# Patient Record
Sex: Male | Born: 1950 | Hispanic: Yes | Marital: Married | Smoking: Former smoker
Health system: Southern US, Community
[De-identification: ages and names within clinical notes are randomized; demographics above are authoritative.]

## PROBLEM LIST (undated history)

## (undated) DIAGNOSIS — F1011 Alcohol abuse, in remission: Secondary | ICD-10-CM

## (undated) DIAGNOSIS — M1990 Unspecified osteoarthritis, unspecified site: Secondary | ICD-10-CM

## (undated) DIAGNOSIS — I10 Essential (primary) hypertension: Secondary | ICD-10-CM

## (undated) DIAGNOSIS — E119 Type 2 diabetes mellitus without complications: Secondary | ICD-10-CM

## (undated) DIAGNOSIS — Z5189 Encounter for other specified aftercare: Secondary | ICD-10-CM

## (undated) HISTORY — DX: Essential (primary) hypertension: I10

## (undated) HISTORY — DX: Encounter for other specified aftercare: Z51.89

## (undated) HISTORY — DX: Alcohol abuse, in remission: F10.11

## (undated) HISTORY — DX: Type 2 diabetes mellitus without complications: E11.9

---

## 2008-11-08 HISTORY — PX: BACK SURGERY: SHX140

## 2014-06-17 ENCOUNTER — Encounter: Payer: Self-pay | Admitting: Medical

## 2014-06-17 ENCOUNTER — Ambulatory Visit (INDEPENDENT_AMBULATORY_CARE_PROVIDER_SITE_OTHER): Payer: Medicare Other | Admitting: Medical

## 2014-06-17 VITALS — BP 140/78 | HR 60 | Temp 98.0°F | Ht 65.0 in | Wt 215.8 lb

## 2014-06-17 DIAGNOSIS — M545 Low back pain, unspecified: Secondary | ICD-10-CM

## 2014-06-17 DIAGNOSIS — M549 Dorsalgia, unspecified: Secondary | ICD-10-CM | POA: Insufficient documentation

## 2014-06-17 DIAGNOSIS — E785 Hyperlipidemia, unspecified: Secondary | ICD-10-CM

## 2014-06-17 DIAGNOSIS — M25552 Pain in left hip: Secondary | ICD-10-CM

## 2014-06-17 DIAGNOSIS — I10 Essential (primary) hypertension: Secondary | ICD-10-CM | POA: Insufficient documentation

## 2014-06-17 DIAGNOSIS — J45909 Unspecified asthma, uncomplicated: Secondary | ICD-10-CM | POA: Insufficient documentation

## 2014-06-17 DIAGNOSIS — M25559 Pain in unspecified hip: Secondary | ICD-10-CM | POA: Insufficient documentation

## 2014-06-17 DIAGNOSIS — E119 Type 2 diabetes mellitus without complications: Secondary | ICD-10-CM

## 2014-06-17 DIAGNOSIS — R350 Frequency of micturition: Secondary | ICD-10-CM

## 2014-06-17 MED ORDER — MELOXICAM 7.5 MG PO TABS
7.5000 mg | ORAL_TABLET | Freq: Every day | ORAL | Status: DC
Start: 2014-06-17 — End: 2014-07-02

## 2014-06-17 NOTE — Assessment & Plan Note (Signed)
Hx of recently but mild and during the day. No infection symptoms obvious. Did get poct ua and psa today. Will follow those results may give antibiotic and may rx med such as flomax.

## 2014-06-17 NOTE — Assessment & Plan Note (Signed)
Continue lipitor and on follow up exam for hip advised to come in fasting and can get lipid panel.

## 2014-06-17 NOTE — Assessment & Plan Note (Signed)
Hx of back pain with hx of surgery. Sometimes back pain associated with lt hip pain so did order lumbar spine xray.

## 2014-06-17 NOTE — Assessment & Plan Note (Signed)
Pt bp is controlled when I rechecked it was 130/70. Continue ace inhibitor.

## 2014-06-17 NOTE — Assessment & Plan Note (Signed)
Pt is on metformin and NPH insulin. Will get cmp and a1-c. Follow closely. And make recommendation when labs back.

## 2014-06-17 NOTE — Progress Notes (Signed)
Subjective:    Patient ID: Samuel [name], male    DOB: 1951-03-30, 63 y.o.   MRN: [id number]  HPI New pt with acute concern. Bilateral hip pain. Effects his balance. He tripped 2 times and fell 3 months ago. He states hurts a lot on left side. Pt states in  got therapy in past with prior MD. Pt states one year ago xray. Pt states lumbar xray. 5 yrs ago. Back surgery and historyof back pain. 3 years pain in hips  Have hurt overall but recently  worse. Pt has used naprosyn in the past. Did not help much and his prior pcp discouraged [place] of nsaids although he never had history of ulcers or gastritis.  Pt has diabetes. Pt states 15 yrs diagnosis. Pt on metfromin. This am 102 bs. Pt states 221. Pt states he can't remember last a1-c. He does not no what that is. Pt states uses NPH 80 units am. 70 units in pm.  Bp 130/78. No Ha, no chest pain and not gross motor/sensory function deficitis.  Pt has hyperlipidemia. He is not fasting today. No side effects reported from lipid med.  He reports history of asthma but no reported wheezing  Pt does not remember any psa. Difficulty urinating occasinally during day but none at night. No fever, no chills, no back or perineum pain.   5 yrs ago had colosocpy. Negative/normal. Last pneumovaccine-?    Review of Systems  Constitutional: Negative for fever, chills and fatigue.  HENT: Negative.   Respiratory: Negative for cough, choking and wheezing.        Hs of asthma but no recent wheezing.  Endocrine: Negative for polydipsia, polyphagia and polyuria.  Genitourinary: Positive for urgency and frequency. Negative for hematuria, decreased urine volume, penile swelling, scrotal swelling, enuresis, difficulty urinating and testicular pain.       Mild sometimes during the day. But not at night.  Musculoskeletal:       Lt hip pain moderate-severe. Rt hip pain mild-moderate. Some chronic low back pain that he experiences sometimes with lt hip pain.  Neurological:  Negative for dizziness, tremors, seizures, syncope, speech difficulty, weakness, light-headedness, numbness and headaches.       Feels off balance acutely. He will have pain and then describes this throws him off balance.  Hematological: Negative for adenopathy. Does not bruise/bleed easily.  Psychiatric/Behavioral: Negative.        Objective:   Physical Exam  Constitutional: He is oriented to person, place, and time. He appears well-developed and well-nourished. No distress.  Pleasant friendly pt. Obese.  HENT:  Head: Normocephalic and atraumatic.  Eyes: Conjunctivae are normal. Pupils are equal, round, and reactive to light.  Neck: Normal range of motion. Neck supple. No JVD present. No tracheal deviation present. No thyromegaly present.  Cardiovascular: Normal rate, regular rhythm and normal heart sounds.  Exam reveals no gallop and no friction rub.   No murmur heard. Pulmonary/Chest: Effort normal and breath sounds normal. No stridor. No respiratory distress. He has no wheezes. He has no rales. He exhibits no tenderness.  Abdominal: Soft. Bowel sounds are normal. He exhibits no distension and no mass. There is no tenderness. There is no rebound and no guarding.  He does have protuberant abdomen.  Genitourinary: Rectum normal, prostate normal and penis normal. No penile tenderness.  Musculoskeletal: Normal range of motion. He exhibits tenderness. He exhibits no edema.  Mild mid lspine pain on palpation. Lt hip- pain on rom. No crepitus. Rt hip- full rom.  No obvious pain.  Lymphadenopathy:    He has no cervical adenopathy.  Neurological: He is alert and oriented to person, place, and time. He has normal reflexes. No cranial nerve deficit. Coordination normal.  Skin: He is not diaphoretic.            Assessment & Plan:

## 2014-06-17 NOTE — Patient Instructions (Addendum)
For your history of back pain and hip pain will get lumbar spine xray and xray of lt hip. Will give prescription of diclofenac for your hip pain.(Stop otc nsaids.) For your frequent urination I want to get a psa. And get urine dip. When labs are back may prescribe flomax. For your diabetes we will check labs today. Please sign release of information form today so we can get old records. Follow up in 2 wks or as needed.

## 2014-06-17 NOTE — Assessment & Plan Note (Signed)
Has pain on both sides but lt is more prominent than right side. So did get xray of that side today.

## 2014-06-17 NOTE — Assessment & Plan Note (Addendum)
Hx of asthma. Recently stable. New pt. Will follow and see if needs inhalers.

## 2014-06-18 LAB — CBC WITH DIFFERENTIAL/PLATELET
BASOS ABS: 0 10*3/uL (ref 0.0–0.1)
BASOS PCT: 0.4 % (ref 0.0–3.0)
EOS ABS: 0.1 10*3/uL (ref 0.0–0.7)
Eosinophils Relative: 1.8 % (ref 0.0–5.0)
HCT: 41.6 % (ref 39.0–52.0)
Hemoglobin: 14.1 g/dL (ref 13.0–17.0)
Lymphocytes Relative: 31.7 % (ref 12.0–46.0)
Lymphs Abs: 2.3 10*3/uL (ref 0.7–4.0)
MCHC: 34 g/dL (ref 30.0–36.0)
MCV: 87.4 fl (ref 78.0–100.0)
MONO ABS: 0.5 10*3/uL (ref 0.1–1.0)
Monocytes Relative: 6.6 % (ref 3.0–12.0)
NEUTROS ABS: 4.3 10*3/uL (ref 1.4–7.7)
NEUTROS PCT: 59.5 % (ref 43.0–77.0)
Platelets: 211 10*3/uL (ref 150.0–400.0)
RBC: 4.76 Mil/uL (ref 4.22–5.81)
RDW: 13.7 % (ref 11.5–15.5)
WBC: 7.2 10*3/uL (ref 4.0–10.5)

## 2014-06-18 LAB — COMPREHENSIVE METABOLIC PANEL
ALK PHOS: 56 U/L (ref 39–117)
ALT: 39 U/L (ref 0–53)
AST: 32 U/L (ref 0–37)
Albumin: 3.9 g/dL (ref 3.5–5.2)
BUN: 22 mg/dL (ref 6–23)
CO2: 24 mEq/L (ref 19–32)
CREATININE: 0.7 mg/dL (ref 0.4–1.5)
Calcium: 9.2 mg/dL (ref 8.4–10.5)
Chloride: 101 mEq/L (ref 96–112)
GFR: 113.55 mL/min (ref >60.00)
Glucose, Bld: 181 mg/dL — ABNORMAL HIGH (ref 70–99)
Potassium: 4.2 mEq/L (ref 3.5–5.1)
Sodium: 138 mEq/L (ref 135–145)
Total Bilirubin: 0.5 mg/dL (ref 0.2–1.2)
Total Protein: 6.7 g/dL (ref 6.0–8.3)

## 2014-06-18 LAB — POCT URINALYSIS DIPSTICK
BILIRUBIN UA: NEGATIVE
Blood, UA: NEGATIVE
Ketones, UA: NEGATIVE
Leukocytes, UA: NEGATIVE
NITRITE UA: NEGATIVE
Protein, UA: NEGATIVE
Spec Grav, UA: 1.02
Urobilinogen, UA: 0.2
pH, UA: 6.5

## 2014-06-18 LAB — HEMOGLOBIN A1C: Hgb A1c MFr Bld: 9.7 % — ABNORMAL HIGH (ref 4.6–6.5)

## 2014-06-18 LAB — PSA: PSA: 0.32 ng/mL (ref 0.10–4.00)

## 2014-06-24 ENCOUNTER — Telehealth: Payer: Self-pay | Admitting: Medical

## 2014-06-24 NOTE — Telephone Encounter (Signed)
I did talk with patient and his daughter. When talked with patient had bad reception. So when daughter called back I explained labs. Particularly expalined a1-c high. I advised to have better diet(Low blood sugar) and exercise. He is on nph 80 units insulin q am and 70 units q pm. Advised can increase insulin  slowly by one unit in am and one pm each day. But continue to check  bs to see if coming down. I advised that if on records his a1-c has always been this high in past consistently then would refer him to endocrinology. Pt will come in about 7-10 days regarding his prior complaint.

## 2014-07-02 ENCOUNTER — Ambulatory Visit (INDEPENDENT_AMBULATORY_CARE_PROVIDER_SITE_OTHER): Payer: Medicare Other | Admitting: Medical

## 2014-07-02 ENCOUNTER — Encounter: Payer: Self-pay | Admitting: Medical

## 2014-07-02 VITALS — BP 130/70 | HR 62 | Temp 97.9°F | Ht 64.6 in | Wt 214.0 lb

## 2014-07-02 DIAGNOSIS — E119 Type 2 diabetes mellitus without complications: Secondary | ICD-10-CM

## 2014-07-02 DIAGNOSIS — M25559 Pain in unspecified hip: Secondary | ICD-10-CM

## 2014-07-02 DIAGNOSIS — R350 Frequency of micturition: Secondary | ICD-10-CM

## 2014-07-02 DIAGNOSIS — M549 Dorsalgia, unspecified: Secondary | ICD-10-CM

## 2014-07-02 DIAGNOSIS — R29898 Other symptoms and signs involving the musculoskeletal system: Secondary | ICD-10-CM

## 2014-07-02 DIAGNOSIS — M25552 Pain in left hip: Secondary | ICD-10-CM

## 2014-07-02 DIAGNOSIS — M5489 Other dorsalgia: Secondary | ICD-10-CM

## 2014-07-02 MED ORDER — MELOXICAM 7.5 MG PO TABS: 7.5000 mg | ORAL_TABLET | Freq: Every day | ORAL | Status: DC

## 2014-07-02 NOTE — Progress Notes (Signed)
Pre visit review using our clinic review tool, if applicable. No additional management support is needed unless otherwise documented below in the visit note. 

## 2014-07-02 NOTE — Assessment & Plan Note (Addendum)
Pt never got lumbar xray. I saw his prior MD notes. Received some old records. November 2014 MRI ordered. But I did not find report. With recent events of daily back pain with losing complete strength  In legs and collapsing will go ahead and order MRI. Continue meloxicam.

## 2014-07-02 NOTE — Assessment & Plan Note (Signed)
Recently improved. PSA was normal.

## 2014-07-02 NOTE — Patient Instructions (Signed)
For you left hip pain I want you to get xray of left hip. Continue on meloxicam. For your back I will try to get old lumbar mri. If I am unable to get then will go ahead and repeat. Continue insulin and diabetic diet. If on repeat a1-c in November your a1-c is above 8 will refer to endocrinologist. Follow up November 10th as needed.

## 2014-07-02 NOTE — Assessment & Plan Note (Signed)
Will get xray of lt hip. Rx of meloxicam.

## 2014-07-02 NOTE — Progress Notes (Signed)
   Subjective:    Patient ID: Samuel [name], male    DOB: [birth year], 63 y.o.   MRN: [id number]  HPI   Pt states last month had 2 events where his rt leg felt very weak. No numbness to legs. No back pain when this happens. One time in [place]  12 years ago randomly fell down. No history of incontinence. No report of saddle anesthesia.   Lt hip pain some crepitus sound per pt. Pain is minimal but often present.   He has lower back lumbar pain with pain day and night. Pain radiating to lt leg. Hard to sleep due to pain. Pt states mri one yr or more ago and told good mri.   Pt states using insulin 80 units am.70 unitis pm.He admits he not eating real healthy. Pt is concerned about low bs based on one event low bs. I asked him by phone  to incrementally increase insulin by one unit each day for both morning and night doses. He did not do this in fact sometimes he states he does not take his insulin at night.His a1-c was 9.7.   Review of Systems  Constitutional: Negative for fever, chills and fatigue.  HENT: Negative.   Respiratory: Negative for choking, chest tightness and wheezing.   Cardiovascular: Negative for chest pain and palpitations.  Genitourinary: Negative for urgency, frequency, flank pain, discharge, difficulty urinating, penile pain and testicular pain.  Musculoskeletal: Positive for back pain.       Lt hip pain  Skin: Negative.   Neurological: Positive for weakness.       Random leg weakness.  Hematological: Negative for adenopathy. Does not bruise/bleed easily.           Objective:   Physical Exam  Constitutional: He is oriented to person, place, and time. He appears well-developed and well-nourished. No distress.  Pleasant friendly pt. Obese.  HENT:  Head: Normocephalic and atraumatic.  Eyes: Conjunctivae are normal. Pupils are equal, round, and reactive to light.  Neck: Normal range of motion. Neck supple. No JVD present. No tracheal deviation present. No  thyromegaly present.  Cardiovascular: Normal rate, regular rhythm and normal heart sounds.  Exam reveals no gallop and no friction rub.   No murmur heard. Pulmonary/Chest: Effort normal and breath sounds normal. No stridor. No respiratory distress. He has no wheezes. He has no rales. He exhibits no tenderness.  Abdominal: Soft. Bowel sounds are normal. He exhibits no distension and no mass. There is no tenderness. There is no rebound and no guarding.  He does have protuberant abdomen.  Musculoskeletal: Normal range of motion. He exhibits tenderness. He exhibits no edema.  Mild mid lspine pain on palpation. Lt hip- pain on rom. No crepitus. Rt hip- full rom. No obvious pain.  Lymphadenopathy:    He has no cervical adenopathy.  Neurological: He is alert and oriented to person, place, and time. He has normal reflexes. No cranial nerve deficit. Coordination normal.  L5-s1 sensation intact. Normal patellar reflexes. Equal 5/5 lower extremity strength. No foot drop.  Skin: He is not diaphoretic.         Assessment & Plan:

## 2014-07-02 NOTE — Assessment & Plan Note (Signed)
Pt admits not compliant with diet although in past he went to see 2 nutritionist so he states he knows how to eat just does not eat well. He also admits not taking insulin as prior pcp advised. I explained to him that if a1-c not controlled in early November that I would refer him to endocrinologist. I discussed idea of using basal insulin at night and meal time sliding and he expressed dislike with that plan.

## 2014-07-09 ENCOUNTER — Other Ambulatory Visit: Payer: Self-pay

## 2014-07-10 ENCOUNTER — Telehealth: Payer: Self-pay | Admitting: Medical

## 2014-07-10 DIAGNOSIS — E119 Type 2 diabetes mellitus without complications: Secondary | ICD-10-CM

## 2014-07-10 NOTE — Telephone Encounter (Signed)
I have reviewed pt records in the past. His a1-c appears to have not been below 8 since 2012. Sometimes a1-c above 10 and usually in high 8's close to 9. Also some history of sporadic hypoglycemic events. Recent a1-c with [place] was 9.7. He is on high units of insulin as well. I discussed this with him today and explained I think referral to endocrinologist is best. I was agreeable to this. So I will go ahead and make that referral.

## 2014-07-22 ENCOUNTER — Ambulatory Visit (HOSPITAL_BASED_OUTPATIENT_CLINIC_OR_DEPARTMENT_OTHER)
Admission: RE | Admit: 2014-07-22 | Discharge: 2014-07-22 | Disposition: A | Discharge status: Home / Self Care | Payer: Medicare Other | Source: Ambulatory Visit | Attending: Medical | Admitting: Medical

## 2014-07-22 ENCOUNTER — Telehealth: Payer: Self-pay | Admitting: Medical

## 2014-07-22 DIAGNOSIS — M25559 Pain in unspecified hip: Secondary | ICD-10-CM | POA: Diagnosis not present

## 2014-07-22 DIAGNOSIS — M545 Low back pain, unspecified: Secondary | ICD-10-CM | POA: Insufficient documentation

## 2014-07-22 DIAGNOSIS — M25552 Pain in left hip: Secondary | ICD-10-CM

## 2014-07-22 NOTE — Telephone Encounter (Signed)
Will notify pt of lumbar xray results and hip xray when  Notify him on mri appointment.

## 2014-07-24 ENCOUNTER — Ambulatory Visit (INDEPENDENT_AMBULATORY_CARE_PROVIDER_SITE_OTHER): Payer: Medicare Other | Admitting: Endocrinology

## 2014-07-24 ENCOUNTER — Other Ambulatory Visit: Payer: Self-pay | Admitting: *Deleted

## 2014-07-24 ENCOUNTER — Encounter: Payer: Self-pay | Admitting: Endocrinology

## 2014-07-24 ENCOUNTER — Encounter: Payer: Medicare Other | Attending: Endocrinology | Admitting: Nutrition

## 2014-07-24 VITALS — BP 131/74 | HR 75 | Temp 98.1°F | Resp 16 | Ht 64.5 in | Wt 215.0 lb

## 2014-07-24 DIAGNOSIS — E785 Hyperlipidemia, unspecified: Secondary | ICD-10-CM

## 2014-07-24 DIAGNOSIS — Z794 Long term (current) use of insulin: Secondary | ICD-10-CM | POA: Diagnosis not present

## 2014-07-24 DIAGNOSIS — E119 Type 2 diabetes mellitus without complications: Secondary | ICD-10-CM | POA: Diagnosis not present

## 2014-07-24 DIAGNOSIS — E1165 Type 2 diabetes mellitus with hyperglycemia: Principal | ICD-10-CM

## 2014-07-24 DIAGNOSIS — Z713 Dietary counseling and surveillance: Secondary | ICD-10-CM | POA: Diagnosis not present

## 2014-07-24 DIAGNOSIS — I10 Essential (primary) hypertension: Secondary | ICD-10-CM

## 2014-07-24 DIAGNOSIS — E669 Obesity, unspecified: Secondary | ICD-10-CM

## 2014-07-24 DIAGNOSIS — IMO0001 Reserved for inherently not codable concepts without codable children: Secondary | ICD-10-CM

## 2014-07-24 MED ORDER — LIRAGLUTIDE 18 MG/3ML ~~LOC~~ SOPN: PEN_INJECTOR | SUBCUTANEOUS | Status: DC

## 2014-07-24 MED ORDER — INSULIN PEN NEEDLE 31G X 5 MM MISC: Status: DC

## 2014-07-24 NOTE — Patient Instructions (Addendum)
Please check blood sugars at least half the time about 2 hours after any meal including breakfast and lunch and 3-4 times a week as directed on waking up. Please bring blood sugar monitor to each visit  EVENING insulin to be taken 30 minutes before eating preferably. Start taking 30 units tonight and take the same dose regardless of the blood sugar Continue 80 units of insulin before breakfast. If the blood sugar is getting low during the day may reduce the dose to 70 units  Start VICTOZA injection with the sample pen once daily at the same time of the day preferably at bedtime.  Dial the dose to 0.6 mg for the first week.   You may  experience nausea in the first few days which usually gets better the After 1 week increase the dose to 1.2mg  daily if no nausea.    You will feel fullness of the stomach with starting the medication and should try to keep portions of food small.    Walk daily Reduce portions of carbohydrates and high fat foods

## 2014-07-24 NOTE — Patient Instructions (Signed)
Take  Victoza once a day at bedtime Start with 0.6 for 7 days, and if no nausea, increase the dose to 1.2. Read over starter kit on the medications and call if questions. Take evening dose of insulin before the supper meal.

## 2014-07-24 NOTE — Progress Notes (Signed)
Patient ID: Samuel [name], male   DOB: 16-Mar-1951, 63 y.o.   MRN: [id number]            Reason for Appointment: Consultation for Type 2 Diabetes  Referring physician: [place]  History of Present Illness:          Diagnosis: Type 2 diabetes mellitus, date of diagnosis:  1990      Past history: He thinks he has been on insulin for the last 10-12 years. Previously had been on metformin which has been continued. He was probably tried on different insulin regimens initially but has been taking 70/30 mostly because of cost Previous records are not available and appears that his sugars have been poorly controlled for several years  Recent history:  He has been referred here because of an A1c in 8/15 of 9.7%. He does not know what his previous levels have been He is checking his blood sugars and keeping a diary. However checking the blood sugar only when he is taking his insulin twice a day Currently he is taking his insulin just before breakfast in the morning and about an hour after his evening meal He would adjust his evening insulin based on the postprandial blood sugar and will take usually 30-60 units and occasionally will skip the insulin His blood sugars in the mornings are relatively good but usually are high at night and had some fluctuation He has a tendency to low sugars during the night periodically when he would get very shaky and will drink juice       Oral hypoglycemic drugs the patient is taking are:   metformin     Side effects from medications have been:none INSULIN regimen is described as:  Novolin 70/30: 80 acb; 30-60 pcs  Compliance with the medical regimen: Fair Hypoglycemia:  4 am   Glucose monitoring:  done twice a day         Glucometer: One Touch.      Blood Glucose readings by time of day and averages from    PREMEAL Breakfast Lunch pcs Bedtime  Overall   Glucose range: 66-137  174-321    Median:        Self-care: The diet that the patient has been following  is: None, not controlling portions and appears to have relatively high carbohydrate meals especially breakfast    Meals: 3 meals per day. Breakfast is various kinds of bread, milk and eggs         Exercise: Walking about 1 mile, 3 days a week in the evenings           Dietician visit, most recent: 5 years ago.               Weight history: 200-220  Wt Readings from Last 3 Encounters:  07/24/14 215 lb (97.523 kg)  07/02/14 214 lb (97.07 kg)  06/17/14 215 lb 12.8 oz (97.886 kg)    Glycemic control:   Lab Results  Component Value Date   HGBA1C 9.7* 06/17/2014   Lab Results  Component Value Date   CREATININE 0.7 06/17/2014         Medication List       This list is accurate as of: 07/24/14  2:32 PM.  Always use your most recent med list.               atorvastatin 40 MG tablet  Commonly known as:  LIPITOR  Take 40 mg by mouth daily. Take 1/2 tablet at bedtime  cholecalciferol 1000 UNITS tablet  Commonly known as:  VITAMIN D  Take 1,000 Units by mouth daily.     insulin NPH-regular Human (70-30) 100 UNIT/ML injection  Commonly known as:  NOVOLIN 70/30  Inject into the skin 2 (two) times daily with a meal. 80u in morning 70u in the evening     lisinopril 20 MG tablet  Commonly known as:  PRINIVIL,ZESTRIL  Take 20 mg by mouth daily.     meloxicam 7.5 MG tablet  Commonly known as:  MOBIC  Take 1 tablet (7.5 mg total) by mouth daily.     metFORMIN 1000 MG tablet  Commonly known as:  GLUCOPHAGE  Take 1,000 mg by mouth 2 (two) times daily with a meal.        Allergies: No Known Allergies  Past Medical History  Diagnosis Date  . Hypertension   . Diabetes mellitus without complication   . Blood transfusion without reported diagnosis   . History of ETOH abuse     Past Surgical History  Procedure Laterality Date  . Back surgery N/A 2010    Family History  Problem Relation Age of Onset  . Diabetes    . Hypertension    . Arthritis Father   . Diabetes  Father     Social History:  reports that he has quit smoking. He quit smokeless tobacco use about 20 years ago. He reports that he does not drink alcohol or use illicit drugs.    Review of Systems       Vision is normal. Most recent eye exam was in 3/15, reportedly normal       Lipids:  He has been on Lipitor for 3-4 years, no recent labs available       No results found for this basename: CHOL, HDL, LDLCALC, LDLDIRECT, TRIG, CHOLHDL                  Skin: No rash or infections     Thyroid:  No  unusual fatigue.     The blood pressure has been high for 2 years, treated with lisinopril     No swelling of feet.     No shortness of breath or chest tightness  on exertion.     Bowel habits: Normal.      Has had some joint  pains.          No history of Numbness, tingling or burning in feet, occasionally has sensitivity of the top of his feet       Physical Examination:  BP 131/74  Pulse 75  Temp(Src) 98.1 F (36.7 C)  Resp 16  Ht 5' 4.5" (1.638 m)  Wt 215 lb (97.523 kg)  BMI 36.35 kg/m2  SpO2 97%  GENERAL:         Patient has generalized obesity.   HEENT:         Eye exam shows normal external appearance. Fundus exam shows no retinopathy. Oral exam shows normal mucosa .  NECK:         General:  Neck exam shows no lymphadenopathy. Carotids are normal to palpation and no bruit heard.  Thyroid is not enlarged and no nodules felt.   LUNGS:         Chest is symmetrical. Lungs are clear to auscultation.[name]   HEART:         Heart sounds:  S1 and S2 are normal. No murmurs or clicks heard., no S3 or S4.   ABDOMEN:  There is no distention present. Abdomen is obese. Liver and spleen are not palpable. No other mass or tenderness present.  EXTREMITIES:     There is no edema. No skin lesions present.[name]  NEUROLOGICAL:   Vibration sense is moderately reduced in toes. Ankle jerks are absent bilaterally.           Diabetic foot exam shows normal monofilament sensation in the toes and  plantar surfaces, no skin lesions or ulcers on the feet and normal pedal pulses MUSCULOSKELETAL:       There is no enlargement or deformity of the joints. Spine is normal to inspection.[name]   SKIN:       No rash or lesions of concern.        ASSESSMENT:  Diabetes type 2, uncontrolled with obesity and BMI of 36 He is also significantly insulin resistant as he is taking about 150 units of insulin a day with poor control despite taking metformin Discussed that it is important for him to lose weight for multiple benefits especially reduced insulin resistance    Discussed with the patient the nature of GLP-1 drugs, the action on various organ systems, how they benefit blood glucose control, as well as the benefit of weight loss and  increase satiety . Explained possible side effects especially nausea and vomiting; discussed safety information in package insert.  INSULIN doses: Currently he is taking his evening dose inappropriately after eating and adjusting it based on postprandial reading and discussed the actions of premixed insulin and timing of this insulin Also not clear if he may need to split his morning dose to smaller dose at breakfast and lunch Currently he is not monitoring his blood sugars after breakfast and lunch and not clear what his blood sugar patterns are He may also be able to control glucose better by using a basal bolus regimen of Lantus and NovoLog but cost may be a factor  Complications: None evident, needs to have microalbumin checked  History of hyperlipidemia: Needs to be evaluated with lipid levels  HYPERTENSION: Appears well controlled  PLAN:  The nurse educator instructed him on injection technique and dosage titration of Victoza  starting with 0.6 mg once a day at the same time for the first week and then increasing to 1.2 mg if no symptoms of nausea. Patient brochure on Victoza and co-pay card given Increase frequency of walking Start checking blood sugars more  consistently midmorning and midafternoon also periodically Change suppertime dose to 30 minutes before eating and start with 30 units He will bring his monitor for download and review of blood sugar patterns in about 3 weeks He was given written instructions including translation in Spanish Consultation with dietitian  Counseling time over 50% of today's 25 minute visit  Jalan Bodi 07/24/2014, 2:32 PM   Note: This office note was prepared with Dragon voice recognition system technology. Any transcriptional errors that result from this process are unintentional.

## 2014-07-24 NOTE — Progress Notes (Signed)
We discussed how the [place] works and he was shown how to inject this medication.  He has never used a pen, and he redemonstrated how to attach the needle and draw up the dose.  He is injection his insulin into his upper thighs and abdomen area.  We review the different sites he can use to inject this Victoza and the need to rotate those sites daily.  He reported good understanding of this.   We also discussed how to dose this medication.  He was given a Victoza starter kit, with directions on how to increase the dose after 7 day, as well as how to use this pen.   He reported good understanding of this and had no final questions.  He was told to have his daughter give me a call if he, or she has questions about this medication.    We also reviewed the need to take his evening dose of insulin before his supper meal.  He reported good understanding of this.

## 2014-07-27 ENCOUNTER — Ambulatory Visit (HOSPITAL_BASED_OUTPATIENT_CLINIC_OR_DEPARTMENT_OTHER)
Admission: RE | Admit: 2014-07-27 | Discharge: 2014-07-27 | Disposition: A | Discharge status: Home / Self Care | Payer: Medicare Other | Source: Ambulatory Visit | Attending: Medical | Admitting: Medical

## 2014-07-27 DIAGNOSIS — M79609 Pain in unspecified limb: Secondary | ICD-10-CM | POA: Diagnosis not present

## 2014-07-27 DIAGNOSIS — M51379 Other intervertebral disc degeneration, lumbosacral region without mention of lumbar back pain or lower extremity pain: Secondary | ICD-10-CM | POA: Insufficient documentation

## 2014-07-27 DIAGNOSIS — IMO0002 Reserved for concepts with insufficient information to code with codable children: Secondary | ICD-10-CM | POA: Diagnosis not present

## 2014-07-27 DIAGNOSIS — M545 Low back pain, unspecified: Secondary | ICD-10-CM | POA: Diagnosis present

## 2014-07-27 DIAGNOSIS — R29898 Other symptoms and signs involving the musculoskeletal system: Secondary | ICD-10-CM

## 2014-07-27 DIAGNOSIS — M5137 Other intervertebral disc degeneration, lumbosacral region: Secondary | ICD-10-CM | POA: Insufficient documentation

## 2014-07-27 DIAGNOSIS — M25559 Pain in unspecified hip: Secondary | ICD-10-CM | POA: Insufficient documentation

## 2014-07-27 DIAGNOSIS — M5489 Other dorsalgia: Secondary | ICD-10-CM

## 2014-07-27 DIAGNOSIS — M47817 Spondylosis without myelopathy or radiculopathy, lumbosacral region: Secondary | ICD-10-CM | POA: Diagnosis not present

## 2014-08-02 ENCOUNTER — Telehealth: Payer: Self-pay | Admitting: Medical

## 2014-08-02 NOTE — Telephone Encounter (Signed)
I discussed lumbar mri results with him. He is reporting knee pain recently and sometimes legs feel week. His back really is not bothering him recently. I offered appointment and he willing to come in. Will talk with receptionist and schedule him for next week. Will have spanish speaking staff call and notify him appointment date.

## 2014-08-03 ENCOUNTER — Ambulatory Visit (HOSPITAL_BASED_OUTPATIENT_CLINIC_OR_DEPARTMENT_OTHER): Payer: Medicare Other

## 2014-08-08 ENCOUNTER — Ambulatory Visit: Payer: Medicare Other | Admitting: Medical

## 2014-08-08 ENCOUNTER — Telehealth: Payer: Self-pay | Admitting: *Deleted

## 2014-08-08 NOTE — Telephone Encounter (Signed)
Pt did not show for appointment 08/08/2014 at 9am for knee pain

## 2014-08-14 ENCOUNTER — Other Ambulatory Visit: Payer: Medicare Other

## 2014-08-19 ENCOUNTER — Encounter: Payer: Self-pay | Admitting: Endocrinology

## 2014-08-19 ENCOUNTER — Ambulatory Visit (INDEPENDENT_AMBULATORY_CARE_PROVIDER_SITE_OTHER): Payer: Medicare Other | Admitting: Endocrinology

## 2014-08-19 VITALS — BP 140/74 | HR 78 | Temp 98.0°F | Resp 16 | Ht 64.5 in | Wt 211.6 lb

## 2014-08-19 DIAGNOSIS — IMO0002 Reserved for concepts with insufficient information to code with codable children: Secondary | ICD-10-CM

## 2014-08-19 DIAGNOSIS — Z23 Encounter for immunization: Secondary | ICD-10-CM

## 2014-08-19 DIAGNOSIS — E1165 Type 2 diabetes mellitus with hyperglycemia: Secondary | ICD-10-CM

## 2014-08-19 DIAGNOSIS — E785 Hyperlipidemia, unspecified: Secondary | ICD-10-CM

## 2014-08-19 DIAGNOSIS — I10 Essential (primary) hypertension: Secondary | ICD-10-CM

## 2014-08-19 NOTE — Patient Instructions (Addendum)
Victoza 1.2mg  daily  INSULIN 85 UNITS AND ONLY REDUCE to 75 units if am sugar < 90  Take 55 units before dinner daily  Please check blood sugars at least half the time about 2 hours after any meal and times per week on waking up. Please bring blood sugar monitor to each visit  Call if sugar getting low

## 2014-08-19 NOTE — Progress Notes (Signed)
Patient ID: Samuel [name], male   DOB: 01/06/1951, 63 y.o.   MRN: [id number]            Reason for Appointment:  Followup for Type 2 Diabetes  Referring physician: [place]  History of Present Illness:          Diagnosis: Type 2 diabetes mellitus, date of diagnosis:  1990      Past history: He thinks he has been on insulin for the last 10-12 years. Previously had been on metformin which has been continued. He was probably tried on different insulin regimens initially but has been taking 70/30 mostly because of cost Previous records are not available and appears that his sugars have been poorly controlled for several years  Recent history:  He has been referred here because of an A1c in 8/15 of 9.7%. He had been on a regimen of Novolin 70/30 twice a day but was taking his evening dose based on the postprandial blood sugar He would also not take his insulin if his blood sugars would be normal or relatively low He would tend to have fluctuating blood sugars after dinner but occasional nocturnal hypoglycemia He is checking his blood sugars with a generic monitor and keeping a diary.   He was given instructions on how to take his insulin but he has not followed this. Still taking his evening insulin based on blood sugar before eating and also may sometimes give the morning dose He was started on Victoza because of his obesity and poor control but he is taking only 0.6 mg despite instructions in Spanish No recent labs are available       Oral hypoglycemic drugs the patient is taking are:   metformin     Side effects from medications have been:none INSULIN regimen is described as:  Novolin 70/30: 80 acb; 60-70 acs  Compliance with the medical regimen: Fair Hypoglycemia:   twice in the last month  Glucose monitoring:  done twice a day         Glucometer:  True Result     Blood Glucose readings by time of day by review of blood sugar diary  PREMEAL Breakfast Lunch pcs Bedtime  Overall     Glucose range: 89-193  94-214 163   Median:        Self-care: The diet that the patient has been following is: None, not controlling portions and appears to have relatively high carbohydrate meals especially breakfast    Meals: 3 meals per day. Breakfast is various kinds of bread, milk and eggs         Exercise: Walking about 1 mile, 3 days a week in the evenings           Dietician visit, most recent: 5 years ago.               Weight history: 200-220  Wt Readings from Last 3 Encounters:  08/19/14 211 lb 9.6 oz (95.981 kg)  07/24/14 215 lb (97.523 kg)  07/02/14 214 lb (97.07 kg)    Glycemic control:   Lab Results  Component Value Date   HGBA1C 9.7* 06/17/2014   Lab Results  Component Value Date   CREATININE 0.7 06/17/2014         Medication List       This list is accurate as of: 08/19/14  3:41 PM.  Always use your most recent med list.               atorvastatin 40 MG  tablet  Commonly known as:  LIPITOR  Take 40 mg by mouth daily. Take 1/2 tablet at bedtime     cholecalciferol 1000 UNITS tablet  Commonly known as:  VITAMIN D  Take 1,000 Units by mouth daily.     insulin NPH-regular Human (70-30) 100 UNIT/ML injection  Commonly known as:  NOVOLIN 70/30  Inject into the skin 2 (two) times daily with a meal. 80u in morning 70u in the evening     Insulin Pen Needle 31G X 5 MM Misc  Use once daily with victoza     Liraglutide 18 MG/3ML Sopn  Commonly known as:  VICTOZA  Inject 0.6 mg daily for the first week, then increase to 1.2 daily     lisinopril 20 MG tablet  Commonly known as:  PRINIVIL,ZESTRIL  Take 20 mg by mouth daily.     meloxicam 7.5 MG tablet  Commonly known as:  MOBIC  Take 1 tablet (7.5 mg total) by mouth daily.     metFORMIN 1000 MG tablet  Commonly known as:  GLUCOPHAGE  Take 1,000 mg by mouth 2 (two) times daily with a meal.        Allergies: No Known Allergies  Past Medical History  Diagnosis Date  . Hypertension   .  Diabetes mellitus without complication   . Blood transfusion without reported diagnosis   . History of ETOH abuse     Past Surgical History  Procedure Laterality Date  . Back surgery N/A 2010    Family History  Problem Relation Age of Onset  . Diabetes    . Hypertension    . Arthritis Father   . Diabetes Father     Social History:  reports that he has quit smoking. He quit smokeless tobacco use about 20 years ago. He reports that he does not drink alcohol or use illicit drugs.    Review of Systems       Vision is normal. Most recent eye exam was in 3/15, reportedly normal       Lipids:  He has been on Lipitor for 3-4 years, no recent labs available       No results found for this basename: CHOL,  HDL,  LDLCALC,  LDLDIRECT,  TRIG,  CHOLHDL       The blood pressure has been high for 2 years, treated with lisinopril          No history of Numbness, tingling or burning in feet, occasionally has sensitivity of the top of his feet      Physical Examination:  BP 140/74  Pulse 78  Temp(Src) 98 F (36.7 C)  Resp 16  Ht 5' 4.5" (1.638 m)  Wt 211 lb 9.6 oz (95.981 kg)  BMI 35.77 kg/m2  SpO2 97%        ASSESSMENT:  Diabetes type 2, uncontrolled with obesity and BMI of 36 Although his blood sugars appear to be relatively better he is checking them primarily before eating only He was given detailed instructions on glucose monitoring, timing of insulin and adjustment as well as titration of Victoza dose but he has not followed these instructions Still taking large doses of premixed insulin twice a day  HYPERTENSION: Well controlled  Hyperlipidemia: Needs followup lipids, this will be done on his followup visit   PLAN:  He was advised him to titration of Victoza and possible side effects Reduce his evening dose Advised him to keep his insulin doses consistent unless he is having hypoglycemia especially  early morning and review of blood sugars again on followup  visit He will not skip his insulin doses if blood sugars are normal More regular exercise He was advised to come back in one month but he cannot afford this and will come back in 2 months Check microalbumin on the next visit  He will start using the One Touch monitor and not in a fasting and postprandial readings He will bring his monitor for download and review of blood sugar patterns in about 3 weeks He was given written instructions including translation in Spanish Consultation with dietitian  needs to be done  Patient Instructions  Victoza 1.2mg  daily  INSULIN 85 UNITS AND ONLY REDUCE to 75 units if am sugar < 90  Take 55 units before dinner daily  Please check blood sugars at least half the time about 2 hours after any meal and times per week on waking up. Please bring blood sugar monitor to each visit  Call if sugar getting low    Counseling time over 50% of today's 25 minute visit  Meryem Haertel 08/19/2014, 3:41 PM   Note: This office note was prepared with Insurance underwriterDragon voice recognition system technology. Any transcriptional errors that result from this process are unintentional.

## 2014-09-03 ENCOUNTER — Telehealth: Payer: Self-pay | Admitting: Endocrinology

## 2014-09-03 ENCOUNTER — Other Ambulatory Visit: Payer: Self-pay | Admitting: *Deleted

## 2014-09-03 MED ORDER — "INSULIN SYRINGE-NEEDLE U-100 30G X 5/16"" 0.5 ML MISC": Status: DC

## 2014-09-03 NOTE — Telephone Encounter (Signed)
Patient need refill of insulin syringes, Small needles

## 2014-10-02 ENCOUNTER — Ambulatory Visit (INDEPENDENT_AMBULATORY_CARE_PROVIDER_SITE_OTHER): Payer: Medicare Other | Admitting: Medical

## 2014-10-02 ENCOUNTER — Encounter: Payer: Self-pay | Admitting: Medical

## 2014-10-02 VITALS — BP 144/79 | HR 66 | Temp 98.3°F | Ht 64.0 in | Wt 214.4 lb

## 2014-10-02 DIAGNOSIS — I10 Essential (primary) hypertension: Secondary | ICD-10-CM

## 2014-10-02 DIAGNOSIS — E785 Hyperlipidemia, unspecified: Secondary | ICD-10-CM

## 2014-10-02 DIAGNOSIS — M5410 Radiculopathy, site unspecified: Secondary | ICD-10-CM

## 2014-10-02 DIAGNOSIS — M5442 Lumbago with sciatica, left side: Secondary | ICD-10-CM

## 2014-10-02 DIAGNOSIS — M792 Neuralgia and neuritis, unspecified: Secondary | ICD-10-CM

## 2014-10-02 DIAGNOSIS — E119 Type 2 diabetes mellitus without complications: Secondary | ICD-10-CM

## 2014-10-02 MED ORDER — TRAMADOL HCL 50 MG PO TABS: 50.0000 mg | ORAL_TABLET | Freq: Three times a day (TID) | ORAL | Status: DC | PRN

## 2014-10-02 NOTE — Patient Instructions (Addendum)
For your diabetes. Continue to see the endocrinologist.  For your lipids, check cmp and lipid panel on Monday fasting. Will refill your lipid medication according to results.  For your back pain, I am prescribing tramadol. Also referring to neurosurgeon and pain management.  For your blood pressure check daily. If your average is above 140/90 then would need to adjust your bp medication dose  Follow up 3 months or as needed.

## 2014-10-02 NOTE — Assessment & Plan Note (Signed)
Patient is being seen by a endocrinologist. He is on metformin and insulin. I did do a diabetic foot exam today. And he will follow-up in the next couple weeks with the endocrinologist. No changes made today.

## 2014-10-02 NOTE — Progress Notes (Signed)
Pre visit review using our clinic review tool, if applicable. No additional management support is needed unless otherwise documented below in the visit note. 

## 2014-10-02 NOTE — Assessment & Plan Note (Signed)
Patient has a blood pressure cuff and asked him to check his blood pressure daily. If his blood pressure readings are 140/90 or higher then he will notify me and we will adjust his blood pressure medication. He'll continue his current regimen.

## 2014-10-02 NOTE — Assessment & Plan Note (Signed)
Some disc bulge in the lumbar spine region with some impingement. Since he does have daily pain that radiates to his left lower extremity I will go ahead and make referral to both pain management and neurosurgeon. Patient expresses a does not want surgery. But I do want to get a surgeon opinion since I believe he had prior surgery years ago.

## 2014-10-02 NOTE — Assessment & Plan Note (Signed)
I put an order for fasting lipid panel and CMP. He will get that done on Monday and I will review the labs and make some adjustment is on medications if needed.

## 2014-10-02 NOTE — Progress Notes (Signed)
   Subjective:    Patient ID: Samuel SalinesLuis Vanmeter, male    DOB: [birth year], 63 y.o.   MRN: [id number]  HPI  Pt in for follow up.  Pt is still going to endocrinologist. Also next month is go see the endocrinologist again. Recent bs values wheh he check his blood sugars are in the 100's.  Pt bp is slight high today. Pt has machine at home but never checks. Pt not sure who goal is. I advised him today I want his 130/80. He also admits to eating a lot of of salt and 4-5 cups coffee a day.  Pt still has pain in his left leg. Lower back/si area pain all the way to calf at times. Pt has some pain that is moderate uncomfortable daily bais. Prior surgery to his back. Pt does not want surgery again.  Patient has history of hyperlipidemia and he is not fasting today.    Review of Systems  Constitutional: Negative for fever, chills and fatigue.  HENT: Negative for congestion, ear discharge, ear pain, nosebleeds, postnasal drip, rhinorrhea, sinus pressure, sneezing, sore throat and trouble swallowing.   Respiratory: Negative for cough, chest tightness, shortness of breath and wheezing.   Cardiovascular: Negative for chest pain and palpitations.  Gastrointestinal: Negative for nausea, abdominal pain, diarrhea and rectal pain.  Endocrine: Negative for polydipsia, polyphagia and polyuria.  Musculoskeletal: Positive for back pain.  Neurological: Negative for dizziness, tremors, seizures, syncope, facial asymmetry, weakness, light-headedness, numbness and headaches.       Some radiating pain from his left SI region down to the lateral aspect of his left calf.  Hematological: Negative for adenopathy. Does not bruise/bleed easily.  Psychiatric/Behavioral: Negative for suicidal ideas, behavioral problems, self-injury and dysphoric mood. The patient is not nervous/anxious.        Objective:   Physical Exam   General Mental Status- Alert. General Appearance- Not in acute distress.   Skin General: Color-  Normal Color. Moisture- Normal Moisture.  Neck Carotid Arteries- Normal color. Moisture- Normal Moisture. No carotid bruits. No JVD.  Chest and Lung Exam Auscultation: Breath Sounds:-Normal.  Cardiovascular Auscultation:Rythm- Regular. Murmurs & Other Heart Sounds:Auscultation of the heart reveals- No Murmurs.  Abdomen Inspection:-Inspeection Normal. But has obese protuberant abdomen. Palpation/Percussion:Note:No mass. Palpation and Percussion of the abdomen reveal- Non Tender, Non Distended + BS, no rebound or guarding.    Neurologic Cranial Nerve exam:- CN III-XII intact(No nystagmus), symmetric smile. Drift Test:- No drift. Romberg Exam:- Negative.  Heal to Toe Gait exam:-Normal. Finger to Nose:- Normal/Intact Strength:- 5/5 equal and symmetric strength both upper and lower extremities. Feet normal. Lt si tender.  Lower extremity neuro-L5-S1 sensation intact bilaterally. Normal patellar reflexes. No foot drop.  Back-mild mid lumbar sacral tenderness to palpation. Left SI area is tender to palpation as well.          Assessment & Plan:

## 2014-10-09 ENCOUNTER — Other Ambulatory Visit: Payer: Medicare Other

## 2014-10-11 ENCOUNTER — Other Ambulatory Visit (INDEPENDENT_AMBULATORY_CARE_PROVIDER_SITE_OTHER): Payer: Medicare Other

## 2014-10-11 DIAGNOSIS — E785 Hyperlipidemia, unspecified: Secondary | ICD-10-CM

## 2014-10-13 LAB — COMPREHENSIVE METABOLIC PANEL
ALT: 32 U/L (ref 0–53)
AST: 33 U/L (ref 0–37)
Albumin: 4 g/dL (ref 3.5–5.2)
Alkaline Phosphatase: 40 U/L (ref 39–117)
BUN: 15 mg/dL (ref 6–23)
CO2: 26 mEq/L (ref 19–32)
Calcium: 9.3 mg/dL (ref 8.4–10.5)
Chloride: 101 mEq/L (ref 96–112)
Creatinine, Ser: 0.9 mg/dL (ref 0.4–1.5)
GFR: 94.11 mL/min (ref >60.00)
Glucose, Bld: 166 mg/dL — ABNORMAL HIGH (ref 70–99)
Potassium: 4.4 mEq/L (ref 3.5–5.1)
SODIUM: 138 meq/L (ref 135–145)
TOTAL PROTEIN: 6.8 g/dL (ref 6.0–8.3)
Total Bilirubin: 0.8 mg/dL (ref 0.2–1.2)

## 2014-10-13 LAB — LIPID PANEL
CHOL/HDL RATIO: 2
Cholesterol: 99 mg/dL (ref 0–200)
HDL: 43.9 mg/dL (ref >39.00)
LDL Cholesterol: 32 mg/dL (ref 0–99)
NONHDL: 55.1
Triglycerides: 115 mg/dL (ref 0.0–149.0)
VLDL: 23 mg/dL (ref 0.0–40.0)

## 2014-10-14 ENCOUNTER — Telehealth: Payer: Self-pay | Admitting: Medical

## 2014-10-14 MED ORDER — ATORVASTATIN CALCIUM 40 MG PO TABS: ORAL_TABLET | ORAL | Status: DC

## 2014-10-14 NOTE — Telephone Encounter (Signed)
I called pt with lab results cholesterol. No answer. So left message stating his cholesterol levels are very good. I will send rx to his pharmacy/refill lipitor same dose.

## 2014-10-15 ENCOUNTER — Encounter: Payer: Medicare Other | Attending: Endocrinology | Admitting: Nutrition

## 2014-10-15 ENCOUNTER — Other Ambulatory Visit: Payer: Self-pay | Admitting: *Deleted

## 2014-10-15 ENCOUNTER — Encounter: Payer: Self-pay | Admitting: Endocrinology

## 2014-10-15 ENCOUNTER — Ambulatory Visit (INDEPENDENT_AMBULATORY_CARE_PROVIDER_SITE_OTHER): Payer: Medicare Other | Admitting: Endocrinology

## 2014-10-15 VITALS — BP 148/82 | HR 60 | Temp 98.5°F | Resp 14 | Ht 64.0 in | Wt 212.8 lb

## 2014-10-15 DIAGNOSIS — E119 Type 2 diabetes mellitus without complications: Secondary | ICD-10-CM | POA: Insufficient documentation

## 2014-10-15 DIAGNOSIS — E785 Hyperlipidemia, unspecified: Secondary | ICD-10-CM

## 2014-10-15 DIAGNOSIS — E669 Obesity, unspecified: Secondary | ICD-10-CM | POA: Insufficient documentation

## 2014-10-15 DIAGNOSIS — E1165 Type 2 diabetes mellitus with hyperglycemia: Secondary | ICD-10-CM | POA: Insufficient documentation

## 2014-10-15 DIAGNOSIS — I10 Essential (primary) hypertension: Secondary | ICD-10-CM | POA: Insufficient documentation

## 2014-10-15 DIAGNOSIS — IMO0002 Reserved for concepts with insufficient information to code with codable children: Secondary | ICD-10-CM

## 2014-10-15 DIAGNOSIS — Z713 Dietary counseling and surveillance: Secondary | ICD-10-CM | POA: Insufficient documentation

## 2014-10-15 DIAGNOSIS — Z794 Long term (current) use of insulin: Secondary | ICD-10-CM | POA: Insufficient documentation

## 2014-10-15 DIAGNOSIS — Z79899 Other long term (current) drug therapy: Secondary | ICD-10-CM | POA: Insufficient documentation

## 2014-10-15 DIAGNOSIS — Z7982 Long term (current) use of aspirin: Secondary | ICD-10-CM | POA: Insufficient documentation

## 2014-10-15 DIAGNOSIS — Z6836 Body mass index (BMI) 36.0-36.9, adult: Secondary | ICD-10-CM | POA: Insufficient documentation

## 2014-10-15 MED ORDER — INSULIN GLARGINE 300 UNIT/ML ~~LOC~~ SOPN: 80.0000 [IU] | PEN_INJECTOR | Freq: Every day | SUBCUTANEOUS | Status: DC

## 2014-10-15 MED ORDER — INSULIN NPH (HUMAN) (ISOPHANE) 100 UNIT/ML ~~LOC~~ SUSP: SUBCUTANEOUS | Status: DC

## 2014-10-15 MED ORDER — EXENATIDE ER 2 MG ~~LOC~~ SUSR: 2.0000 mg | SUBCUTANEOUS | Status: DC

## 2014-10-15 NOTE — Progress Notes (Signed)
Patient ID: Samuel [name], male   DOB: Jun 10, 1951, 63 y.o.   MRN: [id number]            Reason for Appointment:  Followup for Type 2 Diabetes  Referring physician: [place]  History of Present Illness:          Diagnosis: Type 2 diabetes mellitus, date of diagnosis:  1990      Past history: He thinks he has been on insulin for the last 10-12 years. Previously had been on metformin which has been continued. He was probably tried on different insulin regimens initially but has been taking 70/30 mostly because of cost Previous records are not available and appears that his sugars have been poorly controlled for several years He has been referred here because of an A1c in 8/15 of 9.7%.  Recent history:  He  Has been on a regimen of Novolin 70/30 twice a day  For simplicity and lower-cost  However even with increasing his  Doses and making sure he takes the insulin about 30 minutes before eating his blood sugars are still not controlled  He has been checking blood sugars mostly in the mornings.    Although he has now started using the One Touch monitor he has done readings after lunch and supper more sporadically.  He is only entering the fasting blood sugars and his diarrhea recently   Fasting blood sugars are usually excellent but he has readings almost always over 200 after meals and blood sugars appear to be progressively high in the afternoon and evening  He was started on Victoza because of his obesity and poor control but he did not increase the dose beyond 0.6 as instructed on the last visit. He thinks that he has dizziness with this medication although not everyday and some nausea.  Is not taking this now   He has difficulty losing weight despite doing some walking       Oral hypoglycemic drugs the patient is taking are:   metformin 1 g twice a day     Side effects from medications have been: ?  Nausea/dizziness on Victoza INSULIN regimen is described as:  Novolin 70/30: 80 acb; 60-70  acs  Compliance with the medical regimen: Fair Hypoglycemia:  twice in the last month  Glucose monitoring:  done twice a day         Glucometer:  True Result     Blood Glucose readings by time of day by review of blood sugar diary  PRE-MEAL Breakfast  9-10 AM  Dinner Bedtime Overall  Glucose range:  62-166   164-259   164-293   225-373    Mean/median:  118     270   164    Self-care: The diet that the patient has been following is: None, not controlling portions and may have carbohydrate meals especially breakfast    Meals: 3 meals per day. Breakfast is various kinds of bread, milk and eggs         Exercise: Walking about 1 mile, 3 days a week in the evenings           Dietician visit, most recent: 5 years ago.               Weight history: 200-220  Wt Readings from Last 3 Encounters:  10/15/14 212 lb 12.8 oz (96.525 kg)  10/02/14 214 lb 6.4 oz (97.251 kg)  08/19/14 211 lb 9.6 oz (95.981 kg)    Glycemic control:   Lab Results  Component Value Date   HGBA1C 9.7* 06/17/2014   Lab Results  Component Value Date   LDLCALC 32 10/11/2014   CREATININE 0.9 10/11/2014         Medication List       This list is accurate as of: 10/15/14 11:59 PM.  Always use your most recent med list.               aspirin 325 MG tablet  Take 325 mg by mouth daily.     atorvastatin 40 MG tablet  Commonly known as:  LIPITOR  Take 40 mg by mouth daily. Take 1/2 tablet at bedtime     cholecalciferol 1000 UNITS tablet  Commonly known as:  VITAMIN D  Take 1,000 Units by mouth daily.     Vitamin D3 1000 UNITS Caps     Exenatide ER 2 MG Susr  Commonly known as:  BYDUREON  Inject 2 mg into the skin once a week.     Insulin Glargine 300 UNIT/ML Sopn  Commonly known as:  TOUJEO SOLOSTAR  Inject 80 Units into the skin daily. 80 units daily, every 5 days go up 5 units if blood sugar over 130     insulin NPH Human 100 UNIT/ML injection  Commonly known as:  HUMULIN N  Inject 25 units at  breakfast, 35 units at lunch and 30 units at dinner     Insulin Pen Needle 31G X 5 MM Misc  Use once daily with victoza     Insulin Syringe-Needle U-100 30G X 5/16" 0.5 ML Misc  Use to inject insulin     INSULIN SYRINGE 1CC/30GX5/16" 30G X 5/16" 1 ML Misc     Liraglutide 18 MG/3ML Sopn  Commonly known as:  VICTOZA  Inject 0.6 mg daily for the first week, then increase to 1.2 daily     lisinopril 20 MG tablet  Commonly known as:  PRINIVIL,ZESTRIL  Take 20 mg by mouth daily.     meloxicam 7.5 MG tablet  Commonly known as:  MOBIC     metFORMIN 1000 MG tablet  Commonly known as:  GLUCOPHAGE  Take 1,000 mg by mouth 2 (two) times daily with a meal.     ONE TOUCH ULTRA TEST test strip  Generic drug:  glucose blood     traMADol 50 MG tablet  Commonly known as:  ULTRAM  Take 1 tablet (50 mg total) by mouth every 8 (eight) hours as needed.        Allergies: No Known Allergies  Past Medical History  Diagnosis Date  . Hypertension   . Diabetes mellitus without complication   . Blood transfusion without reported diagnosis   . History of ETOH abuse     Past Surgical History  Procedure Laterality Date  . Back surgery N/A 2010    Family History  Problem Relation Age of Onset  . Diabetes    . Hypertension    . Arthritis Father   . Diabetes Father     Social History:  reports that he has quit smoking. He quit smokeless tobacco use about 20 years ago. He reports that he does not drink alcohol or use illicit drugs.    Review of Systems       Vision is normal. Most recent eye exam was in 3/15, reportedly normal       Lipids:  He has been on Lipitor for 3-4 years with good control       Lab Results  Component Value  Date   CHOL 99 10/11/2014   HDL 43.90 10/11/2014   LDLCALC 32 10/11/2014   TRIG 115.0 10/11/2014   CHOLHDL 2 10/11/2014       The blood pressure has been high for 2 years, treated with lisinopril with fair control         Physical  Examination:  BP 148/82 mmHg  Pulse 60  Temp(Src) 98.5 F (36.9 C)  Resp 14  Ht 5\' 4"  (1.626 m)  Wt 212 lb 12.8 oz (96.525 kg)  BMI 36.51 kg/m2  SpO2 97%     no ankle edema present    ASSESSMENT:  Diabetes type 2, uncontrolled with obesity and BMI of 36 Although his blood sugars are well controlled in the morning they are progressively higher after meals during the day Most of his postprandial readings are over 200 Discussed with the patient that his regimen of premixed insulin twice a day will not keep his blood sugars controlled and he needs rapid acting mealtime insulin with every meal Also he has not been able to take Victoza possibly because of side effects and again has not taken this as directed He has difficulty losing weight and will benefit from a GLP-1 drug He is checking blood sugars more often in the morning and less after meals and also not keeping a record of these  HYPERTENSION: Blood pressure is high normal today  Hyperlipidemia: Excellent control   PLAN:  He was instructed on changing his insulin to basal bolus using Toujeo and Regular Insulin Discussed actions of basal insulin and also mealtime insulin.  Discussed timing and adjustment of both the doses He was also referred to the nurse educator for more detailed education He was instructed on how to use a Bydureon pen which is covered by his insurance and this should cause less nausea. Encouraged him to watch his carbohydrate intake better and increase exercise as tolerated More blood sugars after meals He will bring his monitor for download and review of blood sugar patterns in one month He was given written instructions including translation in Spanish Consultation with dietitian   Patient Instructions  Toujeo insulin 80 units in am daily and after every 5 days may go up by 5 units if the morning sugars are still over 130  REGULAR INSULIN: This is fast acting insulin to cover you meals.  Prefer to take  this about 30 minutes before eating Take 25 units before breakfast, 35 units before lunch and 30 units before dinner  Check blood sugar consistently at various times including 2 hours after meals, on average twice a day  Start Bydureon once a week and take it the same day of the week.  May cause a little nausea in the first 2-3 days If it will help you cut back on portions of meals  Avoid large portions of starchy foods    Counseling time over 50% of today's 25 minute visit  Edmonia Gonser 10/16/2014, 12:35 PM   Note: This office note was prepared with Insurance underwriterDragon voice recognition system technology. Any transcriptional errors that result from this process are unintentional.

## 2014-10-15 NOTE — Patient Instructions (Addendum)
Toujeo insulin 80 units in am daily and after every 5 days may go up by 5 units if the morning sugars are still over 130  REGULAR INSULIN: This is fast acting insulin to cover you meals.  Prefer to take this about 30 minutes before eating Take 25 units before breakfast, 35 units before lunch and 30 units before dinner  Check blood sugar consistently at various times including 2 hours after meals, on average twice a day  Start Bydureon once a week and take it the same day of the week.  May cause a little nausea in the first 2-3 days If it will help you cut back on portions of meals  Avoid large portions of starchy foods

## 2014-10-16 ENCOUNTER — Encounter: Payer: Medicare Other | Admitting: Nutrition

## 2014-10-16 DIAGNOSIS — Z7982 Long term (current) use of aspirin: Secondary | ICD-10-CM | POA: Diagnosis not present

## 2014-10-16 DIAGNOSIS — E669 Obesity, unspecified: Secondary | ICD-10-CM | POA: Diagnosis not present

## 2014-10-16 DIAGNOSIS — E785 Hyperlipidemia, unspecified: Secondary | ICD-10-CM | POA: Diagnosis not present

## 2014-10-16 DIAGNOSIS — E119 Type 2 diabetes mellitus without complications: Secondary | ICD-10-CM | POA: Diagnosis present

## 2014-10-16 DIAGNOSIS — Z79899 Other long term (current) drug therapy: Secondary | ICD-10-CM | POA: Diagnosis not present

## 2014-10-16 DIAGNOSIS — Z6836 Body mass index (BMI) 36.0-36.9, adult: Secondary | ICD-10-CM | POA: Diagnosis not present

## 2014-10-16 DIAGNOSIS — E1165 Type 2 diabetes mellitus with hyperglycemia: Secondary | ICD-10-CM | POA: Diagnosis not present

## 2014-10-16 DIAGNOSIS — Z794 Long term (current) use of insulin: Secondary | ICD-10-CM | POA: Diagnosis not present

## 2014-10-16 DIAGNOSIS — Z713 Dietary counseling and surveillance: Secondary | ICD-10-CM | POA: Diagnosis not present

## 2014-10-16 DIAGNOSIS — I10 Essential (primary) hypertension: Secondary | ICD-10-CM | POA: Diagnosis not present

## 2014-10-16 NOTE — Patient Instructions (Addendum)
Take Bydureon injection once a week. Take Toujeo once a day. Take Regular insulin 30 min. Before all meals

## 2014-10-16 NOTE — Progress Notes (Signed)
Samuel [name] was instructed on the use of the Bydureon pen.  He was shown the steps to mixing the pen and how to attach the needle and the need to inject this medication once a week.  He reported good understanding of this and had no final questions.   We also reviewed how to use the Toujeo pen and the need to take this insulin once a day-at the same time each day.  We also reviewed the need for the Regular insulin, and the need to take this 30 min.   before each meal.  We reviewed all of these doses and he was given a written record of this and highled the doses and times to take each of these.  He reported good understanding of this and had no final questions.

## 2014-10-17 ENCOUNTER — Ambulatory Visit: Payer: Medicare Other | Admitting: Endocrinology

## 2014-11-13 ENCOUNTER — Ambulatory Visit (INDEPENDENT_AMBULATORY_CARE_PROVIDER_SITE_OTHER): Payer: Medicare Other | Admitting: Endocrinology

## 2014-11-13 ENCOUNTER — Encounter: Payer: Self-pay | Admitting: Endocrinology

## 2014-11-13 VITALS — BP 122/57 | HR 58 | Temp 98.3°F | Resp 14 | Ht 64.0 in | Wt 214.0 lb

## 2014-11-13 DIAGNOSIS — IMO0002 Reserved for concepts with insufficient information to code with codable children: Secondary | ICD-10-CM

## 2014-11-13 DIAGNOSIS — E1165 Type 2 diabetes mellitus with hyperglycemia: Secondary | ICD-10-CM | POA: Diagnosis not present

## 2014-11-13 DIAGNOSIS — I10 Essential (primary) hypertension: Secondary | ICD-10-CM

## 2014-11-13 NOTE — Patient Instructions (Addendum)
Toujeo 70 units in am daily and if am sugar stays over 140 go up to 75  REGULAR INSULIN  30 units before breakfast, 45 units before lunch and ALSO before dinner  Call sugar readings in 1 week  Toujeo 70 unidades en la maana diaria y si Warden/rangermaana el azcar se mantiene ms de 140 ir hasta 75  INSULINA REGULAR 30 unidades antes del desayuno, 45 unidades antes del almuerzo y antes de la cena TAMBIN  Llame a las lecturas de International aid/development workerazcar en 1 semana

## 2014-11-13 NOTE — Progress Notes (Signed)
Patient ID: Samuel [name], male   DOB: 07-25-1951, 64 y.o.   MRN: [id number]            Reason for Appointment:  Followup for Type 2 Diabetes  Referring physician: [place]  History of Present Illness:          Diagnosis: Type 2 diabetes mellitus, date of diagnosis:  1990      Past history: He thinks he has been on insulin for the last 10-12 years. Previously had been on metformin which has been continued. He was probably tried on different insulin regimens initially but has been taking 70/30 mostly because of cost Previous records are not available and appears that his sugars have been poorly controlled for several years He has been referred here because of an A1c in 8/15 of 9.7%.  Recent history:  A previously was on a regimen of Novolin 70/30 twice a day but because of inadequate control and higher readings later in the day he was switched to basal bolus insulin regimen in 12/15. He has been able to get his Toujeo insulin as directed Also taking Bydureon without any side effects, was switched from Victoza. Current blood sugar patterns and problems:  Probably having a relatively high carbohydrate diet with rice and tortillas at most of his meals  Blood sugars are excellent in the morning without hypoglycemia except once when his glucose was 66.  He is taking his Toujeo at variable times of the day  Blood sugars are progressively higher later in the day and mostly over 200  He is mostly not taking his breakfast coverage as he is afraid to take it with normal fasting glucose  He will forget to take his insulin at mealtimes when he is eating out.  He did not understand that he can take the insulin out of the refrigerator for this.  He has no difficulty doing the Bydureon and has been taking this weekly; he thinks he is cutting back on portions  Despite his trying to do a little walking and taking Bydureon he has gained a little weight.       Oral hypoglycemic drugs the patient is  taking are:   metformin 1 g twice a day     Side effects from medications have been: ?  Nausea/dizziness on Victoza INSULIN regimen is described as:  Toujeo 80 acb;   25 units before breakfast, 35 units before lunch and 30 units before dinner  Compliance with the medical regimen: Fair  Hypoglycemia:  twice in the last month  Glucose monitoring:  done twice a day         Glucometer:  One Touch Verio     Blood Glucose readings by time of day by review of meter download  PRE-MEAL Breakfast Lunch Dinner Bedtime Overall  Glucose range:  66-162   219-281   262, 269   236-337    Mean/median:      222    POST-MEAL PC Breakfast PC Lunch PC Dinner  Glucose range:   215-332    Mean/median:      Self-care: The diet that the patient has been following is: None, not controlling portions and may have high carbohydrate meals especially breakfast    Meals: 3 meals per day. Breakfast is various kinds of bread, milk and eggs         Exercise: Walking about 1 mile, 3 days a week in the evenings           Dietician visit, most  recent: 5 years ago.               Weight history: 200-220  Wt Readings from Last 3 Encounters:  11/13/14 214 lb (97.07 kg)  10/15/14 212 lb 12.8 oz (96.525 kg)  10/02/14 214 lb 6.4 oz (97.251 kg)    Glycemic control:   Lab Results  Component Value Date   HGBA1C 9.7* 06/17/2014   Lab Results  Component Value Date   LDLCALC 32 10/11/2014   CREATININE 0.9 10/11/2014         Medication List       This list is accurate as of: 11/13/14 10:05 AM.  Always use your most recent med list.               aspirin 325 MG tablet  Take 325 mg by mouth daily.     atorvastatin 40 MG tablet  Commonly known as:  LIPITOR  Take 40 mg by mouth daily. Take 1/2 tablet at bedtime     cholecalciferol 1000 UNITS tablet  Commonly known as:  VITAMIN D  Take 1,000 Units by mouth daily.     Vitamin D3 1000 UNITS Caps     Exenatide ER 2 MG Susr  Commonly known as:  BYDUREON    Inject 2 mg into the skin once a week.     Insulin Glargine 300 UNIT/ML Sopn  Commonly known as:  TOUJEO SOLOSTAR  Inject 80 Units into the skin daily. 80 units daily, every 5 days go up 5 units if blood sugar over 130     insulin NPH Human 100 UNIT/ML injection  Commonly known as:  HUMULIN N  Inject 25 units at breakfast, 35 units at lunch and 30 units at dinner     Insulin Pen Needle 31G X 5 MM Misc  Use once daily with victoza     Insulin Syringe-Needle U-100 30G X 5/16" 0.5 ML Misc  Use to inject insulin     INSULIN SYRINGE 1CC/30GX5/16" 30G X 5/16" 1 ML Misc     Liraglutide 18 MG/3ML Sopn  Commonly known as:  VICTOZA  Inject 0.6 mg daily for the first week, then increase to 1.2 daily     lisinopril 20 MG tablet  Commonly known as:  PRINIVIL,ZESTRIL  Take 20 mg by mouth daily.     meloxicam 7.5 MG tablet  Commonly known as:  MOBIC     metFORMIN 1000 MG tablet  Commonly known as:  GLUCOPHAGE  Take 1,000 mg by mouth 2 (two) times daily with a meal.     ONE TOUCH ULTRA TEST test strip  Generic drug:  glucose blood     traMADol 50 MG tablet  Commonly known as:  ULTRAM  Take 1 tablet (50 mg total) by mouth every 8 (eight) hours as needed.        Allergies: No Known Allergies  Past Medical History  Diagnosis Date  . Hypertension   . Diabetes mellitus without complication   . Blood transfusion without reported diagnosis   . History of ETOH abuse     Past Surgical History  Procedure Laterality Date  . Back surgery N/A 2010    Family History  Problem Relation Age of Onset  . Diabetes    . Hypertension    . Arthritis Father   . Diabetes Father     Social History:  reports that he has quit smoking. He quit smokeless tobacco use about 21 years ago. He reports that he does  not drink alcohol or use illicit drugs.    Review of Systems       Vision is normal. Most recent eye exam was in 3/15, reportedly normal       Lipids:  He has been on Lipitor for  3-4 years with good control       Lab Results  Component Value Date   CHOL 99 10/11/2014   HDL 43.90 10/11/2014   LDLCALC 32 10/11/2014   TRIG 115.0 10/11/2014   CHOLHDL 2 10/11/2014       The blood pressure has been high for 2 years, treated with lisinopril with fair control         Physical Examination:  BP 122/57 mmHg  Pulse 58  Temp(Src) 98.3 F (36.8 C)  Resp 14  Ht 5\' 4"  (1.626 m)  Wt 214 lb (97.07 kg)  BMI 36.72 kg/m2  SpO2 98%     no ankle edema present    ASSESSMENT:  Diabetes type 2, uncontrolled with obesity and BMI of 36 Although his blood sugars are well controlled in the morning with Toujeo they are progressively higher after meals during the day Most of his postprandial readings are over 200 Again discussed that he needs to modify his diet with reduced carbohydrate intake He has difficulty understanding the timing of both the insulin doses Also not taking mealtime coverage at breakfast usually since he felt his blood sugar was normal before eating He can take his insulin with him when he is eating out which he is not doing currently.  HYPERTENSION: Blood pressure is good    PLAN:   He was instructed on timing of both the insulin doses  Discussed modification of diet  Discussed need for taking insulin proactively before eating to keep it from going up  Increase doses of regular insulin at lunch and dinner  Reduce Toujeo since he has occasional low normal sugars in the mornings and his suppertime dose will be increased.  Discussed how to titrate this  Patient Instructions  Toujeo 70 units in am daily and if am sugar stays over 140 go up to 75  REGULAR INSULIN  30 units before breakfast, 45 units before lunch and ALSO before dinner  Call sugar readings in 1 week  Toujeo 70 unidades en la maana diaria y si Warden/rangermaana el azcar se mantiene ms de 140 ir hasta 75  INSULINA REGULAR 30 unidades antes del desayuno, 45 unidades antes del almuerzo y  antes de la cena TAMBIN  Llame a las lecturas de azcar en 1 semana     Counseling time over 50% of today's 25 minute visit  Samuel [name] 11/13/2014, 10:05 AM   Note: This office note was prepared with Insurance underwriterDragon voice recognition system technology. Any transcriptional errors that result from this process are unintentional.

## 2014-12-10 ENCOUNTER — Other Ambulatory Visit: Payer: Self-pay | Admitting: *Deleted

## 2014-12-10 ENCOUNTER — Telehealth: Payer: Self-pay | Admitting: Endocrinology

## 2014-12-10 MED ORDER — METFORMIN HCL 1000 MG PO TABS: 1000.0000 mg | ORAL_TABLET | Freq: Two times a day (BID) | ORAL | Status: DC

## 2014-12-10 NOTE — Telephone Encounter (Signed)
rx sent

## 2014-12-10 NOTE — Telephone Encounter (Signed)
Patient need a refill of his Metformin 1000 mg

## 2014-12-12 ENCOUNTER — Ambulatory Visit: Payer: Medicaid Other | Admitting: Endocrinology

## 2015-01-02 ENCOUNTER — Encounter: Payer: Self-pay | Admitting: Medical

## 2015-01-02 ENCOUNTER — Ambulatory Visit (INDEPENDENT_AMBULATORY_CARE_PROVIDER_SITE_OTHER): Payer: Medicare Other | Admitting: Medical

## 2015-01-02 VITALS — BP 120/80 | HR 80 | Temp 98.4°F | Ht 64.0 in | Wt 211.2 lb

## 2015-01-02 DIAGNOSIS — I10 Essential (primary) hypertension: Secondary | ICD-10-CM

## 2015-01-02 DIAGNOSIS — R059 Cough, unspecified: Secondary | ICD-10-CM | POA: Insufficient documentation

## 2015-01-02 DIAGNOSIS — R05 Cough: Secondary | ICD-10-CM

## 2015-01-02 MED ORDER — LOSARTAN POTASSIUM 50 MG PO TABS: 50.0000 mg | ORAL_TABLET | Freq: Every day | ORAL | Status: DC

## 2015-01-02 MED ORDER — ATORVASTATIN CALCIUM 40 MG PO TABS: 40.0000 mg | ORAL_TABLET | Freq: Every day | ORAL | Status: DC

## 2015-01-02 NOTE — Patient Instructions (Signed)
Cough One month ago sounded like bronchitis and asthma. Now dry cough random and is possible side effect lisinopril. Will rx losartan in place of. Follow up in one month for bp check.   Essential hypertension, benign Controlled today. Unfortunately may have side effect of dry cough from lisniropril. Will change to losartan. Recheck bp one month.     Continue same meds for diabetes and lipids.  Follow on month or as needed.

## 2015-01-02 NOTE — Assessment & Plan Note (Signed)
One month ago sounded like bronchitis and asthma. Now dry cough random and is possible side effect lisinopril. Will rx losartan in place of. Follow up in one month for bp check.

## 2015-01-02 NOTE — Assessment & Plan Note (Signed)
Controlled today. Unfortunately may have side effect of dry cough from lisniropril. Will change to losartan. Recheck bp one month.

## 2015-01-02 NOTE — Progress Notes (Signed)
   Subjective:    Patient ID: Samuel [name] Doescher, male    DOB: Apr 03, 1951, 64 y.o.   MRN: [id number]  HPI    Pt see endocrinologist for his diabetes. Pt is on toujeo . And Regular insulin.(breakfast lunch and dinner)  I reviewed endnocrine note on 11-13-2014. note but don't see last a1-c. Pt has follow up on 12-12-2014 with eondocrine again.   Pt last lipid panel looked good. ldl was 32. This was 2 month ago. Pt complaint on atorvastatin and no side effects.  Pt bp is very good today. He has dx of htn. Was on lisinopril in the past.  Pt went to Hospital. Cough was severe. Almost made him choke. Pt told him probable bronchitis. They also treated with neb machine. Discharged with antibiotic and also one inhaler. Sounds like albuterol.  Pt is still taking lisinopril.   Still gets occasional dry cough. Went to ED about one month.     Review of Systems  Constitutional: Negative for fever, chills, diaphoresis, activity change and fatigue.  Respiratory: Negative for cough, chest tightness and shortness of breath.   Cardiovascular: Negative for chest pain, palpitations and leg swelling.  Gastrointestinal: Negative for nausea, vomiting and abdominal pain.  Musculoskeletal: Negative for neck pain and neck stiffness.  Neurological: Negative for dizziness, tremors, seizures, syncope, facial asymmetry, speech difficulty, weakness, light-headedness, numbness and headaches.  Psychiatric/Behavioral: Negative for behavioral problems, confusion and agitation. The patient is not nervous/anxious.        Objective:   Physical Exam  General Mental Status- Alert. General Appearance- Not in acute distress.   Skin General: Color- Normal Color. Moisture- Normal Moisture.  Neck Carotid Arteries- Normal color. Moisture- Normal Moisture. No carotid bruits. No JVD.  Chest and Lung Exam Auscultation: Breath Sounds:-Normal.  Cardiovascular Auscultation:Rythm- Regular. Murmurs & Other Heart  Sounds:Auscultation of the heart reveals- No Murmurs.  Abdomen Inspection:-Inspeection Normal. Palpation/Percussion:Note:No mass. Palpation and Percussion of the abdomen reveal- Non Tender, Non Distended + BS, no rebound or guarding.    Neurologic Cranial Nerve exam:- CN III-XII intact(No nystagmus), symmetric smile. Drift Test:- No drift. Romberg Exam:- Negative.  Heal to Toe Gait exam:-Normal. Finger to Nose:- Normal/Intact Strength:- 5/5 equal and symmetric strength both upper and lower extremities.      Assessment & Plan:

## 2015-01-02 NOTE — Progress Notes (Signed)
Pre visit review using our clinic review tool, if applicable. No additional management support is needed unless otherwise documented below in the visit note. 

## 2015-01-13 ENCOUNTER — Other Ambulatory Visit: Payer: Self-pay | Admitting: *Deleted

## 2015-01-13 ENCOUNTER — Encounter: Payer: Self-pay | Admitting: Endocrinology

## 2015-01-13 ENCOUNTER — Ambulatory Visit (INDEPENDENT_AMBULATORY_CARE_PROVIDER_SITE_OTHER): Payer: Medicare Other | Admitting: Endocrinology

## 2015-01-13 VITALS — BP 134/66 | HR 72 | Temp 98.2°F | Resp 14 | Ht 64.0 in | Wt 208.6 lb

## 2015-01-13 DIAGNOSIS — E1165 Type 2 diabetes mellitus with hyperglycemia: Secondary | ICD-10-CM | POA: Diagnosis not present

## 2015-01-13 DIAGNOSIS — I10 Essential (primary) hypertension: Secondary | ICD-10-CM

## 2015-01-13 DIAGNOSIS — IMO0002 Reserved for concepts with insufficient information to code with codable children: Secondary | ICD-10-CM

## 2015-01-13 MED ORDER — INSULIN REGULAR HUMAN 100 UNIT/ML IJ SOLN: INTRAMUSCULAR | Status: DC

## 2015-01-13 NOTE — Patient Instructions (Signed)
Toujeo 75 units in am daily and if am sugar stays over 140 go up to80  REGULAR INSULIN  25 units before breakfast, 35 units before lunch and ALSO before dinner    Toujeo 75 unidades en la maana diaria y si Warden/rangermaana el azcar se mantiene ms de 140 ir hasta 80  INSULINA REGULAR 25 unidades antes del desayuno, 35 unidades antes del almuerzo y antes de la cena TAMBIN  Llame a las lecturas de International aid/development workerazcar en 1 semana

## 2015-01-13 NOTE — Progress Notes (Signed)
Patient ID: Samuel [name], male   DOB: [birth year], 64 y.o.   MRN: [id number]            Reason for Appointment:  Followup for Type 2 Diabetes  Referring physician: [place]  History of Present Illness:          Diagnosis: Type 2 diabetes mellitus, date of diagnosis:  1990      Past history: He thinks he has been on insulin for the last 10-12 years. Previously had been on metformin which has been continued. He was probably tried on different insulin regimens initially but has been taking 70/30 mostly because of cost Previous records are not available and appears that his sugars have been poorly controlled for several years He was  referred here because of an A1c in 8/15 of 9.7%. He was on a regimen of Novolin 70/30 twice a day but because of inadequate control and higher readings later in the day he was switched to basal bolus insulin regimen in 12/15.  Recent history:   He has not followed any instructions that were given on his previous visit and has not taken any mealtime insulin when he ran out This is despite giving him clear-cut instructions both English and Spanish on his insulin doses Also he was told to continue Bydureon which was helping him cut back on portions but does not think he is taking this. Difficult to communicate with him despite interpreter Currently taking only Toujeo once a day in the morning. His weight is slightly better possibly from higher blood sugars but also he has been trying to walk more  Current blood sugar patterns and problems:  He is checking his blood sugars mostly before breakfast and supper and occasionally after evening meal only  Despite having glucose readings as high as 413 in the evening he does not understand the need for taking mealtime insulin  FASTING blood sugars are somewhat variable and occasionally over 200  No recent A1c available to judge his overall control  He has lost a little weight recently possibly from increasing  hyperglycemia  Still taking metformin.       Oral hypoglycemic drugs the patient is taking are:   metformin 1 g twice a day     Side effects from medications have been: ?  Nausea/dizziness on Victoza INSULIN regimen is described as:  Toujeo 80 acb; not taking Regular Insulin, previously on  25 units before breakfast, 35 units before lunch and 30 units before dinner  Compliance with the medical regimen: Poor  Hypoglycemia:   none except once at 6:30 AM with glucose 59  Glucose monitoring:  done twice a day         Glucometer:  One Touch ultra 2     Blood Glucose readings by time of day by review of meter download  PRE-MEAL Breakfast Lunch  3-6 PM   8-11 PM  Overall  Glucose range:  59-226   203   145-291   122-413    median:  144     210   191    Self-care: The diet that the patient has been following is: None, not controlling portions and may have high carbohydrate meals especially breakfast    Meals: 3 meals per day. Breakfast is various kinds of bread, milk and eggs. Diner at 6 pm         Exercise: Walking about 1 mile, 5-7 days a week in the evenings  Dietician visit, most recent: 5 years ago.               Weight history: 200-220  Wt Readings from Last 3 Encounters:  01/13/15 208 lb 9.6 oz (94.62 kg)  01/02/15 211 lb 3.2 oz (95.8 kg)  11/13/14 214 lb (97.07 kg)    Glycemic control:   Lab Results  Component Value Date   HGBA1C 9.2* 01/13/2015   HGBA1C 9.7* 06/17/2014   Lab Results  Component Value Date   LDLCALC 32 10/11/2014   CREATININE 0.89 01/13/2015         Medication List       This list is accurate as of: 01/13/15 11:59 PM.  Always use your most recent med list.               aspirin 325 MG tablet  Take 325 mg by mouth daily.     atorvastatin 40 MG tablet  Commonly known as:  LIPITOR  Take 1 tablet (40 mg total) by mouth daily. Take 1/2 tablet at bedtime     cholecalciferol 1000 UNITS tablet  Commonly known as:  VITAMIN D  Take 1,000  Units by mouth daily.     Vitamin D3 1000 UNITS Caps     Insulin Glargine 300 UNIT/ML Sopn  Commonly known as:  TOUJEO SOLOSTAR  Inject 80 Units into the skin daily. 80 units daily, every 5 days go up 5 units if blood sugar over 130     Insulin Pen Needle 31G X 5 MM Misc  Use once daily with victoza     insulin regular 100 units/mL injection  Commonly known as:  HUMULIN R  Inject 25 units at breakfast and 35 units at lunch and dinner     Insulin Syringe-Needle U-100 30G X 5/16" 0.5 ML Misc  Use to inject insulin     INSULIN SYRINGE 1CC/30GX5/16" 30G X 5/16" 1 ML Misc     lisinopril 20 MG tablet  Commonly known as:  PRINIVIL,ZESTRIL     losartan 50 MG tablet  Commonly known as:  COZAAR  Take 1 tablet (50 mg total) by mouth daily.     meloxicam 7.5 MG tablet  Commonly known as:  MOBIC     metFORMIN 1000 MG tablet  Commonly known as:  GLUCOPHAGE  Take 1 tablet (1,000 mg total) by mouth 2 (two) times daily with a meal.     ONE TOUCH ULTRA TEST test strip  Generic drug:  glucose blood     traMADol 50 MG tablet  Commonly known as:  ULTRAM  Take 1 tablet (50 mg total) by mouth every 8 (eight) hours as needed.        Allergies: No Known Allergies  Past Medical History  Diagnosis Date  . Hypertension   . Diabetes mellitus without complication   . Blood transfusion without reported diagnosis   . History of ETOH abuse     Past Surgical History  Procedure Laterality Date  . Back surgery N/A 2010    Family History  Problem Relation Age of Onset  . Diabetes    . Hypertension    . Arthritis Father   . Diabetes Father     Social History:  reports that he has quit smoking. He quit smokeless tobacco use about 21 years ago. He reports that he does not drink alcohol or use illicit drugs.    Review of Systems        Most recent eye exam was in  3/15, reportedly normal       Lipids:  He has been on Lipitor forsince about 2011 good control       Lab Results    Component Value Date   CHOL 99 10/11/2014   HDL 43.90 10/11/2014   LDLCALC 32 10/11/2014   TRIG 115.0 10/11/2014   CHOLHDL 2 10/11/2014       The blood pressure has been high for 2 years, treated with lisinopril   Last foot exam was in 07/2014        Physical Examination:  BP 134/66 mmHg  Pulse 72  Temp(Src) 98.2 F (36.8 C)  Resp 14  Ht 5\' 4"  (1.626 m)  Wt 208 lb 9.6 oz (94.62 kg)  BMI 35.79 kg/m2  SpO2 96%     ASSESSMENT:  Diabetes type 2, uncontrolled with obesity and BMI of 36 Although his blood sugars are on an average not as high as on his last visit he is only taking Toujeo He has difficulty understanding the need for controlling postprandial readings with regular insulin He has no explanation about why he stopped taking Regular Insulin on his own Discussed in detail the actions of regular insulin and need to cover postprandial hyperglycemia Also he may not consistent with diet as he has occasional readings over 200 overnight but also rare blood sugars as low as 50 9 in the morning Even with the interpreted is difficult to understand his difficulties and day-to-day management and he is probably not taking Bydureon although he gives contacting answers. Discussed need for consistent control because of potential for complications  HYPERTENSION: Blood pressure is normal    PLAN:   He was given printed instructions and reviewed these in detail including translation in Spanish  Have  instructed on timing of  Regular Insulin  Needs to restart Bydureon if he is not taking this  Check A1c today  May need to reduce his Toujeo and discussed how to adjust this based on fasting readings every 3-4 days   Schedule follow-up with nurse educator for detailed review of day-to-day management and help with understanding insulin  Patient Instructions  Toujeo 75 units in am daily and if am sugar stays over 140 go up to80  REGULAR INSULIN  25 units before breakfast, 35 units  before lunch and ALSO before dinner    Toujeo 75 unidades en la maana diaria y si Warden/rangermaana el azcar se mantiene ms de 140 ir hasta 80  INSULINA REGULAR 25 unidades antes del desayuno, 35 unidades antes del almuerzo y antes de la cena TAMBIN  Llame a las lecturas de International aid/development workerazcar en 1 semana    Counseling time over 50% of today's 25 minute visit  Zyonna Vardaman 01/14/2015, 4:07 PM   Note: This office note was prepared with Insurance underwriterDragon voice recognition system technology. Any transcriptional errors that result from this process are unintentional.

## 2015-01-14 LAB — BASIC METABOLIC PANEL
BUN: 21 mg/dL (ref 6–23)
CALCIUM: 9.2 mg/dL (ref 8.4–10.5)
CHLORIDE: 103 meq/L (ref 96–112)
CO2: 27 meq/L (ref 19–32)
Creatinine, Ser: 0.89 mg/dL (ref 0.40–1.50)
GFR: 91.6 mL/min (ref >60.00)
Glucose, Bld: 217 mg/dL — ABNORMAL HIGH (ref 70–99)
Potassium: 4.1 mEq/L (ref 3.5–5.1)
Sodium: 138 mEq/L (ref 135–145)

## 2015-01-14 LAB — HEMOGLOBIN A1C: Hgb A1c MFr Bld: 9.2 % — ABNORMAL HIGH (ref 4.6–6.5)

## 2015-01-14 NOTE — Progress Notes (Signed)
Quick Note:  Please let patient know that the A1c still quite high at 9.2 giving average blood sugar about 230. Needs to restart Bydureon weekly  ______

## 2015-01-28 ENCOUNTER — Encounter: Payer: Medicare Other | Admitting: Nutrition

## 2015-01-28 ENCOUNTER — Encounter: Payer: Self-pay | Admitting: Medical

## 2015-01-28 ENCOUNTER — Ambulatory Visit (INDEPENDENT_AMBULATORY_CARE_PROVIDER_SITE_OTHER): Payer: Medicare Other | Admitting: Medical

## 2015-01-28 VITALS — BP 128/74 | HR 62 | Temp 98.2°F | Ht 64.0 in | Wt 206.4 lb

## 2015-01-28 DIAGNOSIS — E785 Hyperlipidemia, unspecified: Secondary | ICD-10-CM

## 2015-01-28 DIAGNOSIS — I10 Essential (primary) hypertension: Secondary | ICD-10-CM

## 2015-01-28 DIAGNOSIS — R059 Cough, unspecified: Secondary | ICD-10-CM

## 2015-01-28 DIAGNOSIS — R05 Cough: Secondary | ICD-10-CM

## 2015-01-28 MED ORDER — LOSARTAN POTASSIUM 50 MG PO TABS: 50.0000 mg | ORAL_TABLET | Freq: Every day | ORAL | Status: DC

## 2015-01-28 NOTE — Assessment & Plan Note (Signed)
Resolved now after stopping ace inhibitor.

## 2015-01-28 NOTE — Assessment & Plan Note (Signed)
I will put future order lipid panel in today with cmp. Please schedule that to be done fasting in next week.

## 2015-01-28 NOTE — Patient Instructions (Addendum)
Essential hypertension, benign Bp is well controlled. Continue losartan.   Hyperlipidemia I will put future order lipid panel in today with cmp. Please schedule that to be done fasting in next week.   Type II or unspecified type diabetes mellitus without mention of complication, not stated as uncontrolled I will ask spanish speaking staff to call endocrinologist office and explain why he missed nuturitionist appointment and reschedule. Continue with meds recommended by endocrinologist.   Cough Resolved now after stopping ace inhibitor.    Follow up in 3 months or as needed.

## 2015-01-28 NOTE — Progress Notes (Signed)
Pre visit review using our clinic review tool, if applicable. No additional management support is needed unless otherwise documented below in the visit note. 

## 2015-01-28 NOTE — Assessment & Plan Note (Signed)
Bp is well controlled. Continue losartan.

## 2015-01-28 NOTE — Assessment & Plan Note (Signed)
I will ask spanish speaking staff to call endocrinologist office and explain why he missed nuturitionist appointment and reschedule. Continue with meds recommended by endocrinologist.

## 2015-01-28 NOTE — Progress Notes (Signed)
Subjective:    Patient ID: Samuel [name], male    DOB: April 25, 1951, 64 y.o.   MRN: [id number]  HPI   Pt in for bp follow up. Pt does not understand bp range. He does have a machine. I explained goal of less than 140/90. For diabetics may be better 130/80. No cardiac or neurologic signs or symptoms.  Note on change of lisinopril to losartan his cough went away.  Pt not fasting. He has history of hyperlipidemia. No side effects of medicine.  Pt has diabetes. Seen in January. Today they accidentally  scheduled appointment at the same time with nutritionist.      Review of Systems  Constitutional: Negative for fever, chills, diaphoresis, activity change and fatigue.  Respiratory: Negative for cough, chest tightness and shortness of breath.   Cardiovascular: Negative for chest pain, palpitations and leg swelling.  Gastrointestinal: Negative for nausea, vomiting and abdominal pain.  Musculoskeletal: Negative for myalgias, neck pain and neck stiffness.  Neurological: Negative for dizziness, tremors, seizures, syncope, facial asymmetry, speech difficulty, weakness, light-headedness, numbness and headaches.  Psychiatric/Behavioral: Negative for behavioral problems, confusion and agitation. The patient is not nervous/anxious.    Past Medical History  Diagnosis Date  . Hypertension   . Diabetes mellitus without complication   . Blood transfusion without reported diagnosis   . History of ETOH abuse     History   Social History  . Marital Status: Married    Spouse Name: N/A  . Number of Children: N/A  . Years of Education: N/A   Occupational History  . Not on file.   Social History Main Topics  . Smoking status: Former Games developer  . Smokeless tobacco: Former Neurosurgeon    Quit date: 11/08/1993  . Alcohol Use: No  . Drug Use: No  . Sexual Activity: Not on file   Other Topics Concern  . Not on file   Social History Narrative    Past Surgical History  Procedure Laterality Date  .  Back surgery N/A 2010    Family History  Problem Relation Age of Onset  . Diabetes    . Hypertension    . Arthritis Father   . Diabetes Father     No Known Allergies  Current Outpatient Prescriptions on File Prior to Visit  Medication Sig Dispense Refill  . aspirin 325 MG tablet Take 325 mg by mouth daily.    [name] atorvastatin (LIPITOR) 40 MG tablet Take 1 tablet (40 mg total) by mouth daily. Take 1/2 tablet at bedtime 45 tablet 1  . cholecalciferol (VITAMIN D) 1000 UNITS tablet Take 1,000 Units by mouth daily.    . Cholecalciferol (VITAMIN D3) 1000 UNITS CAPS   6  . Insulin Glargine (TOUJEO SOLOSTAR) 300 UNIT/ML SOPN Inject 80 Units into the skin daily. 80 units daily, every 5 days go up 5 units if blood sugar over 130 9 pen 2  . Insulin Pen Needle 31G X 5 MM MISC Use once daily with victoza 30 each 3  . insulin regular (HUMULIN R) 100 units/mL injection Inject 25 units at breakfast and 35 units at lunch and dinner 20 mL 3  . Insulin Syringe-Needle U-100 (INSULIN SYRINGE 1CC/30GX5/16") 30G X 5/16" 1 ML MISC   5  . Insulin Syringe-Needle U-100 30G X 5/16" 0.5 ML MISC Use to inject insulin 100 each 2  . losartan (COZAAR) 50 MG tablet Take 1 tablet (50 mg total) by mouth daily. 30 tablet 0  . metFORMIN (GLUCOPHAGE) 1000 MG tablet Take  1 tablet (1,000 mg total) by mouth 2 (two) times daily with a meal. 60 tablet 3  . ONE TOUCH ULTRA TEST test strip   5   No current facility-administered medications on file prior to visit.    BP 128/74 mmHg  Pulse 62  Temp(Src) 98.2 F (36.8 C) (Oral)  Ht 5\' 4"  (1.626 m)  Wt 206 lb 6.4 oz (93.622 kg)  BMI 35.41 kg/m2  SpO2 100%      Objective:   Physical Exam  General Mental Status- Alert. General Appearance- Not in acute distress.   Skin General: Color- Normal Color. Moisture- Normal Moisture.  Neck Carotid Arteries- Normal color. Moisture- Normal Moisture. No carotid bruits. No JVD.  Chest and Lung Exam Auscultation: Breath  Sounds:-Normal.  Cardiovascular Auscultation:Rythm- Regular. Murmurs & Other Heart Sounds:Auscultation of the heart reveals- No Murmurs.  Abdomen Inspection:-Inspeection Normal. Palpation/Percussion:Note:No mass. Palpation and Percussion of the abdomen reveal- Non Tender, Non Distended + BS, no rebound or guarding.    Neurologic Cranial Nerve exam:- CN III-XII intact(No nystagmus), symmetric smile. Drift Test:- No drift. Romberg Exam:- Negative.  Heal to Toe Gait exam:-Normal. Finger to Nose:- Normal/Intact Strength:- 5/5 equal and symmetric strength both upper and lower extremities.      Assessment & Plan:

## 2015-02-04 ENCOUNTER — Other Ambulatory Visit: Payer: Self-pay | Admitting: *Deleted

## 2015-02-04 ENCOUNTER — Encounter: Payer: Medicare Other | Attending: Endocrinology | Admitting: Nutrition

## 2015-02-04 DIAGNOSIS — E785 Hyperlipidemia, unspecified: Secondary | ICD-10-CM | POA: Insufficient documentation

## 2015-02-04 DIAGNOSIS — Z713 Dietary counseling and surveillance: Secondary | ICD-10-CM | POA: Insufficient documentation

## 2015-02-04 DIAGNOSIS — E118 Type 2 diabetes mellitus with unspecified complications: Secondary | ICD-10-CM

## 2015-02-04 DIAGNOSIS — I10 Essential (primary) hypertension: Secondary | ICD-10-CM | POA: Diagnosis not present

## 2015-02-04 DIAGNOSIS — Z7982 Long term (current) use of aspirin: Secondary | ICD-10-CM | POA: Diagnosis not present

## 2015-02-04 DIAGNOSIS — Z6836 Body mass index (BMI) 36.0-36.9, adult: Secondary | ICD-10-CM | POA: Insufficient documentation

## 2015-02-04 DIAGNOSIS — E119 Type 2 diabetes mellitus without complications: Secondary | ICD-10-CM | POA: Insufficient documentation

## 2015-02-04 DIAGNOSIS — E1165 Type 2 diabetes mellitus with hyperglycemia: Secondary | ICD-10-CM | POA: Diagnosis not present

## 2015-02-04 DIAGNOSIS — E669 Obesity, unspecified: Secondary | ICD-10-CM | POA: Diagnosis not present

## 2015-02-04 DIAGNOSIS — Z794 Long term (current) use of insulin: Secondary | ICD-10-CM | POA: Insufficient documentation

## 2015-02-04 DIAGNOSIS — Z79899 Other long term (current) drug therapy: Secondary | ICD-10-CM | POA: Diagnosis not present

## 2015-02-04 MED ORDER — INSULIN PEN NEEDLE 31G X 5 MM MISC: Status: DC

## 2015-02-04 MED ORDER — INSULIN LISPRO 100 UNIT/ML (KWIKPEN): PEN_INJECTOR | SUBCUTANEOUS | Status: DC

## 2015-02-04 MED ORDER — INSULIN GLARGINE 300 UNIT/ML ~~LOC~~ SOPN: 25.0000 [IU] | PEN_INJECTOR | Freq: Every day | SUBCUTANEOUS | Status: DC

## 2015-02-04 MED ORDER — EXENATIDE ER 2 MG ~~LOC~~ PEN: PEN_INJECTOR | SUBCUTANEOUS | Status: DC

## 2015-02-04 NOTE — Patient Instructions (Signed)
1.  Take 25u of Toujeo once every morning. 2.  Take 35u of Humalog 5-10 min. Before each meal. 3.  Take Byrdureon once a week.  Have daughter help with mixing the medication before taking it.  4.  Test blood sugars before all meals and at bedtime.

## 2015-02-04 NOTE — Progress Notes (Signed)
The patient is here with his interpreter Truddie HiddenGracieta Namilira, to revew insulin doses and blood sugar readings.  He did not bring his meter, but says that his FBSs are between 85-195, with most around 150-195, acL: 95-160, acS: 130-200, with most readings around 200.   Insulin dose:   1. Toujeo-none.  He has not used any for over 2 weeks.   2. Humulin R via a syringe:  35u ac all meals.  He dose not like the syringe, and would prefer a pen.  I explained that we would need to switch to Humalog, and that it would cost much more than the R insulin.  He assured me that this would be ok.  I also told him that he can take this right before eating his meals, instead of waiting 15 min.  He reported good understanding of this. 3. Bydureon:  Not taking this.  Has never taken this.  When I showed him the pen, and explained that it would help him to loose weight, by eating less, he was very excited to try this.  We reviewed again how to mix and take this, and written instructions were given for once a week.  He reported that his daughter is a Teacher, early years/prepharmacist and will help him with this. I suggested he pick this up at the pharmacy and come by tomorrow any time, to take it here, but he refused.    Low blood sugars:  He denies any of this, and we reviewed the symptoms and treatment, and was told to drink 1/2 cup of juice, wait 15 min. And retest his blood sugar.  He was told to call the office if this happens and he agreed to do this. Per Dr. Remus BlakeKumar's order, which was reverbalized, he will take only 25u of the Toujeo, once a day, and the bydureon, once a week.  Bjorn LoserRhonda was notified and the Toujeo, Humalog and Byrdureon were again called in with the order for the 25u of Toujeo q AM.    I will call him in one week, to see how he is doing.

## 2015-02-05 ENCOUNTER — Other Ambulatory Visit (INDEPENDENT_AMBULATORY_CARE_PROVIDER_SITE_OTHER): Payer: Medicare Other

## 2015-02-05 DIAGNOSIS — E785 Hyperlipidemia, unspecified: Secondary | ICD-10-CM | POA: Diagnosis not present

## 2015-02-05 DIAGNOSIS — I10 Essential (primary) hypertension: Secondary | ICD-10-CM

## 2015-02-05 LAB — COMPREHENSIVE METABOLIC PANEL
ALBUMIN: 4.1 g/dL (ref 3.5–5.2)
ALT: 25 U/L (ref 0–53)
AST: 23 U/L (ref 0–37)
Alkaline Phosphatase: 47 U/L (ref 39–117)
BUN: 21 mg/dL (ref 6–23)
CALCIUM: 9.4 mg/dL (ref 8.4–10.5)
CO2: 29 meq/L (ref 19–32)
Chloride: 100 mEq/L (ref 96–112)
Creatinine, Ser: 0.79 mg/dL (ref 0.40–1.50)
GFR: 105.09 mL/min (ref >60.00)
GLUCOSE: 198 mg/dL — AB (ref 70–99)
POTASSIUM: 4 meq/L (ref 3.5–5.1)
Sodium: 135 mEq/L (ref 135–145)
TOTAL PROTEIN: 6.8 g/dL (ref 6.0–8.3)
Total Bilirubin: 0.7 mg/dL (ref 0.2–1.2)

## 2015-02-05 LAB — LIPID PANEL
CHOLESTEROL: 80 mg/dL (ref 0–200)
HDL: 39.6 mg/dL (ref >39.00)
LDL Cholesterol: 18 mg/dL (ref 0–99)
NonHDL: 40.4
TRIGLYCERIDES: 111 mg/dL (ref 0.0–149.0)
Total CHOL/HDL Ratio: 2
VLDL: 22.2 mg/dL (ref 0.0–40.0)

## 2015-02-06 ENCOUNTER — Telehealth: Payer: Self-pay | Admitting: Medical

## 2015-02-06 NOTE — Telephone Encounter (Signed)
Pt advised on lipid panel results. Very good results.Continue with same dose of medication.

## 2015-02-25 ENCOUNTER — Ambulatory Visit: Payer: Medicare Other | Admitting: Endocrinology

## 2015-03-12 ENCOUNTER — Encounter: Payer: Self-pay | Admitting: Endocrinology

## 2015-03-12 ENCOUNTER — Ambulatory Visit (INDEPENDENT_AMBULATORY_CARE_PROVIDER_SITE_OTHER): Payer: Medicare Other | Admitting: Endocrinology

## 2015-03-12 ENCOUNTER — Other Ambulatory Visit: Payer: Self-pay | Admitting: *Deleted

## 2015-03-12 VITALS — BP 126/61 | HR 60 | Temp 98.3°F | Resp 16 | Ht 64.0 in | Wt 211.2 lb

## 2015-03-12 DIAGNOSIS — I10 Essential (primary) hypertension: Secondary | ICD-10-CM

## 2015-03-12 DIAGNOSIS — E1165 Type 2 diabetes mellitus with hyperglycemia: Secondary | ICD-10-CM | POA: Diagnosis not present

## 2015-03-12 DIAGNOSIS — IMO0002 Reserved for concepts with insufficient information to code with codable children: Secondary | ICD-10-CM

## 2015-03-12 MED ORDER — INSULIN GLARGINE 300 UNIT/ML ~~LOC~~ SOPN: 35.0000 [IU] | PEN_INJECTOR | Freq: Every morning | SUBCUTANEOUS | Status: DC

## 2015-03-12 NOTE — Progress Notes (Signed)
Patient ID: Mamadou Breon, male   DOB: [birth year], 64 y.o.   MRN: [id number]            Reason for Appointment:  Followup for Type 2 Diabetes  Referring physician: [place]  History of Present Illness:          Diagnosis: Type 2 diabetes mellitus, date of diagnosis:  1990      Past history: He thinks he has been on insulin for the last 10-12 years. Previously had been on metformin which has been continued. He was probably tried on different insulin regimens initially but has been taking 70/30 mostly because of cost Previous records are not available and appears that his sugars have been poorly controlled for several years He was  referred here because of an A1c in 8/15 of 9.7%. He was on a regimen of Novolin 70/30 twice a day but because of inadequate control and higher readings later in the day he was switched to basal bolus insulin regimen in 12/15.  Recent history:  History obtained through interpreter  INSULIN regimen is described as:  Toujeo 35 acb; taking Regular Insulin,  35 units before supper mostly  He has still not followed insulin instructions that were given on his previous visit and also by the nurse educator Also he was told to start Bydureon but did not do so until he talked to the nurse educator who reminded him about this. Previously was taking 80 units of Toujeo and now he thinks he is taking only 85 No recent A1c available but it has been generally over 9% His weight is slightly higher again This is despite taking Bydureon  Current blood sugar patterns and problems identified:  He is checking his blood sugars mostly before breakfast and supper and not clear what his postprandial readings are  He does not understand that he can take his mealtime insulin with him when he is eating out and is usually not taking it at lunchtime  He does not understand that he can take regular insulin along with Toujeo to cover his breakfast and is taking only basal insulin in the  morning  Not clear what his blood sugars are after supper,  He does not adjust his insulin based on how much he is eating or increase the dose when the blood sugar is high in the evening  Blood sugars are fairly consistently high in the evening before supper  he is generally taking Regular Insulin only before supper and not at other meals  FASTING blood sugars are somewhat variable but on an average mildly increased  Still taking metformin.       Oral hypoglycemic drugs the patient is taking are:   metformin 1 g twice a day     Side effects from medications have been: ?  Nausea/dizziness on Victoza  Compliance with the medical regimen: Poor  Hypoglycemia:   none   Glucose monitoring:  done twice a day         Glucometer:  One Touch ultra 2     Blood Glucose readings by time of day by review of meter download  PRE-MEAL  a.m.  Lunch Dinner Bedtime Overall  Glucose range: 70-288    164-388     Median:  145    300    185     Self-care: The diet that the patient has been following is: None, not controlling portions and may have high carbohydrate meals especially breakfast    Meals: 3 meals per day.  Breakfast is various kinds of bread, milk and eggs.  Dinner at 6 pm         Exercise: Walking about 1 mile, 5-7 days a week in the evenings           Dietician visit, most recent: 5 years ago.               Weight history: 200-220  Wt Readings from Last 3 Encounters:  03/12/15 211 lb 3.2 oz (95.8 kg)  01/28/15 206 lb 6.4 oz (93.622 kg)  01/13/15 208 lb 9.6 oz (94.62 kg)    Glycemic control:   Lab Results  Component Value Date   HGBA1C 9.2* 01/13/2015   HGBA1C 9.7* 06/17/2014   Lab Results  Component Value Date   LDLCALC 18 02/05/2015   CREATININE 0.79 02/05/2015         Medication List       This list is accurate as of: 03/12/15  3:08 PM.  Always use your most recent med list.               aspirin 325 MG tablet  Take 325 mg by mouth daily.     atorvastatin 40  MG tablet  Commonly known as:  LIPITOR  Take 1 tablet (40 mg total) by mouth daily. Take 1/2 tablet at bedtime     cholecalciferol 1000 UNITS tablet  Commonly known as:  VITAMIN D  Take 1,000 Units by mouth daily.     Vitamin D3 1000 UNITS Caps     Exenatide ER 2 MG Pen  Commonly known as:  BYDUREON  Inject contents of one pen per week     Insulin Glargine 300 UNIT/ML Sopn  Commonly known as:  TOUJEO SOLOSTAR  Inject 35 Units into the skin every morning.     Insulin Pen Needle 31G X 5 MM Misc  Use 5 per day     insulin regular 100 units/mL injection  Commonly known as:  HUMULIN R  Inject 25 units at breakfast and 35 units at lunch and dinner     Insulin Syringe-Needle U-100 30G X 5/16" 0.5 ML Misc  Use to inject insulin     INSULIN SYRINGE 1CC/30GX5/16" 30G X 5/16" 1 ML Misc     losartan 50 MG tablet  Commonly known as:  COZAAR  Take 1 tablet (50 mg total) by mouth daily.     metFORMIN 1000 MG tablet  Commonly known as:  GLUCOPHAGE  Take 1 tablet (1,000 mg total) by mouth 2 (two) times daily with a meal.     ONE TOUCH ULTRA TEST test strip  Generic drug:  glucose blood        Allergies: No Known Allergies  Past Medical History  Diagnosis Date  . Hypertension   . Diabetes mellitus without complication   . Blood transfusion without reported diagnosis   . History of ETOH abuse     Past Surgical History  Procedure Laterality Date  . Back surgery N/A 2010    Family History  Problem Relation Age of Onset  . Diabetes    . Hypertension    . Arthritis Father   . Diabetes Father     Social History:  reports that he has quit smoking. He quit smokeless tobacco use about 21 years ago. He reports that he does not drink alcohol or use illicit drugs.    Review of Systems        Most recent eye exam was in 3/15, reportedly  normal       Lipids:  He has been on Lipitor forsince about 2011 good control       Lab Results  Component Value Date   CHOL 80  02/05/2015   HDL 39.60 02/05/2015   LDLCALC 18 02/05/2015   TRIG 111.0 02/05/2015   CHOLHDL 2 02/05/2015       The blood pressure has been high for 2 years, treated with lisinopril   Last foot exam was in 07/2014        Physical Examination:  BP 126/61 mmHg  Pulse 60  Temp(Src) 98.3 F (36.8 C)  Resp 16  Ht 5\' 4"  (1.626 m)  Wt 211 lb 3.2 oz (95.8 kg)  BMI 36.23 kg/m2  SpO2 96%     ASSESSMENT:  Diabetes type 2, uncontrolled with obesity and BMI of 36 Although he has been given Lyrica and instructions on how to take his insulin and also has had detailed education done by nurse educator he still does not follow instructions for either the timing or doses of his insulin  As discussed in detail above he is taking regular insulin only to cover his evening meal and his blood sugars are markedly increased before supper because of not covering his breakfast and lunch He does not understand that he can take his insulin with him when he is going out to eat Also despite taking only a small amount of basal insulin now he usually has fairly good readings in the mornings Not clear if he is benefiting from Bydureon as he has not lost weight and he does not think it improved satiety much    PLAN:   He was given printed instructions and reviewed these in detail including translation in Spanish  Have  instructed on timing of  Regular Insulin and discussed that it needs to be taken with every meal regardless of what else he is taking Probably needs to adjust his Regular Insulin based on how much he is eating He can take Humalog which he has at home also when he is going out to eat Discussed timing of Humalog which is a fast acting insulin  Needs to keep the dose of Toujeo unchanged for now unless fasting blood sugars start going higher or lower  Check A1c on the next visit More regular walking for exercise    Patient Instructions  MUST TAKE REGULAR INSULIN OR HUMALOG BEFORE EVERY MEAL  including breakfast: 35 UNITS, CAN take 5 units more or less if eating more or less  TOUJEO 35 IN AM DAILY; only adjust dose if am sugar stays high or low  Bydureon once weekly  Please check blood sugars at least half the time about 2 hours after any meal and 3 times per week on waking up.  Please bring blood sugar monitor to each visit. Recommended blood sugar levels about 2 hours after meal is 140-180 and on waking up 90-130  Tienen que tomar insulina regular o Humalog antes de cada comida, incluyendo el desayuno: 35 unidades, puede tomar 5 unidades ms o menos si comer ms o menos  TOUJEO 35 EN am todos los [address]  [id number]; Slo ajustar la dosis si el azcar de la maana se mantiene alta o baja  Bydureon una vez por semana  Por favor, compruebe azcar en la sangre al menos la mitad del tiempo cerca de 2 horas despus de cualquier comida y 3 veces por Building surveyorsemana en el despertar. Favor de traer monitor de [employer] en la sangre a  cada visita. Recomendado niveles de azcar en la sangre aproximadamente 2 horas despus de la comida es de 140-180 y al despertar 90-130    Counseling time on subjects discussed above is over 50% of today's 25 minute visit   Kaleiyah Polsky 03/12/2015, 3:08 PM   Note: This office note was prepared with Insurance underwriter. Any transcriptional errors that result from this process are unintentional.

## 2015-03-12 NOTE — Patient Instructions (Addendum)
MUST TAKE REGULAR INSULIN OR HUMALOG BEFORE EVERY MEAL including breakfast: 35 UNITS, CAN take 5 units more or less if eating more or less  TOUJEO 35 IN AM DAILY; only adjust dose if am sugar stays high or low  Bydureon once weekly  Please check blood sugars at least half the time about 2 hours after any meal and 3 times per week on waking up.  Please bring blood sugar monitor to each visit. Recommended blood sugar levels about 2 hours after meal is 140-180 and on waking up 90-130  Tienen que tomar insulina regular o Humalog antes de cada comida, incluyendo el desayuno: 35 unidades, puede tomar 5 unidades ms o menos si comer ms o menos  TOUJEO 35 EN am todos los [address]  [id number]; Slo ajustar la dosis si el azcar de la maana se mantiene alta o baja  Bydureon una vez por semana  Por favor, compruebe azcar en la sangre al menos la mitad del tiempo cerca de 2 horas despus de cualquier comida y 3 veces por Building surveyorsemana en el despertar. Favor de traer monitor de azcar en la sangre a cada visita. Recomendado niveles de azcar en la sangre aproximadamente 2 horas despus de la comida es de 140-180 y al despertar 90-130

## 2015-03-20 ENCOUNTER — Encounter: Payer: Medicare Other | Attending: Endocrinology | Admitting: Dietician

## 2015-03-20 ENCOUNTER — Encounter: Payer: Self-pay | Admitting: Dietician

## 2015-03-20 VITALS — Ht 62.0 in | Wt 214.0 lb

## 2015-03-20 DIAGNOSIS — Z713 Dietary counseling and surveillance: Secondary | ICD-10-CM | POA: Insufficient documentation

## 2015-03-20 DIAGNOSIS — Z6836 Body mass index (BMI) 36.0-36.9, adult: Secondary | ICD-10-CM | POA: Diagnosis not present

## 2015-03-20 DIAGNOSIS — E119 Type 2 diabetes mellitus without complications: Secondary | ICD-10-CM | POA: Insufficient documentation

## 2015-03-20 DIAGNOSIS — E785 Hyperlipidemia, unspecified: Secondary | ICD-10-CM | POA: Insufficient documentation

## 2015-03-20 DIAGNOSIS — E1165 Type 2 diabetes mellitus with hyperglycemia: Secondary | ICD-10-CM | POA: Insufficient documentation

## 2015-03-20 DIAGNOSIS — I10 Essential (primary) hypertension: Secondary | ICD-10-CM | POA: Insufficient documentation

## 2015-03-20 DIAGNOSIS — Z7982 Long term (current) use of aspirin: Secondary | ICD-10-CM | POA: Insufficient documentation

## 2015-03-20 DIAGNOSIS — Z794 Long term (current) use of insulin: Secondary | ICD-10-CM | POA: Insufficient documentation

## 2015-03-20 DIAGNOSIS — E669 Obesity, unspecified: Secondary | ICD-10-CM | POA: Insufficient documentation

## 2015-03-20 DIAGNOSIS — E118 Type 2 diabetes mellitus with unspecified complications: Secondary | ICD-10-CM

## 2015-03-20 DIAGNOSIS — Z79899 Other long term (current) drug therapy: Secondary | ICD-10-CM | POA: Diagnosis not present

## 2015-03-20 NOTE — Progress Notes (Signed)
  Medical Nutrition Therapy:  Appt start time: 1445 end time:  1600.   Assessment:  Primary concerns today: Patient is here today with his interpretor Pieter PartridgeGraciela Namilira.  He would like to learn more about nutrition to help control his diabetes.  Patient reports hx of DM for >20 years.  Additional hx of HTN.  He checks his blood sugar twice a day. Morning CBG 107 and 2 hours after dinner- 130.  HgbA1C 9.2% (01/23/15) decreased from 9.7% (06/17/14).  It appears that he is now taking his medication as prescribed.    Patient lives with wife and daughter who is a Engineer, petroleumpharmacy student.  He does the shopping and cooking.  Preferred Learning Style:   No preference indicated   Learning Readiness:   Ready  Change in progress   MEDICATIONS: see list which includes Bydureon, toujeo, Humulin R 100 units at breakfast and 35 units at lunch an dinner, and Glucophage 1000 mg bid.   DIETARY INTAKE: 24-hr recall:  B ( AM): coffee with cream and spenda and bread or sandwich with egg, bacon,  spinach and tomato on [employer] bread or tortillas with cheese Snk ( AM): nuts or sunflower seeds  L ( PM): vegetable soup vegetables, rice, leftovers, smoothi (watermelon, 2-3 strawberries, 2% milk) Snk ( PM): fruit D ( PM): fish or meat with salad, vegetables, tortillas, dried beans, rice(small amounts-dislikes) Snk ( PM): Beverages:  water, smoothie, coffee with cream and splenda  Usual physical activity: walks 1 mile daily  Estimated energy needs: 2000 calories 225 g carbohydrates 125 g protein 67 g fat  Progress Towards Goal(s):  In progress.   Nutritional Diagnosis:  NB-1.1 Food and nutrition-related knowledge deficit As related to balance of carbohydrates, protein, and fat.  As evidenced by patient report.    Intervention:  Nutrition counseling and diabetes education initiated. Discussed Carb Counting by food group as method of portion control, reading food labels, and benefits of increased activity. Also  discussed target BG ranges pre and post meals.  Also reviewed the Rule of 15 for treatment of hypoglycemia.  Patient tended to overtreat with too much carbohydrates.  Continue to walk 1 mile or more per day. Watch portion sizes and amounts of carbohydrates. Aim for 4 carbohydrate choices (60 grams) per meal Aim for 0-2 carbohydrate choices per snack if hungry. Bake, broil or grill and use little added fat. Half of the plate should be non-starchy vegetables.  Teaching Method Utilized:  Visual Auditory Hands on  Handouts given during visit include:  Spanish my plate  Spanish meal plan card  Spanish Living well with Diabetes  Label reading  Barriers to learning/adherence to lifestyle change: none  Demonstrated degree of understanding via:  Teach Back   Monitoring/Evaluation:  Dietary intake, exercise, label reading, and body weight prn.

## 2015-03-20 NOTE — Patient Instructions (Signed)
Continue to walk 1 mile or more per day. Watch portion sizes and amounts of carbohydrates. Aim for 4 carbohydrate choices (60 grams) per meal Aim for 0-2 carbohydrate choices per snack if hungry. Bake, broil or grill and use little added fat. Half of the plate should be non-starchy vegetables.

## 2015-04-22 ENCOUNTER — Encounter: Payer: Self-pay | Admitting: Endocrinology

## 2015-04-22 ENCOUNTER — Other Ambulatory Visit: Payer: Self-pay | Admitting: Endocrinology

## 2015-04-22 ENCOUNTER — Ambulatory Visit (INDEPENDENT_AMBULATORY_CARE_PROVIDER_SITE_OTHER): Payer: Medicare Other | Admitting: Endocrinology

## 2015-04-22 DIAGNOSIS — IMO0002 Reserved for concepts with insufficient information to code with codable children: Secondary | ICD-10-CM

## 2015-04-22 DIAGNOSIS — E1165 Type 2 diabetes mellitus with hyperglycemia: Secondary | ICD-10-CM

## 2015-04-22 LAB — POCT URINALYSIS DIPSTICK
Bilirubin, UA: NEGATIVE
Glucose, UA: NEGATIVE
Ketones, UA: NEGATIVE
Leukocytes, UA: NEGATIVE
Nitrite, UA: NEGATIVE
PH UA: 5
PROTEIN UA: NEGATIVE
RBC UA: NEGATIVE
SPEC GRAV UA: 1.015
UROBILINOGEN UA: 0.2

## 2015-04-22 LAB — COMPREHENSIVE METABOLIC PANEL
ALBUMIN: 4.4 g/dL (ref 3.5–5.2)
ALT: 26 U/L (ref 0–53)
AST: 24 U/L (ref 0–37)
Alkaline Phosphatase: 53 U/L (ref 39–117)
BILIRUBIN TOTAL: 0.5 mg/dL (ref 0.2–1.2)
BUN: 16 mg/dL (ref 6–23)
CO2: 29 mEq/L (ref 19–32)
Calcium: 9.4 mg/dL (ref 8.4–10.5)
Chloride: 104 mEq/L (ref 96–112)
Creatinine, Ser: 0.75 mg/dL (ref 0.40–1.50)
GFR: 111.51 mL/min (ref >60.00)
GLUCOSE: 121 mg/dL — AB (ref 70–99)
POTASSIUM: 4 meq/L (ref 3.5–5.1)
Sodium: 137 mEq/L (ref 135–145)
Total Protein: 6.9 g/dL (ref 6.0–8.3)

## 2015-04-22 LAB — MICROALBUMIN / CREATININE URINE RATIO
Creatinine,U: 88 mg/dL
MICROALB/CREAT RATIO: 0.8 mg/g (ref 0.0–30.0)
Microalb, Ur: <0.7 mg/dL (ref 0.0–1.9)

## 2015-04-22 LAB — HEMOGLOBIN A1C: Hgb A1c MFr Bld: 8.1 % — ABNORMAL HIGH (ref 4.6–6.5)

## 2015-04-22 MED ORDER — EXENATIDE ER 2 MG ~~LOC~~ PEN: PEN_INJECTOR | SUBCUTANEOUS | Status: DC

## 2015-04-22 NOTE — Progress Notes (Signed)
Patient ID: Samuel [name], male   DOB: 1951-07-06, 64 y.o.   MRN: [id number]            Reason for Appointment:  Followup for Type 2 Diabetes  Referring physician: [place]  History of Present Illness:          Diagnosis: Type 2 diabetes mellitus, date of diagnosis:  1990      Past history: He thinks he has been on insulin for the last 10-12 years. Previously had been on metformin which has been continued. He was probably tried on different insulin regimens initially but has been taking 70/30 mostly because of cost Previous records are not available and appears that his sugars have been poorly controlled for several years He was  referred here because of an A1c in 8/15 of 9.7%. He was on a regimen of Novolin 70/30 twice a day but because of inadequate control and higher readings later in the day he was switched to basal bolus insulin regimen in 12/15.  Recent history:  History obtained mostly through interpreter  INSULIN regimen is described as:  Toujeo 35 acb; taking Regular Insulin,  35-80 units before lunch and supper   His control is still very difficult and overall blood sugars are not well-controlled as judged by his home readings On his last visit he was instructed in detail about taking regular insulin with every meal including breakfast but he still does not understand this He has also been instructed by the nurse educator in detail Also ran out of Bydureon over a month ago and did not ask for a refill He thinks he has better control of his appetite with taking Bydureon However has lost a little weight Previously was taking 80 units of Toujeo and now he is taking only 35 No recent A1c available but it has been generally over 9%  Current blood sugar patterns and problems identified:  He is checking his blood sugars mostly before breakfast although is trying to do some readings and lunch in the evening also  Although his blood sugars are overall high in the morning they  appear to be relatively better in the last week or so  He gives conflicting answers about how much insulin he is taking for his meals and appears to be taking very arbitrary doses; last evening when his blood sugar was 260 at suppertime he took 80 units of regular insulin although generally otherwise taking 35-40  He has had occasional hypoglycemia after about 5 PM possibly from taking too much regular insulin also  He still does not take regular insulin at breakfast as he does not understand that he can take it even though he is taking Toujeo at the same time  Still taking metformin.  Blood sugars are somewhat better at suppertime compared to the last visit with taking Regular Insulin on some days; however still does not understand that he can take his Humalog when he is eating out with his pen       Oral hypoglycemic drugs the patient is taking are:   metformin 1 g twice a day     Side effects from medications have been: ?  Nausea/dizziness on Victoza  Compliance with the medical regimen: Poor  Hypoglycemia:   none   Glucose monitoring:  done twice a day         Glucometer:  One Touch ultra 2     Blood Glucose readings by time of day by review of meter download  Mean values apply  above for all meters except median for One Touch  PRE-MEAL Fasting Lunch Dinner Bedtime Overall  Glucose range: 90-236 70-254 55-260    Mean/median: 167 114 178 188 167   Self-care: The diet that the patient has been following is: None, not controlling portions and may have high carbohydrate meals especially breakfast    Meals: 3 meals per day. Breakfast is various kinds of bread, milk and eggs.  Dinner at 6 pm         Exercise: Walking about 1 mile, 5-7 days a week in the evenings           Dietician visit, most recent: 5 years ago.               Weight history: 200-220  Wt Readings from Last 3 Encounters:  04/22/15 210 lb (95.255 kg)  03/20/15 214 lb (97.07 kg)  03/12/15 211 lb 3.2 oz (95.8 kg)     Glycemic control:   Lab Results  Component Value Date   HGBA1C 8.1* 04/22/2015   HGBA1C 9.2* 01/13/2015   HGBA1C 9.7* 06/17/2014   Lab Results  Component Value Date   MICROALBUR <0.7 04/22/2015   LDLCALC 18 02/05/2015   CREATININE 0.75 04/22/2015         Medication List       This list is accurate as of: 04/22/15  9:54 PM.  Always use your most recent med list.               aspirin 325 MG tablet  Take 81 mg by mouth daily.     atorvastatin 40 MG tablet  Commonly known as:  LIPITOR  Take 1 tablet (40 mg total) by mouth daily. Take 1/2 tablet at bedtime     cholecalciferol 1000 UNITS tablet  Commonly known as:  VITAMIN D  Take 1,000 Units by mouth daily.     Vitamin D3 1000 UNITS Caps     Exenatide ER 2 MG Pen  Commonly known as:  BYDUREON  Inject contents of one pen per week     Insulin Glargine 300 UNIT/ML Sopn  Commonly known as:  TOUJEO SOLOSTAR  Inject 35 Units into the skin every morning.     Insulin Pen Needle 31G X 5 MM Misc  Use 5 per day     insulin regular 100 units/mL injection  Commonly known as:  HUMULIN R  Inject 25 units at breakfast and 35 units at lunch and dinner     Insulin Syringe-Needle U-100 30G X 5/16" 0.5 ML Misc  Use to inject insulin     INSULIN SYRINGE 1CC/30GX5/16" 30G X 5/16" 1 ML Misc     losartan 50 MG tablet  Commonly known as:  COZAAR  Take 1 tablet (50 mg total) by mouth daily.     metFORMIN 1000 MG tablet  Commonly known as:  GLUCOPHAGE  Take 1 tablet (1,000 mg total) by mouth 2 (two) times daily with a meal.     ONE TOUCH ULTRA TEST test strip  Generic drug:  glucose blood        Allergies: No Known Allergies  Past Medical History  Diagnosis Date  . Hypertension   . Diabetes mellitus without complication   . Blood transfusion without reported diagnosis   . History of ETOH abuse     Past Surgical History  Procedure Laterality Date  . Back surgery N/A 2010    Family History  Problem  Relation Age of Onset  . Diabetes    .  Hypertension    . Arthritis Father   . Diabetes Father     Social History:  reports that he has quit smoking. He quit smokeless tobacco use about 21 years ago. He reports that he does not drink alcohol or use illicit drugs.    Review of Systems        Most recent eye exam was ?  2 years ago, reportedly normal.  He does not have current ophthalmologist       Lipids:  He has been on Lipitor forsince about 2011 good control       Lab Results  Component Value Date   CHOL 80 02/05/2015   HDL 39.60 02/05/2015   LDLCALC 18 02/05/2015   TRIG 111.0 02/05/2015   CHOLHDL 2 02/05/2015       The blood pressure has been high for 2 years, treated with lisinopril   Last foot exam was in 07/2014        Physical Examination:  BP 122/70 mmHg  Pulse 73  Temp(Src) 98.7 F (37.1 C) (Oral)  Resp 16  Ht 5' 4.5" (1.638 m)  Wt 210 lb (95.255 kg)  BMI 35.50 kg/m2  SpO2 96%     ASSESSMENT:  Diabetes type 2, uncontrolled with obesity and BMI of 36 See history of present illness for detailed discussion of his current management, blood sugar patterns and problems identified Despite repeated explanation of how to take his insulin and rationale for taking basal and bolus insulin doses he is still not following his instructions Although his insulin resistant and has taken as much as 80 units of regular insulin at a meal he is still taking only small amounts of basal insulin and recently fasting blood sugars are better He has significant variability in his postprandial readings especially around suppertime and bedtime based on his compliance Also may have done better with his diet when taking by during which he did not ask for a refill  He has been consistently given instructions in Spanish also but does not appear to understand them and has not also benefited from talking to the nurse educator    PLAN:   He was given printed instructions and reviewed  these in detail including translation in Spanish  Showed him action profile of basal and bolus insulin and he thinks he can understand the need for taking regular insulin with every meal including breakfast Currently not able to adjust the dose of his mealtime insulin as he is not taking this consistently and not clear what his patterns are when he takes consistent doses with every meal RESTART Bydureon  Needs to keep the dose of Toujeo unchanged for now unless fasting blood sugars start going higher or lower Check A1c today  Counseling time on subjects discussed above is over 50% of today's 25 minute visit   Patient Instructions  Must take Regular 15-30 min before every meal, try 30-40 units based on amount of Carbs and size of meal  Toujeo 35 daily, this keeps sugar down when not eating  Check blood sugars on waking up .4-5.  .. times a week Also check blood sugars about 2-3 hours after a meal and do this after different meals by rotation  Recommended blood sugar levels on waking up is 90-130 and about 2 hours after meal is 140-180 Please bring blood sugar monitor to each visit.  Schedule eye appt.  Debe tomar regular de 15-30 minutos antes de cada comida, trate de 30-40 unidades de base a la  cantidad de hidratos de carbono y las dimensiones de la harina  Toujeo 35 todos [address], lo que mantiene el azcar hacia abajo cuando no comer  Compruebe azcar en la sangre durante el despertar .4-5. .. veces a la semana Tambin puedes ver los niveles de azcar alrededor de 2-3 horas despus de Neomia Dear comida y hacer esto despus de diferentes comidas por la rotacin  Recomendado niveles de azcar en la sangre durante el despertar es 90-130 y aproximadamente 2 horas despus de la comida es de 140-180 Favor de traer monitor de Banker a cada visita.    Counseling time on subjects discussed above is over 50% of today's 25 minute visit   Samuel [name] 04/22/2015, 9:54 PM   Note:  This office note was prepared with Insurance underwriter. Any transcriptional errors that result from this process are unintentional.

## 2015-04-22 NOTE — Patient Instructions (Addendum)
Must take Regular 15-30 min before every meal, try 30-40 units based on amount of Carbs and size of meal  Toujeo 35 daily, this keeps sugar down when not eating  Check blood sugars on waking up .4-5.  .. times a week Also check blood sugars about 2-3 hours after a meal and do this after different meals by rotation  Recommended blood sugar levels on waking up is 90-130 and about 2 hours after meal is 140-180 Please bring blood sugar monitor to each visit.  Schedule eye appt.  Debe tomar regular de 15-30 minutos antes de cada comida, trate de 30-40 unidades de base a la cantidad de hidratos de carbono y las dimensiones de la harina  Toujeo 35 todos los [place], lo que mantiene el azcar hacia abajo cuando no comer  Compruebe azcar en la sangre durante el despertar .4-5. .. veces a la semana Tambin puedes ver los niveles de azcar alrededor de 2-3 horas despus de Neomia Dear comida y hacer esto despus de diferentes comidas por la rotacin  Recomendado niveles de azcar en la sangre durante el despertar es 90-130 y aproximadamente 2 horas despus de la comida es de 140-180 Favor de traer monitor de Banker a cada visita.

## 2015-04-22 NOTE — Progress Notes (Signed)
Quick Note:  Please let patient know that the A1c sl better but still shows average sugar 185, high  ______

## 2015-05-01 ENCOUNTER — Encounter: Payer: Self-pay | Admitting: Medical

## 2015-05-01 ENCOUNTER — Ambulatory Visit (HOSPITAL_BASED_OUTPATIENT_CLINIC_OR_DEPARTMENT_OTHER)
Admission: RE | Admit: 2015-05-01 | Discharge: 2015-05-01 | Disposition: A | Discharge status: Home / Self Care | Payer: Medicare Other | Source: Ambulatory Visit | Attending: Medical | Admitting: Medical

## 2015-05-01 ENCOUNTER — Ambulatory Visit (INDEPENDENT_AMBULATORY_CARE_PROVIDER_SITE_OTHER): Payer: Medicare Other | Admitting: Medical

## 2015-05-01 VITALS — BP 131/64 | HR 67 | Temp 98.0°F | Ht 64.5 in | Wt 211.8 lb

## 2015-05-01 DIAGNOSIS — M79671 Pain in right foot: Secondary | ICD-10-CM | POA: Diagnosis present

## 2015-05-01 DIAGNOSIS — E1340 Other specified diabetes mellitus with diabetic neuropathy, unspecified: Secondary | ICD-10-CM | POA: Insufficient documentation

## 2015-05-01 DIAGNOSIS — M25551 Pain in right hip: Secondary | ICD-10-CM

## 2015-05-01 DIAGNOSIS — I10 Essential (primary) hypertension: Secondary | ICD-10-CM | POA: Diagnosis not present

## 2015-05-01 DIAGNOSIS — M65871 Other synovitis and tenosynovitis, right ankle and foot: Secondary | ICD-10-CM | POA: Insufficient documentation

## 2015-05-01 DIAGNOSIS — M778 Other enthesopathies, not elsewhere classified: Secondary | ICD-10-CM | POA: Insufficient documentation

## 2015-05-01 DIAGNOSIS — E785 Hyperlipidemia, unspecified: Secondary | ICD-10-CM

## 2015-05-01 DIAGNOSIS — M25552 Pain in left hip: Secondary | ICD-10-CM | POA: Diagnosis not present

## 2015-05-01 MED ORDER — GABAPENTIN 100 MG PO CAPS: 100.0000 mg | ORAL_CAPSULE | Freq: Three times a day (TID) | ORAL | Status: DC

## 2015-05-01 MED ORDER — DICLOFENAC SODIUM 75 MG PO TBEC: 75.0000 mg | DELAYED_RELEASE_TABLET | Freq: Two times a day (BID) | ORAL | Status: DC

## 2015-05-01 NOTE — Assessment & Plan Note (Signed)
Xray of foot/heel. Dr.Scholl heal pads. Rx diclofenac.

## 2015-05-01 NOTE — Progress Notes (Signed)
Pre visit review using our clinic review tool, if applicable. No additional management support is needed unless otherwise documented below in the visit note. 

## 2015-05-01 NOTE — Assessment & Plan Note (Signed)
Stay on current bp meds. Bp controlled today.

## 2015-05-01 NOTE — Assessment & Plan Note (Signed)
Stay on tx per endocrinologist.

## 2015-05-01 NOTE — Assessment & Plan Note (Signed)
Pt had this pain in past. Xray was negative one year ago. So will go ahead and refer to orthopedist for possible trochanteric bursitis. Rx diclofenac.

## 2015-05-01 NOTE — Patient Instructions (Addendum)
Hip pain Pt had this pain in past. Xray was negative one year ago. So will go ahead and refer to orthopedist for possible trochanteric bursitis. Rx diclofenac.  Pain of right heel Xray of foot/heel. Dr.Scholl heal pads. Rx diclofenac.  Type II or unspecified type diabetes mellitus without mention of complication, not stated as uncontrolled Stay on tx per endocrinologist.  Essential hypertension, benign Stay on current bp meds. Bp controlled today.  Hyperlipidemia Well controlled last 2 lipid panel. Will recheck lipid panel in 3 months.  Neuropathy due to secondary diabetes mellitus Burning of his feet may represent diabetic neuropathy. Rx neurontin.   Follow up in 3 months or as needed

## 2015-05-01 NOTE — Assessment & Plan Note (Signed)
Well controlled last 2 lipid panel. Will recheck lipid panel in 3 months.

## 2015-05-01 NOTE — Progress Notes (Signed)
Subjective:    Patient ID: Samuel [name], male    DOB: 07-08-1951, 64 y.o.   MRN: [id number]  HPI   Pt states feel ok. Has hip pain at night. Hx of back pain in past.  I sent him to get MRI in the past. Some daily radiating pain to left leg but he states pain is from the hip area. Pt has had surgery low back with Dr. Everardo All in [employer]. Pt actually clarifies that pain is in both hips. His back is not hurting as before.   Pt states some burning sensation at time to both feet. Past months.   Also some heal pain rt side for 4 months. Worse in am. When he first gets out of bed.  Pt bp is controlled today. No neuro or cardiac signs or symptoms.  Pt had a1c was 8.1. Pt blood sugar average 185.   Pt lipid panel was contolled March, 2016.      Review of Systems  Constitutional: Negative for fever, chills, diaphoresis, activity change and fatigue.  Respiratory: Negative for cough, chest tightness and shortness of breath.   Cardiovascular: Negative for chest pain, palpitations and leg swelling.  Gastrointestinal: Negative for nausea, vomiting and abdominal pain.  Musculoskeletal: Negative for neck pain and neck stiffness.       Hip pain bilateral.   Rt heel pain.  Neurological: Negative for dizziness, tremors, seizures, syncope, facial asymmetry, speech difficulty, weakness, light-headedness, numbness and headaches.  Psychiatric/Behavioral: Negative for behavioral problems, confusion and agitation. The patient is not nervous/anxious.     Past Medical History  Diagnosis Date  . Hypertension   . Diabetes mellitus without complication   . Blood transfusion without reported diagnosis   . History of ETOH abuse     History   Social History  . Marital Status: Married    Spouse Name: N/A  . Number of Children: N/A  . Years of Education: N/A   Occupational History  . Not on file.   Social History Main Topics  . Smoking status: Former Games developer  . Smokeless tobacco: Former Neurosurgeon    Quit date: 11/08/1993  . Alcohol Use: No  . Drug Use: No  . Sexual Activity: Not on file   Other Topics Concern  . Not on file   Social History Narrative    Past Surgical History  Procedure Laterality Date  . Back surgery N/A 2010    Family History  Problem Relation Age of Onset  . Diabetes    . Hypertension    . Arthritis Father   . Diabetes Father     No Known Allergies  Current Outpatient Prescriptions on File Prior to Visit  Medication Sig Dispense Refill  . aspirin 325 MG tablet Take 81 mg by mouth daily.     [name] atorvastatin (LIPITOR) 40 MG tablet Take 1 tablet (40 mg total) by mouth daily. Take 1/2 tablet at bedtime 45 tablet 1  . cholecalciferol (VITAMIN D) 1000 UNITS tablet Take 1,000 Units by mouth daily.    . Cholecalciferol (VITAMIN D3) 1000 UNITS CAPS   6  . Exenatide ER (BYDUREON) 2 MG PEN Inject contents of one pen per week 4 each 3  . Insulin Glargine (TOUJEO SOLOSTAR) 300 UNIT/ML SOPN Inject 35 Units into the skin every morning. 3 pen 3  . Insulin Pen Needle 31G X 5 MM MISC Use 5 per day (Patient not taking: Reported on 04/22/2015) 150 each 3  . insulin regular (HUMULIN R) 100 units/mL injection  Inject 25 units at breakfast and 35 units at lunch and dinner 20 mL 3  . Insulin Syringe-Needle U-100 (INSULIN SYRINGE 1CC/30GX5/16") 30G X 5/16" 1 ML MISC   5  . Insulin Syringe-Needle U-100 30G X 5/16" 0.5 ML MISC Use to inject insulin 100 each 2  . losartan (COZAAR) 50 MG tablet Take 1 tablet (50 mg total) by mouth daily. 90 tablet 3  . metFORMIN (GLUCOPHAGE) 1000 MG tablet Take 1 tablet (1,000 mg total) by mouth 2 (two) times daily with a meal. 60 tablet 3  . ONE TOUCH ULTRA TEST test strip   5   No current facility-administered medications on file prior to visit.    BP 131/64 mmHg  Pulse 67  Temp(Src) 98 F (36.7 C) (Oral)  Ht 5' 4.5" (1.638 m)  Wt 211 lb 12.8 oz (96.072 kg)  BMI 35.81 kg/m2  SpO2 98%       Objective:   Physical  Exam  General Mental Status- Alert. General Appearance- Not in acute distress.   Skin General: Color- Normal Color. Moisture- Normal Moisture.  Neck Carotid Arteries- Normal color. Moisture- Normal Moisture. No carotid bruits. No JVD.  Chest and Lung Exam Auscultation: Breath Sounds:-Normal.  Cardiovascular Auscultation:Rythm- Regular. Murmurs & Other Heart Sounds:Auscultation of the heart reveals- No Murmurs.  Abdomen Inspection:-Inspeection Normal. Palpation/Percussion:Note:No mass. Palpation and Percussion of the abdomen reveal- Non Tender, Non Distended + BS, no rebound or guarding.    Neurologic Cranial Nerve exam:- CN III-XII intact(No nystagmus), symmetric smile. Strength:- 5/5 equal and symmetric strength both upper and lower extremities.  Rt heel- mild pain on palpation. Direct over calcaneus.   Back Mid no  lumbar spine tenderness to palpation. Pain on straight leg lift.(none) Pain on lateral movements and flexion/extension of the spine.(none) Lower ext neurologic  L5-S1 sensation intact bilaterally. Normal patellar reflexes bilaterally. No foot drop bilaterally.  Hips- Rt side mild pain on rom. Lt side- mild- moderate. Pain on palpation.     Assessment & Plan:

## 2015-05-01 NOTE — Assessment & Plan Note (Signed)
Burning of his feet may represent diabetic neuropathy. Rx neurontin.

## 2015-05-05 ENCOUNTER — Other Ambulatory Visit: Payer: Self-pay | Admitting: Medical

## 2015-05-09 ENCOUNTER — Other Ambulatory Visit: Payer: Self-pay | Admitting: Endocrinology

## 2015-05-16 ENCOUNTER — Encounter: Payer: Self-pay | Admitting: Medical

## 2015-05-20 ENCOUNTER — Other Ambulatory Visit: Payer: Self-pay | Admitting: Endocrinology

## 2015-06-23 ENCOUNTER — Encounter: Payer: Self-pay | Admitting: Endocrinology

## 2015-06-23 ENCOUNTER — Ambulatory Visit (INDEPENDENT_AMBULATORY_CARE_PROVIDER_SITE_OTHER): Payer: Medicare Other | Admitting: Endocrinology

## 2015-06-23 ENCOUNTER — Ambulatory Visit: Payer: Medicare Other | Admitting: Endocrinology

## 2015-06-23 VITALS — BP 124/60 | HR 69 | Temp 98.0°F | Resp 16 | Ht 64.5 in | Wt 210.2 lb

## 2015-06-23 DIAGNOSIS — IMO0002 Reserved for concepts with insufficient information to code with codable children: Secondary | ICD-10-CM

## 2015-06-23 DIAGNOSIS — E1165 Type 2 diabetes mellitus with hyperglycemia: Secondary | ICD-10-CM | POA: Diagnosis not present

## 2015-06-23 NOTE — Patient Instructions (Signed)
Must take Humalog  15-30 min before every meal, try 40-50 units based on amount of Carbs and size of meal  Toujeo 30 daily, this keeps sugar down when not eating  Check blood sugars on waking up .4-5. .. times a week  Also check blood sugars about 2-3 hours after a meal and do this after different meals by rotation  Recommended blood sugar levels on waking up is 90-130 and about 2 hours after meal is 140-180  Please bring blood sugar monitor to each visit.    Debe tomar Humalog de 15-30 minutos antes de cada comida, trate de 40-50 unidades de base a la cantidad de hidratos de carbono y las dimensiones de la harina  Toujeo 30 todos los [place], lo que mantiene el azcar hacia abajo cuando no comer  Compruebe azcar en la sangre durante el despertar .4-5. .. veces a la semana Tambin puedes ver los niveles de azcar alrededor de 2-3 horas despus de Neomia Dear comida y hacer esto despus de diferentes comidas por la rotacin  Recomendado niveles de azcar en la sangre durante el despertar es 90-130 y aproximadamente 2 horas despus de la comida es de 140-180 Favor de traer monitor de Banker a cada visita.

## 2015-06-23 NOTE — Progress Notes (Signed)
Patient ID: Samuel [name], male   DOB: [birth year], 64 y.o.   MRN: [id number]            Reason for Appointment:  Followup for Type 2 Diabetes  Referring physician: [place]  History of Present Illness:          Diagnosis: Type 2 diabetes mellitus, date of diagnosis:  1990      Past history: He thinks he has been on insulin for the last 10-12 years. Previously had been on metformin which has been continued. He was probably tried on different insulin regimens initially but has been taking 70/30 mostly because of cost Previous records are not available and appears that his sugars have been poorly controlled for several years He was  referred here because of an A1c in 8/15 of 9.7%. He was on a regimen of Novolin 70/30 twice a day but because of inadequate control and higher readings later in the day he was switched to basal bolus insulin regimen in 12/15.  Recent history:  History obtained mostly through interpreter  INSULIN regimen is described as:  Humalog 60 units before breakfast and supper recently, not taking Toujeo   His control is still very difficult and overall blood sugars are not well-controlled as judged by his home readings No recent A1c available but it has been generally over 8-9%  On each visit he has been instructed in detail about how he should be taking his insulin regimen for both the basal and bolus insulin doses However he again is following his own doses and regimen and it is different every time He says that the insurance company doctor told him not to take Toujeo because it would cause low sugars Previously taking 80 units of Toujeo and more recently has not taken any for at least a week He has also been instructed by the nurse educator in detail  He previously said that has better control of his appetite with taking Bydureon but now he says that this does not make a difference and does not want to take it Also does not want to take multiple injections  Current  blood sugar patterns and problems identified:  He is checking his blood sugars mostly before breakfast and these are quite variable although relatively better until about 2 weeks ago  His blood sugars are progressively increasing in the afternoons and evenings and usually markedly increased after supper at night  This is despite his recently taking as much of 60 units of Humalog before meals; however has done only one reading after evening meal in the last week  He still does not take any insulin for his lunch despite instructions  His overall blood sugars are higher compared to his last visit       Oral hypoglycemic drugs the patient is taking are:   metformin 1 g twice a day     Side effects from medications have been: ?  Nausea/dizziness on Victoza  Compliance with the medical regimen: Poor  Hypoglycemia:   none   Glucose monitoring:  done once or twice a day         Glucometer:  One Touch ultra 2     Blood Glucose readings by time of day by review of meter download  Mean values apply above for all meters except median for One Touch  PRE-MEAL Fasting Lunch Dinner Bedtime Overall  Glucose range: 67-245  131-250   82-291   192-355    Mean/median:  135  192     Self-care: The diet that the patient has been following is: None, not controlling portions and may have high carbohydrate meals especially breakfast    Meals: 3 meals per day. Breakfast is various kinds of bread, milk and eggs.  Dinner at 6 pm         Exercise: Walking about 1 mile, 5-7 days a week in the evenings           Dietician visit, most recent: 5 years ago.               Weight history: 200-220  Wt Readings from Last 3 Encounters:  06/23/15 210 lb 3.2 oz (95.346 kg)  05/01/15 211 lb 12.8 oz (96.072 kg)  04/22/15 210 lb (95.255 kg)    Glycemic control:   Lab Results  Component Value Date   HGBA1C 8.1* 04/22/2015   HGBA1C 9.2* 01/13/2015   HGBA1C 9.7* 06/17/2014   Lab Results  Component Value Date     MICROALBUR <0.7 04/22/2015   LDLCALC 18 02/05/2015   CREATININE 0.75 04/22/2015         Medication List       This list is accurate as of: 06/23/15 11:59 PM.  Always use your most recent med list.               aspirin 325 MG tablet  Take 81 mg by mouth daily.     atorvastatin 40 MG tablet  Commonly known as:  LIPITOR  TAKE 1 TABLET BY MOUTH DAILY AND ONE-HALF TABLET AT BEDTIME     cholecalciferol 1000 UNITS tablet  Commonly known as:  VITAMIN D  Take 1,000 Units by mouth daily.     Vitamin D3 1000 UNITS Caps     Exenatide ER 2 MG Pen  Commonly known as:  BYDUREON  Inject contents of one pen per week     HUMALOG KWIKPEN 100 UNIT/ML KiwkPen  Generic drug:  insulin lispro  ADM 35 UNITS [place] B MEALS     Insulin Glargine 300 UNIT/ML Sopn  Commonly known as:  TOUJEO SOLOSTAR  Inject 35 Units into the skin every morning.     Insulin Pen Needle 31G X 5 MM Misc  Use 5 per day     Insulin Syringe-Needle U-100 30G X 5/16" 0.5 ML Misc  Use to inject insulin     INSULIN SYRINGE 1CC/30GX5/16" 30G X 5/16" 1 ML Misc     losartan 50 MG tablet  Commonly known as:  COZAAR  Take 1 tablet (50 mg total) by mouth daily.     metFORMIN 1000 MG tablet  Commonly known as:  GLUCOPHAGE  TAKE 1 TABLET BY MOUTH TWICE DAILY WITH A MEAL     ONE TOUCH ULTRA TEST test strip  Generic drug:  glucose blood  CHECK BLOOD SUGAR THREE TIMES DAILY        Allergies: No Known Allergies  Past Medical History  Diagnosis Date  . Hypertension   . Diabetes mellitus without complication   . Blood transfusion without reported diagnosis   . History of ETOH abuse     Past Surgical History  Procedure Laterality Date  . Back surgery N/A 2010    Family History  Problem Relation Age of Onset  . Diabetes    . Hypertension    . Arthritis Father   . Diabetes Father     Social History:  reports that he has quit smoking. He quit smokeless tobacco use about 21  years ago. He reports that he  does not drink alcohol or use illicit drugs.    Review of Systems        Most recent eye exam was ?  2 years ago, reportedly normal.  He still does not have an ophthalmologist       Lipids:  He has been on Lipitor for hypercholesterolemia since about 2011 good control       Lab Results  Component Value Date   CHOL 80 02/05/2015   HDL 39.60 02/05/2015   LDLCALC 18 02/05/2015   TRIG 111.0 02/05/2015   CHOLHDL 2 02/05/2015       The blood pressure has been high for 2 years, treated with lisinopril   Last foot exam was in 07/2014        Physical Examination:  BP 124/60 mmHg  Pulse 69  Temp(Src) 98 F (36.7 C)  Resp 16  Ht 5' 4.5" (1.638 m)  Wt 210 lb 3.2 oz (95.346 kg)  BMI 35.54 kg/m2  SpO2 95%     ASSESSMENT:  Diabetes type 2, uncontrolled with obesity and BMI of 36 See history of present illness for detailed discussion of his current management, blood sugar patterns and problems identified Despite repeated explanation of how to take his insulin and rationale for taking basal and bolus insulin doses on each visit he is still not following his instructions He is relatively insulin resistant and has taken as much as 60 units of Humalog at mealtimes without adequate control However also he has not taken any insulin to cover his lunch meal Currently not taking any basal insulin as he misunderstood and also does not like to take more than 2 injections a day He is refusing to take Bydureon stating that now he does not think it causes more satiety and does not like multiple injection He has been consistently given instructions in Spanish also but does not appear to understand them and has not also benefited from talking to the nurse educator    PLAN:   He was given printed instructions and reviewed these in detail including translation in Spanish  Again discussed the need for having basal and bolus insulin regimen with covering each meal   he refuses to consider the V-go  pump  He wants to stop Bydureon  Discussed starting small doses of Toujeo for basal insulin.  May consider taking this in the morning only.  He probably will not understand how to adjust the dose based on fasting blood sugar patterns  For now he will take 40-50 units of Humalog based on his meal size and carbohydrate  Recommended follow-up with nurse educator  Check A1c on the next visit  Counseling time on subjects discussed above is over 50% of today's 25 minute visit   Patient Instructions  Must take Humalog  15-30 min before every meal, try 40-50 units based on amount of Carbs and size of meal  Toujeo 30 daily, this keeps sugar down when not eating  Check blood sugars on waking up .4-5. .. times a week  Also check blood sugars about 2-3 hours after a meal and do this after different meals by rotation  Recommended blood sugar levels on waking up is 90-130 and about 2 hours after meal is 140-180  Please bring blood sugar monitor to each visit.    Debe tomar Humalog de 15-30 minutos antes de cada comida, trate de 40-50 unidades de base a la cantidad de hidratos de carbono y las  dimensiones de la harina  Toujeo 30 [employer], lo que mantiene el azcar hacia abajo cuando no comer  Compruebe azcar en la sangre durante el despertar .4-5. .. veces a la semana Tambin puedes ver los niveles de azcar alrededor de 2-3 horas despus de [place] Dear comida y hacer esto despus de diferentes comidas por la rotacin  Recomendado niveles de azcar en la sangre durante el despertar es 90-130 y aproximadamente 2 horas despus de la comida es de 140-180 Favor de traer monitor de Banker a cada visita.    Counseling time on subjects discussed above is over 50% of today's 25 minute visit   Samuel [name] 06/24/2015, 11:59 AM   Note: This office note was prepared with Insurance underwriter. Any transcriptional errors that result from this process are unintentional.

## 2015-06-24 ENCOUNTER — Telehealth: Payer: Self-pay | Admitting: *Deleted

## 2015-06-24 NOTE — Telephone Encounter (Signed)
Prior auth for atorvastatin 40 mg quantity limit exception of 1.5 tabs per day initiated. Awaiting determination. JG/CMA

## 2015-07-02 NOTE — Telephone Encounter (Signed)
Approved through 11/08/2015. Approval letter sent for scanning. JG//CMA  

## 2015-07-14 ENCOUNTER — Other Ambulatory Visit: Payer: Self-pay | Admitting: Endocrinology

## 2015-08-07 ENCOUNTER — Ambulatory Visit: Payer: Medicare Other | Admitting: Medical

## 2015-08-11 ENCOUNTER — Ambulatory Visit (INDEPENDENT_AMBULATORY_CARE_PROVIDER_SITE_OTHER): Payer: Medicare Other | Admitting: Medical

## 2015-08-11 ENCOUNTER — Encounter: Payer: Self-pay | Admitting: Medical

## 2015-08-11 VITALS — BP 128/74 | HR 68 | Temp 98.3°F | Ht 64.5 in | Wt 219.0 lb

## 2015-08-11 DIAGNOSIS — I10 Essential (primary) hypertension: Secondary | ICD-10-CM | POA: Diagnosis not present

## 2015-08-11 DIAGNOSIS — E119 Type 2 diabetes mellitus without complications: Secondary | ICD-10-CM

## 2015-08-11 DIAGNOSIS — M5489 Other dorsalgia: Secondary | ICD-10-CM

## 2015-08-11 DIAGNOSIS — E785 Hyperlipidemia, unspecified: Secondary | ICD-10-CM

## 2015-08-11 DIAGNOSIS — Z794 Long term (current) use of insulin: Secondary | ICD-10-CM

## 2015-08-11 NOTE — Progress Notes (Signed)
Subjective:    Patient ID: Samuel [name], male    DOB: 12-26-1950, 64 y.o.   MRN: [id number]  HPI   Pt in for follow up.  His bp is controlled today and has been controlled recently on last visit. No cardiac or neurologic signs or symptoms. At endocrinoloigst good bp level as well.  Pt is diabetic and has been seeing endocrinologist.  Pt last a1-c was 9.7 on 06/22/2014. Pt is on basal insulin toujeo. Also on humalog. And metformin. Pt had microalubin/creatine ratio done in 04-2015. Pt states since last visit he has been more compliant. Pt states recently 120-130 recently.  Pt last lipid checked in march showed good levels. Pt is not fasting today.  Pt had some low back pain. Pt went to specialist and they referred him to PT. Pt states therapy was too expensive since cost him $35. So pt states he may just do exercises at home.     Review of Systems  Constitutional: Negative for fever, chills, diaphoresis, activity change and fatigue.  Respiratory: Negative for cough, chest tightness and shortness of breath.   Cardiovascular: Negative for chest pain, palpitations and leg swelling.  Gastrointestinal: Negative for nausea, vomiting and abdominal pain.  Musculoskeletal: Negative for neck pain and neck stiffness.  Neurological: Negative for dizziness, tremors, seizures, syncope, facial asymmetry, speech difficulty, weakness, light-headedness, numbness and headaches.  Psychiatric/Behavioral: Negative for behavioral problems, confusion and agitation. The patient is not nervous/anxious.       Past Medical History  Diagnosis Date  . Hypertension   . Diabetes mellitus without complication (HCC)   . Blood transfusion without reported diagnosis   . History of ETOH abuse     Social History   Social History  . Marital Status: Married    Spouse Name: N/A  . Number of Children: N/A  . Years of Education: N/A   Occupational History  . Not on file.   Social History Main Topics  .  Smoking status: Former Games developer  . Smokeless tobacco: Former Neurosurgeon    Quit date: 11/08/1993  . Alcohol Use: No  . Drug Use: No  . Sexual Activity: Not on file   Other Topics Concern  . Not on file   Social History Narrative    Past Surgical History  Procedure Laterality Date  . Back surgery N/A 2010    Family History  Problem Relation Age of Onset  . Diabetes    . Hypertension    . Arthritis Father   . Diabetes Father     No Known Allergies  Current Outpatient Prescriptions on File Prior to Visit  Medication Sig Dispense Refill  . aspirin 325 MG tablet Take 81 mg by mouth daily.     [name] atorvastatin (LIPITOR) 40 MG tablet TAKE 1 TABLET BY MOUTH DAILY AND ONE-HALF TABLET AT BEDTIME 45 tablet 3  . cholecalciferol (VITAMIN D) 1000 UNITS tablet Take 1,000 Units by mouth daily.    . Cholecalciferol (VITAMIN D3) 1000 UNITS CAPS   6  . HUMALOG KWIKPEN 100 UNIT/ML KiwkPen ADM 35 UNITS [place] B MEALS  3  . HUMALOG KWIKPEN 100 UNIT/ML KiwkPen ADMINISTER 35 UNITS UNDER THE SKIN BEFORE MEALS 90 mL 1  . Insulin Glargine (TOUJEO SOLOSTAR) 300 UNIT/ML SOPN Inject 35 Units into the skin every morning. 3 pen 3  . Insulin Pen Needle 31G X 5 MM MISC Use 5 per day 150 each 3  . losartan (COZAAR) 50 MG tablet Take 1 tablet (50 mg total) by  mouth daily. 90 tablet 3  . metFORMIN (GLUCOPHAGE) 1000 MG tablet TAKE 1 TABLET BY MOUTH TWICE DAILY WITH A MEAL 180 tablet 2  . ONE TOUCH ULTRA TEST test strip CHECK BLOOD SUGAR THREE TIMES DAILY 300 each 1   No current facility-administered medications on file prior to visit.    BP 128/74 mmHg  Pulse 68  Temp(Src) 98.3 F (36.8 C) (Oral)  Ht 5' 4.5" (1.638 m)  Wt 219 lb (99.338 kg)  BMI 37.02 kg/m2  SpO2 98%       Objective:   Physical Exam  General Mental Status- Alert. General Appearance- Not in acute distress.   Skin General: Color- Normal Color. Moisture- Normal Moisture.  Neck Carotid Arteries- Normal color. Moisture- Normal Moisture. No  carotid bruits. No JVD.  Chest and Lung Exam Auscultation: Breath Sounds:-Normal.  Cardiovascular Auscultation:Rythm- Regular. Murmurs & Other Heart Sounds:Auscultation of the heart reveals- No Murmurs.  Abdomen Inspection:-Inspeection Normal. Palpation/Percussion:Note:No mass. Palpation and Percussion of the abdomen reveal- Non Tender, Non Distended + BS, no rebound or guarding.   Neurologic Cranial Nerve exam:- CN III-XII intact(No nystagmus), symmetric smile. Strength:- 5/5 equal and symmetric strength both upper and lower extremities.  Lower ext- no breakdown or ulceration. Sensation intact. Pulses intact. See quality metric section.      Assessment & Plan:  Your blood pressure is well controlled today. Continue coozar.  I do encourage better compliance with endocrinologist plan based on last a1-c.   For your hx of hyperlipidemia in the past please get fasting lipid panel within next month. You could schedule appointment with lab as you leave.  With your history of back pain and leg symptoms in the past I would continue exercises that  therapy PT recommended. If this symptoms persist could again refer you to specialist.  Follow up in 3 month or as needed

## 2015-08-11 NOTE — Progress Notes (Signed)
Pre visit review using our clinic review tool, if applicable. No additional management support is needed unless otherwise documented below in the visit note. 

## 2015-08-11 NOTE — Patient Instructions (Addendum)
Your blood pressure is well controlled today. Continue coozar.  I do encourage better compliance with endocrinologist plan based on last a1-c.   For your hx of hyperlipidemia in the past please get fasting lipid panel within next month. You could schedule appointment with lab as you leave.  With your history of back pain and leg symptoms in the past I would continue exercises that  therapy PT recommended. If this symptoms persist could again refer you to specialist.  Follow up in 3 month or as needed

## 2015-08-12 ENCOUNTER — Other Ambulatory Visit (INDEPENDENT_AMBULATORY_CARE_PROVIDER_SITE_OTHER): Payer: Medicare Other

## 2015-08-12 DIAGNOSIS — E785 Hyperlipidemia, unspecified: Secondary | ICD-10-CM | POA: Diagnosis not present

## 2015-08-12 LAB — COMPREHENSIVE METABOLIC PANEL
ALK PHOS: 41 U/L (ref 39–117)
ALT: 25 U/L (ref 0–53)
AST: 23 U/L (ref 0–37)
Albumin: 4.1 g/dL (ref 3.5–5.2)
BILIRUBIN TOTAL: 0.5 mg/dL (ref 0.2–1.2)
BUN: 15 mg/dL (ref 6–23)
CO2: 30 meq/L (ref 19–32)
Calcium: 9.1 mg/dL (ref 8.4–10.5)
Chloride: 106 mEq/L (ref 96–112)
Creatinine, Ser: 0.73 mg/dL (ref 0.40–1.50)
GFR: 114.93 mL/min (ref >60.00)
Glucose, Bld: 150 mg/dL — ABNORMAL HIGH (ref 70–99)
Potassium: 4 mEq/L (ref 3.5–5.1)
SODIUM: 143 meq/L (ref 135–145)
TOTAL PROTEIN: 6.6 g/dL (ref 6.0–8.3)

## 2015-08-12 LAB — LIPID PANEL
CHOL/HDL RATIO: 2
Cholesterol: 84 mg/dL (ref 0–200)
HDL: 40.7 mg/dL (ref >39.00)
LDL Cholesterol: 28 mg/dL (ref 0–99)
NonHDL: 43.41
Triglycerides: 76 mg/dL (ref 0.0–149.0)
VLDL: 15.2 mg/dL (ref 0.0–40.0)

## 2015-08-25 ENCOUNTER — Encounter: Payer: Self-pay | Admitting: Endocrinology

## 2015-08-25 ENCOUNTER — Ambulatory Visit (INDEPENDENT_AMBULATORY_CARE_PROVIDER_SITE_OTHER): Payer: Medicare Other | Admitting: Endocrinology

## 2015-08-25 VITALS — BP 140/62 | HR 64 | Temp 98.1°F | Resp 14 | Ht 64.5 in | Wt 216.4 lb

## 2015-08-25 DIAGNOSIS — Z794 Long term (current) use of insulin: Secondary | ICD-10-CM

## 2015-08-25 DIAGNOSIS — Z23 Encounter for immunization: Secondary | ICD-10-CM

## 2015-08-25 DIAGNOSIS — E1165 Type 2 diabetes mellitus with hyperglycemia: Secondary | ICD-10-CM

## 2015-08-25 DIAGNOSIS — I10 Essential (primary) hypertension: Secondary | ICD-10-CM

## 2015-08-25 LAB — POCT GLYCOSYLATED HEMOGLOBIN (HGB A1C): Hemoglobin A1C: 7.7

## 2015-08-25 MED ORDER — DULAGLUTIDE 0.75 MG/0.5ML ~~LOC~~ SOAJ: SUBCUTANEOUS | Status: DC

## 2015-08-25 NOTE — Patient Instructions (Addendum)
Check blood sugars on waking up 3-4.. times a week  Also check blood sugars about 2-3 hours after a meal and do this after different meals by rotation  Recommended blood sugar levels on waking up is 90-130 and about 2 hours after meal is 140-180 Please bring blood sugar monitor to each visit.  Humalog 40-45 at meals based on amount of starch or size of meal May reduce dose if Trulicuity brings sugars <100  Stay on 35 Toujeo unless waking up with high or low sugars  Trulicity 1x per week

## 2015-08-25 NOTE — Progress Notes (Signed)
Patient ID: Samuel [name], male   DOB: 08/09/1951, 64 y.o.   MRN: [id number]            Reason for Appointment:  Followup for Type 2 Diabetes  Referring physician: Saguire  History of Present Illness:          Diagnosis: Type 2 diabetes mellitus, date of diagnosis:  1990      Past history: He thinks he has been on insulin for the last 10-12 years. Previously had been on metformin which has been continued. He was probably tried on different insulin regimens initially but has been taking 70/30 mostly because of cost Previous records are not available and appears that his sugars have been poorly controlled for several years He was  referred here because of an A1c in 8/15 of 9.7%. He was on a regimen of Novolin 70/30 twice a day but because of inadequate control and higher readings later in the day he was switched to basal bolus insulin regimen in 12/15.  Recent history:  History obtained mostly through interpreter  INSULIN regimen is described as:  Humalog 35 units before meal times and 35 units of Toujeo in a.m.  His compliance with instructions for day-to-day management of his diabetes is a little better but still suboptimal Previously A1c has been generally over 8-9% but now it is better at 7.7 He has been instructed by the nurse educator in detail  On his last visit he was wanting to stop his  Bydureon partly because of the large size of the needle and he has not taken it  Current blood sugar patterns and problems identified:  He is again checking his blood sugars mostly before breakfast despite instructions to check readings at various times  Blood sugars are mostly high in the mornings with variability which he thinks is from the type of food he is eating the night before; he does have a couple of readings below 120 but recently they are mostly around 150-190  He has only a couple of readings at suppertime which are about 190 and the only reading at lunchtime is 165  He thinks  he is compliant with taking his insulin with every meal now and has not had any hypoglycemia with this  His fasting blood sugars are higher compared to his last visit       Oral hypoglycemic drugs the patient is taking are:   metformin 1 g twice a day     Side effects from medications have been: ?  Nausea/dizziness on Victoza  Compliance with the medical regimen: Poor  Hypoglycemia:   none   Glucose monitoring:  done once a day         Glucometer:  One Touch ultra 2     Blood Glucose readings by time of day by review of meter download  Mean values apply above for all meters except median for One Touch  PRE-MEAL Fasting Lunch Dinner Bedtime Overall  Glucose range:  108-220    187, 199     Mean/median:  159      163     Self-care: The diet that the patient has been following is: None, not controlling portions and may have high carbohydrate meals especially breakfast    Meals: 3 meals per day. Breakfast is various kinds of bread, milk and eggs.  Dinner at 6 pm    Dietician visit, most recent: 5/16 Last CDE visit: 3/16  Exercise: Walking about 1 mile, 5-7 days a week in the evenings            Weight history: Previous range 200-220  Wt Readings from Last 3 Encounters:  08/25/15 216 lb 6.4 oz (98.158 kg)  08/11/15 219 lb (99.338 kg)  06/23/15 210 lb 3.2 oz (95.346 kg)    Glycemic control:   Lab Results  Component Value Date   HGBA1C 7.7 08/25/2015   HGBA1C 8.1* 04/22/2015   HGBA1C 9.2* 01/13/2015   Lab Results  Component Value Date   MICROALBUR <0.7 04/22/2015   LDLCALC 28 08/12/2015   CREATININE 0.73 08/12/2015         Medication List       This list is accurate as of: 08/25/15 12:58 PM.  Always use your most recent med list.               aspirin 325 MG tablet  Take 81 mg by mouth daily.     atorvastatin 40 MG tablet  Commonly known as:  LIPITOR  TAKE 1 TABLET BY MOUTH DAILY AND ONE-HALF TABLET AT BEDTIME     cholecalciferol 1000 UNITS  tablet  Commonly known as:  VITAMIN D  Take 1,000 Units by mouth daily.     Vitamin D3 1000 UNITS Caps     Dulaglutide 0.75 MG/0.5ML Sopn  Commonly known as:  TRULICITY  Inject weekly     HUMALOG KWIKPEN 100 UNIT/ML KiwkPen  Generic drug:  insulin lispro  ADM 35 UNITS [place] B MEALS     HUMALOG KWIKPEN 100 UNIT/ML KiwkPen  Generic drug:  insulin lispro  ADMINISTER 35 UNITS UNDER THE SKIN BEFORE MEALS     Insulin Glargine 300 UNIT/ML Sopn  Commonly known as:  TOUJEO SOLOSTAR  Inject 35 Units into the skin every morning.     Insulin Pen Needle 31G X 5 MM Misc  Use 5 per day     losartan 50 MG tablet  Commonly known as:  COZAAR  Take 1 tablet (50 mg total) by mouth daily.     metFORMIN 1000 MG tablet  Commonly known as:  GLUCOPHAGE  TAKE 1 TABLET BY MOUTH TWICE DAILY WITH A MEAL     ONE TOUCH ULTRA TEST test strip  Generic drug:  glucose blood  CHECK BLOOD SUGAR THREE TIMES DAILY        Allergies: No Known Allergies  Past Medical History  Diagnosis Date  . Hypertension   . Diabetes mellitus without complication (HCC)   . Blood transfusion without reported diagnosis   . History of ETOH abuse     Past Surgical History  Procedure Laterality Date  . Back surgery N/A 2010    Family History  Problem Relation Age of Onset  . Diabetes    . Hypertension    . Arthritis Father   . Diabetes Father     Social History:  reports that he has quit smoking. He quit smokeless tobacco use about 21 years ago. He reports that he does not drink alcohol or use illicit drugs.    Review of Systems         Lipids:  He has been on Lipitor for hypercholesterolemia since about 2011 good control       Lab Results  Component Value Date   CHOL 84 08/12/2015   HDL 40.70 08/12/2015   LDLCALC 28 08/12/2015   TRIG 76.0 08/12/2015   CHOLHDL 2 08/12/2015       The blood pressure  has been treated with losartan now, prescribed by PCP   Last foot exam was in 07/2014         Physical Examination:  BP 140/62 mmHg  Pulse 64  Temp(Src) 98.1 F (36.7 C)  Resp 14  Ht 5' 4.5" (1.638 m)  Wt 216 lb 6.4 oz (98.158 kg)  BMI 36.58 kg/m2  SpO2 96%     ASSESSMENT:  Diabetes type 2, uncontrolled with obesity and BMI of 36 See history of present illness for detailed discussion of his current management, blood sugar patterns and problems identified Although his A1c is still high at 7.7 this is better than usual for him  He probably has benefited from repeated instructions and explanations on the need to take both mealtime and basal insulin doses consistently.  Also has seen the dietitian earlier this year  Currently is having variable readings in the mornings and not clear why He does not check readings after meals and does not understand the need for postprandial monitoring Also he is starting to gain weight with increasing his insulin doses overall and stopping Bydureon which he did not want to take because of large needle size  He is agreeable to trying Trulicity instead of Bydureon if it is covered by insurance and will start him on 0.75 mg weekly Discussed how to use the pen, possible side effects, benefits and brochure given   PLAN:   He was given printed instructions and reviewed these in detail; given translation in Spanish  Again discussed the need for checking blood sugars consistently at various times per day by rotation insulin just in the morning  Discussed blood sugar targets  *Trulicity weekly with 0.75 and after 1 month go up to 1.5 if no nausea  Discussed need for adjusting his Humalog doses at lunch and supper as he is likely to be having higher readings after those meals  However with starting Trulicity he may be able to see better blood sugars and improve satiety and may need to cut back on Humalog again  No change in Toujeo as yet, discussed possibly adjusting the dose if fasting readings are consistently out of the range  described  Continue regular exercise and try to do daily  Follow-up in 2 months  Counseling time on subjects discussed above is over 50% of today's 25 minute visit   Patient Instructions  Check blood sugars on waking up 3-4.. times a week  Also check blood sugars about 2-3 hours after a meal and do this after different meals by rotation  Recommended blood sugar levels on waking up is 90-130 and about 2 hours after meal is 140-180 Please bring blood sugar monitor to each visit.  Humalog 40-45 at meals based on amount of starch or size of meal May reduce dose if Trulicuity brings sugars <100  Stay on 35 Toujeo unless waking up with high or low sugars  Trulicity 1x per week  Compruebe azcar en la [employer] .[name] 3-4 veces a la semana  Tambin puedes ver los niveles de azcar alrededor de 2-3 horas despus de Neomia Dear comida y hacer esto despus de diferentes comidas por la rotacin     Counseling time on subjects discussed above is over 50% of today's 25 minute visit   Samuel [name] 08/25/2015, 12:58 PM   Note: This office note was prepared with Insurance underwriter. Any transcriptional errors that result from this process are unintentional.

## 2015-09-22 ENCOUNTER — Other Ambulatory Visit: Payer: Self-pay | Admitting: *Deleted

## 2015-09-22 ENCOUNTER — Telehealth: Payer: Self-pay | Admitting: Endocrinology

## 2015-09-22 MED ORDER — INSULIN LISPRO 100 UNIT/ML (KWIKPEN): PEN_INJECTOR | SUBCUTANEOUS | Status: DC

## 2015-09-22 NOTE — Telephone Encounter (Signed)
Patient need a refill of Humalog  [id number] - [place],  - [address] [facility] [address] [phone] (Phone) [phone] (Fax)

## 2015-10-27 ENCOUNTER — Ambulatory Visit: Payer: Medicare Other | Admitting: Endocrinology

## 2015-10-27 ENCOUNTER — Telehealth: Payer: Self-pay | Admitting: Endocrinology

## 2015-10-27 NOTE — Telephone Encounter (Signed)
Patient no showed today's appt. Please advise on how to follow up. °A. No follow up necessary. °B. Follow up urgent. Contact patient immediately. °C. Follow up necessary. Contact patient and schedule visit in ___ days. °D. Follow up advised. Contact patient and schedule visit in ____weeks. ° °

## 2015-10-28 ENCOUNTER — Encounter: Payer: Self-pay | Admitting: *Deleted

## 2015-10-28 NOTE — Telephone Encounter (Signed)
Letter mailed

## 2015-11-05 ENCOUNTER — Other Ambulatory Visit: Payer: Self-pay | Admitting: *Deleted

## 2015-11-05 ENCOUNTER — Ambulatory Visit (INDEPENDENT_AMBULATORY_CARE_PROVIDER_SITE_OTHER): Payer: Medicare Other | Admitting: Endocrinology

## 2015-11-05 DIAGNOSIS — E1165 Type 2 diabetes mellitus with hyperglycemia: Secondary | ICD-10-CM | POA: Diagnosis not present

## 2015-11-05 DIAGNOSIS — Z794 Long term (current) use of insulin: Secondary | ICD-10-CM

## 2015-11-05 LAB — POCT GLYCOSYLATED HEMOGLOBIN (HGB A1C): HEMOGLOBIN A1C: 7.7

## 2015-11-05 MED ORDER — DULAGLUTIDE 0.75 MG/0.5ML ~~LOC~~ SOAJ: SUBCUTANEOUS | Status: DC

## 2015-11-05 NOTE — Progress Notes (Signed)
Patient ID: Samuel [name], male   DOB: 08/01/1951, 64 y.o.   MRN: [id number]            Reason for Appointment:  Followup for Type 2 Diabetes  Referring physician: Saguire  History of Present Illness:          Diagnosis: Type 2 diabetes mellitus, date of diagnosis:  1990      Past history: He thinks he has been on insulin for the last 10-12 years. Previously had been on metformin which has been continued. He was probably tried on different insulin regimens initially but has been taking 70/30 mostly because of cost Previous records are not available and appears that his sugars have been poorly controlled for several years He was  referred here because of an A1c in 8/15 of 9.7%. He was on a regimen of Novolin 70/30 twice a day but because of inadequate control and higher readings later in the day he was switched to basal bolus insulin regimen in 12/15.  Recent history:  History obtained mostly through interpreter  INSULIN regimen is described as:  Humalog 35 units before meal times and 35 units of Toujeo in a.m.  His compliance with instructions for day-to-day management of his diabetes is a little better but still suboptimal Previously A1c has been generally over 8-9% but now it is stable at 7.7 Because of overall difficulty controlling blood sugars and not losing weight he was told to start Trulicity on his last visit Initially was somewhat confused about this but later he admitted that he did take it for a month but did not keep his follow-up appointment to assess his progress   Current blood sugar patterns and problems identified:  He is again checking his blood sugars mostly before breakfast despite instructions to check readings at various times  Blood sugars are mostly high in the mornings with variability which he thinks is from the type of food he is eating the night before; he does have a couple of readings below 120 but recently they are mostly around 150-190  He has only a  couple of readings at suppertime which are about 190 and the only reading at lunchtime is 165  He thinks he is compliant with taking his insulin with every meal now and has not had any hypoglycemia with this  His fasting blood sugars are higher compared to his last visit  He will sometimes miss his insulin at mealtimes especially lunch when he is not home also asking about whether he can take insulin after eating if he forgets to take it before eating       Oral hypoglycemic drugs the patient is taking are:   metformin 1 g twice a day     Side effects from medications have been: ?  Nausea/dizziness on Victoza  Compliance with the medical regimen: Poor  Hypoglycemia:   none   Glucose monitoring:  done once a day         Glucometer:  One Touch ultra 2     Blood Glucose readings by time of day by review of meter download  Mean values apply above for all meters except median for One Touch  PRE-MEAL Fasting Lunch Dinner Bedtime Overall  Glucose range:  108-235   103-277   87   57-100    Mean/median: 183     175    POST-MEAL PC Breakfast PC Lunch PC Dinner  Glucose range:  111, 250   144, 200   110-258  Mean/median:       Self-care: The diet that the patient has been following is: None, not controlling portions and may have high carbohydrate meals especially breakfast    Meals: 3 meals per day. Breakfast is various kinds of bread, milk and eggs.  Dinner at 6 pm    Dietician visit, most recent: 5/16 Last CDE visit: 3/16                 Exercise: Walking about 1 mile, 5-7 days a week in the evenings            Weight history: Previous range 200-220  Wt Readings from Last 3 Encounters:  11/05/15 214 lb 3.2 oz (97.16 kg)  08/25/15 216 lb 6.4 oz (98.158 kg)  08/11/15 219 lb (99.338 kg)    Glycemic control:   Lab Results  Component Value Date   HGBA1C 7.7 11/05/2015   HGBA1C 7.7 08/25/2015   HGBA1C 8.1* 04/22/2015   Lab Results  Component Value Date   MICROALBUR <0.7  04/22/2015   LDLCALC 28 08/12/2015   CREATININE 0.73 08/12/2015         Medication List       This list is accurate as of: 11/05/15  1:38 PM.  Always use your most recent med list.               aspirin 325 MG tablet  Take 81 mg by mouth daily.     atorvastatin 40 MG tablet  Commonly known as:  LIPITOR  TAKE 1 TABLET BY MOUTH DAILY AND ONE-HALF TABLET AT BEDTIME     cholecalciferol 1000 units tablet  Commonly known as:  VITAMIN D  Take 1,000 Units by mouth daily. Reported on 11/05/2015     Vitamin D3 1000 units Caps  Reported on 11/05/2015     Dulaglutide 0.75 MG/0.5ML Sopn  Commonly known as:  TRULICITY  Inject weekly     Insulin Glargine 300 UNIT/ML Sopn  Commonly known as:  TOUJEO SOLOSTAR  Inject 35 Units into the skin every morning.     insulin lispro 100 UNIT/ML KiwkPen  Commonly known as:  HUMALOG KWIKPEN  ADMINISTER 35 UNITS UNDER THE SKIN BEFORE MEALS     Insulin Pen Needle 31G X 5 MM Misc  Use 5 per day     losartan 50 MG tablet  Commonly known as:  COZAAR  Take 1 tablet (50 mg total) by mouth daily.     metFORMIN 1000 MG tablet  Commonly known as:  GLUCOPHAGE  TAKE 1 TABLET BY MOUTH TWICE DAILY WITH A MEAL     ONE TOUCH ULTRA TEST test strip  Generic drug:  glucose blood  CHECK BLOOD SUGAR THREE TIMES DAILY        Allergies: No Known Allergies  Past Medical History  Diagnosis Date  . Hypertension   . Diabetes mellitus without complication (HCC)   . Blood transfusion without reported diagnosis   . History of ETOH abuse     Past Surgical History  Procedure Laterality Date  . Back surgery N/A 2010    Family History  Problem Relation Age of Onset  . Diabetes    . Hypertension    . Arthritis Father   . Diabetes Father     Social History:  reports that he has quit smoking. He quit smokeless tobacco use about 22 years ago. He reports that he does not drink alcohol or use illicit drugs.    Review of Systems  Lipids:   He has been on Lipitor for hypercholesterolemia since about 2011 good control       Lab Results  Component Value Date   CHOL 84 08/12/2015   HDL 40.70 08/12/2015   LDLCALC 28 08/12/2015   TRIG 76.0 08/12/2015   CHOLHDL 2 08/12/2015       The blood pressure has been treated with losartan, prescribed by PCP   Last foot exam was in 07/2014        Physical Examination:  BP 152/90 mmHg  Pulse 75  Temp(Src) 98.5 F (36.9 C) (Oral)  Resp 14  Ht 5' 4.5" (1.638 m)  Wt 214 lb 3.2 oz (97.16 kg)  BMI 36.21 kg/m2  SpO2 98%     ASSESSMENT:  Diabetes type 2, uncontrolled with obesity and BMI of 36 See history of present illness for detailed discussion of his current management, blood sugar patterns and problems identified Although his A1c is still high at 7.7 his highest blood sugars appear to be mostly fasting Blood sugars tend to be lower in the evenings and not clear if this is related to inadequate duration of action of Toujeo  He did appear to benefit from taking Trulicity by history but do not have his blood sugar readings from that month and he would like to go back on it. Difficult to assess his mealtime insulin need since he usually does not monitor readings after meals and does not mark his prandial blood sugars He is agreeable to trying Trulicity 1.5 mg weekly now   PLAN:   He was given printed instructions and reviewed these in detail with him and his daughter  Again discussed the need for checking blood sugars consistently after meals and marking them as such  Trial of Toujeo in the evenings instead of morning's  If blood sugars start coming down significantly after starting Trulicity will need to taper down his insulin  May probably need only 30 units at suppertime especially if he is reducing his portions at that time  May take insulin right after eating a forgetting to take before eating  Take insulin to the restaurant when eating out  Follow-up in 2  months  Counseling time on subjects discussed above is over 50% of today's 25 minute visit   Patient Instructions  Check blood sugars on waking up   times a week Also check blood sugars about 2 hours after a meal and do this after different meals by rotation  Recommended blood sugar levels on waking up is 90-130 and about 2 hours after meal is 130-160  Please bring your blood sugar monitor to each visit, thank you  Take Toujeo 35 units at dinner not in am, if am sugar stays<90 reduce dose to 30  If eating less starch or small meals reduce humalog to 30  Trulicity 1x per week,1.5 mg   Counseling time on subjects discussed above is over 50% of today's 25 minute visit   Samuel [name] 11/05/2015, 1:38 PM   Note: This office note was prepared with Dragon voice recognition system technology. Any transcriptional errors that result from this process are unintentional.

## 2015-11-05 NOTE — Patient Instructions (Addendum)
Check blood sugars on waking up   times a week Also check blood sugars about 2 hours after a meal and do this after different meals by rotation  Recommended blood sugar levels on waking up is 90-130 and about 2 hours after meal is 130-160  Please bring your blood sugar monitor to each visit, thank you  Take Toujeo 35 units at dinner not in am, if am sugar stays<90 reduce dose to 30  If eating less starch or small meals reduce humalog to 30  Trulicity 1x per week,1.5 mg

## 2015-11-11 ENCOUNTER — Ambulatory Visit: Payer: Medicare Other | Admitting: Medical

## 2015-11-14 ENCOUNTER — Encounter: Payer: Self-pay | Admitting: Medical

## 2015-11-14 ENCOUNTER — Ambulatory Visit (INDEPENDENT_AMBULATORY_CARE_PROVIDER_SITE_OTHER): Payer: Medicare Other | Admitting: Medical

## 2015-11-14 VITALS — BP 130/80 | HR 81 | Temp 98.1°F | Ht 65.0 in | Wt 215.6 lb

## 2015-11-14 DIAGNOSIS — E785 Hyperlipidemia, unspecified: Secondary | ICD-10-CM

## 2015-11-14 DIAGNOSIS — L989 Disorder of the skin and subcutaneous tissue, unspecified: Secondary | ICD-10-CM

## 2015-11-14 DIAGNOSIS — Z794 Long term (current) use of insulin: Secondary | ICD-10-CM

## 2015-11-14 DIAGNOSIS — I10 Essential (primary) hypertension: Secondary | ICD-10-CM

## 2015-11-14 DIAGNOSIS — E119 Type 2 diabetes mellitus without complications: Secondary | ICD-10-CM

## 2015-11-14 NOTE — Progress Notes (Signed)
Subjective:    Patient ID: Samuel [name], male    DOB: 04-09-1951, 65 y.o.   MRN: [id number]  HPI  Pt in with some recent cold past week but now better. Only faint residual  congestion. Rare cough now. Feels like over illness. No fevers or chills.  Pt has seen the endocrinologist. A1-c still little high. A1-c 7.7. Was higher in the past.  Pt lipids were ok in October. Not fasting today.  Pt bp is good today. No cardiac signs or symptoms. No neurologic signs or symptoms. Compliant on bp med. No side effects  Pt mentions at end of interview that he has small area of skin that bleeds from tip of the nose. Area will bleed intermittent. On and off occurrence for one year. Sometimes takes while to stop bleeding. He states not from his nares but from surface of skin on his nose.     Review of Systems  Constitutional: Negative for fever, chills and fatigue.  Respiratory: Negative for cough, chest tightness, shortness of breath and wheezing.   Cardiovascular: Negative for chest pain and palpitations.  Endocrine: Negative for polydipsia, polyphagia and polyuria.  Musculoskeletal: Negative for back pain.  Skin:       See hpi.  Neurological: Negative for dizziness, facial asymmetry, speech difficulty and numbness.  Hematological: Negative for adenopathy. Does not bruise/bleed easily.  Psychiatric/Behavioral: Negative for behavioral problems and confusion.   Past Medical History  Diagnosis Date  . Hypertension   . Diabetes mellitus without complication (HCC)   . Blood transfusion without reported diagnosis   . History of ETOH abuse     Social History   Social History  . Marital Status: Married    Spouse Name: N/A  . Number of Children: N/A  . Years of Education: N/A   Occupational History  . Not on file.   Social History Main Topics  . Smoking status: Former Games developer  . Smokeless tobacco: Former Neurosurgeon    Quit date: 11/08/1993  . Alcohol Use: No  . Drug Use: No  . Sexual  Activity: Not on file   Other Topics Concern  . Not on file   Social History Narrative    Past Surgical History  Procedure Laterality Date  . Back surgery N/A 2010    Family History  Problem Relation Age of Onset  . Diabetes    . Hypertension    . Arthritis Father   . Diabetes Father     No Known Allergies  Current Outpatient Prescriptions on File Prior to Visit  Medication Sig Dispense Refill  . aspirin 325 MG tablet Take 81 mg by mouth daily.     [name] atorvastatin (LIPITOR) 40 MG tablet TAKE 1 TABLET BY MOUTH DAILY AND ONE-HALF TABLET AT BEDTIME 45 tablet 3  . cholecalciferol (VITAMIN D) 1000 UNITS tablet Take 1,000 Units by mouth daily. Reported on 11/05/2015    . Cholecalciferol (VITAMIN D3) 1000 UNITS CAPS Reported on 11/05/2015  6  . Dulaglutide (TRULICITY) 0.75 MG/0.5ML SOPN Inject weekly 4 pen 3  . Insulin Glargine (TOUJEO SOLOSTAR) 300 UNIT/ML SOPN Inject 35 Units into the skin every morning. 3 pen 3  . insulin lispro (HUMALOG KWIKPEN) 100 UNIT/ML KiwkPen ADMINISTER 35 UNITS UNDER THE SKIN BEFORE MEALS 100 mL 1  . Insulin Pen Needle 31G X 5 MM MISC Use 5 per day 150 each 3  . losartan (COZAAR) 50 MG tablet Take 1 tablet (50 mg total) by mouth daily. 90 tablet 3  . metFORMIN (  GLUCOPHAGE) 1000 MG tablet TAKE 1 TABLET BY MOUTH TWICE DAILY WITH A MEAL 180 tablet 2  . ONE TOUCH ULTRA TEST test strip CHECK BLOOD SUGAR THREE TIMES DAILY 300 each 1   No current facility-administered medications on file prior to visit.    BP 130/80 mmHg  Pulse 81  Temp(Src) 98.1 F (36.7 C) (Oral)  Ht 5\' 5"  (1.651 m)  Wt 215 lb 9.6 oz (97.796 kg)  BMI 35.88 kg/m2  SpO2 97%       Objective:   Physical Exam  General Mental Status- Alert. General Appearance- Not in acute distress.   Skin General: Color- Normal Color. Moisture- Normal Moisture. Lt side of nose very faint discoloration to skin.(over area that he states bleeds)  Neck Carotid Arteries- Normal color. Moisture-  Normal Moisture No JVD.  Chest and Lung Exam Auscultation: Breath Sounds:-Normal.  Cardiovascular Auscultation:Rythm- Regular. Murmurs & Other Heart Sounds:Auscultation of the heart reveals- No Murmurs.  Abdomen Inspection:-Inspeection Normal. Palpation/Percussion:Note:No mass. Palpation and Percussion of the abdomen reveal- Non Tender, Non Distended + BS, no rebound or guarding.    Neurologic Cranial Nerve exam:- CN III-XII intact(No nystagmus), symmetric smile. Strength:- 5/5 equal and symmetric strength both upper and lower extremities.      Assessment & Plan:  BP is well controlled losartan.  Lipids well controlled in past. Recheck next week fasting.  Diabetes improving slowly. Continue with endocrinologist.  For skin lesion that is faint and occasionally bleeds will refer to dermatologist.  Follow up in 3 months or as needed

## 2015-11-14 NOTE — Progress Notes (Signed)
Pre visit review using our clinic review tool, if applicable. No additional management support is needed unless otherwise documented below in the visit note. 

## 2015-11-14 NOTE — Patient Instructions (Addendum)
BP is well controlled losartan.  Lipids well controlled in past. Recheck next week fasting.  Diabetes improving slowly. Continue with endocrinologist.  For skin lesion that is faint and occasionally bleeds will refer to dermatologist.  Follow up in 3 months or as needed

## 2015-11-17 ENCOUNTER — Other Ambulatory Visit: Payer: Medicare Other

## 2015-11-19 ENCOUNTER — Other Ambulatory Visit (INDEPENDENT_AMBULATORY_CARE_PROVIDER_SITE_OTHER): Payer: Medicare Other

## 2015-11-19 DIAGNOSIS — E785 Hyperlipidemia, unspecified: Secondary | ICD-10-CM

## 2015-11-19 LAB — LIPID PANEL
CHOLESTEROL: 83 mg/dL (ref 0–200)
HDL: 36.1 mg/dL — ABNORMAL LOW (ref >39.00)
LDL CALC: 29 mg/dL (ref 0–99)
NonHDL: 46.78
TRIGLYCERIDES: 88 mg/dL (ref 0.0–149.0)
Total CHOL/HDL Ratio: 2
VLDL: 17.6 mg/dL (ref 0.0–40.0)

## 2015-11-19 LAB — COMPREHENSIVE METABOLIC PANEL
ALBUMIN: 4.3 g/dL (ref 3.5–5.2)
ALK PHOS: 49 U/L (ref 39–117)
ALT: 21 U/L (ref 0–53)
AST: 19 U/L (ref 0–37)
BUN: 22 mg/dL (ref 6–23)
CALCIUM: 9.5 mg/dL (ref 8.4–10.5)
CHLORIDE: 104 meq/L (ref 96–112)
CO2: 30 mEq/L (ref 19–32)
CREATININE: 0.73 mg/dL (ref 0.40–1.50)
GFR: 114.83 mL/min (ref >60.00)
Glucose, Bld: 171 mg/dL — ABNORMAL HIGH (ref 70–99)
POTASSIUM: 4.1 meq/L (ref 3.5–5.1)
Sodium: 140 mEq/L (ref 135–145)
Total Bilirubin: 0.6 mg/dL (ref 0.2–1.2)
Total Protein: 6.7 g/dL (ref 6.0–8.3)

## 2015-12-14 ENCOUNTER — Other Ambulatory Visit: Payer: Self-pay | Admitting: Medical

## 2016-01-06 ENCOUNTER — Ambulatory Visit: Payer: Medicare Other | Admitting: Endocrinology

## 2016-01-12 ENCOUNTER — Telehealth: Payer: Self-pay | Admitting: Endocrinology

## 2016-01-12 ENCOUNTER — Ambulatory Visit: Payer: Medicare Other | Admitting: Endocrinology

## 2016-01-12 ENCOUNTER — Encounter: Payer: Self-pay | Admitting: *Deleted

## 2016-01-12 NOTE — Telephone Encounter (Signed)
Reschedule follow-up ASAP

## 2016-01-12 NOTE — Telephone Encounter (Signed)
Patient no showed today's appt. Please advise on how to follow up. °A. No follow up necessary. °B. Follow up urgent. Contact patient immediately. °C. Follow up necessary. Contact patient and schedule visit in ___ days. °D. Follow up advised. Contact patient and schedule visit in ____weeks. ° °

## 2016-01-13 NOTE — Telephone Encounter (Signed)
Letter mailed

## 2016-01-17 ENCOUNTER — Ambulatory Visit (INDEPENDENT_AMBULATORY_CARE_PROVIDER_SITE_OTHER): Payer: Medicare Other | Admitting: Family Medicine

## 2016-01-17 ENCOUNTER — Encounter: Payer: Self-pay | Admitting: Family Medicine

## 2016-01-17 VITALS — BP 128/60 | HR 76 | Temp 98.7°F | Wt 215.0 lb

## 2016-01-17 DIAGNOSIS — R509 Fever, unspecified: Secondary | ICD-10-CM

## 2016-01-17 DIAGNOSIS — J069 Acute upper respiratory infection, unspecified: Secondary | ICD-10-CM

## 2016-01-17 DIAGNOSIS — J4521 Mild intermittent asthma with (acute) exacerbation: Secondary | ICD-10-CM | POA: Diagnosis not present

## 2016-01-17 LAB — POCT INFLUENZA A/B
Influenza A, POC: NEGATIVE
Influenza B, POC: NEGATIVE

## 2016-01-17 MED ORDER — ALBUTEROL SULFATE HFA 108 (90 BASE) MCG/ACT IN AERS: 2.0000 | INHALATION_SPRAY | Freq: Four times a day (QID) | RESPIRATORY_TRACT | Status: DC | PRN

## 2016-01-17 MED ORDER — AZITHROMYCIN 250 MG PO TABS: ORAL_TABLET | ORAL | Status: DC

## 2016-01-17 NOTE — Progress Notes (Signed)
[facility] [address] [place], [place] [address] Phone: [phone]  Patient ID: Samuel [name] MRN: [id number], DOB: [birth year], 65 y.o. Date of Encounter: 01/17/2016  Primary Physician:  Esperanza Richters, PA-C   Chief Complaint:  Chief Complaint  Patient presents with  . coughing/fever/body aching   Subjective:   History of Present Illness:  Samuel [name] is a 65 y.o. pleasant patient who presents with the following:  Daughter is assisting with translating.  Has been sick for a few days, coughing a lot, a lot of congestion in the chest and runny nose. Has had some fever.   Runny nose for more than a month - and a week or so has had a lot of coughing.   Does have a history of asthma and has used inhalers in the past. He thinks this has really been affected for about 2 weeks. Out of his inhaler right now.  The PMH, PSH, Social History, Family History, Medications, and allergies have been reviewed in [facility], and have been updated if relevant.  Review of Systems:  GEN: No acute illnesses, no fevers, chills. GI: No n/v/d, eating normally Pulm: coughing and mildly sob Interactive and getting along well at home.  Otherwise, ROS is as per the HPI.  Objective:   Physical Examination: BP 128/60 mmHg  Pulse 76  Temp(Src) 98.7 F (37.1 C)  Wt 215 lb (97.523 kg)   GEN: WDWN, NAD, Non-toxic, A & O x 3 HEENT: Atraumatic, Normocephalic. Neck supple. No masses, No LAD. Ears and Nose: No external deformity. CV: RRR, No M/G/R. No JVD. No thrill. No extra heart sounds. PULM: CTA B, no wheezes, crackles, rhonchi. No retractions. No resp. distress. No accessory muscle use. EXTR: No c/c/e NEURO Normal gait.  PSYCH: Normally interactive. Conversant. Not depressed or anxious appearing.  Calm demeanor.   Laboratory and Imaging Data: Results for orders placed or performed in visit on 01/17/16  POCT Influenza A/B  Result Value Ref  Range   Influenza A, POC Negative Negative   Influenza B, POC Negative Negative     Assessment & Plan:   Asthma with acute exacerbation, mild intermittent  Fever, unspecified - Plan: POCT Influenza A/B  URI (upper respiratory infection)  Asthma exac - more likely with viral syndrome also   Start with albuterol Add azithro  D/w daughter if he is not better in a few days steroids may be needed, but i would prefer to avoid in a DM patient on insulin.  Follow-up: No Follow-up on file.  New Prescriptions   ALBUTEROL (PROVENTIL HFA;VENTOLIN HFA) 108 (90 BASE) MCG/ACT INHALER    Inhale 2 puffs into the lungs every 6 (six) hours as needed for wheezing or shortness of breath.   AZITHROMYCIN (ZITHROMAX) 250 MG TABLET    2 tabs po on day 1, then 1 tab po for 4 days   Modified Medications   No medications on file   Orders Placed This Encounter  Procedures  . POCT Influenza A/B    Signed,  Zooey Schreurs T. Kia Stavros, MD   Patient's Medications  New Prescriptions   ALBUTEROL (PROVENTIL HFA;VENTOLIN HFA) 108 (90 BASE) MCG/ACT INHALER    Inhale 2 puffs into the lungs every 6 (six) hours as needed for wheezing or shortness of breath.   AZITHROMYCIN (ZITHROMAX) 250 MG TABLET    2 tabs po on day 1, then 1 tab po for 4 days  Previous Medications   ASPIRIN 325 MG TABLET  Take 81 mg by mouth daily.    ATORVASTATIN (LIPITOR) 40 MG TABLET    TAKE 1 TABLET BY MOUTH DAILY AND ONE-HALF TABLET AT BEDTIME   CHOLECALCIFEROL (VITAMIN D) 1000 UNITS TABLET    Take 1,000 Units by mouth daily. Reported on 11/05/2015   CHOLECALCIFEROL (VITAMIN D3) 1000 UNITS CAPS    Reported on 11/05/2015   DULAGLUTIDE (TRULICITY) 0.75 MG/0.5ML SOPN    Inject weekly   INSULIN GLARGINE (TOUJEO SOLOSTAR) 300 UNIT/ML SOPN    Inject 35 Units into the skin every morning.   INSULIN LISPRO (HUMALOG KWIKPEN) 100 UNIT/ML KIWKPEN    ADMINISTER 35 UNITS UNDER THE SKIN BEFORE MEALS   INSULIN PEN NEEDLE 31G X 5 MM MISC    Use 5 per day     LOSARTAN (COZAAR) 50 MG TABLET    TAKE 1 TABLET(50 MG) BY MOUTH DAILY   METFORMIN (GLUCOPHAGE) 1000 MG TABLET    TAKE 1 TABLET BY MOUTH TWICE DAILY WITH A MEAL   ONE TOUCH ULTRA TEST TEST STRIP    CHECK BLOOD SUGAR THREE TIMES DAILY  Modified Medications   No medications on file  Discontinued Medications   No medications on file

## 2016-01-26 ENCOUNTER — Encounter: Payer: Self-pay | Admitting: Endocrinology

## 2016-01-26 ENCOUNTER — Ambulatory Visit (INDEPENDENT_AMBULATORY_CARE_PROVIDER_SITE_OTHER): Payer: Medicare Other | Admitting: Endocrinology

## 2016-01-26 VITALS — BP 130/66 | HR 67 | Temp 98.8°F | Resp 14 | Ht 65.0 in | Wt 214.2 lb

## 2016-01-26 DIAGNOSIS — E119 Type 2 diabetes mellitus without complications: Secondary | ICD-10-CM | POA: Diagnosis not present

## 2016-01-26 DIAGNOSIS — Z794 Long term (current) use of insulin: Secondary | ICD-10-CM

## 2016-01-26 DIAGNOSIS — E1165 Type 2 diabetes mellitus with hyperglycemia: Secondary | ICD-10-CM

## 2016-01-26 LAB — POCT GLYCOSYLATED HEMOGLOBIN (HGB A1C): Hemoglobin A1C: 7.7

## 2016-01-26 NOTE — Progress Notes (Signed)
Patient ID: Samuel [name], male   DOB: September 04, 1951, 65 y.o.   MRN: [id number]            Reason for Appointment:  Followup for Type 2 Diabetes  Referring physician: [place]  History of Present Illness:          Diagnosis: Type 2 diabetes mellitus, date of diagnosis:  1990      Past history: He thinks he has been on insulin for the last 10-12 years. Previously had been on metformin which has been continued. He was probably tried on different insulin regimens initially but has been taking 70/30 mostly because of cost Previous records are not available and appears that his sugars have been poorly controlled for several years He was  referred here because of an A1c in 8/15 of 9.7%. He was on a regimen of Novolin 70/30 twice a day but because of inadequate control and higher readings later in the day he was switched to basal bolus insulin regimen in 12/15.  Recent history:  History obtained through interpreter  INSULIN regimen is described as:  Humalog 35 units before meal times and 35 units of Toujeo in a.m.  His compliance with instructions for day-to-day management of his diabetes is still poor and he has not made any changes that were recommended on his last visit He has however started taking Trulicity Previously A1c has been generally over 8-9% but now it is stable at 7.7    Again discussed the need for checking blood sugars consistently after meals and marking them as such  Trial of Toujeo in the evenings instead of morning's  If blood sugars start coming down significantly after starting Trulicity will need to taper down his insulin  May probably need only 30 units at suppertime especially if he is reducing his portions at that time  May take insulin right after eating a forgetting to take before eating  Take insulin to the restaurant when eating out  Current management, blood sugar patterns and problems identified:  He is again checking his blood sugars mostly before  breakfast recently  He was told to take Toujeo in the evenings to help morning sugar control but he is still doing it in the mornings only  He has had occasional hypoglycemia after supper or overnight; he now says that sometimes he will take his evening Humalog after eating around 10 PM, on his last visit he was advised to take his insulin with him and inject before eating  FASTING blood sugars are variable but mostly high and also he does not know why his readings were consistently high in the mornings in the later part of February and early March  Blood sugars are sporadically high mid-day which he cannot explain and also has sporadic high readings after supper  He thinks he is compliant with his mealtime insulin at breakfast and lunch  He now says that he is sometimes eating a lighter meal in the evening but does not reduce his suppertime and evening coverage       Oral hypoglycemic drugs the patient is taking are:   metformin 1 g twice a day     Side effects from medications have been: ?  Nausea/dizziness on Victoza  Compliance with the medical regimen: Poor  Hypoglycemia:   once after supper and once at 2 AM   Glucose monitoring:  done mostly once a day         Glucometer:  One Touch ultra 2  Blood Glucose readings by time of day by review of meter download  Mean values apply above for all meters except median for One Touch  PRE-MEAL Fasting Lunch Dinner Bedtime Overall  Glucose range: 120-276  125-289  73, 123  56-286    Mean/median:     164   Self-care: The diet that the patient has been following is: None, not Always controlling portions and may have high carbohydrate meals especially breakfast    Meals: 3 meals per day. Breakfast at 8-9 am is various kinds of bread, milk and eggs.  Dinner at 6 pm    Dietician visit, most recent: 5/16 Last CDE visit: 3/16                 Exercise: Walking about 1 mile, less recently because of various factors and interpreter present  illness  Weight history: Previous range 200-220  Wt Readings from Last 3 Encounters:  01/26/16 214 lb 3.2 oz (97.16 kg)  01/17/16 215 lb (97.523 kg)  11/14/15 215 lb 9.6 oz (97.796 kg)    Glycemic control:   Lab Results  Component Value Date   HGBA1C 7.7 01/26/2016   HGBA1C 7.7 11/05/2015   HGBA1C 7.7 08/25/2015   Lab Results  Component Value Date   MICROALBUR <0.7 04/22/2015   LDLCALC 29 11/19/2015   CREATININE 0.73 11/19/2015         Medication List       This list is accurate as of: 01/26/16  9:39 PM.  Always use your most recent med list.               albuterol 108 (90 Base) MCG/ACT inhaler  Commonly known as:  PROVENTIL HFA;VENTOLIN HFA  Inhale 2 puffs into the lungs every 6 (six) hours as needed for wheezing or shortness of breath.     aspirin 325 MG tablet  Take 81 mg by mouth daily.     atorvastatin 40 MG tablet  Commonly known as:  LIPITOR  TAKE 1 TABLET BY MOUTH DAILY AND ONE-HALF TABLET AT BEDTIME     azithromycin 250 MG tablet  Commonly known as:  ZITHROMAX  2 tabs po on day 1, then 1 tab po for 4 days     cholecalciferol 1000 units tablet  Commonly known as:  VITAMIN D  Take 1,000 Units by mouth daily. Reported on 11/05/2015     Vitamin D3 1000 units Caps  Reported on 11/05/2015     Dulaglutide 0.75 MG/0.5ML Sopn  Commonly known as:  TRULICITY  Inject weekly     Insulin Glargine 300 UNIT/ML Sopn  Commonly known as:  TOUJEO SOLOSTAR  Inject 35 Units into the skin every morning.     insulin lispro 100 UNIT/ML KiwkPen  Commonly known as:  HUMALOG KWIKPEN  ADMINISTER 35 UNITS UNDER THE SKIN BEFORE MEALS     Insulin Pen Needle 31G X 5 MM Misc  Use 5 per day     losartan 50 MG tablet  Commonly known as:  COZAAR  TAKE 1 TABLET(50 MG) BY MOUTH DAILY     metFORMIN 1000 MG tablet  Commonly known as:  GLUCOPHAGE  TAKE 1 TABLET BY MOUTH TWICE DAILY WITH A MEAL     ONE TOUCH ULTRA TEST test strip  Generic drug:  glucose blood  CHECK  BLOOD SUGAR THREE TIMES DAILY        Allergies: No Known Allergies  Past Medical History  Diagnosis Date  . Hypertension   . Diabetes  mellitus without complication (HCC)   . Blood transfusion without reported diagnosis   . History of ETOH abuse     Past Surgical History  Procedure Laterality Date  . Back surgery N/A 2010    Family History  Problem Relation Age of Onset  . Diabetes    . Hypertension    . Arthritis Father   . Diabetes Father     Social History:  reports that he has quit smoking. He quit smokeless tobacco use about 22 years ago. He reports that he does not drink alcohol or use illicit drugs.    Review of Systems         Lipids:  He has been on Lipitor for hypercholesterolemia since about 2011 good control       Lab Results  Component Value Date   CHOL 83 11/19/2015   HDL 36.10* 11/19/2015   LDLCALC 29 11/19/2015   TRIG 88.0 11/19/2015   CHOLHDL 2 11/19/2015       The blood pressure has been treated with losartan, prescribed by PCP   Last foot exam was in 1/17       Physical Examination:  BP 130/66 mmHg  Pulse 67  Temp(Src) 98.8 F (37.1 C)  Resp 14  Ht  (1.651 m)  Wt 214 lb 3.2 oz (97.16 kg)  BMI 35.64 kg/m2  SpO2 97%     ASSESSMENT:  Diabetes type 2, uncontrolled with obesity and BMI of 36 See history of present illness for detailed discussion of his current management, blood sugar patterns and problems identified His blood sugars have been inconsistent over the last month because of intercurrent illness Also has variable compliance with taking mealtime insulin when eating out and also metformin  Difficult to get his compliance with all the measures needed and changes recommended as he either does not understand or does not remember what to do when he goes home He did not make any changes in his regimen as discussed on his last visit He also does not understand the need to take metformin consistently and he may skip it  occasionally for fear of hypoglycemia Some of his hyperglycemia is related to eating out and not taking insulin with him This may also be causing some hypoglycemia overnight  Because of his respiratory illness the also has not exercised Not clear if he is benefiting yet from Trulicity but his weight is about the same   PLAN:   He was given printed instructions in Spanish and reviewed these in detail with him   Try taking Toujeo in the evening and discussed adjusting the dose if his fasting readings are relatively lower  Consistent mealtime insulin but need to reduce suppertime dose by 5 units if eating smaller meals  Take insulin with him when he is eating out  Stop checking blood sugar every morning and do at least one other reading after a meal or bedtime  Restart regular exercise and walking  Take metformin consistently twice a day  Counseling time on subjects discussed above is over 50% of today's 25 minute visit   Patient Instructions  Metformin take daily 2x all the time  Check blood sugars on waking up 3 times a week   Also check blood sugars about 2 hours after a meal and do this after different meals by rotation  Recommended blood sugar levels on waking up is 90-130 and about 2 hours after meal is 130-160  Please bring your blood sugar monitor to each visit, thank  you   Take Toujeo 35 units at dinner not in am, if am sugar stays<90 reduce dose to 30   If eating less starch or small meals especially at supper reduce humalog to 30  Take humalog with you when eating out  La metformina toma diariamente 2x todo el tiempo  Compruebe los niveles de azcar en la sangre al despertarse 3 veces por semana  Tambin compruebe los azcares de la sangre aproximadamente 2 horas despus de una comida y haga esto despus de diversas comidas por la rotacin Los niveles recomendados de azcar en la sangre al despertar es de 90-130 y aproximadamente 2 horas despus de la comida es  de 130-160 Por favor, traiga su monitor de Banker a cada visita, gracias  Tome Toujeo 35 unidades en la cena no en la maana, si el azcar se mantiene <90 reducir la dosis a 30  Si comer menos almidn o comidas pequeas especialmente en la cena reducir humalog a 30  Tome humalog con usted cuando comer fuera    Counseling time on subjects discussed above is over 50% of today's 25 minute visit   Janiyha Montufar 01/26/2016, 9:39 PM   Note: This office note was prepared with Insurance underwriter. Any transcriptional errors that result from this process are unintentional.

## 2016-01-26 NOTE — Patient Instructions (Signed)
Metformin take daily 2x all the time  Check blood sugars on waking up 3 times a week   Also check blood sugars about 2 hours after a meal and do this after different meals by rotation  Recommended blood sugar levels on waking up is 90-130 and about 2 hours after meal is 130-160  Please bring your blood sugar monitor to each visit, thank you   Take Toujeo 35 units at dinner not in am, if am sugar stays<90 reduce dose to 30   If eating less starch or small meals especially at supper reduce humalog to 30  Take humalog with you when eating out  La metformina toma diariamente 2x todo el tiempo  Compruebe los niveles de azcar en la sangre al despertarse 3 veces por semana  Tambin compruebe los azcares de la sangre aproximadamente 2 horas despus de [place] comida y haga esto despus de diversas comidas por la rotacin Los niveles recomendados de azcar en la sangre al despertar es de 90-130 y aproximadamente 2 horas despus de la comida es de 130-160 Por favor, traiga su monitor de Bankerazcar en la sangre a cada visita, gracias  Tome Toujeo 35 unidades en la cena no en la maana, si el azcar se mantiene <90 reducir la dosis a 30  Si comer menos almidn o comidas pequeas especialmente en la cena reducir humalog a 30  Tome humalog con usted cuando comer fuera

## 2016-02-12 ENCOUNTER — Encounter: Payer: Self-pay | Admitting: Medical

## 2016-02-12 ENCOUNTER — Ambulatory Visit (INDEPENDENT_AMBULATORY_CARE_PROVIDER_SITE_OTHER): Payer: Medicare Other | Admitting: Medical

## 2016-02-12 VITALS — BP 120/76 | HR 78 | Temp 98.5°F | Ht 65.0 in | Wt 216.0 lb

## 2016-02-12 DIAGNOSIS — E785 Hyperlipidemia, unspecified: Secondary | ICD-10-CM | POA: Diagnosis not present

## 2016-02-12 DIAGNOSIS — Z794 Long term (current) use of insulin: Secondary | ICD-10-CM

## 2016-02-12 DIAGNOSIS — Z113 Encounter for screening for infections with a predominantly sexual mode of transmission: Secondary | ICD-10-CM | POA: Diagnosis not present

## 2016-02-12 DIAGNOSIS — I10 Essential (primary) hypertension: Secondary | ICD-10-CM | POA: Diagnosis not present

## 2016-02-12 DIAGNOSIS — E119 Type 2 diabetes mellitus without complications: Secondary | ICD-10-CM

## 2016-02-12 NOTE — Patient Instructions (Addendum)
For your htn continue current medications. Your bp is well controlled.  For diabetes continue current regimen rx'd by Dr. Lucianne MussKumar. You are very close to  a1-c of 7.0. Much improved from 2 years ago.  Continue lipitor but will get cmp and lipid panel tomorrow in am fasting. Also will get hiv screening.  Follow up in 3 months or as needed

## 2016-02-12 NOTE — Progress Notes (Signed)
Subjective:    Patient ID: Samuel [name], male    DOB: [birth year], 65 y.o.   MRN: [id number]  HPI   Pt in for evaluation.   Pt feels good today.  Pt states he went to UC  about 3 weeks ago. Pt state had fever and cough. He went to urgent care. Pt was given antibiotic and got better quickely.  Pt has been going to endocrinologist. Recent a1-c 7.7. Was 9.7 in 2015. Pt is compliant on his medications. Eating better. Checked feet this January. Pt states he is checking feet and no concerns.  Pt lipids have been high in past. But recently not checked. In January was very well controlled except mild low hdl.  Pt bp is 120/76. It is well controlled. No cardiac or neurologic signs or symptoms. No side effect with meds.  Will get hiv screen today.    Review of Systems  Constitutional: Negative for fever, chills, diaphoresis, activity change and fatigue.  Respiratory: Negative for cough, chest tightness and shortness of breath.   Cardiovascular: Negative for chest pain, palpitations and leg swelling.  Gastrointestinal: Negative for nausea, vomiting and abdominal pain.  Musculoskeletal: Negative for back pain, neck pain and neck stiffness.  Skin: Negative for rash.  Neurological: Negative for dizziness, seizures, syncope, facial asymmetry, speech difficulty, weakness and light-headedness.  Psychiatric/Behavioral: Negative for behavioral problems, confusion and agitation. The patient is not nervous/anxious.     Past Medical History  Diagnosis Date  . Hypertension   . Diabetes mellitus without complication (HCC)   . Blood transfusion without reported diagnosis   . History of ETOH abuse     Social History   Social History  . Marital Status: Married    Spouse Name: N/A  . Number of Children: N/A  . Years of Education: N/A   Occupational History  . Not on file.   Social History Main Topics  . Smoking status: Former Games developer  . Smokeless tobacco: Former Neurosurgeon    Quit date:  11/08/1993  . Alcohol Use: No  . Drug Use: No  . Sexual Activity: Not on file   Other Topics Concern  . Not on file   Social History Narrative    Past Surgical History  Procedure Laterality Date  . Back surgery N/A 2010    Family History  Problem Relation Age of Onset  . Diabetes    . Hypertension    . Arthritis Father   . Diabetes Father     No Known Allergies  Current Outpatient Prescriptions on File Prior to Visit  Medication Sig Dispense Refill  . albuterol (PROVENTIL HFA;VENTOLIN HFA) 108 (90 Base) MCG/ACT inhaler Inhale 2 puffs into the lungs every 6 (six) hours as needed for wheezing or shortness of breath. 1 Inhaler 0  . aspirin 325 MG tablet Take 81 mg by mouth daily.     [name] atorvastatin (LIPITOR) 40 MG tablet TAKE 1 TABLET BY MOUTH DAILY AND ONE-HALF TABLET AT BEDTIME 45 tablet 3  . azithromycin (ZITHROMAX) 250 MG tablet 2 tabs po on day 1, then 1 tab po for 4 days 6 tablet 0  . cholecalciferol (VITAMIN D) 1000 UNITS tablet Take 1,000 Units by mouth daily. Reported on 11/05/2015    . Cholecalciferol (VITAMIN D3) 1000 UNITS CAPS Reported on 11/05/2015  6  . Dulaglutide (TRULICITY) 0.75 MG/0.5ML SOPN Inject weekly 4 pen 3  . Insulin Glargine (TOUJEO SOLOSTAR) 300 UNIT/ML SOPN Inject 35 Units into the skin every morning. 3 pen 3  .  insulin lispro (HUMALOG KWIKPEN) 100 UNIT/ML KiwkPen ADMINISTER 35 UNITS UNDER THE SKIN BEFORE MEALS 100 mL 1  . Insulin Pen Needle 31G X 5 MM MISC Use 5 per day 150 each 3  . losartan (COZAAR) 50 MG tablet TAKE 1 TABLET(50 MG) BY MOUTH DAILY 90 tablet 0  . metFORMIN (GLUCOPHAGE) 1000 MG tablet TAKE 1 TABLET BY MOUTH TWICE DAILY WITH A MEAL 180 tablet 2  . ONE TOUCH ULTRA TEST test strip CHECK BLOOD SUGAR THREE TIMES DAILY 300 each 1   No current facility-administered medications on file prior to visit.    BP 120/76 mmHg  Pulse 78  Temp(Src) 98.5 F (36.9 C) (Oral)  Ht 5\' 5"  (1.651 m)  Wt 216 lb (97.977 kg)  BMI 35.94 kg/m2  SpO2  95%       Objective:   Physical Exam  General Mental Status- Alert. General Appearance- Not in acute distress.   Skin General: Color- Normal Color. Moisture- Normal Moisture.  Neck Carotid Arteries- Normal color. Moisture- Normal Moisture. No carotid bruits. No JVD.  Chest and Lung Exam Auscultation: Breath Sounds:-Normal.  Cardiovascular Auscultation:Rythm- Regular. Murmurs & Other Heart Sounds:Auscultation of the heart reveals- No Murmurs.  Abdomen Inspection:-Inspeection Normal. Palpation/Percussion:Note:No mass. Palpation and Percussion of the abdomen reveal- Non Tender, Non Distended + BS, no rebound or guarding.    Neurologic Cranial Nerve exam:- CN III-XII intact(No nystagmus), symmetric smile. Strength:- 5/5 equal and symmetric strength both upper and lower extremities.      Assessment & Plan:  For your htn continue current medications.   For diabetes continue current regimen rx'd by Dr. Lucianne MussKumar. You are very close to 7.0. Much improved from 2 years ago.  Continue lipitor but will get cmp and lipid panel tomorrow in am fasting. Also will get hiv screening.   Follow up in 3 months or as needed

## 2016-02-12 NOTE — Progress Notes (Signed)
Pre visit review using our clinic review tool, if applicable. No additional management support is needed unless otherwise documented below in the visit note. 

## 2016-02-13 ENCOUNTER — Telehealth: Payer: Self-pay | Admitting: Medical

## 2016-02-13 ENCOUNTER — Other Ambulatory Visit (INDEPENDENT_AMBULATORY_CARE_PROVIDER_SITE_OTHER): Payer: Medicare Other

## 2016-02-13 DIAGNOSIS — E785 Hyperlipidemia, unspecified: Secondary | ICD-10-CM

## 2016-02-13 DIAGNOSIS — I10 Essential (primary) hypertension: Secondary | ICD-10-CM | POA: Diagnosis not present

## 2016-02-13 DIAGNOSIS — Z113 Encounter for screening for infections with a predominantly sexual mode of transmission: Secondary | ICD-10-CM

## 2016-02-13 LAB — LIPID PANEL
Cholesterol: 102 mg/dL (ref 0–200)
HDL: 41.2 mg/dL (ref >39.00)
NonHDL: 60.8
Total CHOL/HDL Ratio: 2
Triglycerides: 215 mg/dL — ABNORMAL HIGH (ref 0.0–149.0)
VLDL: 43 mg/dL — AB (ref 0.0–40.0)

## 2016-02-13 LAB — HIV ANTIBODY (ROUTINE TESTING W REFLEX): HIV: NONREACTIVE

## 2016-02-13 LAB — COMPREHENSIVE METABOLIC PANEL
ALK PHOS: 53 U/L (ref 39–117)
ALT: 30 U/L (ref 0–53)
AST: 25 U/L (ref 0–37)
Albumin: 4.4 g/dL (ref 3.5–5.2)
BUN: 16 mg/dL (ref 6–23)
CO2: 30 mEq/L (ref 19–32)
Calcium: 9.3 mg/dL (ref 8.4–10.5)
Chloride: 99 mEq/L (ref 96–112)
Creatinine, Ser: 0.86 mg/dL (ref 0.40–1.50)
GFR: 94.97 mL/min (ref >60.00)
GLUCOSE: 195 mg/dL — AB (ref 70–99)
POTASSIUM: 4.2 meq/L (ref 3.5–5.1)
Sodium: 136 mEq/L (ref 135–145)
TOTAL PROTEIN: 7.1 g/dL (ref 6.0–8.3)
Total Bilirubin: 0.8 mg/dL (ref 0.2–1.2)

## 2016-02-13 LAB — LDL CHOLESTEROL, DIRECT: Direct LDL: 40 mg/dL

## 2016-02-13 NOTE — Telephone Encounter (Signed)
Pt was informed about his lab results, pt understood and was ok with information and states is willing to do diet to get his lab level to regular. Pt was explained if any other question to let us know.

## 2016-03-24 ENCOUNTER — Other Ambulatory Visit: Payer: Self-pay | Admitting: Endocrinology

## 2016-03-26 ENCOUNTER — Other Ambulatory Visit: Payer: Self-pay | Admitting: Medical

## 2016-04-29 ENCOUNTER — Ambulatory Visit (INDEPENDENT_AMBULATORY_CARE_PROVIDER_SITE_OTHER): Payer: Medicare Other | Admitting: Endocrinology

## 2016-04-29 ENCOUNTER — Encounter: Payer: Self-pay | Admitting: Endocrinology

## 2016-04-29 VITALS — BP 132/82 | HR 74 | Ht 65.0 in | Wt 209.0 lb

## 2016-04-29 DIAGNOSIS — E1165 Type 2 diabetes mellitus with hyperglycemia: Secondary | ICD-10-CM

## 2016-04-29 DIAGNOSIS — Z794 Long term (current) use of insulin: Secondary | ICD-10-CM

## 2016-04-29 LAB — MICROALBUMIN / CREATININE URINE RATIO
Creatinine,U: 181.9 mg/dL
Microalb Creat Ratio: 0.6 mg/g (ref 0.0–30.0)
Microalb, Ur: 1.1 mg/dL (ref 0.0–1.9)

## 2016-04-29 LAB — POCT GLYCOSYLATED HEMOGLOBIN (HGB A1C): Hemoglobin A1C: 8.1

## 2016-04-29 NOTE — Patient Instructions (Addendum)
Toujeo 35 units at dinner only  If am sugar stays <100 reduce it to 30  Humalog 35 before each meal, 30 if small meal and 40 for larger meals  Check blood sugars on waking up  3-4 times a week Also check blood sugars about 2 hours after a meal and do this after different meals by rotation  Recommended blood sugar levels on waking up is 90-130 and about 2 hours after meal is 130-160  Please bring your blood sugar monitor to each visit, thank you   Toujeo 35 unidades en cena solamente  Si el azcar se mantiene <100, reduzca a 30  Humalog 35 antes de cada comida, 30 si comida pequea y 40 para comidas ms grandes  Compruebe los niveles de azcar en la sangre al despertar 3-4 veces a la semana Tambin compruebe los azcares de la sangre aproximadamente 2 horas despus de una comida y haga esto despus de diversas comidas por la rotacin  Los niveles recomendados de azcar en la sangre al despertar es de 90-130 y aproximadamente 2 horas despus de la comida es de 130-160  Por favor, traiga su monitor de azcar en la sangre a cada visita, gracias

## 2016-04-29 NOTE — Progress Notes (Signed)
Patient ID: Samuel [name], male   DOB: 04-21-1951, 65 y.o.   MRN: [id number]            Reason for Appointment:  Followup for Type 2 Diabetes  Referring physician: Saguire  History of Present Illness:          Diagnosis: Type 2 diabetes mellitus, date of diagnosis:  1990      Past history: He thinks he has been on insulin for the last 10-12 years. Previously had been on metformin which has been continued. He was probably tried on different insulin regimens initially but has been taking 70/30 mostly because of cost Previous records are not available and appears that his sugars have been poorly controlled for several years He was  referred here because of an A1c in 8/15 of 9.7%. He was on a regimen of Novolin 70/30 twice a day but because of inadequate control and higher readings later in the day he was switched to basal bolus insulin regimen in 12/15.  Recent history:  History obtained through interpreter  INSULIN regimen is described as:  Humalog 45 units before meal times and 35 units of Toujeo 0  His compliance with instructions for day-to-day management of his diabetes is still poor and he has not made any changes that were recommended on his last visit He has however started taking Trulicity Previously A1c has been generally over 8-9% and although it was improved in March it is back to 8.1  Current management, blood sugar patterns and problems identified:  He is again changing his management regimen arbitrarily on his own and not following instructions for his management  Now he is not taking any Toujeo and only taking Humalog since he cannot understand the differences between the 2 insulins and the role of basal insulin.  On the last visit he was told to try taking Toujeo in the evening to have less hyperglycemia fasting and less tendency to low sugars during the day.  He has no difficulty affording the insulin  He increased his Humalog by 10 units on his own  He stopped  Trulicity because he thought he does not helping him  Although he is having some readings over 300 he is not complaining of excessive thirst or urination but has lost weight from his high readings  He is trying to walk more  FASTING blood sugars are fairly consistently high but blood sugars are variable rest of the day.  Generally higher after supper       Oral hypoglycemic drugs the patient is taking are:   metformin 1 g twice a day     Side effects from medications have been: ?  Nausea/dizziness on Victoza  Compliance with the medical regimen: Poor  Hypoglycemia:   once after supper and once at 2 AM   Glucose monitoring:  done mostly once a day         Glucometer:  One Touch ultra 2     Blood Glucose readings by time of day by review of meter download  Mean values apply above for all meters except median for One Touch  PRE-MEAL Fasting Lunch Dinner Bedtime Overall  Glucose range: 130-269   75-384  71-305  97-326    Mean/median: 200  225  249  199    Self-care: The diet that the patient has been following is: None, not Always controlling portions and may have high carbohydrate meals especially breakfast    Meals: 3 meals per day. Breakfast at 8-9 am  is various kinds of bread, milk and eggs.  Dinner at 6 pm    Dietician visit, most recent: 5/16 Last CDE visit: 3/16                 Exercise: Walking about 1 mile,At least 3 days a week  Weight history: Previous range 200-220  Wt Readings from Last 3 Encounters:  04/29/16 209 lb (94.802 kg)  02/12/16 216 lb (97.977 kg)  01/26/16 214 lb 3.2 oz (97.16 kg)    Glycemic control:   Lab Results  Component Value Date   HGBA1C 8.1 04/29/2016   HGBA1C 7.7 01/26/2016   HGBA1C 7.7 11/05/2015   Lab Results  Component Value Date   MICROALBUR 1.1 04/29/2016   LDLCALC 29 11/19/2015   CREATININE 0.86 02/13/2016         Medication List       This list is accurate as of: 04/29/16  8:59 PM.  Always use your most recent med list.                 albuterol 108 (90 Base) MCG/ACT inhaler  Commonly known as:  PROVENTIL HFA;VENTOLIN HFA  Inhale 2 puffs into the lungs every 6 (six) hours as needed for wheezing or shortness of breath.     aspirin 325 MG tablet  Take 81 mg by mouth daily.     atorvastatin 40 MG tablet  Commonly known as:  LIPITOR  TAKE 1 TABLET BY MOUTH DAILY AND 1/2 TABLET AT BEDTIME     B-D UF III MINI PEN NEEDLES 31G X 5 MM Misc  Generic drug:  Insulin Pen Needle  USE FIVE PER DAY AS DIRECTED     cholecalciferol 1000 units tablet  Commonly known as:  VITAMIN D  Take 1,000 Units by mouth daily. Reported on 11/05/2015     Vitamin D3 1000 units Caps  Reported on 11/05/2015     Dulaglutide 0.75 MG/0.5ML Sopn  Commonly known as:  TRULICITY  Inject weekly     Insulin Glargine 300 UNIT/ML Sopn  Commonly known as:  TOUJEO SOLOSTAR  Inject 35 Units into the skin every morning.     insulin lispro 100 UNIT/ML KiwkPen  Commonly known as:  HUMALOG KWIKPEN  ADMINISTER 35 UNITS UNDER THE SKIN BEFORE MEALS     losartan 50 MG tablet  Commonly known as:  COZAAR  TAKE 1 TABLET(50 MG) BY MOUTH DAILY     metFORMIN 1000 MG tablet  Commonly known as:  GLUCOPHAGE  TAKE 1 TABLET BY MOUTH TWICE DAILY WITH A MEAL     ONE TOUCH ULTRA TEST test strip  Generic drug:  glucose blood  CHECK BLOOD SUGAR THREE TIMES DAILY        Allergies: No Known Allergies  Past Medical History  Diagnosis Date  . Hypertension   . Diabetes mellitus without complication (HCC)   . Blood transfusion without reported diagnosis   . History of ETOH abuse     Past Surgical History  Procedure Laterality Date  . Back surgery N/A 2010    Family History  Problem Relation Age of Onset  . Diabetes    . Hypertension    . Arthritis Father   . Diabetes Father     Social History:  reports that he has quit smoking. He quit smokeless tobacco use about 22 years ago. He reports that he does not drink alcohol or use illicit  drugs.    Review of Systems  Lipids:  He has been on Lipitor for hypercholesterolemia since about 2011 good control       Lab Results  Component Value Date   CHOL 102 02/13/2016   HDL 41.20 02/13/2016   LDLCALC 29 11/19/2015   LDLDIRECT 40.0 02/13/2016   TRIG 215.0* 02/13/2016   CHOLHDL 2 02/13/2016       The blood pressure has been treated with losartan, prescribed by PCP   Last foot exam was in 1/17       Physical Examination:  BP 132/82 mmHg  Pulse 74  Ht  (1.651 m)  Wt 209 lb (94.802 kg)  BMI 34.78 kg/m2  SpO2 95%     ASSESSMENT:  Diabetes type 2, uncontrolled with obesity and BMI of 36 See history of present illness for detailed discussion of his current management, blood sugar patterns and problems identified His blood sugars have been poorly controlled especially with his stopping Toujeo as discussed above Only taking metformin and Humalog currently As expected his fasting readings are fairly consistently high  Discussed in detail the actions of basal and bolus insulins and the role of both as well as timing of taking both insulins   PLAN:   He was given printed instructions in Spanish and reviewed these in detail with him   Restart Toujeo.  Start with 35 units.  May need to reduce this by 5 units if fasting blood sugars start getting lower consistently below 100  For simplicity he will take 35 units of Humalog with every meal also  Consider restarting Trulicity if he gains excessive weight  Consistent monitoring at various times especially after meals in the evening  Take metformin consistently twice a day  Counseling time on subjects discussed above is over 50% of today's 25 minute visit   Patient Instructions  Toujeo 35 units at dinner only  If am sugar stays <100 reduce it to 30  Humalog 35 before each meal, 30 if small meal and 40 for larger meals  Check blood sugars on waking up  3-4 times a week Also check blood sugars  about 2 hours after a meal and do this after different meals by rotation  Recommended blood sugar levels on waking up is 90-130 and about 2 hours after meal is 130-160  Please bring your blood sugar monitor to each visit, thank you   Toujeo 35 unidades en cena solamente  Si el azcar se mantiene <100, reduzca a 30  Humalog 35 antes de cada comida, 30 si comida pequea y 40 para comidas ms grandes  Compruebe los niveles de Banker al despertar 3-4 veces a la semana Tambin compruebe los azcares de la sangre aproximadamente 2 horas despus de Physiological scientist comida y haga esto despus de diversas comidas por la rotacin  Los niveles recomendados de azcar en la sangre al despertar es de 90-130 y aproximadamente 2 horas despus de la comida es de 130-160  Por favor, traiga su monitor de azcar en la sangre a cada visita, gracias      Counseling time on subjects discussed above is over 50% of today's 25 minute visit   Samuel [name] 04/29/2016, 8:59 PM   Note: This office note was prepared with Insurance underwriter. Any transcriptional errors that result from this process are unintentional.

## 2016-04-30 ENCOUNTER — Other Ambulatory Visit: Payer: Self-pay

## 2016-04-30 MED ORDER — INSULIN GLARGINE 300 UNIT/ML ~~LOC~~ SOPN: 35.0000 [IU] | PEN_INJECTOR | Freq: Every morning | SUBCUTANEOUS | Status: DC

## 2016-04-30 NOTE — Telephone Encounter (Signed)
Rx sent electronically.  

## 2016-05-13 ENCOUNTER — Ambulatory Visit (HOSPITAL_COMMUNITY): Payer: Medicare Other

## 2016-05-13 ENCOUNTER — Ambulatory Visit (INDEPENDENT_AMBULATORY_CARE_PROVIDER_SITE_OTHER): Payer: Medicare Other | Admitting: Medical

## 2016-05-13 ENCOUNTER — Encounter: Payer: Self-pay | Admitting: Medical

## 2016-05-13 ENCOUNTER — Ambulatory Visit (HOSPITAL_BASED_OUTPATIENT_CLINIC_OR_DEPARTMENT_OTHER)
Admission: RE | Admit: 2016-05-13 | Discharge: 2016-05-13 | Disposition: A | Discharge status: Home / Self Care | Payer: Medicare Other | Source: Ambulatory Visit | Attending: Medical | Admitting: Medical

## 2016-05-13 VITALS — BP 130/80 | HR 87 | Temp 98.1°F | Ht 65.0 in | Wt 211.4 lb

## 2016-05-13 DIAGNOSIS — M25562 Pain in left knee: Secondary | ICD-10-CM

## 2016-05-13 DIAGNOSIS — M25552 Pain in left hip: Secondary | ICD-10-CM | POA: Diagnosis not present

## 2016-05-13 DIAGNOSIS — M25551 Pain in right hip: Secondary | ICD-10-CM

## 2016-05-13 DIAGNOSIS — M25559 Pain in unspecified hip: Secondary | ICD-10-CM

## 2016-05-13 DIAGNOSIS — E785 Hyperlipidemia, unspecified: Secondary | ICD-10-CM

## 2016-05-13 DIAGNOSIS — M25561 Pain in right knee: Secondary | ICD-10-CM

## 2016-05-13 DIAGNOSIS — E119 Type 2 diabetes mellitus without complications: Secondary | ICD-10-CM | POA: Diagnosis not present

## 2016-05-13 DIAGNOSIS — I10 Essential (primary) hypertension: Secondary | ICD-10-CM

## 2016-05-13 DIAGNOSIS — Z794 Long term (current) use of insulin: Secondary | ICD-10-CM

## 2016-05-13 MED ORDER — CHOLINE FENOFIBRATE 45 MG PO CPDR: 45.0000 mg | DELAYED_RELEASE_CAPSULE | Freq: Every day | ORAL | Status: DC

## 2016-05-13 NOTE — Progress Notes (Signed)
Subjective:    Patient ID: Samuel SalinesLuis Szymczak, male    DOB: 03/05/1951, 65 y.o.   MRN: [id number]  HPI   Pt in states feeling well.   Pt in states both hip pain that is like tightness After sitting long periods has severe pain(past month worse). Also in leg and knee pain both sides. Pain mostly in hip and runs lateral aspect of thighs. His legs feel weak at times.  Pt has seen Dr.Kumar. His a1-c is still hovering around 8. Pt is following insulin treatment by Dr. Lucianne MussKumar. Pt is exercising. But he admits not following diet strictly.  Pt lipids are were high in past triglycerides. Pt is on atorvastatin. I don't see that he has been on fenofibrate.  Pt bp level is well controlled today. No cardiac or neurologic signs or symptoms.    Review of Systems  Constitutional: Negative for chills and fatigue.  Respiratory: Negative for cough, choking, shortness of breath and wheezing.   Cardiovascular: Negative for chest pain and palpitations.  Musculoskeletal: Negative for back pain.       Hip,thigh and knee pain.  Neurological: Negative for dizziness and headaches.  Hematological: Negative for adenopathy. Does not bruise/bleed easily.  Psychiatric/Behavioral: Negative for behavioral problems and confusion.   Past Medical History  Diagnosis Date  . Hypertension   . Diabetes mellitus without complication (HCC)   . Blood transfusion without reported diagnosis   . History of ETOH abuse      Social History   Social History  . Marital Status: Married    Spouse Name: N/A  . Number of Children: N/A  . Years of Education: N/A   Occupational History  . Not on file.   Social History Main Topics  . Smoking status: Former Games developermoker  . Smokeless tobacco: Former NeurosurgeonUser    Quit date: 11/08/1993  . Alcohol Use: No  . Drug Use: No  . Sexual Activity: Not on file   Other Topics Concern  . Not on file   Social History Narrative    Past Surgical History  Procedure Laterality Date  . Back surgery  N/A 2010    Family History  Problem Relation Age of Onset  . Diabetes    . Hypertension    . Arthritis Father   . Diabetes Father     No Known Allergies  Current Outpatient Prescriptions on File Prior to Visit  Medication Sig Dispense Refill  . aspirin 325 MG tablet Take 81 mg by mouth daily.     [name]. atorvastatin (LIPITOR) 40 MG tablet TAKE 1 TABLET BY MOUTH DAILY AND 1/2 TABLET AT BEDTIME 45 tablet 0  . B-D UF III MINI PEN NEEDLES 31G X 5 MM MISC USE FIVE PER DAY AS DIRECTED 200 each 0  . cholecalciferol (VITAMIN D) 1000 UNITS tablet Take 1,000 Units by mouth daily. Reported on 11/05/2015    . Dulaglutide (TRULICITY) 0.75 MG/0.5ML SOPN Inject weekly 4 pen 3  . Insulin Glargine (TOUJEO SOLOSTAR) 300 UNIT/ML SOPN Inject 35 Units into the skin every morning. 3 pen 3  . insulin lispro (HUMALOG KWIKPEN) 100 UNIT/ML KiwkPen ADMINISTER 35 UNITS UNDER THE SKIN BEFORE MEALS 100 mL 1  . losartan (COZAAR) 50 MG tablet TAKE 1 TABLET(50 MG) BY MOUTH DAILY 90 tablet 0  . metFORMIN (GLUCOPHAGE) 1000 MG tablet TAKE 1 TABLET BY MOUTH TWICE DAILY WITH A MEAL 180 tablet 2  . ONE TOUCH ULTRA TEST test strip CHECK BLOOD SUGAR THREE TIMES DAILY 300 each 1  No current facility-administered medications on file prior to visit.    BP 130/80 mmHg  Pulse 87  Temp(Src) 98.1 F (36.7 C) (Oral)  Ht 5\' 5"  (1.651 m)  Wt 211 lb 6.4 oz (95.89 kg)  BMI 35.18 kg/m2  SpO2 98%       Objective:   Physical Exam  General Mental Status- Alert. General Appearance- Not in acute distress.   Skin General: Color- Normal Color. Moisture- Normal Moisture.  Neck Carotid Arteries- Normal color. Moisture- Normal Moisture. No carotid bruits. No JVD.  Chest and Lung Exam Auscultation: Breath Sounds:-Normal.  Cardiovascular Auscultation:Rythm- Regular. Murmurs & Other Heart Sounds:Auscultation of the heart reveals- No Murmurs.  Abdomen Inspection:-Inspeection Normal. Palpation/Percussion:Note:No mass.  Palpation and Percussion of the abdomen reveal- Non Tender, Non Distended + BS, no rebound or guarding.   Neurologic Cranial Nerve exam:- CN III-XII intact(No nystagmus), symmetric smile. Strength:- 5/5 equal and symmetric strength both upper and lower extremities.  Back - no lumbar pain on palpation.  Hips- mild pain on palpation both sides.  Knees- mild pain on rom no crepitus.      Assessment & Plan:  For htn and diabetes. Continue current regimen. bp well controlled. Diabetes could improve. Discussed follow Dr. Lucianne MussKumar recommendations. Continue to exercise/walk if he can.  For hyperlipidemia will add fenofibrate and continue atorvastatin. Lipid panel in 3 months.Explained also may see drop in triglycerides if sugars decrease.  For hip and knee pain will get xrays. Rx diclofenac. Refer to sports med. May have iliotibial band syndrome.  Follow up in 3 months or as needed Jayleah Garbers, Ramon DredgeEdward, VF CorporationPA-C

## 2016-05-13 NOTE — Patient Instructions (Signed)
For htn and diabetes. Continue current regimen. bp well controlled. Diabetes could improve. Discussed follow Dr. Lucianne MussKumar recommendations. Continue to exercise/walk if he can.  For hyperlipidemia will add fenofibrate and continue atorvastatin. Lipid panel in 3 months.Explained also may see drop in triglycerides if sugars decrease.  For hip and knee pain will get xrays. Rx diclofenac. Refer to sports med. May have iliotibial band syndrome.  Follow up in 3 months or as needed

## 2016-05-13 NOTE — Progress Notes (Signed)
Pre visit review using our clinic review tool, if applicable. No additional management support is needed unless otherwise documented below in the visit note. 

## 2016-05-14 ENCOUNTER — Other Ambulatory Visit: Payer: Self-pay | Admitting: Medical

## 2016-05-14 ENCOUNTER — Ambulatory Visit (HOSPITAL_BASED_OUTPATIENT_CLINIC_OR_DEPARTMENT_OTHER)
Admission: RE | Admit: 2016-05-14 | Discharge: 2016-05-14 | Disposition: A | Discharge status: Home / Self Care | Payer: Medicare Other | Source: Ambulatory Visit | Attending: Medical | Admitting: Medical

## 2016-05-14 DIAGNOSIS — M25552 Pain in left hip: Secondary | ICD-10-CM

## 2016-05-14 DIAGNOSIS — M25562 Pain in left knee: Secondary | ICD-10-CM | POA: Insufficient documentation

## 2016-05-14 DIAGNOSIS — M170 Bilateral primary osteoarthritis of knee: Secondary | ICD-10-CM | POA: Insufficient documentation

## 2016-05-14 DIAGNOSIS — M25461 Effusion, right knee: Secondary | ICD-10-CM | POA: Insufficient documentation

## 2016-05-14 DIAGNOSIS — M25551 Pain in right hip: Secondary | ICD-10-CM

## 2016-05-14 DIAGNOSIS — M25561 Pain in right knee: Secondary | ICD-10-CM | POA: Diagnosis present

## 2016-05-19 ENCOUNTER — Ambulatory Visit (INDEPENDENT_AMBULATORY_CARE_PROVIDER_SITE_OTHER): Payer: Medicare Other | Admitting: Family Medicine

## 2016-05-19 ENCOUNTER — Encounter: Payer: Self-pay | Admitting: Family Medicine

## 2016-05-19 ENCOUNTER — Other Ambulatory Visit: Payer: Self-pay | Admitting: *Deleted

## 2016-05-19 ENCOUNTER — Other Ambulatory Visit: Payer: Self-pay | Admitting: Family Medicine

## 2016-05-19 VITALS — BP 123/66 | HR 66 | Ht 63.0 in | Wt 211.0 lb

## 2016-05-19 DIAGNOSIS — M5441 Lumbago with sciatica, right side: Secondary | ICD-10-CM | POA: Diagnosis not present

## 2016-05-19 DIAGNOSIS — M25561 Pain in right knee: Secondary | ICD-10-CM

## 2016-05-19 DIAGNOSIS — M25562 Pain in left knee: Secondary | ICD-10-CM

## 2016-05-19 DIAGNOSIS — M5442 Lumbago with sciatica, left side: Secondary | ICD-10-CM

## 2016-05-19 MED ORDER — DICLOFENAC SODIUM 75 MG PO TBEC: 75.0000 mg | DELAYED_RELEASE_TABLET | Freq: Two times a day (BID) | ORAL | Status: DC

## 2016-05-19 NOTE — Patient Instructions (Signed)
Your hip pain is radiating from your back. Do home exercises daily - strengthening of these and your abdominal muscles are key to the pain decreasing and resolving. Take diclofenac twice a day with food for pain and inflammation. Other options to consider: prednisone dose pack, repeating physical therapy, imaging (MRI), possible injection.  For your knee arthritis: These are the 4 medicines you can take for this: Tylenol 500mg  1-2 tabs three times a day for pain. Diclofenac twice a day with food as noted above Glucosamine sulfate 750mg  twice a day is a supplement that may help. Capsaicin, aspercreme, or biofreeze topically up to four times a day may also help with pain. Cortisone injections are an option. If cortisone injections do not help, there are different types of shots that may help but they take longer to take effect. It's important that you continue to stay active. Straight leg raises, knee extensions 3 sets of 10 once a day (add ankle weight if these become too easy). Consider physical therapy to strengthen muscles around the joint that hurts to take pressure off of the joint itself. Shoe inserts with good arch support may be helpful. Heat or ice 15 minutes at a time 3-4 times a day as needed to help with pain. Water aerobics and cycling with low resistance are the best two types of exercise for arthritis. Follow up with me in 5-6 weeks.

## 2016-05-24 DIAGNOSIS — M545 Low back pain, unspecified: Secondary | ICD-10-CM | POA: Insufficient documentation

## 2016-05-24 DIAGNOSIS — M25562 Pain in left knee: Secondary | ICD-10-CM

## 2016-05-24 DIAGNOSIS — M25561 Pain in right knee: Secondary | ICD-10-CM | POA: Insufficient documentation

## 2016-05-24 NOTE — Assessment & Plan Note (Signed)
concerning for radiculopathy vs spinal stenosis.  We discussed options - he would like to wait on repeating physical therapy at this time.  He will start with home exercise program which was shown today, diclofenac twice a day.  Prednisone dose pack, imaging (MRI) considerations in future.  F/u in 5-6 weeks.

## 2016-05-24 NOTE — Progress Notes (Signed)
PCP and consultation requested by: Esperanza RichtersSaguier, Edward, PA-C  Subjective:   HPI: Patient is a 65 y.o. male here for bilateral hip, back pain.  Patient reports several year history of back problems (herniation 8-9 years ago - had surgery). Current pain has been present for a year but worsened recently to 10/10 level. Pain is sharp. Not better with any specific motions, positions. Bilaterally posterior hips and radiates into thighs. No known injury or trauma. No skin changes. No bowel/bladder dysfunction. Gets numbness into right more than left legs into feet.  Past Medical History  Diagnosis Date  . Hypertension   . Diabetes mellitus without complication (HCC)   . Blood transfusion without reported diagnosis   . History of ETOH abuse     Current Outpatient Prescriptions on File Prior to Visit  Medication Sig Dispense Refill  . aspirin 325 MG tablet Take 81 mg by mouth daily.     [name]. atorvastatin (LIPITOR) 40 MG tablet TAKE 1 TABLET BY MOUTH DAILY AND 1/2 TABLET AT BEDTIME 45 tablet 0  . B-D UF III MINI PEN NEEDLES 31G X 5 MM MISC USE FIVE PER DAY AS DIRECTED 200 each 0  . cholecalciferol (VITAMIN D) 1000 UNITS tablet Take 1,000 Units by mouth daily. Reported on 11/05/2015    . Choline Fenofibrate 45 MG capsule Take 1 capsule (45 mg total) by mouth daily. 90 capsule 0  . Dulaglutide (TRULICITY) 0.75 MG/0.5ML SOPN Inject weekly 4 pen 3  . Insulin Glargine (TOUJEO SOLOSTAR) 300 UNIT/ML SOPN Inject 35 Units into the skin every morning. 3 pen 3  . insulin lispro (HUMALOG KWIKPEN) 100 UNIT/ML KiwkPen ADMINISTER 35 UNITS UNDER THE SKIN BEFORE MEALS 100 mL 1  . losartan (COZAAR) 50 MG tablet TAKE 1 TABLET(50 MG) BY MOUTH DAILY 90 tablet 0  . metFORMIN (GLUCOPHAGE) 1000 MG tablet TAKE 1 TABLET BY MOUTH TWICE DAILY WITH A MEAL 180 tablet 2  . ONE TOUCH ULTRA TEST test strip CHECK BLOOD SUGAR THREE TIMES DAILY 300 each 1   No current facility-administered medications on file prior to visit.     Past Surgical History  Procedure Laterality Date  . Back surgery N/A 2010    No Known Allergies  Social History   Social History  . Marital Status: Married    Spouse Name: N/A  . Number of Children: N/A  . Years of Education: N/A   Occupational History  . Not on file.   Social History Main Topics  . Smoking status: Former Games developermoker  . Smokeless tobacco: Former NeurosurgeonUser    Quit date: 11/08/1993  . Alcohol Use: No  . Drug Use: No  . Sexual Activity: Not on file   Other Topics Concern  . Not on file   Social History Narrative    Family History  Problem Relation Age of Onset  . Diabetes    . Hypertension    . Arthritis Father   . Diabetes Father     BP 123/66 mmHg  Pulse 66  Ht 5\' 3"  (1.6 m)  Wt 211 lb (95.709 kg)  BMI 37.39 kg/m2  Review of Systems: See HPI above.    Objective:  Physical Exam:  Gen: NAD, comfortable in exam room  Back: No gross deformity, scoliosis. No TTP.  No midline or bony TTP. FROM with pain on flexion. Strength LEs 5/5 all muscle groups.   2+ MSRs in patellar and achilles tendons, equal bilaterally. Negative SLRs. Sensation intact to light touch bilaterally. Negative logroll bilateral hips Negative  fabers and piriformis stretches.  Bilateral knees: No gross deformity, ecchymoses, effusions. No TTP. FROM. Negative ant/post drawers. Negative valgus/varus testing. Negative lachmanns. Negative mcmurrays, apleys, patellar apprehension. NV intact distally.    Assessment & Plan:  1. Back pain - concerning for radiculopathy vs spinal stenosis.  We discussed options - he would like to wait on repeating physical therapy at this time.  He will start with home exercise program which was shown today, diclofenac twice a day.  Prednisone dose pack, imaging (MRI) considerations in future.  F/u in 5-6 weeks.  2. Bilateral knee pain - also reported R > L knee pain - exam was benign, consistent with arthritis.  Discussed tylenol, diclofenac,  glucosamine, topical medications.  Shown home exercises to do daily also.  F/u in 5-6 weeks.

## 2016-05-24 NOTE — Assessment & Plan Note (Signed)
also reported R > L knee pain - exam was benign, consistent with arthritis.  Discussed tylenol, diclofenac, glucosamine, topical medications.  Shown home exercises to do daily also.  F/u in 5-6 weeks.

## 2016-06-06 ENCOUNTER — Other Ambulatory Visit: Payer: Self-pay | Admitting: Endocrinology

## 2016-06-10 ENCOUNTER — Encounter: Payer: Self-pay | Admitting: Endocrinology

## 2016-06-10 ENCOUNTER — Ambulatory Visit (INDEPENDENT_AMBULATORY_CARE_PROVIDER_SITE_OTHER): Payer: Medicare Other | Admitting: Endocrinology

## 2016-06-10 VITALS — BP 134/78 | HR 92 | Ht 63.0 in | Wt 207.0 lb

## 2016-06-10 DIAGNOSIS — E1165 Type 2 diabetes mellitus with hyperglycemia: Secondary | ICD-10-CM

## 2016-06-10 DIAGNOSIS — Z794 Long term (current) use of insulin: Secondary | ICD-10-CM | POA: Diagnosis not present

## 2016-06-10 NOTE — Patient Instructions (Signed)
INSULIN doses as follows:  Take Humalog and Toujeo both together at breakfast In the morning only take 20 units of Humalog at breakfast  At dinnertime if eating small amounts of starchy foods or higher fat foods may reduce the Humalog to 25 units instead of 35  Take at least 35 units of Humalog at lunchtime  Please take the Humalog at every meal even if the blood sugar is normal as the blood sugar will go up when you eat  Check more blood sugars after lunch and dinner,  may check blood sugar in the morning every other day  Call insurance about coverage for Trulicity  Las dosis de INSULINA son las siguientes:  Tomar Humalog y Toujeo juntos en el desayuno En la maana slo tomar 20 unidades de Humalog en el desayuno  A la hora de comer si comer pequeas cantidades de alimentos almidonados o alimentos con alto contenido de grasa puede reducir el Humalog a 25 unidades en lugar de 35  Tomar al menos 35 unidades de Humalog a la hora de comer  Por favor, tome el Humalog en cada comida, incluso si el azcar en la sangre es normal como el azcar en la sangre va a subir cuando usted come  Compruebe ms azcar en la sangre despus del almuerzo y Physicist, medical, [place] controlar el azcar en la sangre por la maana [employer]  Llame al seguro sobre la cobertura de Trulicity

## 2016-06-10 NOTE — Progress Notes (Signed)
Patient ID: Samuel [name], male   DOB: 1951-10-14, 65 y.o.   MRN: [id number]            Reason for Appointment:  Followup for Type 2 Diabetes  Referring physician: [place]  History of Present Illness:          Diagnosis: Type 2 diabetes mellitus, date of diagnosis:  1990      Past history: He thinks he has been on insulin for the last 10-12 years. Previously had been on metformin which has been continued. He was probably tried on different insulin regimens initially but has been taking 70/30 mostly because of cost Previous records are not available and appears that his sugars have been poorly controlled for several years He was  referred here because of an A1c in 8/15 of 9.7%. He was on a regimen of Novolin 70/30 twice a day but because of inadequate control and higher readings later in the day he was switched to basal bolus insulin regimen in 12/15.  Recent history:   INSULIN regimen is described as:  Humalog 45 units before lunch and dinner  and 35 units of Toujeo   His compliance with instructions for day-to-day management of his diabetes is still inadequate  Previously A1c has been generally over 8-9% and although it was improved in March it is back to 8.1  Current management, blood sugar patterns and problems identified:  He is again not following instructions given for his management  He was told to take Humalog with every meal as before but start taking Toujeo also in the morning  Now he is only taking Toujeo in the morning and no Humalog and does not understand the differences between the 2 insulins  Although he was probably doing better with Trulicity with better diet control and weight management he refuses to consider this  He has checked sugar readings mostly in the morning, some at lunch and sporadically in the evenings  His morning sugars are quite variable and still overall somewhat high  His blood sugars are mostly high at lunchtime  Blood sugars at suppertime  are also relatively higher but checked infrequently  Also has variable readings after 6 PM with the couple of readings over 200 and otherwise normal or low readings        Oral hypoglycemic drugs the patient is taking are:   metformin 1 g twice a day     Side effects from medications have been: ?  Nausea/dizziness on Victoza  Compliance with the medical regimen: Poor  Hypoglycemia:   once after supper and once at 2 AM   Glucose monitoring:  done mostly once a day         Glucometer:  One Touch ultra 2     Blood Glucose readings by time of day by review of meter download  Mean values apply above for all meters except median for One Touch  PRE-MEAL Fasting Lunch Dinner PCS  Overall  Glucose range: 96-243    61-258    Mean/median: 163  225    166   Self-care: The diet that the patient has been following is: Not consistently controlling portions and may have unbalanced carbohydrate meals especially breakfast    Meals: 3 meals per day. Breakfast at 8-9 am is various kinds of bread, milk and sometimes eggs.  Dinner at 6 pm    Dietician visit, most recent: 5/16 Last CDE visit: 3/16  Exercise: Walking about 1 mile,At least 3 days a week  Weight history: Previous range 200-220  Wt Readings from Last 3 Encounters:  06/10/16 207 lb (93.9 kg)  05/19/16 211 lb (95.7 kg)  05/13/16 211 lb 6.4 oz (95.9 kg)    Glycemic control:   Lab Results  Component Value Date   HGBA1C 8.1 04/29/2016   HGBA1C 7.7 01/26/2016   HGBA1C 7.7 11/05/2015   Lab Results  Component Value Date   MICROALBUR 1.1 04/29/2016   LDLCALC 29 11/19/2015   CREATININE 0.86 02/13/2016         Medication List       Accurate as of 06/10/16 11:59 PM. Always use your most recent med list.          aspirin 325 MG tablet Take 81 mg by mouth daily.   atorvastatin 40 MG tablet Commonly known as:  LIPITOR TAKE 1 TABLET BY MOUTH DAILY AND 1/2 TABLET AT BEDTIME   B-D UF III MINI PEN NEEDLES 31G X 5  MM Misc Generic drug:  Insulin Pen Needle USE FIVE PER DAY AS DIRECTED   B-D UF III MINI PEN NEEDLES 31G X 5 MM Misc Generic drug:  Insulin Pen Needle USE FIVE PER DAY AS DIRECTED   cholecalciferol 1000 units tablet Commonly known as:  VITAMIN D Take 1,000 Units by mouth daily. Reported on 11/05/2015   Choline Fenofibrate 45 MG capsule Take 1 capsule (45 mg total) by mouth daily.   diclofenac 75 MG EC tablet Commonly known as:  VOLTAREN Take 1 tablet (75 mg total) by mouth 2 (two) times daily.   Dulaglutide 0.75 MG/0.5ML Sopn Commonly known as:  TRULICITY Inject weekly   Insulin Glargine 300 UNIT/ML Sopn Commonly known as:  TOUJEO SOLOSTAR Inject 35 Units into the skin every morning.   insulin lispro 100 UNIT/ML KiwkPen Commonly known as:  HUMALOG KWIKPEN ADMINISTER 35 UNITS UNDER THE SKIN BEFORE MEALS   losartan 50 MG tablet Commonly known as:  COZAAR TAKE 1 TABLET(50 MG) BY MOUTH DAILY   metFORMIN 1000 MG tablet Commonly known as:  GLUCOPHAGE TAKE 1 TABLET BY MOUTH TWICE DAILY WITH A MEAL   ONE TOUCH ULTRA TEST test strip Generic drug:  glucose blood CHECK BLOOD SUGAR THREE TIMES DAILY       Allergies: No Known Allergies  Past Medical History:  Diagnosis Date  . Blood transfusion without reported diagnosis   . Diabetes mellitus without complication (HCC)   . History of ETOH abuse   . Hypertension     Past Surgical History:  Procedure Laterality Date  . BACK SURGERY N/A 2010    Family History  Problem Relation Age of Onset  . Diabetes    . Hypertension    . Arthritis Father   . Diabetes Father     Social History:  reports that he has quit smoking. He quit smokeless tobacco use about 22 years ago. He reports that he does not drink alcohol or use drugs.    Review of Systems         Lipids:  He has been on Lipitor for hypercholesterolemia since about 2011 good control       Lab Results  Component Value Date   CHOL 102 02/13/2016   HDL  41.20 02/13/2016   LDLCALC 29 11/19/2015   LDLDIRECT 40.0 02/13/2016   TRIG 215.0 (H) 02/13/2016   CHOLHDL 2 02/13/2016       The blood pressure has been treated with losartan, prescribed by  PCP   Last foot exam was in 1/17       Physical Examination:  BP 134/78 (BP Location: Left Arm, Patient Position: Sitting, Cuff Size: Normal)   Pulse 92   Ht 5\' 3"  (1.6 m)   Wt 207 lb (93.9 kg)   SpO2 97%   BMI 36.67 kg/m      ASSESSMENT:  Diabetes type 2, uncontrolled with obesity and BMI of 36 See history of present illness for detailed discussion of his current management, blood sugar patterns and problems identified His blood sugars have been Inadequately controlled with his variable compliance with instructions for his management As discussed above he does not understand the differences between the 2 kind of insulins Not taking any Humalog at breakfast and blood sugars are higher during the day especially at lunchtime and frequently after lunch also He tends to have  variable readings after his evening meal and this may influence his fasting readings the next morning also Blood sugars may be more evenly controlled with Trulicity but he does not want to do this However has lost a little weight  For simplicity discussed the option of taking the V-go pump.  Discussed how this would work and help his basal and bolus insulin requirements also.  However not clear if this would provide adequate mealtime boluses as he is taking a large amount for covering his meals However he is interested in trying this; may need a supper dose of mealtime insulin once a day with the V-go pump or the addition of Trulicity   PLAN:   He was given printed instructions in Spanish and reviewed these in detail with him   Restart Humalog at breakfast starting with 20 units since he is having relatively small meal  Increase Toujeo if fasting readings continue to be high  Encouraged him to take the mealtime  insulin consistently before eating despite having some good readings before the meal and discussed need to cover mealtimes spikes  Consider restarting Trulicity if he gains excessive weight  Consistent monitoring at various times especially  in the evening  Take metformin consistently twice a day  Recheck labs on the next visit  Will get the coverage verification 4V-go pump and he will start with the nurse educator  Counseling time on subjects discussed above is over 50% of today's 25 minute visit   Patient Instructions  INSULIN doses as follows:  Take Humalog and Toujeo both together at breakfast In the morning only take 20 units of Humalog at breakfast  At dinnertime if eating small amounts of starchy foods or higher fat foods may reduce the Humalog to 25 units instead of 35  Take at least 35 units of Humalog at lunchtime  Please take the Humalog at every meal even if the blood sugar is normal as the blood sugar will go up when you eat  Check more blood sugars after lunch and dinner,  may check blood sugar in the morning every other day  Call insurance about coverage for Trulicity  Las dosis de INSULINA son las siguientes:  Tomar Humalog y Toujeo juntos en el desayuno En la maana slo tomar 20 unidades de Humalog en el desayuno  A la hora de comer si comer pequeas cantidades de alimentos almidonados o alimentos con alto contenido de grasa puede reducir el Humalog a 25 unidades en lugar de 35  Tomar al menos 35 unidades de Humalog a la hora de comer  Por favor, tome el Humalog en cada comida,  incluso si el azcar en la sangre es normal como el azcar en la sangre va a subir cuando usted come  Compruebe ms azcar en la sangre despus del almuerzo y la cena, Puede controlar el azcar en la sangre por la maana cada [address]  Llame al seguro sobre la cobertura de Trulicity   Counseling time on subjects discussed above is over 50% of today's 25 minute  visit   [facility] 06/11/2016, 2:48 PM   Note: This office note was prepared with Insurance underwriter. Any transcriptional errors that result from this process are unintentional.

## 2016-06-14 ENCOUNTER — Telehealth: Payer: Self-pay | Admitting: Endocrinology

## 2016-06-14 NOTE — Telephone Encounter (Signed)
See below to be advised.  

## 2016-06-14 NOTE — Telephone Encounter (Signed)
Thalia Partytilia [facility] called stated that patient is covered for the vgo supplies copay from 0 to 10 dollars. [phone] any questions

## 2016-06-14 NOTE — Telephone Encounter (Signed)
Please make sure he is scheduled to see Bonita QuinLinda to start the V-go pump and see me at the end of the same week

## 2016-06-15 NOTE — Telephone Encounter (Signed)
Caitlin, Could you please call and schedule the pt with Bonita QuinLinda and Dr. Lucianne MussKumar.

## 2016-06-17 NOTE — Telephone Encounter (Signed)
Called PT twice, left messages.

## 2016-06-22 ENCOUNTER — Other Ambulatory Visit: Payer: Self-pay | Admitting: Endocrinology

## 2016-06-28 NOTE — Telephone Encounter (Signed)
Patient is returning your call.pb

## 2016-06-30 ENCOUNTER — Ambulatory Visit: Payer: Medicare Other | Admitting: Family Medicine

## 2016-06-30 NOTE — Telephone Encounter (Signed)
Pt scheduled for 08/06/2016 with Dr. Lucianne MussKumar and Bonita QuinLinda on 08/03/2016.

## 2016-07-04 ENCOUNTER — Other Ambulatory Visit: Payer: Self-pay | Admitting: Medical

## 2016-07-06 NOTE — Telephone Encounter (Signed)
Daughter checking on the status of message below, daughter states patient is completely out of medication and is leaving the country tomorrow morning at 6am. Daughter will call back with a pharmacy [place].

## 2016-07-06 NOTE — Telephone Encounter (Signed)
Caller name: Jonetta SpeakLuis  Relation to pt: self Call back number: [phone] [id number] - [place], [place] - [address] [facility]  [address]  Reason for call: Pt requesting needing refill for atorvastatin (LIPITOR) 40 MG tablet and refill on losartan (COZAAR) 50 MG tablet. Pt states is leaving town tomorrow and is needing his meds for today. Please advise ASAP.

## 2016-07-07 NOTE — Telephone Encounter (Signed)
Called pt and asked what pharmacy would be at his convenient, pt stated just arrived at the place he was traveling to and tomorrow he will call to inform us what pharmacy he can receive it at since he does not have that information at this moment and felt very tired to look for that information today, but that he will call us tomorrow or his daughter will call us tomorrow with the pharmacy info.

## 2016-07-07 NOTE — Telephone Encounter (Signed)
Atorvastatin 40 mg tablet Last filled:  03/29/16 Amt: 45,0  Losartan 50 mg tablet Last filled: 12/15/15 Amt: 90,0  Last OV:  05/13/16 Last Labs: 02/13/16  Next OV:  08/17/16  Samuel PihJackie will call daughter to see which pharmacy she would like for us to send prescriptions.

## 2016-07-09 ENCOUNTER — Other Ambulatory Visit: Payer: Self-pay

## 2016-07-09 MED ORDER — ATORVASTATIN CALCIUM 40 MG PO TABS: ORAL_TABLET | ORAL | 0 refills | Status: DC

## 2016-07-09 MED ORDER — LOSARTAN POTASSIUM 50 MG PO TABS: ORAL_TABLET | ORAL | 0 refills | Status: DC

## 2016-08-03 ENCOUNTER — Encounter: Payer: Medicare Other | Admitting: Nutrition

## 2016-08-03 DIAGNOSIS — Z1211 Encounter for screening for malignant neoplasm of colon: Secondary | ICD-10-CM | POA: Insufficient documentation

## 2016-08-04 ENCOUNTER — Telehealth: Payer: Self-pay | Admitting: Nutrition

## 2016-08-04 NOTE — Telephone Encounter (Signed)
Message left on machine to call and reschedule his apt. With me.

## 2016-08-05 ENCOUNTER — Other Ambulatory Visit: Payer: Self-pay | Admitting: *Deleted

## 2016-08-05 ENCOUNTER — Telehealth: Payer: Self-pay | Admitting: Endocrinology

## 2016-08-05 MED ORDER — INSULIN GLARGINE 300 UNIT/ML ~~LOC~~ SOPN: 35.0000 [IU] | PEN_INJECTOR | Freq: Every morning | SUBCUTANEOUS | 3 refills | Status: DC

## 2016-08-05 NOTE — Telephone Encounter (Signed)
Pt needs toujeo called into [facility] please

## 2016-08-05 NOTE — Telephone Encounter (Signed)
Rx sent 

## 2016-08-06 ENCOUNTER — Ambulatory Visit: Payer: Medicare Other | Admitting: Endocrinology

## 2016-08-10 ENCOUNTER — Encounter: Payer: Medicare Other | Admitting: Nutrition

## 2016-08-10 LAB — HM COLONOSCOPY

## 2016-08-15 ENCOUNTER — Other Ambulatory Visit: Payer: Self-pay | Admitting: Endocrinology

## 2016-08-17 ENCOUNTER — Other Ambulatory Visit: Payer: Self-pay | Admitting: Medical

## 2016-08-17 ENCOUNTER — Telehealth: Payer: Self-pay | Admitting: Medical

## 2016-08-17 ENCOUNTER — Encounter: Payer: Self-pay | Admitting: Medical

## 2016-08-17 ENCOUNTER — Ambulatory Visit (INDEPENDENT_AMBULATORY_CARE_PROVIDER_SITE_OTHER): Payer: Medicare Other | Admitting: Medical

## 2016-08-17 VITALS — BP 128/74 | HR 55 | Temp 98.7°F | Ht 63.0 in | Wt 204.6 lb

## 2016-08-17 DIAGNOSIS — I10 Essential (primary) hypertension: Secondary | ICD-10-CM | POA: Diagnosis not present

## 2016-08-17 DIAGNOSIS — N401 Enlarged prostate with lower urinary tract symptoms: Secondary | ICD-10-CM

## 2016-08-17 DIAGNOSIS — R350 Frequency of micturition: Secondary | ICD-10-CM

## 2016-08-17 DIAGNOSIS — M25561 Pain in right knee: Secondary | ICD-10-CM

## 2016-08-17 DIAGNOSIS — Z794 Long term (current) use of insulin: Secondary | ICD-10-CM

## 2016-08-17 DIAGNOSIS — Z23 Encounter for immunization: Secondary | ICD-10-CM

## 2016-08-17 DIAGNOSIS — E119 Type 2 diabetes mellitus without complications: Secondary | ICD-10-CM | POA: Diagnosis not present

## 2016-08-17 DIAGNOSIS — M25552 Pain in left hip: Secondary | ICD-10-CM

## 2016-08-17 DIAGNOSIS — E785 Hyperlipidemia, unspecified: Secondary | ICD-10-CM | POA: Diagnosis not present

## 2016-08-17 DIAGNOSIS — M25562 Pain in left knee: Secondary | ICD-10-CM

## 2016-08-17 DIAGNOSIS — M25551 Pain in right hip: Secondary | ICD-10-CM

## 2016-08-17 LAB — POC URINALSYSI DIPSTICK (AUTOMATED)
BILIRUBIN UA: NEGATIVE
Blood, UA: NEGATIVE
Glucose, UA: NEGATIVE
KETONES UA: NEGATIVE
Leukocytes, UA: NEGATIVE
Nitrite, UA: NEGATIVE
Protein, UA: NEGATIVE
SPEC GRAV UA: 1.025
Urobilinogen, UA: 0.2
pH, UA: 6

## 2016-08-17 LAB — COMPREHENSIVE METABOLIC PANEL
ALBUMIN: 4.3 g/dL (ref 3.5–5.2)
ALT: 24 U/L (ref 0–53)
AST: 20 U/L (ref 0–37)
Alkaline Phosphatase: 44 U/L (ref 39–117)
BILIRUBIN TOTAL: 0.7 mg/dL (ref 0.2–1.2)
BUN: 14 mg/dL (ref 6–23)
CALCIUM: 9.7 mg/dL (ref 8.4–10.5)
CO2: 31 meq/L (ref 19–32)
CREATININE: 0.74 mg/dL (ref 0.40–1.50)
Chloride: 102 mEq/L (ref 96–112)
GFR: 112.78 mL/min (ref >60.00)
Glucose, Bld: 155 mg/dL — ABNORMAL HIGH (ref 70–99)
Potassium: 4.4 mEq/L (ref 3.5–5.1)
SODIUM: 141 meq/L (ref 135–145)
Total Protein: 6.9 g/dL (ref 6.0–8.3)

## 2016-08-17 LAB — HM DIABETES EYE EXAM

## 2016-08-17 LAB — PSA: PSA: 0.41 ng/mL (ref 0.10–4.00)

## 2016-08-17 MED ORDER — TAMSULOSIN HCL 0.4 MG PO CAPS: 0.4000 mg | ORAL_CAPSULE | Freq: Every day | ORAL | 3 refills | Status: DC

## 2016-08-17 MED ORDER — MELOXICAM 7.5 MG PO TABS: ORAL_TABLET | ORAL | 0 refills | Status: DC

## 2016-08-17 NOTE — Progress Notes (Signed)
Subjective:    Patient ID: Samuel [name], male    DOB: [birth year], 65 y.o.   MRN: [id number]  HPI   Pt is for follow up.  Pt has good bp level today. He is on losartan.   Pt is feeling well. Pt is fasting so he can check is lipid panel today.  Pt states one week ago had colonoscopy. Told one polyp and will recheck in 5 years.  Pt also reports recently he has been frequently in the day. Also early in am urinating frequent. When he finishes flow will have some residual drops. Pt is on lipitor.   Pt is seeing his endocrinologist for diabetes. He got 140 level fasting this am. With urine symptoms he does not report increasing sugar levels.  Pt will get flu vaccine today.  Pt states he had eye exam. He states they told him his eye exam was good. No dibaetic eye disease per pt.  Pt did see sports medicine for his knee pain. Has known osteoarthritis of knee. Also some hip pain mostly. Pain runs down his legs at times. Dr. Andree CossHudnell had considered getting mri of low back.Pt had some impingement on 2015 mri. Also some stenosis.But he denies any pain in his back. No weakness of his legs.     Review of Systems  Constitutional: Negative for chills and fatigue.  Respiratory: Negative for cough, chest tightness, shortness of breath and wheezing.   Cardiovascular: Negative for chest pain and palpitations.  Gastrointestinal: Negative for abdominal pain, anal bleeding, constipation and vomiting.  Endocrine: Positive for polyuria. Negative for polydipsia and polyphagia.  Genitourinary: Positive for frequency. Negative for decreased urine volume, scrotal swelling and testicular pain.  Musculoskeletal: Negative for back pain.  Skin: Negative for pallor.  Neurological: Negative for dizziness and headaches.  Hematological: Negative for adenopathy. Does not bruise/bleed easily.  Psychiatric/Behavioral: Negative for behavioral problems.    Past Medical History:  Diagnosis Date  . Blood transfusion  without reported diagnosis   . Diabetes mellitus without complication (HCC)   . History of ETOH abuse   . Hypertension      Social History   Social History  . Marital status: Married    Spouse name: N/A  . Number of children: N/A  . Years of education: N/A   Occupational History  . Not on file.   Social History Main Topics  . Smoking status: Former Games developermoker  . Smokeless tobacco: Former NeurosurgeonUser    Quit date: 11/08/1993  . Alcohol use No  . Drug use: No  . Sexual activity: Not on file   Other Topics Concern  . Not on file   Social History Narrative  . No narrative on file    Past Surgical History:  Procedure Laterality Date  . BACK SURGERY N/A 2010    Family History  Problem Relation Age of Onset  . Diabetes    . Hypertension    . Arthritis Father   . Diabetes Father     No Known Allergies  Current Outpatient Prescriptions on File Prior to Visit  Medication Sig Dispense Refill  . aspirin 325 MG tablet Take 81 mg by mouth daily.     [name]. atorvastatin (LIPITOR) 40 MG tablet TAKE 1 TABLET BY MOUTH DAILY AND 1/2 TABLET AT BEDTIME 45 tablet 0  . B-D UF III MINI PEN NEEDLES 31G X 5 MM MISC USE FIVE PER DAY AS DIRECTED 200 each 0  . B-D UF III MINI PEN NEEDLES 31G X 5  MM MISC USE FIVE PER DAY AS DIRECTED 200 each 0  . cholecalciferol (VITAMIN D) 1000 UNITS tablet Take 1,000 Units by mouth daily. Reported on 11/05/2015    . Insulin Glargine (TOUJEO SOLOSTAR) 300 UNIT/ML SOPN Inject 35 Units into the skin every morning. 3 pen 3  . insulin lispro (HUMALOG KWIKPEN) 100 UNIT/ML KiwkPen ADMINISTER 35 UNITS UNDER THE SKIN BEFORE MEALS 100 mL 1  . losartan (COZAAR) 50 MG tablet TAKE 1 TABLET(50 MG) BY MOUTH DAILY 90 tablet 0  . metFORMIN (GLUCOPHAGE) 1000 MG tablet TAKE 1 TABLET BY MOUTH TWICE DAILY WITH MEALS 180 tablet 0  . ONE TOUCH ULTRA TEST test strip TEST THREE TIMES DAILY 300 each 0  . Choline Fenofibrate 45 MG capsule Take 1 capsule (45 mg total) by mouth daily. (Patient not  taking: Reported on 08/17/2016) 90 capsule 0  . diclofenac (VOLTAREN) 75 MG EC tablet Take 1 tablet (75 mg total) by mouth 2 (two) times daily. (Patient not taking: Reported on 08/17/2016) 180 tablet 0   No current facility-administered medications on file prior to visit.     BP 128/74   Pulse (!) 55   Temp 98.7 F (37.1 C) (Oral)   Ht 5\' 3"  (1.6 m)   Wt 204 lb 9.6 oz (92.8 kg)   SpO2 98%   BMI 36.24 kg/m       Objective:   Physical Exam  General Mental Status- Alert. General Appearance- Not in acute distress.   Skin General: Color- Normal Color. Moisture- Normal Moisture.  Neck Carotid Arteries- Normal color. Moisture- Normal Moisture. No carotid bruits. No JVD.  Chest and Lung Exam Auscultation: Breath Sounds:-Normal.  Cardiovascular Auscultation:Rythm- Regular. Murmurs & Other Heart Sounds:Auscultation of the heart reveals- No Murmurs.  Abdomen Inspection:-Inspeection Normal. Palpation/Percussion:Note:No mass. Palpation and Percussion of the abdomen reveal- Non Tender, Non Distended + BS, no rebound or guarding.    Neurologic Cranial Nerve exam:- CN III-XII intact(No nystagmus), symmetric smile. Strength:- 5/5 equal and symmetric strength both upper and lower extremities.    Rectal Anorectal Exam: Performed- Normal sphincter tone. No masses noted. Prostate smooth normal size(faint mild enlarged at best). Stool HEME Negative.  .      Assessment & Plan:  htn controlled today. Continue your losartan.  For hyperlipidemia check lipid panel today.  For diabetes continue insulin and follow up with endocrinogist.  For frequent urination will get psa and urine dip/culture.   After studies may rx flomax or antibiotic.   For hip and knee pain will rx meloxicam. Stop diclofenac.  Pt will get flu vaccine today and pneumonia vaccine  Follow up date to be determined after lab review.   [name], Ramon Dredge, PA-C

## 2016-08-17 NOTE — Telephone Encounter (Signed)
Flomax sent to his pharmacy. Also rx meloxicam sent to his pharmacy.

## 2016-08-17 NOTE — Patient Instructions (Addendum)
htn controlled today. Continue your losartan.  For hyperlipidemia check lipid panel today.  For diabetes continue insulin and follow up with endocrinogist.  For frequent urination will get psa and urine dip/culture.   After studies may rx flomax or antibiotic.   For hip and knee pain will rx meloxicam. Stop diclofenac.  Pt will get flu vaccine today and pneumonia vaccine  Follow up date to be determined after lab review.

## 2016-08-17 NOTE — Progress Notes (Signed)
Pre visit review using our clinic tool,if applicable. No additional management support is needed unless otherwise documented below in the visit note.  

## 2016-08-17 NOTE — Telephone Encounter (Signed)
Will you abstract colonscopy. He had done one week ago. Told had polyp. Repeat in 5 yrs.  Also states eye exam this past year and told negative findings for diabetic eye disease. Please abstract that as well.

## 2016-08-18 LAB — LIPID PANEL
CHOLESTEROL: 105 mg/dL (ref 0–200)
HDL: 48.1 mg/dL (ref >39.00)
LDL Cholesterol: 35 mg/dL (ref 0–99)
NONHDL: 57.29
Total CHOL/HDL Ratio: 2
Triglycerides: 110 mg/dL (ref 0.0–149.0)
VLDL: 22 mg/dL (ref 0.0–40.0)

## 2016-08-18 LAB — URINE CULTURE: ORGANISM ID, BACTERIA: NO GROWTH

## 2016-08-18 NOTE — Addendum Note (Signed)
Addended by: Harley AltoPRICE, Geoge Lawrance M on: 08/18/2016 08:21 AM   Modules accepted: Orders

## 2016-08-22 ENCOUNTER — Other Ambulatory Visit: Payer: Self-pay | Admitting: Endocrinology

## 2016-08-25 ENCOUNTER — Telehealth: Payer: Self-pay | Admitting: Medical

## 2016-08-25 NOTE — Telephone Encounter (Signed)
Pt was informed about his labs.

## 2016-09-12 ENCOUNTER — Other Ambulatory Visit: Payer: Self-pay | Admitting: Endocrinology

## 2016-09-15 ENCOUNTER — Other Ambulatory Visit: Payer: Self-pay | Admitting: Endocrinology

## 2016-09-16 ENCOUNTER — Other Ambulatory Visit: Payer: Self-pay

## 2016-09-16 ENCOUNTER — Ambulatory Visit (INDEPENDENT_AMBULATORY_CARE_PROVIDER_SITE_OTHER): Payer: Medicare Other | Admitting: Endocrinology

## 2016-09-16 ENCOUNTER — Encounter: Payer: Self-pay | Admitting: Endocrinology

## 2016-09-16 VITALS — HR 52 | Wt 208.0 lb

## 2016-09-16 DIAGNOSIS — E119 Type 2 diabetes mellitus without complications: Secondary | ICD-10-CM

## 2016-09-16 DIAGNOSIS — Z794 Long term (current) use of insulin: Secondary | ICD-10-CM

## 2016-09-16 DIAGNOSIS — E1165 Type 2 diabetes mellitus with hyperglycemia: Secondary | ICD-10-CM

## 2016-09-16 LAB — POCT GLYCOSYLATED HEMOGLOBIN (HGB A1C): Hemoglobin A1C: 8

## 2016-09-16 MED ORDER — DULAGLUTIDE 0.75 MG/0.5ML ~~LOC~~ SOAJ: 0.7500 mg | SUBCUTANEOUS | 3 refills | Status: DC

## 2016-09-16 MED ORDER — INSULIN PEN NEEDLE 31G X 5 MM MISC: 0 refills | Status: DC

## 2016-09-16 NOTE — Progress Notes (Signed)
Patient ID: Samuel [name], male   DOB: 1951/02/02, 65 y.o.   MRN: [id number]            Reason for Appointment:  Followup for Type 2 Diabetes  Referring physician: Saguier  History of Present Illness:          Diagnosis: Type 2 diabetes mellitus, date of diagnosis:  1990      Past history: He thinks he has been on insulin for the last 10-12 years. Previously had been on metformin which has been continued. He was probably tried on different insulin regimens initially but has been taking 70/30 mostly because of cost Previous records are not available and appears that his sugars have been poorly controlled for several years He was  referred here because of an A1c in 8/15 of 9.7%. He was on a regimen of Novolin 70/30 twice a day but because of inadequate control and higher readings later in the day he was switched to basal bolus insulin regimen in 12/15.  Recent history:   INSULIN regimen is described as:  Humalog 35 units before lunch and dinner and 35 units of Toujeo   His compliance with instructions for day-to-day management of his diabetes is still inadequate  Previously A1c has been generally over 8-9% and although it was improved in March it is still high at 8%  Current management, blood sugar patterns and problems identified:  He is again not being consistent with his regimen  [place] and did not [place] enough Toujeo and he says he started his back only about a week or so ago  He is however recently taking Humalog at every meal including breakfast as directed  He has checked his blood sugars 2 or 3 times a day but not consistently after evening meal  Even with starting back on his TOUJEO his fasting blood sugars are still mostly over 200  MEALTIME insulin: Although he is taking the same dose at every meal he has variable responses and may be having different amounts of carbohydrate such as rice or other starchy foods  He does not understand the need to  take his insulin with him when he goes out to eat and did not know he can keep it at room temperature.  Occasionally with taking the insulin later after supper he will get a low sugar during the night  He is still interested in the V-go pump and does not like multiple injections  He had refused to take Trulicity because of need for another injection  HIGHEST blood sugars on an average or later in the evening, highest 365, recently has not had an adequate level of control with 79% of readings above target        Oral hypoglycemic drugs the patient is taking are:   metformin 1 g twice a day     Side effects from medications have been: ?  Nausea/dizziness on Victoza  Compliance with the medical regimen: Poor  Hypoglycemia:   rarely, has only one low sugar at 1 AM  Glucose monitoring:  done mostly once a day         Glucometer:  One Touch ultra 2     Blood Glucose readings by time of day by review of meter download  Mean values apply above for all meters except median for One Touch  PRE-MEAL Fasting Lunch Dinner Bedtime Overall  Glucose range: 120-297   75-323  219-365    Mean/median: 206  193  179  280  208     Self-care: The diet that the patient has been following is: Not consistently controlling portions and may have unbalanced carbohydrate meals especially breakfast    Meals: 3 meals per day. Breakfast at 8-9 am is various kinds of bread, milk and sometimes eggs.  Dinner at 6 pm    Dietician visit, most recent: 5/16 Last CDE visit: 3/16                 Exercise: Walking about 1 mile,At least 3 days a week  Weight history: Previous range 200-220  Wt Readings from Last 3 Encounters:  09/16/16 208 lb (94.3 kg)  08/17/16 204 lb 9.6 oz (92.8 kg)  06/10/16 207 lb (93.9 kg)    Glycemic control:   Lab Results  Component Value Date   HGBA1C 8.0 09/16/2016   HGBA1C 8.1 04/29/2016   HGBA1C 7.7 01/26/2016   Lab Results  Component Value Date   MICROALBUR 1.1 04/29/2016    LDLCALC 35 08/18/2016   CREATININE 0.74 08/17/2016         Medication List       Accurate as of 09/16/16 11:59 PM. Always use your most recent med list.          aspirin 325 MG tablet Take 81 mg by mouth daily.   atorvastatin 40 MG tablet Commonly known as:  LIPITOR TAKE 1 TABLET BY MOUTH DAILY AND 1/2 TABLET AT BEDTIME   B-D UF III MINI PEN NEEDLES 31G X 5 MM Misc Generic drug:  Insulin Pen Needle USE FIVE PER DAY AS DIRECTED   B-D UF III MINI PEN NEEDLES 31G X 5 MM Misc Generic drug:  Insulin Pen Needle USE FIVE PER DAY AS DIRECTED   Insulin Pen Needle 31G X 5 MM Misc Commonly known as:  B-D UF III MINI PEN NEEDLES USE FIVE PER DAY AS DIRECTED   cholecalciferol 1000 units tablet Commonly known as:  VITAMIN D Take 1,000 Units by mouth daily. Reported on 11/05/2015   Choline Fenofibrate 45 MG capsule Take 1 capsule (45 mg total) by mouth daily.   Dulaglutide 0.75 MG/0.5ML Sopn Commonly known as:  TRULICITY Inject 0.75 mg into the skin once a week.   HUMALOG KWIKPEN 100 UNIT/ML KiwkPen Generic drug:  insulin lispro INJECT 35 UNITS UNDER THE SKIN BEFORE MEALS   Insulin Glargine 300 UNIT/ML Sopn Commonly known as:  TOUJEO SOLOSTAR Inject 35 Units into the skin every morning.   losartan 50 MG tablet Commonly known as:  COZAAR TAKE 1 TABLET(50 MG) BY MOUTH DAILY   meloxicam 7.5 MG tablet Commonly known as:  MOBIC TAKE 1 TO 2 TABLETS BY MOUTH EVERY DAY   metFORMIN 1000 MG tablet Commonly known as:  GLUCOPHAGE TAKE 1 TABLET BY MOUTH TWICE DAILY WITH MEALS   ONE TOUCH ULTRA TEST test strip Generic drug:  glucose blood TEST THREE TIMES DAILY   tamsulosin 0.4 MG Caps capsule Commonly known as:  FLOMAX Take 1 capsule (0.4 mg total) by mouth daily.       Allergies: No Known Allergies  Past Medical History:  Diagnosis Date  . Blood transfusion without reported diagnosis   . Diabetes mellitus without complication (HCC)   . History of ETOH abuse   .  Hypertension     Past Surgical History:  Procedure Laterality Date  . BACK SURGERY N/A 2010    Family History  Problem Relation Age of Onset  . Diabetes    . Hypertension    .  Arthritis Father   . Diabetes Father     Social History:  reports that he has quit smoking. He quit smokeless tobacco use about 22 years ago. He reports that he does not drink alcohol or use drugs.    Review of Systems         Lipids:  He has been on Lipitor for hypercholesterolemia since about 2011 good control       Lab Results  Component Value Date   CHOL 105 08/18/2016   HDL 48.10 08/18/2016   LDLCALC 35 08/18/2016   LDLDIRECT 40.0 02/13/2016   TRIG 110.0 08/18/2016   CHOLHDL 2 08/18/2016       The blood pressure has been treated with losartan 50 mg, prescribed by PCP   Last foot exam was in 1/17      Occasionally takes meloxicam for joint pain  Physical Examination:  Pulse (!) 52   Wt 208 lb (94.3 kg)   SpO2 96%   BMI 36.85 kg/m      ASSESSMENT:  Diabetes type 2, uncontrolled with obesity and BMI of 36 See history of present illness for detailed discussion of his current management, blood sugar patterns and problems identified His blood sugars have been Inadequately controlled with his variable compliance with instructions for his management As discussed above he ran out of Toujeo and this affected his control However still having higher fasting readings Postprandial readings are inadequate also Previously had done better with Trulicity and this may help him with dietary compliance and some weight loss   PLAN:   Increase TOUJEO up to 40 units  He was scheduled to start the V-go pump.  He will start with 30 units  BASAL and use maximum doses of boluses for now  Restart TRULICITY 0.75 mg weekly and consider increasing this to 1.5 on the next visit  He will need to review his meal planning with nurse educator also  Take insulin with him when he is going out for evening  meal and advised him that he can keep insulin at room temperature  V-go pump instruction to be scheduled and he will start with the nurse educator, bolus 12 units per meal  Counseling time on subjects discussed above is over 50% of today's 25 minute visit   Patient Instructions  Toujeo 40 units daily  Take Humalog even when eating out  Check blood sugars on waking up  4x per weekly  Also check blood sugars about 2 hours after a meal and do this after different meals by rotation  Recommended blood sugar levels on waking up is 90-130 and about 2 hours after meal is 130-160  Please bring your blood sugar monitor to each visit, thank you  Restart Trulicity  Counseling time on subjects discussed above is over 50% of today's 25 minute visit   Jackee Glasner 09/17/2016, 7:59 AM   Note: This office note was prepared with Dragon voice recognition system technology. Any transcriptional errors that result from this process are unintentional.

## 2016-09-16 NOTE — Patient Instructions (Addendum)
Toujeo 40 units daily  Take Humalog even when eating out  Check blood sugars on waking up  4x per weekly  Also check blood sugars about 2 hours after a meal and do this after different meals by rotation  Recommended blood sugar levels on waking up is 90-130 and about 2 hours after meal is 130-160  Please bring your blood sugar monitor to each visit, thank you  Restart Trulicity

## 2016-09-25 ENCOUNTER — Other Ambulatory Visit: Payer: Self-pay | Admitting: Medical

## 2016-09-28 ENCOUNTER — Other Ambulatory Visit: Payer: Self-pay | Admitting: Medical

## 2016-09-28 NOTE — Telephone Encounter (Signed)
Caller name: Natalia LeatherwoodKatherine Relationship to patient: Daughter Can be reached: [phone]  Pharmacy:  [facility] [place] - [address] [phone] (Phone) [phone] (Fax)     Reason for call: Request refills on losartan (COZAAR) 50 MG tablet [[id number]  atorvastatin (LIPITOR) 40 MG tablet [[id number]

## 2016-09-29 MED ORDER — LOSARTAN POTASSIUM 50 MG PO TABS: ORAL_TABLET | ORAL | 1 refills | Status: DC

## 2016-09-29 MED ORDER — ATORVASTATIN CALCIUM 40 MG PO TABS: ORAL_TABLET | ORAL | 5 refills | Status: DC

## 2016-09-29 NOTE — Telephone Encounter (Signed)
[facility]. Cancelled [facility] and [facility]. Left message on Katherine's voicemail re: Rx completion and to call back and verify if below pharmacy is new pharmacy or just for this refill?

## 2016-10-05 ENCOUNTER — Encounter: Payer: Medicare Other | Attending: Endocrinology | Admitting: Nutrition

## 2016-10-05 DIAGNOSIS — Z713 Dietary counseling and surveillance: Secondary | ICD-10-CM | POA: Diagnosis not present

## 2016-10-05 DIAGNOSIS — Z794 Long term (current) use of insulin: Secondary | ICD-10-CM | POA: Insufficient documentation

## 2016-10-05 DIAGNOSIS — E1165 Type 2 diabetes mellitus with hyperglycemia: Secondary | ICD-10-CM | POA: Insufficient documentation

## 2016-10-05 DIAGNOSIS — E118 Type 2 diabetes mellitus with unspecified complications: Secondary | ICD-10-CM

## 2016-10-05 NOTE — Patient Instructions (Signed)
Fill and apply a new V-Go every morning at the same time each day. Give 6 button presses 5-10 min. Before each meal. Stop all the Toujeo and Humalog in the pens Continue the once a week injection

## 2016-10-05 NOTE — Progress Notes (Signed)
Mr Samuel [name] with the help of the interpreter(gracieta Namilira), was shown how to fill, apply, and use the  V-go 30.  He took his Toujeo this AM, so he was not able to start this today.  He filled a training V-go with saline, and a V-go 30 with humalog to start tomorrow, with no problems.  He also re demonstrated how to do the 6 button presses before each meal.  He reported good understanding of the need to fill and apply this V-go once a day, and to stop all other insulin starting tomorrow.   He was also instructed to test his blood sugars before meals and at bedtime, until he returns to Dr. Lucianne MussKumar on Friday.  He reported good understanding of this.  He did not want a sheet to record the readings, and said he would bring his meter when he returns on Friday He had no final questions.

## 2016-10-08 ENCOUNTER — Encounter: Payer: Self-pay | Admitting: Endocrinology

## 2016-10-08 ENCOUNTER — Ambulatory Visit (INDEPENDENT_AMBULATORY_CARE_PROVIDER_SITE_OTHER): Payer: Medicare Other | Admitting: Endocrinology

## 2016-10-08 VITALS — BP 138/60 | HR 70 | Ht 63.0 in | Wt 207.0 lb

## 2016-10-08 DIAGNOSIS — Z794 Long term (current) use of insulin: Secondary | ICD-10-CM | POA: Diagnosis not present

## 2016-10-08 DIAGNOSIS — E1165 Type 2 diabetes mellitus with hyperglycemia: Secondary | ICD-10-CM

## 2016-10-08 NOTE — Progress Notes (Signed)
Patient ID: Samuel [name], male   DOB: [birth year], 65 y.o.   MRN: [id number]            Reason for Appointment:  Followup for Type 2 Diabetes  Referring physician: Saguier  History of Present Illness:          Diagnosis: Type 2 diabetes mellitus, date of diagnosis:  1990      Past history: He thinks he has been on insulin for the last 10-12 years. Previously had been on metformin which has been continued. He was probably tried on different insulin regimens initially but has been taking 70/30 mostly because of cost Previous records are not available and appears that his sugars have been poorly controlled for several years He was  referred here because of an A1c in 8/15 of 9.7%. He was on a regimen of Novolin 70/30 twice a day but because of inadequate control and higher readings later in the day he was switched to basal bolus insulin regimen in 12/15.  Recent history:   INSULIN regimen is described as:  V-go 30 unit pump with boluses 12 units at meals Non-insulin hypoglycemic drugs: Trulicity 0.75 mg weekly, metformin 1 g twice a day     He is here for follow-up after starting the V-go pump this week Previously A1c has been generally over 8-9% and was 8% recently  Current management, blood sugar patterns and problems identified:  He started taking the insulin with the V-go pump on 11/29  With this his blood sugars are ranging between 86-141 with the exception of a blood sugar 191 after supper last night  Fasting glucose readings: Yesterday 137 and today 123  He has had no difficulty filling the pump or doing the boluses  Overall he has subjectively liked using the pump compared to the injections  Yesterday has his sugar was 106 at lunch he did not take any boluses but did not check his blood sugar after eating  Blood sugar was down to 86 at suppertime yesterday, appears that he did not have any protein with his lunch yesterday  Previously his highest blood sugars were either  around lunchtime or late evening.  He also started taking the Trulicity and with this he thinks he is able to control portions better        Side effects from medications have been: ?  Nausea/dizziness on Victoza  Compliance with the medical regimen: Poor  Hypoglycemia:   rarely, has only one low sugar at 1 AM  Glucose monitoring:  done mostly once a day         Glucometer:  One Touch ultra 2     Blood Glucose readings by time of day by review of meter download as above Overall median for the last 4 weeks is 176   Self-care: The diet that the patient has been following is: Not consistently controlling portions and may have unbalanced carbohydrate meals especially breakfast    Meals: 3 meals per day. Breakfast at 8-9 am is various kinds of bread, milk and sometimes eggs.  Dinner at 6 pm    Dietician visit, most recent: 5/16 Last CDE visit: 3/16                 Exercise: Walking about 1 mile,At least 3 days a week  Weight history: Previous range 200-220  Wt Readings from Last 3 Encounters:  10/08/16 207 lb (93.9 kg)  09/16/16 208 lb (94.3 kg)  08/17/16 204 lb 9.6 oz (92.8 kg)  Glycemic control:   Lab Results  Component Value Date   HGBA1C 8.0 09/16/2016   HGBA1C 8.1 04/29/2016   HGBA1C 7.7 01/26/2016   Lab Results  Component Value Date   MICROALBUR 1.1 04/29/2016   LDLCALC 35 08/18/2016   CREATININE 0.74 08/17/2016         Medication List       Accurate as of 10/08/16 12:55 PM. Always use your most recent med list.          aspirin 325 MG tablet Take 81 mg by mouth daily.   atorvastatin 40 MG tablet Commonly known as:  LIPITOR TAKE 1 TABLET BY MOUTH DAILY AND 1/2 TABLET AT BEDTIME   B-D UF III MINI PEN NEEDLES 31G X 5 MM Misc Generic drug:  Insulin Pen Needle USE FIVE PER DAY AS DIRECTED   B-D UF III MINI PEN NEEDLES 31G X 5 MM Misc Generic drug:  Insulin Pen Needle USE FIVE PER DAY AS DIRECTED   Insulin Pen Needle 31G X 5 MM Misc Commonly known  as:  B-D UF III MINI PEN NEEDLES USE FIVE PER DAY AS DIRECTED   cholecalciferol 1000 units tablet Commonly known as:  VITAMIN D Take 1,000 Units by mouth daily. Reported on 11/05/2015   Choline Fenofibrate 45 MG capsule Take 1 capsule (45 mg total) by mouth daily.   Dulaglutide 0.75 MG/0.5ML Sopn Commonly known as:  TRULICITY Inject 0.75 mg into the skin once a week.   HUMALOG KWIKPEN 100 UNIT/ML KiwkPen Generic drug:  insulin lispro INJECT 35 UNITS UNDER THE SKIN BEFORE MEALS   Insulin Glargine 300 UNIT/ML Sopn Commonly known as:  TOUJEO SOLOSTAR Inject 35 Units into the skin every morning.   losartan 50 MG tablet Commonly known as:  COZAAR TAKE 1 TABLET(50 MG) BY MOUTH DAILY   meloxicam 7.5 MG tablet Commonly known as:  MOBIC TAKE 1 TO 2 TABLETS BY MOUTH EVERY DAY   metFORMIN 1000 MG tablet Commonly known as:  GLUCOPHAGE TAKE 1 TABLET BY MOUTH TWICE DAILY WITH MEALS   ONE TOUCH ULTRA TEST test strip Generic drug:  glucose blood TEST THREE TIMES DAILY   tamsulosin 0.4 MG Caps capsule Commonly known as:  FLOMAX Take 1 capsule (0.4 mg total) by mouth daily.       Allergies: No Known Allergies  Past Medical History:  Diagnosis Date  . Blood transfusion without reported diagnosis   . Diabetes mellitus without complication (HCC)   . History of ETOH abuse   . Hypertension     Past Surgical History:  Procedure Laterality Date  . BACK SURGERY N/A 2010    Family History  Problem Relation Age of Onset  . Diabetes    . Hypertension    . Arthritis Father   . Diabetes Father     Social History:  reports that he has quit smoking. He quit smokeless tobacco use about 22 years ago. He reports that he does not drink alcohol or use drugs.    Review of Systems         Lipids:  He has been on Lipitor 40 mg for hypercholesterolemia since about 2011 good control PCP has also prescribed fenofibrate 45 mg       Lab Results  Component Value Date   CHOL 105  08/18/2016   HDL 48.10 08/18/2016   LDLCALC 35 08/18/2016   LDLDIRECT 40.0 02/13/2016   TRIG 110.0 08/18/2016   CHOLHDL 2 08/18/2016       The blood  pressure has been treated with losartan 50 mg, prescribed by PCP   Last foot exam was in 1/17       Physical Examination:  BP 138/60   Pulse 70   Ht 5\' 3"  (1.6 m)   Wt 207 lb (93.9 kg)   BMI 36.67 kg/m      ASSESSMENT:  Diabetes type 2, uncontrolled with obesity and BMI of 36 He is here for short-term follow-up after starting the V-go pump His blood sugars are excellent with using the 30 unit basal and 12 units mealtime coverage Previously was taking 40 units basal with Toujeo and 35 units at meals Also may be benefiting from starting Trulicity  However he did not take a bolus for lunch yesterday for fear of hypoglycemia but not clear if his postprandial reading was higher    PLAN:   Discussed types of protein to add with each meal including lunch  He can reduce his bolus for breakfast down to 10 units and continue 12 units at dinner.  For now he can take 4 units at lunchtime but needs to start checking readings 2 hours later  Continue Trulicity and consider increasing the dose especially if fasting readings are higher  Patient Instructions  Click 5 times at Bfst, 2 times at lunch and 6 at dinner  Check blood sugars on waking up  4-5x daily  Also check blood sugars about 2 hours after a meal and do this after different meals by rotation  Recommended blood sugar levels on waking up is 90-130 and about 2 hours after meal is 130-160  Please bring your blood sugar monitor to each visit, thank you     [facility],Cortny Bambach 10/08/2016, 12:55 PM   Note: This office note was prepared with Dragon voice recognition system technology. Any transcriptional errors that result from this process are unintentional.

## 2016-10-08 NOTE — Patient Instructions (Addendum)
Click 5 times at Bfst, 2 times at lunch and 6 at dinner  Check blood sugars on waking up  4-5x daily  Also check blood sugars about 2 hours after a meal and do this after different meals by rotation  Recommended blood sugar levels on waking up is 90-130 and about 2 hours after meal is 130-160  Please bring your blood sugar monitor to each visit, thank you

## 2016-10-12 ENCOUNTER — Other Ambulatory Visit: Payer: Self-pay | Admitting: Endocrinology

## 2016-10-25 ENCOUNTER — Telehealth: Payer: Self-pay | Admitting: Endocrinology

## 2016-10-25 NOTE — Telephone Encounter (Signed)
V go customer care is calling to let us know the rx for the vgo needs [facility] [phone]

## 2016-10-27 MED ORDER — V-GO 30 KIT
PACK | 2 refills | Status: DC
Start: 2016-10-27 — End: 2016-12-09

## 2016-10-27 NOTE — Telephone Encounter (Signed)
Pharmacy still have not received medication. Please advise.

## 2016-10-27 NOTE — Telephone Encounter (Signed)
I contacted the patient and advised V-go 30 has been submitted.

## 2016-10-27 NOTE — Telephone Encounter (Signed)
V-go 30 unit pump, this is always recorded in my notes

## 2016-10-27 NOTE — Telephone Encounter (Signed)
See message. What V-Go should be submitted?

## 2016-11-15 ENCOUNTER — Other Ambulatory Visit: Payer: Self-pay | Admitting: Endocrinology

## 2016-12-06 ENCOUNTER — Other Ambulatory Visit (INDEPENDENT_AMBULATORY_CARE_PROVIDER_SITE_OTHER): Payer: Medicare Other

## 2016-12-06 DIAGNOSIS — Z794 Long term (current) use of insulin: Secondary | ICD-10-CM

## 2016-12-06 DIAGNOSIS — E1165 Type 2 diabetes mellitus with hyperglycemia: Secondary | ICD-10-CM

## 2016-12-06 LAB — COMPREHENSIVE METABOLIC PANEL
ALBUMIN: 4.2 g/dL (ref 3.5–5.2)
ALK PHOS: 40 U/L (ref 39–117)
ALT: 28 U/L (ref 0–53)
AST: 23 U/L (ref 0–37)
BUN: 20 mg/dL (ref 6–23)
CO2: 28 mEq/L (ref 19–32)
CREATININE: 0.81 mg/dL (ref 0.40–1.50)
Calcium: 9.1 mg/dL (ref 8.4–10.5)
Chloride: 104 mEq/L (ref 96–112)
GFR: 101.51 mL/min (ref >60.00)
GLUCOSE: 197 mg/dL — AB (ref 70–99)
POTASSIUM: 4.3 meq/L (ref 3.5–5.1)
SODIUM: 139 meq/L (ref 135–145)
TOTAL PROTEIN: 6.6 g/dL (ref 6.0–8.3)
Total Bilirubin: 0.5 mg/dL (ref 0.2–1.2)

## 2016-12-09 ENCOUNTER — Encounter: Payer: Self-pay | Admitting: Endocrinology

## 2016-12-09 ENCOUNTER — Ambulatory Visit (INDEPENDENT_AMBULATORY_CARE_PROVIDER_SITE_OTHER): Payer: Medicare Other | Admitting: Endocrinology

## 2016-12-09 ENCOUNTER — Other Ambulatory Visit: Payer: Self-pay

## 2016-12-09 VITALS — BP 138/70 | HR 63 | Ht 63.0 in | Wt 213.0 lb

## 2016-12-09 DIAGNOSIS — Z794 Long term (current) use of insulin: Secondary | ICD-10-CM | POA: Diagnosis not present

## 2016-12-09 DIAGNOSIS — E119 Type 2 diabetes mellitus without complications: Secondary | ICD-10-CM

## 2016-12-09 DIAGNOSIS — E1165 Type 2 diabetes mellitus with hyperglycemia: Secondary | ICD-10-CM | POA: Diagnosis not present

## 2016-12-09 MED ORDER — DULAGLUTIDE 0.75 MG/0.5ML ~~LOC~~ SOAJ: 0.7500 mg | SUBCUTANEOUS | 3 refills | Status: DC

## 2016-12-09 MED ORDER — V-GO 40 KIT: PACK | 3 refills | Status: DC

## 2016-12-09 NOTE — Progress Notes (Signed)
Patient ID: Samuel [name], male   DOB: [birth year], 66 y.o.   MRN: [id number]            Reason for Appointment:  Followup for Type 2 Diabetes  Referring physician: Saguier  History of Present Illness:          Diagnosis: Type 2 diabetes mellitus, date of diagnosis:  1990      Past history: He thinks he has been on insulin for the last 10-12 years. Previously had been on metformin which has been continued. He was probably tried on different insulin regimens initially but has been taking 70/30 mostly because of cost Previous records are not available and appears that his sugars have been poorly controlled for several years He was  referred here because of an A1c in 8/15 of 9.7%. He was on a regimen of Novolin 70/30 twice a day but because of inadequate control and higher readings later in the day he was switched to basal bolus insulin regimen in 12/15.  Recent history:   INSULIN regimen is described as:  Toujeo 40 units daily, Humalog 40 units before meals Previously on V-go 30 unit pump with boluses 12 units at meals  Non-insulin hypoglycemic drugs: metformin 1 g twice a day      Previously A1c has been generally over 8-9% and was 8% last  Current management, blood sugar patterns and problems identified:  He now says that he stopped using the V-go pump after his last visit because he thinks his sugars were higher  He thinks his blood sugars were around 160 in the morning and sometimes higher later in the day  However he did not report this and is now back on Toujeo and Humalog but at higher doses than before  Although he was told to start Trulicity he did not do that last time  With the V-go pump his blood sugars were excellent in the first few days with readings as low as 86 and fasting down to 123  His FASTING blood sugars now averaging about 200 and afternoon readings are higher  Not checking readings after meals much   Appears to have gained a significant amount of  weight also        Side effects from medications have been: ?  Nausea/dizziness on Victoza  Compliance with the medical regimen: Poor  Hypoglycemia:   rarely, has only one low sugar at 1 AM  Glucose monitoring:  done mostly once a day         Glucometer:  One Touch ultra 2     Blood Glucose readings by time of day by review of meter download   Mean values apply above for all meters except median for One Touch  PRE-MEAL Fasting Lunch Dinner Bedtime Overall  Glucose range: 120-285   157-383  158-398    Mean/median: 200 214 223  206    Self-care: The diet that the patient has been following is: None Not consistently controlling portions and may have unbalanced carbohydrate meals  Meals: 3 meals per day. Breakfast at 8-9 am is various kinds of bread, milk and sometimes eggs.  Dinner at 6 pm    Dietician visit, most recent: 5/16 Last CDE visit: 3/16                 Exercise: Walking less recently because of cold weather  Weight history: Previous range 200-220  Wt Readings from Last 3 Encounters:  12/09/16 213 lb (96.6 kg)  10/08/16 207 lb (93.9  kg)  09/16/16 208 lb (94.3 kg)    Glycemic control:   Lab Results  Component Value Date   HGBA1C 8.0 09/16/2016   HGBA1C 8.1 04/29/2016   HGBA1C 7.7 01/26/2016   Lab Results  Component Value Date   MICROALBUR 1.1 04/29/2016   LDLCALC 35 08/18/2016   CREATININE 0.81 12/06/2016    No results found for: FRUCTOSAMINE     Allergies as of 12/09/2016   No Known Allergies     Medication List       Accurate as of 12/09/16  8:42 AM. Always use your most recent med list.          aspirin 325 MG tablet Take 81 mg by mouth daily.   atorvastatin 40 MG tablet Commonly known as:  LIPITOR TAKE 1 TABLET BY MOUTH DAILY AND 1/2 TABLET AT BEDTIME   B-D UF III MINI PEN NEEDLES 31G X 5 MM Misc Generic drug:  Insulin Pen Needle USE FIVE PER DAY AS DIRECTED   B-D UF III MINI PEN NEEDLES 31G X 5 MM Misc Generic drug:  Insulin Pen  Needle USE FIVE PER DAY AS DIRECTED   Insulin Pen Needle 31G X 5 MM Misc Commonly known as:  B-D UF III MINI PEN NEEDLES USE FIVE PER DAY AS DIRECTED   cholecalciferol 1000 units tablet Commonly known as:  VITAMIN D Take 1,000 Units by mouth daily. Reported on 11/05/2015   Choline Fenofibrate 45 MG capsule Take 1 capsule (45 mg total) by mouth daily.   Dulaglutide 0.75 MG/0.5ML Sopn Commonly known as:  TRULICITY Inject 4.03 mg into the skin once a week.   HUMALOG KWIKPEN 100 UNIT/ML KiwkPen Generic drug:  insulin lispro INJECT 35 UNITS UNDER THE SKIN BEFORE MEALS   Insulin Glargine 300 UNIT/ML Sopn Commonly known as:  TOUJEO SOLOSTAR Inject 35 Units into the skin every morning.   TOUJEO SOLOSTAR 300 UNIT/ML Sopn Generic drug:  Insulin Glargine INJECT 35 UNITS UNDER THE SKIN EVERY MORNING   losartan 50 MG tablet Commonly known as:  COZAAR TAKE 1 TABLET(50 MG) BY MOUTH DAILY   meloxicam 7.5 MG tablet Commonly known as:  MOBIC TAKE 1 TO 2 TABLETS BY MOUTH EVERY DAY   metFORMIN 1000 MG tablet Commonly known as:  GLUCOPHAGE TAKE 1 TABLET BY MOUTH TWICE DAILY WITH MEALS   ONE TOUCH ULTRA TEST test strip Generic drug:  glucose blood TEST THREE TIMES DAILY   tamsulosin 0.4 MG Caps capsule Commonly known as:  FLOMAX Take 1 capsule (0.4 mg total) by mouth daily.   V-GO 30 Kit Use 1 device per day.       Allergies: No Known Allergies  Past Medical History:  Diagnosis Date  . Blood transfusion without reported diagnosis   . Diabetes mellitus without complication ([place])   . History of ETOH abuse   . Hypertension     Past Surgical History:  Procedure Laterality Date  . BACK SURGERY N/A 2010    Family History  Problem Relation Age of Onset  . Diabetes    . Hypertension    . Arthritis Father   . Diabetes Father     Social History:  reports that he has quit smoking. He quit smokeless tobacco use about 23 years ago. He reports that he does not drink alcohol  or use drugs.    Review of Systems         Lipids:  He has been on Lipitor 40 mg for hypercholesterolemia since about 2011 good  control Also on low-dose fenofibrate from PCP       Lab Results  Component Value Date   CHOL 105 08/18/2016   HDL 48.10 08/18/2016   LDLCALC 35 08/18/2016   LDLDIRECT 40.0 02/13/2016   TRIG 110.0 08/18/2016   CHOLHDL 2 08/18/2016       The blood pressure has been treated with losartan 50 mg, prescribed by PCP   Last foot exam was in 1/17       Physical Examination:  BP 138/70   Pulse 63   Ht _0  (1.6 m)   Wt 213 lb (96.6 kg)   SpO2 95%   BMI 37.73 kg/m      ASSESSMENT:  Diabetes type 2, uncontrolled with obesity and BMI of 36  See history of present illness for detailed discussion of current diabetes management, blood sugar patterns and problems identified His blood sugars are now poorly controlled Although he reports high readings after starting the V-go pump made is likely that readings are going up because of his not continuing Trulicity as directed Now even with taking 160 units of insulin a day his blood sugars are averaging over 200 and he is gaining weight Not doing any exercise Still does not understand the need to take Trulicity with insulin   HYPERTENSION: Appears well-controlled  PLAN:   He will be given a trial of the 40 units V-go pump, samples given today also  He will need to potentially use extra 10 units of Humalog at suppertime if readings are higher after supper consistently which he is not monitoring as much  For now he can start back on Trulicity 6.43 mg weekly  He can go to an indoor shopping area to try and walk regularly for exercise  Emphasized the need to lose weight  Consider consultation with dietitian again  Patient education and instructions  relayed through the interpreter today  Patient Instructions  Restart Trulicity weekly  Counseling time on subjects discussed above is over 50% of  today's 25 minute visit   [place] 12/09/2016, 8:42 AM   Note: This office note was prepared with Dragon voice recognition system technology. Any transcriptional errors that result from this process are unintentional.

## 2016-12-09 NOTE — Patient Instructions (Addendum)
Restart Trulicity weekly  Try 40 unit pump with 6 clicks at each meal  May add 10 Units Humalog to clicks with main meal

## 2016-12-27 ENCOUNTER — Telehealth: Payer: Self-pay | Admitting: Endocrinology

## 2016-12-27 ENCOUNTER — Other Ambulatory Visit: Payer: Self-pay

## 2016-12-27 MED ORDER — INSULIN LISPRO 100 UNIT/ML (KWIKPEN): PEN_INJECTOR | SUBCUTANEOUS | 4 refills | Status: DC

## 2016-12-27 NOTE — Telephone Encounter (Signed)
[facility] needs the rx for humalog to be called please see the possible increased dosing from the last note

## 2016-12-27 NOTE — Telephone Encounter (Signed)
Ordered

## 2017-01-04 ENCOUNTER — Other Ambulatory Visit: Payer: Self-pay

## 2017-01-04 ENCOUNTER — Telehealth: Payer: Self-pay | Admitting: Nutrition

## 2017-01-04 ENCOUNTER — Encounter: Payer: Medicare Other | Attending: Endocrinology | Admitting: Nutrition

## 2017-01-04 ENCOUNTER — Other Ambulatory Visit: Payer: Self-pay | Admitting: Endocrinology

## 2017-01-04 ENCOUNTER — Ambulatory Visit (INDEPENDENT_AMBULATORY_CARE_PROVIDER_SITE_OTHER): Payer: Medicare Other | Admitting: Endocrinology

## 2017-01-04 DIAGNOSIS — Z794 Long term (current) use of insulin: Secondary | ICD-10-CM | POA: Insufficient documentation

## 2017-01-04 DIAGNOSIS — E119 Type 2 diabetes mellitus without complications: Secondary | ICD-10-CM

## 2017-01-04 DIAGNOSIS — E1165 Type 2 diabetes mellitus with hyperglycemia: Secondary | ICD-10-CM | POA: Diagnosis not present

## 2017-01-04 DIAGNOSIS — Z713 Dietary counseling and surveillance: Secondary | ICD-10-CM | POA: Insufficient documentation

## 2017-01-04 DIAGNOSIS — Z23 Encounter for immunization: Secondary | ICD-10-CM | POA: Diagnosis not present

## 2017-01-04 NOTE — Patient Instructions (Signed)
Fill and apply a new V-go 40 every day.  Take 4-6 button presses before each meal.  If needing more than 18 button presses, take extra Huimalog via the pen.

## 2017-01-04 NOTE — Progress Notes (Signed)
Mr Samuel [name] has stopped using the V-Go 40s because he ran out of Humalog insulin in the Vials.  He says he called and asked that it be ordered last week, but nothing was called into the pharmacy.  He was some pen needles, and a sample of V-Go 40s and a message was sent to [employer] to order this.  He was using the Humalog pens that he had at home, and was taking some insulin before each meal.  FBSs via One Touch meter was upper 100s-low 200s, 100acL and 140-190 acS and higher at HS.  He was taking no long acting insulin. He will start the V-Go tonight when he gets home. He reports no difficulty using the V-Go and reported that his blood sugars were better on the 40  one.  We reviewed the possible need to take extra Humalog via a pen at HS if he runs out of the V-go insulin for meal time coverage.  He reported good understanding of this and had no final questions.

## 2017-01-04 NOTE — Progress Notes (Signed)
Nurse visit for influenza vaccine only 

## 2017-01-06 ENCOUNTER — Encounter: Payer: Self-pay | Admitting: Endocrinology

## 2017-01-06 ENCOUNTER — Ambulatory Visit (INDEPENDENT_AMBULATORY_CARE_PROVIDER_SITE_OTHER): Payer: Medicare Other | Admitting: Endocrinology

## 2017-01-06 VITALS — BP 128/64 | HR 77 | Wt 211.0 lb

## 2017-01-06 DIAGNOSIS — E1165 Type 2 diabetes mellitus with hyperglycemia: Secondary | ICD-10-CM

## 2017-01-06 DIAGNOSIS — I10 Essential (primary) hypertension: Secondary | ICD-10-CM

## 2017-01-06 DIAGNOSIS — Z794 Long term (current) use of insulin: Secondary | ICD-10-CM

## 2017-01-06 MED ORDER — DULAGLUTIDE 1.5 MG/0.5ML ~~LOC~~ SOAJ: SUBCUTANEOUS | 5 refills | Status: DC

## 2017-01-06 NOTE — Patient Instructions (Signed)
TRULICITY: Take 2 injections of the 0.75 dosage weekly until finished and then go to the 1.5 mg weekly dose  For your insulin pump take the following number of clicks 5 at breakfast, 7 at lunch and 5 at dinner  TAKE HUMALOG by injection 12 units before dinner daily also If the sugars are still high after dinner, that is over 180 then increase the dose of Humalog injection to 16 units  Change the V-go pump at the same time daily   TRULICIDAD: tome 2 inyecciones de la dosis de 0.75 semanalmente hasta que finalice y luego vaya a la dosis semanal de 1.5 mg  Para su bomba de insulina, tome la siguiente cantidad de clics 5 en el desayuno, 7 en el almuerzo y 5 en la cena  TOME HUMALOG por inyeccin 12 unidades antes de la cena diariamente tambin Si los azcares siguen altos despus de la cena, es decir ms de 180, entonces aumente la dosis de la inyeccin de Humalog a 16 unidades  Cambie la bomba V-go Biomedical engineerpero a la misma hora [employer] los das

## 2017-01-06 NOTE — Progress Notes (Signed)
Patient ID: Samuel [name], male   DOB: Apr 06, 1951, 66 y.o.   MRN: [id number]            Reason for Appointment:  Followup for Type 2 Diabetes  Referring physician: Saguier  History of Present Illness:          Diagnosis: Type 2 diabetes mellitus, date of diagnosis:  1990      Past history: He thinks he has been on insulin for the last 10-12 years. Previously had been on metformin which has been continued. He was probably tried on different insulin regimens initially but has been taking 70/30 mostly because of cost Previous records are not available and appears that his sugars have been poorly controlled for several years He was  referred here because of an A1c in 8/15 of 9.7%. He was on a regimen of Novolin 70/30 twice a day but because of inadequate control and higher readings later in the day he was switched to basal bolus insulin regimen in 12/15.  Recent history:   INSULIN regimen is described as:  V-go basal 40 units daily with boluses 12 units at meals  Non-insulin hypoglycemic drugs: metformin 1 g twice a day, Trulicity 7.51 mg weekly     Previously A1c has been generally over 8-9% and was 8% last  Current management, blood sugar patterns and problems identified:  He now has been on the V-go pump 40 units since his last visit but until this week he ran out of Humalog insulin for 2 weeks and has been inconsistent with this  However his blood sugars are overall lower with the V-go pump and he is needing less insulin  Previously taking as much as 40 units Humalog with mealtimes and now with his boluses of 12 units his blood sugars are only mildly increased especially after breakfast  His readings are still averaging over 200 after supper but he will then take extra injectable insulin after the meals to bring it down   Although he has been starting Trulicity he does not think it has changed his appetite significantly  However his weight is starting to come down now  Blood  sugars are mostly higher before supper also        Side effects from medications have been: ?  Nausea/dizziness on Victoza  Compliance with the medical regimen: Fair  Hypoglycemia:   rarely, has only one low sugar at 1 AM  Glucose monitoring:  done 2-3 times a day         Glucometer:  One Touch ultra 2     Blood Glucose readings by time of day by review of meter download   Mean values apply above for all meters except median for One Touch  PRE-MEAL Fasting Lunch Dinner Bedtime Overall  Glucose range: 103-252   83-295  118-324    Mean/median: 179  146  186  220  188     Self-care:  Not consistently controlling portions and may have unbalanced carbohydrate meals  Meals: 3 meals per day. Breakfast at 8-9 am is various kinds of bread, milk and sometimes eggs.  Dinner at 6 pm    Dietician visit, most recent: 5/16 Last CDE visit: 12/2016                 Exercise: Walking some recently  Weight history: Previous range 200-220  Wt Readings from Last 3 Encounters:  01/06/17 211 lb (95.7 kg)  12/09/16 213 lb (96.6 kg)  10/08/16 207 lb (93.9 kg)  Glycemic control:   Lab Results  Component Value Date   HGBA1C 8.0 09/16/2016   HGBA1C 8.1 04/29/2016   HGBA1C 7.7 01/26/2016   Lab Results  Component Value Date   MICROALBUR 1.1 04/29/2016   LDLCALC 35 08/18/2016   CREATININE 0.81 12/06/2016    No results found for: FRUCTOSAMINE     Allergies as of 01/06/2017   No Known Allergies     Medication List       Accurate as of 01/06/17  9:28 PM. Always use your most recent med list.          aspirin 325 MG tablet Take 81 mg by mouth daily.   atorvastatin 40 MG tablet Commonly known as:  LIPITOR TAKE 1 TABLET BY MOUTH DAILY AND 1/2 TABLET AT BEDTIME   B-D UF III MINI PEN NEEDLES 31G X 5 MM Misc Generic drug:  Insulin Pen Needle USE FIVE PER DAY AS DIRECTED   B-D UF III MINI PEN NEEDLES 31G X 5 MM Misc Generic drug:  Insulin Pen Needle USE FIVE PER DAY AS DIRECTED     Insulin Pen Needle 31G X 5 MM Misc Commonly known as:  B-D UF III MINI PEN NEEDLES USE FIVE PER DAY AS DIRECTED   cholecalciferol 1000 units tablet Commonly known as:  VITAMIN D Take 1,000 Units by mouth daily. Reported on 11/05/2015   Choline Fenofibrate 45 MG capsule Take 1 capsule (45 mg total) by mouth daily.   Dulaglutide 1.5 MG/0.5ML Sopn Commonly known as:  TRULICITY Inject weekly   insulin lispro 100 UNIT/ML KiwkPen Commonly known as:  HUMALOG KWIKPEN INJECT 35 UNITS UNDER THE SKIN BEFORE MEALS- ADD 10 UNITS AFTER SUPPER IF SUGAR CONTINUES TO BE HIGH   losartan 50 MG tablet Commonly known as:  COZAAR TAKE 1 TABLET(50 MG) BY MOUTH DAILY   meloxicam 7.5 MG tablet Commonly known as:  MOBIC TAKE 1 TO 2 TABLETS BY MOUTH EVERY DAY   metFORMIN 1000 MG tablet Commonly known as:  GLUCOPHAGE TAKE 1 TABLET BY MOUTH TWICE DAILY WITH MEALS   ONE TOUCH ULTRA TEST test strip Generic drug:  glucose blood TEST THREE TIMES DAILY   tamsulosin 0.4 MG Caps capsule Commonly known as:  FLOMAX Take 1 capsule (0.4 mg total) by mouth daily.   V-GO 40 Kit Use 1 device per day with insulin       Allergies: No Known Allergies  Past Medical History:  Diagnosis Date  . Blood transfusion without reported diagnosis   . Diabetes mellitus without complication ([place])   . History of ETOH abuse   . Hypertension     Past Surgical History:  Procedure Laterality Date  . BACK SURGERY N/A 2010    Family History  Problem Relation Age of Onset  . Diabetes    . Hypertension    . Arthritis Father   . Diabetes Father     Social History:  reports that he has quit smoking. He quit smokeless tobacco use about 23 years ago. He reports that he does not drink alcohol or use drugs.    Review of Systems         Lipids:  He has been on Lipitor 40 mg for hypercholesterolemia since about 2011 good control Also on low-dose fenofibrate from PCP       Lab Results  Component Value Date    CHOL 105 08/18/2016   HDL 48.10 08/18/2016   LDLCALC 35 08/18/2016   LDLDIRECT 40.0 02/13/2016   TRIG 110.0  08/18/2016   CHOLHDL 2 08/18/2016       The blood pressure has been treated with losartan 50 mg, prescribed by PCP   Last foot exam was in 1/17       Physical Examination:  BP 128/64   Pulse 77   Wt 211 lb (95.7 kg)   SpO2 96%   BMI 37.38 kg/m      ASSESSMENT:  Diabetes type 2, uncontrolled with obesity and BMI of 36  See history of present illness for detailed discussion of current diabetes management, blood sugar patterns and problems identified.[name]  His blood sugars are overall better with using the V-go pump and needing less insulin than with injectable insulin However because of his not getting his insulin while he could not use his pump for a couple of weeks However he still seems to have relatively higher fasting readings at times Most of his high readings are after his evening meal some: He is however taking extra insulin postprandially when the blood sugars are high with the pen  He thinks he is trying to be active with walking, however weight loss is difficult Blood sugars are generally better lunchtime when he is using the pump   PLAN:   He will continue the pump with the 40 units basal for now  However will need to add at least 12 units of Humalog at suppertime with the pen the supplement his bolus with the pump; if his blood sugars are still over 180 postprandially may need to increase the Humalog boluses  Reminded him to change his pump at the same time daily  Since he needs more insulin at lunch he will use 14 units at lunch and reduce morning dose to 10 units  Also discussed that he may do better with the higher dose of Trulicity which he is tolerating and can go to 1.5 mg  Reassured him that he wants to use up his 5.09 mg Trulicity can take 2 injections together without any problem, prescription also sent for the 1.5 mg dosage  Will check A1c on  the next visit.  Patient education and instructions  relayed through the interpreter today  Patient Instructions  TRULICITY: Take 2 injections of the 0.75 dosage weekly until finished and then go to the 1.5 mg weekly dose  For your insulin pump take the following number of clicks 5 at breakfast, 7 at lunch and 5 at dinner  TAKE HUMALOG by injection 12 units before dinner daily also If the sugars are still high after dinner, that is over 180 then increase the dose of Humalog injection to 16 units  Change the V-go pump at the same time daily   TRULICIDAD: tome 2 inyecciones de la dosis de 0.75 semanalmente hasta que finalice y luego vaya a la dosis semanal de 1.5 mg  Para su bomba de insulina, tome la siguiente cantidad de clics 5 en el desayuno, 7 en el almuerzo y 5 en la cena  TOME HUMALOG por inyeccin 12 unidades antes de la cena diariamente tambin Si los azcares siguen altos despus de la cena, es decir ms de 180, entonces aumente la dosis de la inyeccin de Humalog a 16 unidades  Cambie la bomba V-go pero a la misma hora [employer]   Counseling time on subjects discussed above is over 50% of today's 25 minute visit   Javarion Douty 01/06/2017, 9:28 PM   Note: This office note was prepared with Estate agent. Any transcriptional errors  that result from this process are unintentional.

## 2017-01-07 NOTE — Telephone Encounter (Signed)
Opened in error

## 2017-01-17 ENCOUNTER — Other Ambulatory Visit: Payer: Self-pay

## 2017-01-17 ENCOUNTER — Telehealth: Payer: Self-pay | Admitting: Endocrinology

## 2017-01-17 MED ORDER — INSULIN LISPRO 100 UNIT/ML ~~LOC~~ SOLN: SUBCUTANEOUS | 3 refills | Status: DC

## 2017-01-17 NOTE — Telephone Encounter (Signed)
[facility] Call: Caller states she is requesting a refill for her father's insulin. No symptoms, he uses Humalog for his insulin pump.  She didn't realize what time the office closes and he does have insulin pens that he can use over the weekend but he will need an Rx called in to his pharmacy on Monday for the vials to refill his insulin pump.

## 2017-01-17 NOTE — Telephone Encounter (Signed)
This has been ordered 

## 2017-01-18 ENCOUNTER — Other Ambulatory Visit: Payer: Self-pay

## 2017-01-18 MED ORDER — GLUCOSE BLOOD VI STRP: ORAL_STRIP | 1 refills | Status: DC

## 2017-01-18 MED ORDER — INSULIN LISPRO 100 UNIT/ML ~~LOC~~ SOLN: SUBCUTANEOUS | 3 refills | Status: DC

## 2017-01-25 ENCOUNTER — Other Ambulatory Visit: Payer: Self-pay

## 2017-01-25 MED ORDER — INSULIN PEN NEEDLE 32G X 4 MM MISC: 3 refills | Status: AC

## 2017-02-14 ENCOUNTER — Encounter: Payer: Self-pay | Admitting: Medical

## 2017-02-14 ENCOUNTER — Ambulatory Visit (INDEPENDENT_AMBULATORY_CARE_PROVIDER_SITE_OTHER): Payer: Medicare Other | Admitting: Medical

## 2017-02-14 ENCOUNTER — Telehealth: Payer: Self-pay | Admitting: Medical

## 2017-02-14 VITALS — BP 137/64 | HR 63 | Temp 98.3°F | Ht 63.0 in | Wt 212.8 lb

## 2017-02-14 DIAGNOSIS — Z794 Long term (current) use of insulin: Secondary | ICD-10-CM | POA: Diagnosis not present

## 2017-02-14 DIAGNOSIS — E785 Hyperlipidemia, unspecified: Secondary | ICD-10-CM

## 2017-02-14 DIAGNOSIS — E119 Type 2 diabetes mellitus without complications: Secondary | ICD-10-CM

## 2017-02-14 DIAGNOSIS — I10 Essential (primary) hypertension: Secondary | ICD-10-CM

## 2017-02-14 LAB — POC URINALSYSI DIPSTICK (AUTOMATED)
BILIRUBIN UA: NEGATIVE
Glucose, UA: NEGATIVE
Ketones, UA: NEGATIVE
Leukocytes, UA: NEGATIVE
Nitrite, UA: NEGATIVE
Protein, UA: NEGATIVE
RBC UA: NEGATIVE
SPEC GRAV UA: 1.025 (ref 1.030–1.035)
Urobilinogen, UA: NEGATIVE (ref ?–2.0)
pH, UA: 6 (ref 5.0–8.0)

## 2017-02-14 LAB — COMPREHENSIVE METABOLIC PANEL
ALBUMIN: 4.2 g/dL (ref 3.5–5.2)
ALT: 27 U/L (ref 0–53)
AST: 23 U/L (ref 0–37)
Alkaline Phosphatase: 44 U/L (ref 39–117)
BUN: 14 mg/dL (ref 6–23)
CALCIUM: 9.4 mg/dL (ref 8.4–10.5)
CHLORIDE: 105 meq/L (ref 96–112)
CO2: 30 meq/L (ref 19–32)
CREATININE: 0.84 mg/dL (ref 0.40–1.50)
GFR: 97.28 mL/min (ref >60.00)
Glucose, Bld: 189 mg/dL — ABNORMAL HIGH (ref 70–99)
Potassium: 4.4 mEq/L (ref 3.5–5.1)
Sodium: 141 mEq/L (ref 135–145)
Total Bilirubin: 0.7 mg/dL (ref 0.2–1.2)
Total Protein: 6.7 g/dL (ref 6.0–8.3)

## 2017-02-14 LAB — LIPID PANEL
CHOL/HDL RATIO: 2
CHOLESTEROL: 90 mg/dL (ref 0–200)
HDL: 37.6 mg/dL — AB (ref >39.00)
LDL CALC: 32 mg/dL (ref 0–99)
NonHDL: 52.4
TRIGLYCERIDES: 104 mg/dL (ref 0.0–149.0)
VLDL: 20.8 mg/dL (ref 0.0–40.0)

## 2017-02-14 LAB — CBC WITH DIFFERENTIAL/PLATELET
BASOS PCT: 0.9 % (ref 0.0–3.0)
Basophils Absolute: 0 10*3/uL (ref 0.0–0.1)
EOS ABS: 0.1 10*3/uL (ref 0.0–0.7)
Eosinophils Relative: 2.6 % (ref 0.0–5.0)
HEMATOCRIT: 42.7 % (ref 39.0–52.0)
Hemoglobin: 14.6 g/dL (ref 13.0–17.0)
LYMPHS PCT: 32.7 % (ref 12.0–46.0)
Lymphs Abs: 1.8 10*3/uL (ref 0.7–4.0)
MCHC: 34.1 g/dL (ref 30.0–36.0)
MCV: 88.5 fl (ref 78.0–100.0)
Monocytes Absolute: 0.4 10*3/uL (ref 0.1–1.0)
Monocytes Relative: 7.2 % (ref 3.0–12.0)
Neutro Abs: 3.1 10*3/uL (ref 1.4–7.7)
Neutrophils Relative %: 56.6 % (ref 43.0–77.0)
PLATELETS: 201 10*3/uL (ref 150.0–400.0)
RBC: 4.83 Mil/uL (ref 4.22–5.81)
RDW: 14 % (ref 11.5–15.5)
WBC: 5.5 10*3/uL (ref 4.0–10.5)

## 2017-02-14 MED ORDER — ATORVASTATIN CALCIUM 40 MG PO TABS: ORAL_TABLET | ORAL | 5 refills | Status: DC

## 2017-02-14 NOTE — Telephone Encounter (Signed)
rx refill of lipitor sent in to pharmacy.

## 2017-02-14 NOTE — Patient Instructions (Addendum)
Your bp is well controlled today on second check. Continue current bp meds.   For high cholesterol will check lipid panel fasting today. May adjust dose if necessary post labs.  For diabetes continue current plan as described by Dr. Lucianne Muss.  Will get labs today cbc, cmp, lipid and UA.  To be determined after lab review

## 2017-02-14 NOTE — Progress Notes (Signed)
Subjective:    Patient ID: Samuel [name], male    DOB: September 07, 1951, 66 y.o.   MRN: [id number]  HPI Pt in for follow up.    Pt bp is well controlled. Today second check of bp was better. 137/64. No cardiac signs or symptoms.   Pt is on lipitor for his cholesterol. Pt is walkng 3-4 times a week a mile approximate.  Pt has seen endocrinologist in recent past. Pt a1c was 8.0.  Pt needs foot exam today.  Up to date on vaccines.   Pt called and states wanted a check up. Staff thought he is wanted a physical. I saw in chart 9-2- 2017 labs order for encounter physical exam.(done by [facility]) So decided not to code physical but to do appropiate associated with his medical problems which would cover most of routine labs on physical.  Pt had no urinary obstructive symptoms so psa not done.     Review of Systems  Constitutional: Negative for chills, fatigue and fever.  HENT: Negative for congestion, ear discharge, facial swelling, mouth sores and postnasal drip.   Respiratory: Negative for cough, chest tightness and shortness of breath.   Cardiovascular: Negative for chest pain and palpitations.  Gastrointestinal: Negative for abdominal pain and anal bleeding.  Genitourinary: Negative for discharge, flank pain, frequency, hematuria and penile pain.  Musculoskeletal: Negative for back pain, gait problem, joint swelling and neck pain.  Neurological: Negative for dizziness, speech difficulty, weakness, numbness and headaches.  Hematological: Negative for adenopathy. Does not bruise/bleed easily.  Psychiatric/Behavioral: Negative for behavioral problems and confusion.    Past Medical History:  Diagnosis Date  . Blood transfusion without reported diagnosis   . Diabetes mellitus without complication ([place])   . History of ETOH abuse   . Hypertension      Social History   Social History  . Marital status: Married    Spouse name: N/A  . Number of children: N/A  . Years of education: N/A    Occupational History  . Not on file.   Social History Main Topics  . Smoking status: Former Research scientist (life sciences)  . Smokeless tobacco: Former Systems developer    Quit date: 11/08/1993  . Alcohol use No  . Drug use: No  . Sexual activity: Not on file   Other Topics Concern  . Not on file   Social History Narrative  . No narrative on file    Past Surgical History:  Procedure Laterality Date  . BACK SURGERY N/A 2010    Family History  Problem Relation Age of Onset  . Diabetes    . Hypertension    . Arthritis Father   . Diabetes Father     No Known Allergies  Current Outpatient Prescriptions on File Prior to Visit  Medication Sig Dispense Refill  . aspirin 325 MG tablet Take 81 mg by mouth daily.     [name] atorvastatin (LIPITOR) 40 MG tablet TAKE 1 TABLET BY MOUTH DAILY AND 1/2 TABLET AT BEDTIME 45 tablet 5  . cholecalciferol (VITAMIN D) 1000 UNITS tablet Take 1,000 Units by mouth daily. Reported on 11/05/2015    . Choline Fenofibrate 45 MG capsule Take 1 capsule (45 mg total) by mouth daily. 90 capsule 0  . Dulaglutide (TRULICITY) 1.5 OQ/9.4TM SOPN Inject weekly 4 pen 5  . glucose blood (ONE TOUCH ULTRA TEST) test strip TEST THREE TIMES DAILY 300 each 1  . Insulin Disposable Pump (V-GO 40) KIT Use 1 device per day with insulin 30 kit 3  .  insulin lispro (HUMALOG KWIKPEN) 100 UNIT/ML KiwkPen INJECT 35 UNITS UNDER THE SKIN BEFORE MEALS- ADD 10 UNITS AFTER SUPPER IF SUGAR CONTINUES TO BE HIGH 5 pen 4  . insulin lispro (HUMALOG) 100 UNIT/ML injection Use 78 units in vgo 30 mL 3  . Insulin Pen Needle 32G X 4 MM MISC Use 5 pens per day to inject insulin 200 each 3  . losartan (COZAAR) 50 MG tablet TAKE 1 TABLET(50 MG) BY MOUTH DAILY 90 tablet 1  . meloxicam (MOBIC) 7.5 MG tablet TAKE 1 TO 2 TABLETS BY MOUTH EVERY DAY 90 tablet 0  . metFORMIN (GLUCOPHAGE) 1000 MG tablet TAKE 1 TABLET BY MOUTH TWICE DAILY WITH MEALS 180 tablet 0  . tamsulosin (FLOMAX) 0.4 MG CAPS capsule Take 1 capsule (0.4 mg total) by  mouth daily. 30 capsule 3   No current facility-administered medications on file prior to visit.     BP 137/64   Pulse 63   Temp 98.3 F (36.8 C) (Oral)   Ht _0  (1.6 m)   Wt 212 lb 12.8 oz (96.5 kg)   SpO2 97%   BMI 37.70 kg/m       Objective:   Physical Exam  General Mental Status- Alert. General Appearance- Not in acute distress.   Skin General: Color- Normal Color. Moisture- Normal Moisture.  Neck Carotid Arteries- Normal color. Moisture- Normal Moisture. No carotid bruits. No JVD.  Chest and Lung Exam Auscultation: Breath Sounds:-Normal.  Cardiovascular Auscultation:Rythm- Regular. Murmurs & Other Heart Sounds:Auscultation of the heart reveals- No Murmurs.  Abdomen Inspection:-Inspeection Normal. Palpation/Percussion:Note:No mass. Palpation and Percussion of the abdomen reveal- Non Tender, Non Distended + BS, no rebound or guarding.    Neurologic Cranial Nerve exam:- CN III-XII intact(No nystagmus), symmetric smile. Drift Test:- No drift. Romberg Exam:- Negative.  Heal to Toe Gait exam:-Normal. Finger to Nose:- Normal/Intact Strength:- 5/5 equal and symmetric strength both upper and lower extremities.  Genital- no hernia on palpation either canal. Rectal - pt declined.  Feet- see quality metrics.        Assessment & Plan:  Your bp is well controlled today on second check. Continue current bp meds.   For high cholesterol will check lipid panel fasting today. May adjust dose if necessary post labs.   For diabetes continue current plan as described by [name].  Will get labs today cbc, cmp, lipid and UA.  To be determined after lab review.

## 2017-02-14 NOTE — Progress Notes (Signed)
Pre visit review using our clinic tool,if applicable. No additional management support is needed unless otherwise documented below in the visit note.  

## 2017-02-15 ENCOUNTER — Telehealth: Payer: Self-pay | Admitting: Medical

## 2017-02-15 ENCOUNTER — Ambulatory Visit (INDEPENDENT_AMBULATORY_CARE_PROVIDER_SITE_OTHER): Payer: Medicare Other | Admitting: Endocrinology

## 2017-02-15 ENCOUNTER — Encounter: Payer: Self-pay | Admitting: Endocrinology

## 2017-02-15 VITALS — BP 122/60 | HR 67 | Ht 63.0 in | Wt 213.0 lb

## 2017-02-15 DIAGNOSIS — Z794 Long term (current) use of insulin: Secondary | ICD-10-CM | POA: Diagnosis not present

## 2017-02-15 DIAGNOSIS — E1165 Type 2 diabetes mellitus with hyperglycemia: Secondary | ICD-10-CM | POA: Diagnosis not present

## 2017-02-15 LAB — POCT GLYCOSYLATED HEMOGLOBIN (HGB A1C): HEMOGLOBIN A1C: 7.1

## 2017-02-15 MED ORDER — V-GO 40 KIT: PACK | 3 refills | Status: DC

## 2017-02-15 MED ORDER — INSULIN LISPRO 100 UNIT/ML (KWIKPEN): PEN_INJECTOR | SUBCUTANEOUS | 4 refills | Status: DC

## 2017-02-15 MED ORDER — INSULIN LISPRO 100 UNIT/ML ~~LOC~~ SOLN: SUBCUTANEOUS | 3 refills | Status: DC

## 2017-02-15 NOTE — Patient Instructions (Addendum)
Apply to arm  Must use pump for 24 hours, then am sugar will be ok  If out of insulin at dinner give 10-14 units with pen  Aplicar al brazo  Debe usar la bomba durante 24 horas, luego, el azcar estar bien  Si no tiene [employer], d 10-14 unidades con la pluma

## 2017-02-15 NOTE — Telephone Encounter (Signed)
Pt was informed and understood about labs and to continue taken lipitor, pt states will call us to schedule his 3 month fu appt with his lab appt same day.

## 2017-02-15 NOTE — Progress Notes (Signed)
Patient ID: Samuel [name], male   DOB: 02/06/1951, 66 y.o.   MRN: [id number]            Reason for Appointment:  Followup for Type 2 Diabetes  Referring physician: Saguier  History of Present Illness:          Diagnosis: Type 2 diabetes mellitus, date of diagnosis:  1990      Past history: He thinks he has been on insulin for the last 10-12 years. Previously had been on metformin which has been continued. He was probably tried on different insulin regimens initially but has been taking 70/30 mostly because of cost Previous records are not available and appears that his sugars have been poorly controlled for several years He was  referred here because of an A1c in 8/15 of 9.7%. He was on a regimen of Novolin 70/30 twice a day but because of inadequate control and higher readings later in the day he was switched to basal bolus insulin regimen in 12/15.  Recent history:   INSULIN regimen is described as:  for 2 weeks only Humalog at meals  V-go basal 40 units daily with boluses 12 units at meals  Non-insulin hypoglycemic drugs: metformin 1 g twice a day, Trulicity 3.08 mg weekly     Previously A1c has been generally over 8-9% and is now down to 7.1  Current management, blood sugar patterns and problems identified:  He now has stopped using his V-go pump, even though he had been taking this consistently now says that it is difficult to apply and take off because of the glue causing discomfort and burning especially since he has to shave his abdomen  He did not try his arm.  He also says that he would run out of clicks around dinnertime and he would take off his pump anyway  He was told to take Humalog with the pen to supplement his pump bolus doses but not clear if he was doing this  With his above management his FASTING blood sugars are always high now  Difficult to see any change in his postprandial readings since he went off his pump but they were probably better before and  after supper.  He has gone off Trulicity as it was too expensive and has gained back a couple of pounds.  He was previously able to control his portions better        Side effects from medications have been: ?  Nausea/dizziness on Victoza  Compliance with the medical regimen: Fair  Hypoglycemia:   rarely, has only one low sugar at 1 AM  Glucose monitoring:  done 2-3 times a day         Glucometer:  One Touch ultra 2     Blood Glucose readings by time of day by review of meter download   Mean values apply above for all meters except median for One Touch  PRE-MEAL Fasting Lunch Dinner Bedtime Overall  Glucose range: 149-221 78-287  62-213  78-238    Mean/median: 193 152  183  141  183   Self-care:  Not consistently controlling portions and may have unbalanced carbohydrate meals  Meals: 3 meals per day. Breakfast at 8-9 am is various kinds of bread, milk and sometimes eggs.  Dinner at 6 pm    Dietician visit, most recent: 5/16 Last CDE visit: 12/2016                 Exercise: Walking some, Weather permitting  Weight history: Previous  range 200-220  Wt Readings from Last 3 Encounters:  02/15/17 213 lb (96.6 kg)  02/14/17 212 lb 12.8 oz (96.5 kg)  01/06/17 211 lb (95.7 kg)    Glycemic control:   Lab Results  Component Value Date   HGBA1C 7.1 02/15/2017   HGBA1C 8.0 09/16/2016   HGBA1C 8.1 04/29/2016   Lab Results  Component Value Date   MICROALBUR 1.1 04/29/2016   LDLCALC 32 02/14/2017   CREATININE 0.84 02/14/2017    No results found for: FRUCTOSAMINE     Allergies as of 02/15/2017   No Known Allergies     Medication List       Accurate as of 02/15/17  5:10 PM. Always use your most recent med list.          aspirin 325 MG tablet Take 81 mg by mouth daily.   atorvastatin 40 MG tablet Commonly known as:  LIPITOR TAKE 1 TABLET BY MOUTH DAILY AND 1/2 TABLET AT BEDTIME   cholecalciferol 1000 units tablet Commonly known as:  VITAMIN D Take 1,000 Units by  mouth daily. Reported on 11/05/2015   Choline Fenofibrate 45 MG capsule Take 1 capsule (45 mg total) by mouth daily.   Dulaglutide 1.5 MG/0.5ML Sopn Commonly known as:  TRULICITY Inject weekly   glucose blood test strip Commonly known as:  ONE TOUCH ULTRA TEST TEST THREE TIMES DAILY   insulin lispro 100 UNIT/ML KiwkPen Commonly known as:  HUMALOG KWIKPEN INJECT 35 UNITS UNDER THE SKIN BEFORE MEALS- ADD 10 UNITS AFTER SUPPER IF SUGAR CONTINUES TO BE HIGH   insulin lispro 100 UNIT/ML injection Commonly known as:  HUMALOG Use 78 units in vgo   Insulin Pen Needle 32G X 4 MM Misc Use 5 pens per day to inject insulin   losartan 50 MG tablet Commonly known as:  COZAAR TAKE 1 TABLET(50 MG) BY MOUTH DAILY   meloxicam 7.5 MG tablet Commonly known as:  MOBIC TAKE 1 TO 2 TABLETS BY MOUTH EVERY DAY   metFORMIN 1000 MG tablet Commonly known as:  GLUCOPHAGE TAKE 1 TABLET BY MOUTH TWICE DAILY WITH MEALS   tamsulosin 0.4 MG Caps capsule Commonly known as:  FLOMAX Take 1 capsule (0.4 mg total) by mouth daily.   V-GO 40 Kit Use 1 device per day with insulin       Allergies: No Known Allergies  Past Medical History:  Diagnosis Date  . Blood transfusion without reported diagnosis   . Diabetes mellitus without complication ([place])   . History of ETOH abuse   . Hypertension     Past Surgical History:  Procedure Laterality Date  . BACK SURGERY N/A 2010    Family History  Problem Relation Age of Onset  . Diabetes    . Hypertension    . Arthritis Father   . Diabetes Father     Social History:  reports that he has quit smoking. He quit smokeless tobacco use about 23 years ago. He reports that he does not drink alcohol or use drugs.    Review of Systems         Lipids:  He has been on Lipitor 40 mg for hypercholesterolemia since about 2011 good control Also on low-dose fenofibrate from PCP       Lab Results  Component Value Date   CHOL 90 02/14/2017   HDL 37.60 (L)  02/14/2017   LDLCALC 32 02/14/2017   LDLDIRECT 40.0 02/13/2016   TRIG 104.0 02/14/2017   CHOLHDL 2 02/14/2017  The blood pressure has been treated with losartan 50 mg, prescribed by PCP   Last foot exam was in 1/17       Physical Examination:  BP 122/60   Pulse 67   Ht 5' 3" (1.6 m)   Wt 213 lb (96.6 kg)   BMI 37.73 kg/m      ASSESSMENT:  Diabetes type 2, uncontrolled with obesity and BMI of 36  See history of present illness for detailed discussion of current diabetes management, blood sugar patterns and problems identified.[name]  His blood sugars are overall better with using the V-go pump and his A1c is the best in the long time with this However he is still not understanding the basic use of the pump He is complaining about the stickiness of his V-go pump to his abdomen where he has more hair but he has not tried his arm Also not understanding that the pump delivers basal insulin for 24 hours and he is usually not letting it continue until the next morning because of running out of boluses This is causing persistently high fasting readings and blood sugars are still relatively high before lunch He does have probably little higher blood sugar with stopping Trulicity and gaining a little weight, was doing better with portions with Trulicity He cannot afford this apparently   PLAN:   He will resume the pump with the 40 units basal and applied to his arm  A sample pack was given and he will talk to be 800 number for further details on how to use it on his arm and any practical issues  Discussed the difference between the basal and bolus functions of the pump  Also discussed that he has the option of using the Humalog pen if he needs supplemental insulin to cover his evening meal  Difficult to know how much insulin he will require for meals as his doses may change with using the basal consistently over 24 hours  Also advised him that he can work with a Teacher, music  program for help with his co-pay with various products if needed  Patient education and instructions  relayed through the interpreter today  Patient Instructions  Apply to arm  Must use pump for 24 hours, then am sugar will be ok  If out of insulin at dinner give 10-14 units with pen  Aplicar al brazo  Debe usar la bomba durante 24 horas, luego, el azcar estar bien  Si no tiene [employer], d 10-14 unidades con la pluma   Counseling time on subjects discussed above is over 50% of today's 25 minute visit   Drayson Dorko 02/15/2017, 5:10 PM   Note: This office note was prepared with Estate agent. Any transcriptional errors that result from this process are unintentional.

## 2017-02-15 NOTE — Telephone Encounter (Signed)
-----   Message from Esperanza Richters, PA-C sent at 02/14/2017  9:54 PM EDT ----- Urine looks clear.  metabolic panel looks normal. Good kidney function. Sugar mild elevated at time of lab at 189. Cholesterol looks good except mild low hdl. Continue lipitor at same dose. Labs show no anemia and normal infection fighting cells. Follow up in 3 months and come in fasting. Will repeat labs on follow up.

## 2017-03-22 ENCOUNTER — Other Ambulatory Visit: Payer: Self-pay

## 2017-03-22 MED ORDER — "INSULIN SYRINGE 30G X 1/2"" 1 ML MISC": 2 refills | Status: DC

## 2017-04-18 ENCOUNTER — Other Ambulatory Visit: Payer: Self-pay | Admitting: Endocrinology

## 2017-04-18 ENCOUNTER — Encounter: Payer: Self-pay | Admitting: Endocrinology

## 2017-04-18 ENCOUNTER — Ambulatory Visit (INDEPENDENT_AMBULATORY_CARE_PROVIDER_SITE_OTHER): Payer: Medicare Other | Admitting: Endocrinology

## 2017-04-18 VITALS — BP 142/86 | HR 70 | Ht 63.0 in | Wt 212.4 lb

## 2017-04-18 DIAGNOSIS — I10 Essential (primary) hypertension: Secondary | ICD-10-CM | POA: Diagnosis not present

## 2017-04-18 DIAGNOSIS — Z794 Long term (current) use of insulin: Secondary | ICD-10-CM | POA: Diagnosis not present

## 2017-04-18 DIAGNOSIS — E1165 Type 2 diabetes mellitus with hyperglycemia: Secondary | ICD-10-CM

## 2017-04-18 MED ORDER — INSULIN GLARGINE 300 UNIT/ML ~~LOC~~ SOPN: 44.0000 [IU] | PEN_INJECTOR | Freq: Every day | SUBCUTANEOUS | 3 refills | Status: DC

## 2017-04-18 MED ORDER — INSULIN LISPRO 100 UNIT/ML (KWIKPEN): PEN_INJECTOR | SUBCUTANEOUS | 1 refills | Status: DC

## 2017-04-18 MED ORDER — "INSULIN SYRINGE 30G X 5/16"" 0.5 ML MISC": 1 refills | Status: DC

## 2017-04-18 NOTE — Patient Instructions (Signed)
TOUJEO insulin: The dose is 44 units in the morning, take every day at breakfast time  Every 3-4 days check your blood sugar patterns and the morning sugars are consistently over 130 increase the dose by 2 units  HUMALOG: start taking this before every meal and take it if you are eating out also  You will need to take a minimum amount to cover what you are eating:  HUMALOG:Start taking 35 units at breakfast, 40 units at lunch and 30 units at dinnertime May add another 5 units if blood sugars are over 200 before eating  If the blood sugars about 2-3 hours after eating are between 120-180 then you are on the right dose for that meal   Continue Trulicity   Insulina TOUJEO: la dosis es de 44 unidades por la [employer], tomar CarMaxtodos los das a la hora del desayuno  Cada 3-4 das verifique sus patrones de Nurse, learning disabilityazcar en la sangre y los azcares de la maana son consistentemente ms de 130 aumente la dosis en 2 unidades  HUMALOG: comience a tomar esto antes de cada comida y tmelo si tambin est comiendo afuera  Deber tomar una cantidad mnima para cubrir lo que est comiendo:  HUMALOG: Comience tomando 35 unidades en el desayuno, 40 unidades en el almuerzo y 30 unidades en la cena Puede agregar otras 5 unidades si los niveles de azcar en la sangre son ms de 200 antes de comer  Si los niveles de Bankerazcar en la sangre alrededor de 2-3 horas despus de comer estn entre 120-180, entonces est en la dosis correcta para esa comida  Continuar Trulicity

## 2017-04-18 NOTE — Progress Notes (Signed)
Patient ID: Samuel [name], male   DOB: June 02, 1951, 66 y.o.   MRN: [id number]            Reason for Appointment:  Followup for Type 2 Diabetes  Referring physician: Saguier  History of Present Illness:          Diagnosis: Type 2 diabetes mellitus, date of diagnosis:  1990      Past history: He thinks he has been on insulin for the last 10-12 years. Previously had been on metformin which has been continued. He was probably tried on different insulin regimens initially but has been taking 70/30 mostly because of cost Previous records are not available and appears that his sugars have been poorly controlled for several years He was  referred here because of an A1c in 8/15 of 9.7%. He was on a regimen of Novolin 70/30 twice a day but because of inadequate control and higher readings later in the day he was switched to basal bolus insulin regimen in 12/15.  Recent history:   INSULIN regimen is: Humalog at 25-40 ac meals  Off V-go basal 40 units daily with boluses 12 units at meals  Non-insulin hypoglycemic drugs: metformin 1 g twice a day, Trulicity 6.28 mg weekly     Previously A1c has been generally over 8-9% and down to 7.1 in April  Current management, blood sugar patterns and problems identified:  He now has again stopped using his V-go pump, even though he was advised to try and use it on his arm instead of his abdomen where he had more body hair.  He thinks it is still difficult to take off the glue and he does not like the local discomfort  However he did not call us to let us know and has not started using Toujeo which she is to take  Also he now says that he would keep the pump on until the insulin ran out instead of changing every 24 hours  With his above management his FASTING blood sugars are almost always high now  He is also adjusting his HUMALOG based on blood sugar before eating and not what he is eating  He still thinks he has difficulty controlling portions at  times despite taking Trulicity  He has required very high readings in the evenings after supper and now he says that he does not take his Humalog with him when he is going out to eat and will take Humalog at bedtime  He thinks he is taking his Trulicity regularly but is not losing weight        Side effects from medications have been: ?  Nausea/dizziness on Victoza  Compliance with the medical regimen: Fair  Hypoglycemia:   rarely, has only one low sugar at 1 AM  Glucose monitoring:  done 2-3 times a day         Glucometer:  One Touch ultra 2     Blood Glucose readings by time of day by review of meter download   Mean values apply above for all meters except median for One Touch  PRE-MEAL Fasting Lunch Dinner Bedtime Overall  Glucose range:  96-241    77-3 39    Mean/median: 188  207  234  279  207+/-63    Self-care:  Not consistently controlling portions and may have unbalanced meals  Meals: 3 meals per day. Breakfast at 8-9 am is various kinds of bread, milk and sometimes eggs. Lunch generally larger meal Dinner at 6 pm  Dietician visit, most recent: 5/16 Last CDE visit: 12/2016                 Exercise: Walking 1 mile, Weather permitting  Weight history: Previous range 200-220  Wt Readings from Last 3 Encounters:  04/18/17 212 lb 6.4 oz (96.3 kg)  02/15/17 213 lb (96.6 kg)  02/14/17 212 lb 12.8 oz (96.5 kg)    Glycemic control:   Lab Results  Component Value Date   HGBA1C 7.1 02/15/2017   HGBA1C 8.0 09/16/2016   HGBA1C 8.1 04/29/2016   Lab Results  Component Value Date   MICROALBUR 1.1 04/29/2016   LDLCALC 32 02/14/2017   CREATININE 0.84 02/14/2017    No results found for: FRUCTOSAMINE     Allergies as of 04/18/2017   No Known Allergies     Medication List       Accurate as of 04/18/17  8:56 PM. Always use your most recent med list.          aspirin 325 MG tablet Take 81 mg by mouth daily.   atorvastatin 40 MG tablet Commonly known as:   LIPITOR TAKE 1 TABLET BY MOUTH DAILY AND 1/2 TABLET AT BEDTIME   cholecalciferol 1000 units tablet Commonly known as:  VITAMIN D Take 1,000 Units by mouth daily. Reported on 11/05/2015   Dulaglutide 1.5 MG/0.5ML Sopn Commonly known as:  TRULICITY Inject weekly   glucose blood test strip Commonly known as:  ONE TOUCH ULTRA TEST TEST THREE TIMES DAILY   Insulin Glargine 300 UNIT/ML Sopn Commonly known as:  TOUJEO SOLOSTAR Inject 44 Units into the skin daily.   insulin lispro 100 UNIT/ML KiwkPen Commonly known as:  HUMALOG KWIKPEN INJECT 35 units before meals 3 times a day   Insulin Pen Needle 32G X 4 MM Misc Use 5 pens per day to inject insulin   INSULIN SYRINGE .5CC/30GX5/16" 30G X 5/16" 0.5 ML Misc Use 3 times a day for insulin   losartan 50 MG tablet Commonly known as:  COZAAR TAKE 1 TABLET(50 MG) BY MOUTH DAILY   meloxicam 7.5 MG tablet Commonly known as:  MOBIC TAKE 1 TO 2 TABLETS BY MOUTH EVERY DAY   metFORMIN 1000 MG tablet Commonly known as:  GLUCOPHAGE TAKE 1 TABLET BY MOUTH TWICE DAILY WITH MEALS   tamsulosin 0.4 MG Caps capsule Commonly known as:  FLOMAX Take 1 capsule (0.4 mg total) by mouth daily.   V-GO 40 Kit Use 1 device per day with insulin       Allergies: No Known Allergies  Past Medical History:  Diagnosis Date  . Blood transfusion without reported diagnosis   . Diabetes mellitus without complication ([place])   . History of ETOH abuse   . Hypertension     Past Surgical History:  Procedure Laterality Date  . BACK SURGERY N/A 2010    Family History  Problem Relation Age of Onset  . Diabetes Unknown   . Hypertension Unknown   . Arthritis Father   . Diabetes Father     Social History:  reports that he has quit smoking. He quit smokeless tobacco use about 23 years ago. He reports that he does not drink alcohol or use drugs.    Review of Systems         Lipids:  He has been on Lipitor 40 mg for hypercholesterolemia since about  2011 good control Also on low-dose fenofibrate from PCP       Lab Results  Component Value Date  CHOL 90 02/14/2017   HDL 37.60 (L) 02/14/2017   LDLCALC 32 02/14/2017   LDLDIRECT 40.0 02/13/2016   TRIG 104.0 02/14/2017   CHOLHDL 2 02/14/2017       The blood pressure has been treated with losartan 50 mg, prescribed by PCP   Last foot exam was in 1/17       Physical Examination:  BP (!) 142/86   Pulse 70   Ht '5\' 3"'  (1.6 m)   Wt 212 lb 6.4 oz (96.3 kg)   SpO2 96%   BMI 37.62 kg/m      ASSESSMENT:  Diabetes type 2, uncontrolled with obesity and BMI of 36  See history of present illness for detailed discussion of current diabetes management, blood sugar patterns and problems identified.[name]  His blood sugars are were better with the V-go pump but he does not want to use this for practical reasons; however even when using the pump he was not changing the unit every 24 hours and fasting readings were not controlled He generally requires more insulin when he is not on the pump Still does not understand the need for a basal insulin separately and only using Humalog currently Not clear if he is benefiting much from Trulicity although he thinks he can control portions better with this  HYPERTENSION: Pressure to be followed by PCP, he tends to be anxious coming in here  PLAN:   He will resume the Toujeo insulin starting with 44 units  He does need to go up by 2 units every 3-4 days a fasting readings are above target  He will be given a set dose of 35-40-30 for his Humalog and told him not to adjust it based on pre-meal blood sugar unless it is over 200 when he cannot 5 units more  He will take the Humalog a few minutes before eating  He can get another prescription for the [facility] pen so that he can take his insulin before eating at restaurants which he is not doing  Also will give him a smaller needle for his insulin syringe as he does not like the half-inch  needle  Discussed blood sugar targets at various times  Needs to continue Trulicity weekly  Continue to improve diet with lower carbohydrate intake  Patient education and instructions relayed through the interpreter today and Spanish translation given  Patient Instructions  TOUJEO insulin: The dose is 44 units in the morning, take every day at breakfast time  Every 3-4 days check your blood sugar patterns and the morning sugars are consistently over 130 increase the dose by 2 units  HUMALOG: start taking this before every meal and take it if you are eating out also  You will need to take a minimum amount to cover what you are eating:  HUMALOG:Start taking 35 units at breakfast, 40 units at lunch and 30 units at dinnertime May add another 5 units if blood sugars are over 200 before eating  If the blood sugars about 2-3 hours after eating are between 120-180 then you are on the right dose for that meal   Continue Trulicity   Insulina TOUJEO: la dosis es de 44 unidades por la [place], tomar [employer] a la hora del desayuno  Cada 3-4 das verifique sus patrones de Location manager en la sangre y los azcares de la maana son consistentemente ms de 130 aumente la dosis en 2 unidades  HUMALOG: comience a tomar esto antes de cada comida y tmelo si tambin est  comiendo afuera  Deber tomar una cantidad mnima para cubrir lo que est comiendo:  HUMALOG: Comience tomando 35 unidades en el desayuno, 40 unidades en el almuerzo y 37 unidades en la cena Puede agregar otras 5 unidades si los niveles de azcar en la sangre son ms de 200 antes de comer  Si los niveles de Dispensing optician alrededor de 2-3 horas despus de comer estn entre 120-180, entonces est en la dosis correcta para esa comida  Continuar Trulicity     Counseling time on subjects discussed above is over 50% of today's 25 minute visit   Dashonda Bonneau 04/18/2017, 8:56 PM   Note: This office note was prepared with Theatre stage manager. Any transcriptional errors that result from this process are unintentional.

## 2017-05-17 ENCOUNTER — Telehealth: Payer: Self-pay | Admitting: Medical

## 2017-05-17 ENCOUNTER — Ambulatory Visit (INDEPENDENT_AMBULATORY_CARE_PROVIDER_SITE_OTHER): Payer: Medicare Other | Admitting: Medical

## 2017-05-17 ENCOUNTER — Other Ambulatory Visit: Payer: Self-pay | Admitting: Medical

## 2017-05-17 VITALS — BP 132/70 | HR 64 | Temp 98.1°F | Resp 16 | Ht 63.0 in | Wt 210.6 lb

## 2017-05-17 DIAGNOSIS — M79645 Pain in left finger(s): Secondary | ICD-10-CM | POA: Diagnosis not present

## 2017-05-17 DIAGNOSIS — Z794 Long term (current) use of insulin: Secondary | ICD-10-CM | POA: Diagnosis not present

## 2017-05-17 DIAGNOSIS — I10 Essential (primary) hypertension: Secondary | ICD-10-CM | POA: Diagnosis not present

## 2017-05-17 DIAGNOSIS — L989 Disorder of the skin and subcutaneous tissue, unspecified: Secondary | ICD-10-CM | POA: Diagnosis not present

## 2017-05-17 DIAGNOSIS — J3089 Other allergic rhinitis: Secondary | ICD-10-CM

## 2017-05-17 DIAGNOSIS — E119 Type 2 diabetes mellitus without complications: Secondary | ICD-10-CM | POA: Diagnosis not present

## 2017-05-17 DIAGNOSIS — E785 Hyperlipidemia, unspecified: Secondary | ICD-10-CM

## 2017-05-17 LAB — LIPID PANEL
CHOL/HDL RATIO: 2
Cholesterol: 80 mg/dL (ref 0–200)
HDL: 39.4 mg/dL (ref >39.00)
LDL Cholesterol: 23 mg/dL (ref 0–99)
NonHDL: 40.74
Triglycerides: 88 mg/dL (ref 0.0–149.0)
VLDL: 17.6 mg/dL (ref 0.0–40.0)

## 2017-05-17 LAB — COMPREHENSIVE METABOLIC PANEL
ALBUMIN: 4.2 g/dL (ref 3.5–5.2)
ALK PHOS: 42 U/L (ref 39–117)
ALT: 31 U/L (ref 0–53)
AST: 28 U/L (ref 0–37)
BILIRUBIN TOTAL: 0.7 mg/dL (ref 0.2–1.2)
BUN: 20 mg/dL (ref 6–23)
CO2: 29 mEq/L (ref 19–32)
Calcium: 9.3 mg/dL (ref 8.4–10.5)
Chloride: 104 mEq/L (ref 96–112)
Creatinine, Ser: 0.83 mg/dL (ref 0.40–1.50)
GFR: 98.56 mL/min (ref >60.00)
GLUCOSE: 131 mg/dL — AB (ref 70–99)
POTASSIUM: 4 meq/L (ref 3.5–5.1)
SODIUM: 141 meq/L (ref 135–145)
TOTAL PROTEIN: 6.7 g/dL (ref 6.0–8.3)

## 2017-05-17 LAB — HEMOGLOBIN A1C: HEMOGLOBIN A1C: 8.9 % — AB (ref 4.6–6.5)

## 2017-05-17 MED ORDER — LEVOCETIRIZINE DIHYDROCHLORIDE 5 MG PO TABS: 5.0000 mg | ORAL_TABLET | Freq: Every evening | ORAL | 0 refills | Status: DC

## 2017-05-17 NOTE — Telephone Encounter (Signed)
Called pt and informed him the below results, pt understood, pt stated already has an appt for Aug 17, 2017 and would like to stay with this appt during the mean time.

## 2017-05-17 NOTE — Patient Instructions (Addendum)
Your blood pressure if well controlled today continue losartan.  Most recent a1-c was 7.1 in April will recheck today. When I get the result will forward to Dr. Lucianne MussKumar.  For high cholesterol will get lipid panel today. Also cmp today.  Your runny nose episodes do sound suspicous for allergic rhinitis. Will rx xyzal when episodes occur.  Regarding left thumb small raised lesion will refer you to dermatologist at your request. Try to get you in with former dermatologist who called you recently to try to get you in for follow up.  Follow up in 3-6 months or as needed(follow up date would depend on lab results)

## 2017-05-17 NOTE — Progress Notes (Signed)
Subjective:    Patient ID: Samuel [name], male    DOB: July 05, 1951, 66 y.o.   MRN: [id number]  HPI  Pt in for follow up. Htn, diabetes, and hyperlipidemia.   Pt had a1c in April 7.1. Pt sees endocrinologist. Pt walking 3 times a week. Pt is on insulin.  Pt bp is on Htn meds. BP is controlled today.  Pt cholesterol on last checks was good.  Pt states occasionally will get runny clear nose. Runs like water. At times will sneeze. On and off sporadic.    Left thumb tender. See hpi regarding onset duration and possible cause.   Review of Systems  Constitutional: Negative for chills, fatigue and fever.  HENT: Positive for rhinorrhea and sneezing. Negative for congestion, hearing loss, nosebleeds, sinus pain, sore throat, tinnitus and voice change.        Runny nose on and off for one year.  Respiratory: Negative for cough, chest tightness, shortness of breath and wheezing.   Cardiovascular: Negative for chest pain and palpitations.  Gastrointestinal: Negative for abdominal pain.  Endocrine: Negative for polydipsia, polyphagia and polyuria.  Genitourinary: Negative for dysuria, flank pain and frequency.  Musculoskeletal: Negative for back pain and neck pain.  Skin: Negative for rash.       Left thumb small tender area for one year. Pt not sure what is cause. Not sure if thorn. Then speculates maybe he burnt area on something hot. He is not sure.   Neurological: Negative for dizziness, speech difficulty, weakness, numbness and headaches.  Hematological: Negative for adenopathy. Does not bruise/bleed easily.  Psychiatric/Behavioral: Negative for behavioral problems, decreased concentration and sleep disturbance. The patient is not nervous/anxious.     Past Medical History:  Diagnosis Date  . Blood transfusion without reported diagnosis   . Diabetes mellitus without complication ([place])   . History of ETOH abuse   . Hypertension      Social History   Social History  . Marital  status: Married    Spouse name: N/A  . Number of children: N/A  . Years of education: N/A   Occupational History  . Not on file.   Social History Main Topics  . Smoking status: Former Research scientist (life sciences)  . Smokeless tobacco: Former Systems developer    Quit date: 11/08/1993  . Alcohol use No  . Drug use: No  . Sexual activity: Not on file   Other Topics Concern  . Not on file   Social History Narrative  . No narrative on file    Past Surgical History:  Procedure Laterality Date  . BACK SURGERY N/A 2010    Family History  Problem Relation Age of Onset  . Diabetes Unknown   . Hypertension Unknown   . Arthritis Father   . Diabetes Father     No Known Allergies  Current Outpatient Prescriptions on File Prior to Visit  Medication Sig Dispense Refill  . aspirin 325 MG tablet Take 81 mg by mouth daily.     [name] atorvastatin (LIPITOR) 40 MG tablet TAKE 1 TABLET BY MOUTH DAILY AND 1/2 TABLET AT BEDTIME 45 tablet 5  . cholecalciferol (VITAMIN D) 1000 UNITS tablet Take 1,000 Units by mouth daily. Reported on 11/05/2015    . Dulaglutide (TRULICITY) 1.5 SA/6.3KZ SOPN Inject weekly 4 pen 5  . glucose blood (ONE TOUCH ULTRA TEST) test strip TEST THREE TIMES DAILY 300 each 1  . HUMALOG KWIKPEN 100 UNIT/ML KiwkPen ADMINISTER 35 UNITS UNDER THE SKIN THREE TIMES DAILY BEFORE MEALS 96  mL 1  . Insulin Disposable Pump (V-GO 40) KIT Use 1 device per day with insulin 30 kit 3  . Insulin Glargine (TOUJEO SOLOSTAR) 300 UNIT/ML SOPN Inject 44 Units into the skin daily. 3 pen 3  . Insulin Pen Needle 32G X 4 MM MISC Use 5 pens per day to inject insulin 200 each 3  . Insulin Syringe-Needle U-100 (INSULIN SYRINGE .5CC/30GX5/16") 30G X 5/16" 0.5 ML MISC Use 3 times a day for insulin 100 each 1  . losartan (COZAAR) 50 MG tablet TAKE 1 TABLET(50 MG) BY MOUTH DAILY 90 tablet 1  . meloxicam (MOBIC) 7.5 MG tablet TAKE 1 TO 2 TABLETS BY MOUTH EVERY DAY 90 tablet 0  . metFORMIN (GLUCOPHAGE) 1000 MG tablet TAKE 1 TABLET BY MOUTH  TWICE DAILY WITH MEALS 180 tablet 0  . tamsulosin (FLOMAX) 0.4 MG CAPS capsule Take 1 capsule (0.4 mg total) by mouth daily. 30 capsule 3   No current facility-administered medications on file prior to visit.     BP 132/70   Pulse 64   Temp 98.1 F (36.7 C) (Oral)   Resp 16   Ht _0  (1.6 m)   Wt 210 lb 9.6 oz (95.5 kg)   SpO2 99%   BMI 37.31 kg/m       Objective:   Physical Exam  General Mental Status- Alert. General Appearance- Not in acute distress.   Skin General: Color- Normal Color. Moisture- Normal Moisture.  Neck Carotid Arteries- Normal color. Moisture- Normal Moisture. No carotid bruits. No JVD.  Chest and Lung Exam Auscultation: Breath Sounds:-Normal.  Cardiovascular Auscultation:Rythm- Regular. Murmurs & Other Heart Sounds:Auscultation of the heart reveals- No Murmurs.  Abdomen Inspection:-Inspeection Normal. Palpation/Percussion:Note:No mass. Palpation and Percussion of the abdomen reveal- Non Tender, Non Distended + BS, no rebound or guarding.   Neurologic Cranial Nerve exam:- CN III-XII intact(No nystagmus), symmetric smile. Strength:- 5/5 equal and symmetric strength both upper and lower extremities.  Lower ext- no lesions. Good pulse See quality metrics.  Left thumb- 6 mm mild raised tender area. No redness. On warmth, no discooration. Uniform in color. Mild tender.    Assessment & Plan:  Your blood pressure if well controlled today continue losartan.  Most recent a1-c was 7.1 in April will recheck today. When I get the result will forward to [name].  For high cholesterol will get lipid panel today. Also cmp today.  Your runny nose episodes do sound suspicous for allergic rhinitis. Will rx xyzal when episodes occur.  Regarding left thumb small raised lesion will refer you to dermatologist at your request. Try to get you in with former dermatologist who called you recently to try to get you in for follow up.(pt declined treatment today.  Unclear etiology. I was considering xray to look for fb, warm salt water soaks bid and trial keflex) But pt prefers derm evaluate.  Follow up in 3-6 months or as needed(follow up date would depend on lab results)

## 2017-05-17 NOTE — Addendum Note (Signed)
Addended by: Gwenevere AbbotSAGUIER, Macklin Jacquin M on: 05/17/2017 08:54 AM   Modules accepted: Orders

## 2017-05-17 NOTE — Telephone Encounter (Signed)
-----   Message from Esperanza RichtersEdward Saguier, PA-C sent at 05/17/2017  4:25 PM EDT ----- Pt sugar is 131 on lab today. Good kidney function. Cholesterol looks very good. His sugar is elevated. Average blood sugar was about was about 210. Pt is spanish speaker. Recommend follow up mid October to end of October. That would also be a good time to get flu vaccine in the fall.

## 2017-05-18 ENCOUNTER — Other Ambulatory Visit: Payer: Self-pay | Admitting: Endocrinology

## 2017-05-18 DIAGNOSIS — E119 Type 2 diabetes mellitus without complications: Secondary | ICD-10-CM

## 2017-06-02 ENCOUNTER — Ambulatory Visit (INDEPENDENT_AMBULATORY_CARE_PROVIDER_SITE_OTHER): Payer: Medicare Other | Admitting: Endocrinology

## 2017-06-02 ENCOUNTER — Encounter: Payer: Self-pay | Admitting: Endocrinology

## 2017-06-02 VITALS — BP 140/82 | HR 72 | Ht 63.0 in | Wt 212.6 lb

## 2017-06-02 DIAGNOSIS — I10 Essential (primary) hypertension: Secondary | ICD-10-CM

## 2017-06-02 DIAGNOSIS — E1165 Type 2 diabetes mellitus with hyperglycemia: Secondary | ICD-10-CM

## 2017-06-02 DIAGNOSIS — Z794 Long term (current) use of insulin: Secondary | ICD-10-CM | POA: Diagnosis not present

## 2017-06-02 MED ORDER — CANAGLIFLOZIN 100 MG PO TABS: ORAL_TABLET | ORAL | 3 refills | Status: DC

## 2017-06-02 NOTE — Progress Notes (Signed)
Patient ID: Samuel [name], male   DOB: Aug 22, 1951, 66 y.o.   MRN: [id number]            Reason for Appointment:  Followup for Type 2 Diabetes  Referring physician: Saguier  History of Present Illness:          Diagnosis: Type 2 diabetes mellitus, date of diagnosis:  1990      Past history: He thinks he has been on insulin for the last 10-12 years. Previously had been on metformin which has been continued. He was probably tried on different insulin regimens initially but has been taking 70/30 mostly because of cost Previous records are not available and appears that his sugars have been poorly controlled for several years He was  referred here because of an A1c in 8/15 of 9.7%. He was on a regimen of Novolin 70/30 twice a day but because of inadequate control and higher readings later in the day he was switched to basal bolus insulin regimen in 12/15.  Recent history:   INSULIN regimen is: Humalog  ac meals 35-40-30 Toujeo 35 units daily  Previously on V-go basal 40 units daily with boluses 12 units at meals  Non-insulin hypoglycemic drugs: metformin 1 g twice a day, Trulicity 1.5 mg weekly     A1c has been generally over 8-9% and more recently 8.9 although previously with V-go pump was 7.1  Current management, blood sugar patterns and problems identified:  He does as poor control of his diabetes with average blood sugar over 200 and higher than the last visit when he was not taking his Toujeo  However does not appear to be following instructions.  The doses and is taking only 35 units of Toujeo instead of the 44 prescribed  Despite clear instructions he is not understanding to take Humalog with every single meal  He is only taking Toujeo in the morning and blood sugars are rising significantly by lunchtime because of lack of Humalog  At times will not take Humalog when he is eating out in the evening also  Lowest blood sugars are overnight occasionally when he takes extra  Humalog bedtime  Blood sugars after evening meal may be as high as 450  He still does not want to go back to the V-go pump because of the discomfort   Again he is saying that he tends to get hungry and is not watching his diet consistently eating more carbohydrate at mealtimes  Not benefiting much from taking a higher dose of Trulicity also which she is doing regularly        Side effects from medications have been: ?  Nausea/dizziness on Victoza  Compliance with the medical regimen: Fair  Hypoglycemia:   rarely, has only one low sugar at 1 AM  Glucose monitoring:  done 2-3 times a day         Glucometer:  One Touch ultra 2     Blood Glucose readings by time of day by review of meter download   FASTING average 185, highest 280 Lunchtime average 262 Bedtime average 266 with the range 1 67-450  Overall median 213   Self-care:  Not consistently controlling portions and may have unbalanced meals  Meals: 3 meals per day. Breakfast at 8-9 am is various kinds of bread, milk and sometimes eggs. Lunch generally larger meal Dinner at 6 pm     Dietician visit, most recent: 5/16 Last CDE visit: 12/2016  Exercise: Walking 1 mile, Weather permitting  Weight history: Previous range 200-220  Wt Readings from Last 3 Encounters:  06/02/17 212 lb 9.6 oz (96.4 kg)  05/17/17 210 lb 9.6 oz (95.5 kg)  04/18/17 212 lb 6.4 oz (96.3 kg)    Glycemic control:   Lab Results  Component Value Date   HGBA1C 8.9 (H) 05/17/2017   HGBA1C 7.1 02/15/2017   HGBA1C 8.0 09/16/2016   Lab Results  Component Value Date   MICROALBUR 1.1 04/29/2016   LDLCALC 23 05/17/2017   CREATININE 0.83 05/17/2017    No results found for: FRUCTOSAMINE     Allergies as of 06/02/2017   No Known Allergies     Medication List       Accurate as of 06/02/17  3:41 PM. Always use your most recent med list.          aspirin 325 MG tablet Take 81 mg by mouth daily.   atorvastatin 40 MG  tablet Commonly known as:  LIPITOR TAKE 1 TABLET BY MOUTH DAILY AND 1/2 TABLET AT BEDTIME   cholecalciferol 1000 units tablet Commonly known as:  VITAMIN D Take 1,000 Units by mouth daily. Reported on 11/05/2015   Dulaglutide 1.5 MG/0.5ML Sopn Commonly known as:  TRULICITY Inject weekly   glucose blood test strip Commonly known as:  ONE TOUCH ULTRA TEST TEST THREE TIMES DAILY   HUMALOG KWIKPEN 100 UNIT/ML KiwkPen Generic drug:  insulin lispro ADMINISTER 35 UNITS UNDER THE SKIN THREE TIMES DAILY BEFORE MEALS   Insulin Glargine 300 UNIT/ML Sopn Commonly known as:  TOUJEO SOLOSTAR Inject 44 Units into the skin daily.   Insulin Pen Needle 32G X 4 MM Misc Use 5 pens per day to inject insulin   B-D UF III MINI PEN NEEDLES 31G X 5 MM Misc Generic drug:  Insulin Pen Needle USE FIVE PER DAY AS DIRECTED   INSULIN SYRINGE .5CC/30GX5/16" 30G X 5/16" 0.5 ML Misc Use 3 times a day for insulin   levocetirizine 5 MG tablet Commonly known as:  XYZAL Take 1 tablet (5 mg total) by mouth every evening.   losartan 50 MG tablet Commonly known as:  COZAAR TAKE 1 TABLET(50 MG) BY MOUTH DAILY   meloxicam 7.5 MG tablet Commonly known as:  MOBIC TAKE 1 TO 2 TABLETS BY MOUTH EVERY DAY   metFORMIN 1000 MG tablet Commonly known as:  GLUCOPHAGE TAKE 1 TABLET BY MOUTH TWICE DAILY WITH MEALS   tamsulosin 0.4 MG Caps capsule Commonly known as:  FLOMAX Take 1 capsule (0.4 mg total) by mouth daily.   V-GO 40 Kit Use 1 device per day with insulin       Allergies: No Known Allergies  Past Medical History:  Diagnosis Date  . Blood transfusion without reported diagnosis   . Diabetes mellitus without complication ([place])   . History of ETOH abuse   . Hypertension     Past Surgical History:  Procedure Laterality Date  . BACK SURGERY N/A 2010    Family History  Problem Relation Age of Onset  . Diabetes Unknown   . Hypertension Unknown   . Arthritis Father   . Diabetes Father      Social History:  reports that he has quit smoking. He quit smokeless tobacco use about 23 years ago. He reports that he does not drink alcohol or use drugs.    Review of Systems         Lipids:  He has been on Lipitor 40 mg for  hypercholesterolemia since about 2011 good control Also on low-dose fenofibrate Lipids followed by PCP       Lab Results  Component Value Date   CHOL 80 05/17/2017   HDL 39.40 05/17/2017   LDLCALC 23 05/17/2017   LDLDIRECT 40.0 02/13/2016   TRIG 88.0 05/17/2017   CHOLHDL 2 05/17/2017       The blood pressure has been treated with losartan 50 mg, prescribed by PCP   Last foot exam was in 05/2017 done by PCP       Physical Examination:  BP 140/82   Pulse 72   Ht '5\' 3"'  (1.6 m)   Wt 212 lb 9.6 oz (96.4 kg)   SpO2 96%   BMI 37.66 kg/m      ASSESSMENT:  Diabetes type 2, uncontrolled with obesity and BMI of 36  See history of present illness for detailed discussion of current diabetes management, blood sugar patterns and problems identified.[name]  His A1c is recently 0.9 Although he did well with the V-go pump.  Does not like the way it was sticking He says he wants to try the full-fledged Medtronic pump but most likely this will not be approved by insurance and also unlikely that he can follow instructions to use it He has insulin resistance and also inconsistent diet with excessive carbohydrate intake Some of his hyperglycemia is related to not taking Humalog at breakfast and despite instructions normal was taking mealtime insulin before eating  Not clear if he is benefiting much from Trulicity and no recent weight loss  HYPERTENSION: Fair control  PLAN:  Discussed action of SGLT 2 drugs on lowering glucose by decreasing kidney absorption of glucose, benefits of weight loss and lower blood pressure, possible side effects including candidiasis and dosage regimen   He is a good candidate for a trial of Invokana 100 mg daily  Discussed that  if he is able to get better blood sugar control he can reduce his insulin dose  Also with this he would like to reduce his losartan to half tablet  For now he will continue the same insulin doses as prescribed but may need to reduce once blood sugars are improving  He will start taking Humalog at breakfast also along with Toujeo and explained rationale for doing this  Advised him not to take any correction doses of Humalog at bedtime  May consider follow-up with dietitian again  Continue to improve diet with lower carbohydrate intake  Patient education and instructions relayed through the interpreter today   Patient Instructions   HUMALOG AT meals 35 AM -40 LUNCH-35 DINNER  TOUJEO 40 UNITS  NO HUMALOG AT BEDTIME   Counseling time on subjects discussed above is over 50% of today's 25 minute visit   Carigan Lister 06/02/2017, 3:41 PM   Note: This office note was prepared with Dragon voice recognition system technology. Any transcriptional errors that result from this process are unintentional.

## 2017-06-02 NOTE — Patient Instructions (Addendum)
HUMALOG AT meals 35 AM -40 LUNCH-35 DINNER  TOUJEO 40 UNITS  NO HUMALOG AT BEDTIME    [facility]

## 2017-06-09 ENCOUNTER — Other Ambulatory Visit: Payer: Self-pay | Admitting: Medical

## 2017-06-09 NOTE — Telephone Encounter (Signed)
Left pt a message to call back to discuss refill request.

## 2017-06-12 ENCOUNTER — Other Ambulatory Visit: Payer: Self-pay | Admitting: Medical

## 2017-06-24 ENCOUNTER — Other Ambulatory Visit: Payer: Self-pay | Admitting: Endocrinology

## 2017-07-06 ENCOUNTER — Other Ambulatory Visit: Payer: Self-pay | Admitting: Medical

## 2017-07-13 ENCOUNTER — Other Ambulatory Visit: Payer: Self-pay | Admitting: Endocrinology

## 2017-07-19 ENCOUNTER — Ambulatory Visit (INDEPENDENT_AMBULATORY_CARE_PROVIDER_SITE_OTHER): Payer: Medicare Other | Admitting: Endocrinology

## 2017-07-19 ENCOUNTER — Telehealth: Payer: Self-pay | Admitting: Medical

## 2017-07-19 ENCOUNTER — Encounter: Payer: Self-pay | Admitting: Endocrinology

## 2017-07-19 VITALS — BP 130/78 | HR 73 | Ht 63.0 in | Wt 205.6 lb

## 2017-07-19 DIAGNOSIS — E1165 Type 2 diabetes mellitus with hyperglycemia: Secondary | ICD-10-CM

## 2017-07-19 DIAGNOSIS — Z794 Long term (current) use of insulin: Secondary | ICD-10-CM | POA: Diagnosis not present

## 2017-07-19 NOTE — Patient Instructions (Addendum)
Take 38 units Toujeo  If sugar low at lunch a lot, then 30 Humalog in am  Try leaving off Trulicity for 2 weeks  Check blood sugars on waking up  5/7  Also check blood sugars about 2 hours after a meal and do this after different meals by rotation  Recommended blood sugar levels on waking up is 90-130 and about 2 hours after meal is 130-160  Please bring your blood sugar monitor to each visit, thank you

## 2017-07-19 NOTE — Progress Notes (Signed)
Patient ID: Samuel [name], male   DOB: [birth year], 66 y.o.   MRN: [id number]            Reason for Appointment:  Followup for Type 2 Diabetes  Referring physician: Saguier  History of Present Illness:          Diagnosis: Type 2 diabetes mellitus, date of diagnosis:  1990      Past history: He thinks he has been on insulin for the last 10-12 years. Previously had been on metformin which has been continued. He was probably tried on different insulin regimens initially but has been taking 70/30 mostly because of cost Previous records are not available and appears that his sugars have been poorly controlled for several years He was  referred here because of an A1c in 8/15 of 9.7%. He was on a regimen of Novolin 70/30 twice a day but because of inadequate control and higher readings later in the day he was switched to basal bolus insulin regimen in 12/15.  Recent history:   INSULIN regimen is: Humalog  ac meals 35-40-30 Toujeo 35 units daily  Previously on V-go basal 40 units daily with boluses 12 units at meals  Non-insulin hypoglycemic drugs: metformin 1 g twice a day, Trulicity 1.5 mg weekly Invokana 100 mg daily     A1c has been generally over 8-9% and more recently 8.9 in July Best A1c 7.1 on the V-go pump  Current management, blood sugar patterns and problems identified:  He does have much better blood sugar control compared to the last visit with starting Invokana, previously average blood sugar at home was 213  He has no side effects from this  Also was restarted on HUMALOG at breakfast which she was not taking  FASTING blood sugars are overall fairly good but recently consistently higher  Also has variably high readings at suppertime  If he takes his insulin consistently before eating supper his blood sugars are better but otherwise they may be as high as 298  Although he was told not to take any Humalog late at night he misses the dose he does sporadically do this and  was caused his blood sugar to be 58 at midnight  He thinks he can control his portions better with Trulicity and wants to continue this also  He has lost 7 pounds  Also thinks that he is trying to do a little better with his diet and increasing his walking        Side effects from medications have been: ?  Nausea/dizziness on Victoza  Compliance with the medical regimen: Fair  Hypoglycemia:   rarely, has only one low sugar at 1 AM  Glucose monitoring:  done 2-3 times a day         Glucometer:  One Touch ultra 2     Blood Glucose readings by time of day by review of meter download   Mean values apply above for all meters except median for One Touch  PRE-MEAL Fasting Lunch Dinner Bedtime Overall  Glucose range: 10 6-212    230-298    Mean/median: Are 1  147  174  248  153+/-45    POST-MEAL PC Breakfast PC Lunch PC Dinner  Glucose range:    73-5   Mean/median:   144      Self-care:  Not consistently controlling portions and may have unbalanced meals  Meals: 3 meals per day. Breakfast at 8-9 am is various kinds of bread, milk and sometimes eggs. Lunch generally  larger meal Dinner at 6 pm     Dietician visit, most recent: 5/16 Last CDE visit: 12/2016                 Exercise: Walking 1+ mile, Weather permitting  Weight history: Previous range 200-220  Wt Readings from Last 3 Encounters:  07/19/17 205 lb 9.6 oz (93.3 kg)  06/02/17 212 lb 9.6 oz (96.4 kg)  05/17/17 210 lb 9.6 oz (95.5 kg)    Glycemic control:   Lab Results  Component Value Date   HGBA1C 8.9 (H) 05/17/2017   HGBA1C 7.1 02/15/2017   HGBA1C 8.0 09/16/2016   Lab Results  Component Value Date   MICROALBUR 1.1 04/29/2016   LDLCALC 23 05/17/2017   CREATININE 0.83 05/17/2017    No results found for: FRUCTOSAMINE     Allergies as of 07/19/2017   No Known Allergies     Medication List       Accurate as of 07/19/17  9:03 PM. Always use your most recent med list.          aspirin 325 MG  tablet Take 81 mg by mouth daily.   atorvastatin 40 MG tablet Commonly known as:  LIPITOR TAKE 1 TABLET BY MOUTH DAILY AND 1/2 TABLET AT BEDTIME   canagliflozin 100 MG Tabs tablet Commonly known as:  INVOKANA 1 tablet before breakfast   cholecalciferol 1000 units tablet Commonly known as:  VITAMIN D Take 1,000 Units by mouth daily. Reported on 11/05/2015   Dulaglutide 1.5 MG/0.5ML Sopn Commonly known as:  TRULICITY Inject weekly   TRULICITY 3.53 IR/4.4RX Sopn Generic drug:  Dulaglutide INJECT 0.75 MG INTO THE SKIN ONCE A WEEK   glucose blood test strip Commonly known as:  ONE TOUCH ULTRA TEST TEST THREE TIMES DAILY   HUMALOG KWIKPEN 100 UNIT/ML KiwkPen Generic drug:  insulin lispro ADMINISTER 35 UNITS UNDER THE SKIN THREE TIMES DAILY BEFORE MEALS   Insulin Glargine 300 UNIT/ML Sopn Commonly known as:  TOUJEO SOLOSTAR Inject 44 Units into the skin daily.   Insulin Pen Needle 32G X 4 MM Misc Use 5 pens per day to inject insulin   B-D UF III MINI PEN NEEDLES 31G X 5 MM Misc Generic drug:  Insulin Pen Needle USE FIVE PER DAY AS DIRECTED   INSULIN SYRINGE .5CC/30GX5/16" 30G X 5/16" 0.5 ML Misc Use 3 times a day for insulin   levocetirizine 5 MG tablet Commonly known as:  XYZAL TAKE 1 TABLET BY MOUTH EVERY EVENING   losartan 50 MG tablet Commonly known as:  COZAAR TAKE 1 TABLET(50 MG) BY MOUTH DAILY   losartan 50 MG tablet Commonly known as:  COZAAR TAKE 1 TABLET(50 MG) BY MOUTH DAILY   meloxicam 7.5 MG tablet Commonly known as:  MOBIC TAKE 1 TO 2 TABLETS BY MOUTH EVERY DAY   metFORMIN 1000 MG tablet Commonly known as:  GLUCOPHAGE TAKE 1 TABLET BY MOUTH TWICE DAILY WITH MEALS   tamsulosin 0.4 MG Caps capsule Commonly known as:  FLOMAX Take 1 capsule (0.4 mg total) by mouth daily.   V-GO 40 Kit Use 1 device per day with insulin       Allergies: No Known Allergies  Past Medical History:  Diagnosis Date  . Blood transfusion without reported  diagnosis   . Diabetes mellitus without complication ([place])   . History of ETOH abuse   . Hypertension     Past Surgical History:  Procedure Laterality Date  . BACK SURGERY N/A 2010  Family History  Problem Relation Age of Onset  . Diabetes Unknown   . Hypertension Unknown   . Arthritis Father   . Diabetes Father     Social History:  reports that he has quit smoking. He quit smokeless tobacco use about 23 years ago. He reports that he does not drink alcohol or use drugs.    Review of Systems         Lipids:  He has been on Lipitor 40 mg for hypercholesterolemia since about 2011, LDL levels shows good control Also on low-dose fenofibrate Lipids followed by PCP       Lab Results  Component Value Date   CHOL 80 05/17/2017   HDL 39.40 05/17/2017   LDLCALC 23 05/17/2017   LDLDIRECT 40.0 02/13/2016   TRIG 88.0 05/17/2017   CHOLHDL 2 05/17/2017       The blood pressure has been treated with losartan 50 mg, prescribed by PCP No lightheadedness   Last foot exam was in 05/2017 done by PCP       Physical Examination:  BP 130/78   Pulse 73   Ht _0  (1.6 m)   Wt 205 lb 9.6 oz (93.3 kg)   SpO2 96%   BMI 36.42 kg/m      ASSESSMENT:  Diabetes type 2, uncontrolled with obesity and BMI of 36  See history of present illness for detailed discussion of current diabetes management, blood sugar patterns and problems identified..  Blood sugars are significantly better with adding Invokana to his basal bolus insulin regimen and Trulicity He has lost weight He is somewhat more motivated also to watch his diet and exercise However can do better with taking his suppertime insulin more consistently before eating He wants to continue Trulicity for benefits on appetite suppression   HYPERTENSION: Better controlled with adding Invokana  PLAN:  He will have labs checked at his PCP office TOUJEO will be increased by 4 units He may need to reduce his Humalog at breakfast if  blood sugars start getting relatively low at lunchtime Most likely suppertime sugars will be better with increasing Toujeo otherwise will increase lunchtime coverage Consistent mealtime insulin at suppertime and more monitoring after supper  Patient education and instructions relayed through the interpreter today   Patient Instructions  Take 38 units Toujeo  If sugar low at lunch a lot, then 30 Humalog in am  Try leaving off Trulicity for 2 weeks  Check blood sugars on waking up  5/7  Also check blood sugars about 2 hours after a meal and do this after different meals by rotation  Recommended blood sugar levels on waking up is 90-130 and about 2 hours after meal is 130-160  Please bring your blood sugar monitor to each visit, thank you       [facility] 07/19/2017, 9:03 PM   Note: This office note was prepared with Dragon voice recognition system technology. Any transcriptional errors that result from this process are unintentional.

## 2017-07-19 NOTE — Telephone Encounter (Signed)
Dr. Remus BlakeKumar's office does not have phlebotomists this week. He indicated patient may come in for labs that he ordered. Please see those labs.

## 2017-07-20 ENCOUNTER — Other Ambulatory Visit: Payer: Medicare Other

## 2017-07-20 ENCOUNTER — Other Ambulatory Visit (INDEPENDENT_AMBULATORY_CARE_PROVIDER_SITE_OTHER): Payer: Medicare Other

## 2017-07-20 DIAGNOSIS — Z794 Long term (current) use of insulin: Secondary | ICD-10-CM

## 2017-07-20 DIAGNOSIS — E1165 Type 2 diabetes mellitus with hyperglycemia: Secondary | ICD-10-CM

## 2017-07-20 LAB — MICROALBUMIN / CREATININE URINE RATIO
CREATININE, U: 116.1 mg/dL
MICROALB UR: 0.8 mg/dL (ref 0.0–1.9)
MICROALB/CREAT RATIO: 0.7 mg/g (ref 0.0–30.0)

## 2017-07-20 LAB — BASIC METABOLIC PANEL
BUN: 19 mg/dL (ref 6–23)
CHLORIDE: 100 meq/L (ref 96–112)
CO2: 27 meq/L (ref 19–32)
Calcium: 9.8 mg/dL (ref 8.4–10.5)
Creatinine, Ser: 0.88 mg/dL (ref 0.40–1.50)
GFR: 92.08 mL/min (ref >60.00)
Glucose, Bld: 161 mg/dL — ABNORMAL HIGH (ref 70–99)
POTASSIUM: 4.3 meq/L (ref 3.5–5.1)
SODIUM: 138 meq/L (ref 135–145)

## 2017-07-20 NOTE — Telephone Encounter (Signed)
Patient labs drawn this morning.

## 2017-07-21 ENCOUNTER — Telehealth: Payer: Self-pay | Admitting: *Deleted

## 2017-07-21 LAB — FRUCTOSAMINE: Fructosamine: 302 umol/L — ABNORMAL HIGH (ref 0–285)

## 2017-07-21 NOTE — Telephone Encounter (Signed)
Received Abnormal results from [facility]; forwarded to provider/SLS 09/13

## 2017-07-25 ENCOUNTER — Other Ambulatory Visit: Payer: Self-pay | Admitting: Endocrinology

## 2017-07-25 DIAGNOSIS — E119 Type 2 diabetes mellitus without complications: Secondary | ICD-10-CM

## 2017-07-27 ENCOUNTER — Ambulatory Visit (INDEPENDENT_AMBULATORY_CARE_PROVIDER_SITE_OTHER): Payer: Medicare Other | Admitting: Medical

## 2017-07-27 ENCOUNTER — Encounter: Payer: Self-pay | Admitting: Medical

## 2017-07-27 VITALS — BP 120/68 | HR 62 | Temp 98.2°F | Resp 16 | Ht 63.0 in | Wt 206.6 lb

## 2017-07-27 DIAGNOSIS — H00015 Hordeolum externum left lower eyelid: Secondary | ICD-10-CM

## 2017-07-27 MED ORDER — TOBRAMYCIN 0.3 % OP SOLN: 2.0000 [drp] | Freq: Four times a day (QID) | OPHTHALMIC | 0 refills | Status: DC

## 2017-07-27 NOTE — Patient Instructions (Signed)
You appear to have stye to your left eye. Will rx tobrex eye drops daily(also warm compresses twice daily). Area should gradually resolve. If not better by Monday morning please notify us and can refer you to specialist.   If area worsens or change notify us as well.

## 2017-07-27 NOTE — Progress Notes (Signed)
Subjective:    Patient ID: Samuel [name], male    DOB: Feb 24, 1951, 66 y.o.   MRN: [id number]  HPI  Pt in with left lower eye lid mild swollen area for one week. Swollen described long was larger before. No dc since onset.. No trauma. No vision changes or eye pain.  Over the last 2 days the area has gone down a little bit but is still present. Patient never had this before.  Pt has been using visine otc eye drop.  Pt never had this before.   Review of Systems  Constitutional: Negative for chills, fatigue and fever.  Eyes: Negative for photophobia, pain, discharge, redness, itching and visual disturbance.       Lower lid left side small lump/stye-like  Respiratory: Negative for cough, chest tightness, shortness of breath and wheezing.   Cardiovascular: Negative for chest pain and palpitations.  Neurological: Negative for dizziness, light-headedness, numbness and headaches.  Hematological: Negative for adenopathy. Does not bruise/bleed easily.  Psychiatric/Behavioral: Negative for behavioral problems and confusion.    Past Medical History:  Diagnosis Date  . Blood transfusion without reported diagnosis   . Diabetes mellitus without complication ([place])   . History of ETOH abuse   . Hypertension      Social History   Social History  . Marital status: Married    Spouse name: N/A  . Number of children: N/A  . Years of education: N/A   Occupational History  . Not on file.   Social History Main Topics  . Smoking status: Former Research scientist (life sciences)  . Smokeless tobacco: Former Systems developer    Quit date: 11/08/1993  . Alcohol use No  . Drug use: No  . Sexual activity: Not on file   Other Topics Concern  . Not on file   Social History Narrative  . No narrative on file    Past Surgical History:  Procedure Laterality Date  . BACK SURGERY N/A 2010    Family History  Problem Relation Age of Onset  . Diabetes Unknown   . Hypertension Unknown   . Arthritis Father   . Diabetes Father      No Known Allergies  Current Outpatient Prescriptions on File Prior to Visit  Medication Sig Dispense Refill  . aspirin 325 MG tablet Take 81 mg by mouth daily.     [name] atorvastatin (LIPITOR) 40 MG tablet TAKE 1 TABLET BY MOUTH DAILY AND 1/2 TABLET AT BEDTIME 45 tablet 5  . B-D UF III MINI PEN NEEDLES 31G X 5 MM MISC USE FIVE PER DAY AS DIRECTED 200 each 0  . canagliflozin (INVOKANA) 100 MG TABS tablet 1 tablet before breakfast 30 tablet 3  . cholecalciferol (VITAMIN D) 1000 UNITS tablet Take 1,000 Units by mouth daily. Reported on 11/05/2015    . Dulaglutide (TRULICITY) 1.5 HA/1.9FX SOPN Inject weekly 4 pen 5  . glucose blood (ONE TOUCH ULTRA TEST) test strip TEST THREE TIMES DAILY 300 each 1  . HUMALOG KWIKPEN 100 UNIT/ML KiwkPen ADMINISTER 35 UNITS UNDER THE SKIN THREE TIMES DAILY BEFORE MEALS 96 mL 1  . Insulin Disposable Pump (V-GO 40) KIT Use 1 device per day with insulin 30 kit 3  . Insulin Glargine (TOUJEO SOLOSTAR) 300 UNIT/ML SOPN Inject 44 Units into the skin daily. (Patient taking differently: Inject 35 Units into the skin daily. ) 3 pen 3  . Insulin Pen Needle 32G X 4 MM MISC Use 5 pens per day to inject insulin 200 each 3  . Insulin Syringe-Needle  U-100 (INSULIN SYRINGE .5CC/30GX5/16") 30G X 5/16" 0.5 ML MISC Use 3 times a day for insulin 100 each 1  . levocetirizine (XYZAL) 5 MG tablet TAKE 1 TABLET BY MOUTH EVERY EVENING 90 tablet 0  . losartan (COZAAR) 50 MG tablet TAKE 1 TABLET(50 MG) BY MOUTH DAILY 90 tablet 1  . losartan (COZAAR) 50 MG tablet TAKE 1 TABLET(50 MG) BY MOUTH DAILY 90 tablet 0  . meloxicam (MOBIC) 7.5 MG tablet TAKE 1 TO 2 TABLETS BY MOUTH EVERY DAY 90 tablet 0  . metFORMIN (GLUCOPHAGE) 1000 MG tablet TAKE 1 TABLET BY MOUTH TWICE DAILY WITH MEALS 180 tablet 0  . tamsulosin (FLOMAX) 0.4 MG CAPS capsule Take 1 capsule (0.4 mg total) by mouth daily. 30 capsule 3  . TRULICITY 3.84 YK/5.9DJ SOPN INJECT 0.75 MG INTO THE SKIN ONCE A WEEK 4 mL 0   No current  facility-administered medications on file prior to visit.     BP 120/68   Pulse 62   Temp 98.2 F (36.8 C) (Oral)   Resp 16   Ht _0  (1.6 m)   Wt 206 lb 9.6 oz (93.7 kg)   SpO2 97%   BMI 36.60 kg/m       Objective:   Physical Exam General- no acute distress. Pleasant patient. Eyes-PERRL bilaterally. Conjunctiva clear bilaterally. No discharge. EOMs intact. Left eye lower-small swollen area inner lid/at edge of eyelash.      Assessment & Plan:  You appear to have stye to your left eye. Will rx tobrex eye drops daily(also warm compresses twice daily). Area should gradually resolve. If not better by Monday morning please notify us and can refer you to specialist.   If area worsens or change notify us as well.  Olamide Lahaie, Percell Miller, PA-C

## 2017-08-16 ENCOUNTER — Other Ambulatory Visit: Payer: Self-pay | Admitting: Endocrinology

## 2017-08-17 ENCOUNTER — Encounter: Payer: Self-pay | Admitting: Medical

## 2017-08-17 ENCOUNTER — Ambulatory Visit (INDEPENDENT_AMBULATORY_CARE_PROVIDER_SITE_OTHER): Payer: Medicare Other | Admitting: Medical

## 2017-08-17 ENCOUNTER — Other Ambulatory Visit: Payer: Self-pay | Admitting: Medical

## 2017-08-17 VITALS — BP 110/68 | HR 68 | Temp 98.2°F | Ht 64.0 in | Wt 203.0 lb

## 2017-08-17 DIAGNOSIS — E119 Type 2 diabetes mellitus without complications: Secondary | ICD-10-CM | POA: Diagnosis not present

## 2017-08-17 DIAGNOSIS — Z794 Long term (current) use of insulin: Secondary | ICD-10-CM

## 2017-08-17 DIAGNOSIS — G8929 Other chronic pain: Secondary | ICD-10-CM

## 2017-08-17 DIAGNOSIS — I10 Essential (primary) hypertension: Secondary | ICD-10-CM | POA: Diagnosis not present

## 2017-08-17 DIAGNOSIS — M5441 Lumbago with sciatica, right side: Secondary | ICD-10-CM

## 2017-08-17 DIAGNOSIS — E785 Hyperlipidemia, unspecified: Secondary | ICD-10-CM

## 2017-08-17 DIAGNOSIS — M5442 Lumbago with sciatica, left side: Secondary | ICD-10-CM

## 2017-08-17 LAB — CBC WITH DIFFERENTIAL/PLATELET
Basophils Absolute: 0 10*3/uL (ref 0.0–0.1)
Basophils Relative: 0.5 % (ref 0.0–3.0)
EOS PCT: 2 % (ref 0.0–5.0)
Eosinophils Absolute: 0.1 10*3/uL (ref 0.0–0.7)
HEMATOCRIT: 46.3 % (ref 39.0–52.0)
HEMOGLOBIN: 15.5 g/dL (ref 13.0–17.0)
LYMPHS PCT: 31.2 % (ref 12.0–46.0)
Lymphs Abs: 1.8 10*3/uL (ref 0.7–4.0)
MCHC: 33.5 g/dL (ref 30.0–36.0)
MCV: 88.5 fl (ref 78.0–100.0)
MONOS PCT: 7 % (ref 3.0–12.0)
Monocytes Absolute: 0.4 10*3/uL (ref 0.1–1.0)
Neutro Abs: 3.4 10*3/uL (ref 1.4–7.7)
Neutrophils Relative %: 59.3 % (ref 43.0–77.0)
Platelets: 204 10*3/uL (ref 150.0–400.0)
RBC: 5.23 Mil/uL (ref 4.22–5.81)
RDW: 14.3 % (ref 11.5–15.5)
WBC: 5.7 10*3/uL (ref 4.0–10.5)

## 2017-08-17 LAB — COMPREHENSIVE METABOLIC PANEL
ALBUMIN: 4.4 g/dL (ref 3.5–5.2)
ALK PHOS: 43 U/L (ref 39–117)
ALT: 25 U/L (ref 0–53)
AST: 20 U/L (ref 0–37)
BILIRUBIN TOTAL: 0.8 mg/dL (ref 0.2–1.2)
BUN: 20 mg/dL (ref 6–23)
CO2: 32 mEq/L (ref 19–32)
Calcium: 9.5 mg/dL (ref 8.4–10.5)
Chloride: 101 mEq/L (ref 96–112)
Creatinine, Ser: 0.81 mg/dL (ref 0.40–1.50)
GFR: 101.29 mL/min (ref >60.00)
Glucose, Bld: 161 mg/dL — ABNORMAL HIGH (ref 70–99)
Potassium: 4.6 mEq/L (ref 3.5–5.1)
Sodium: 138 mEq/L (ref 135–145)
TOTAL PROTEIN: 7 g/dL (ref 6.0–8.3)

## 2017-08-17 LAB — LIPID PANEL
CHOLESTEROL: 93 mg/dL (ref 0–200)
HDL: 41.4 mg/dL (ref >39.00)
LDL Cholesterol: 29 mg/dL (ref 0–99)
NonHDL: 51.5
Total CHOL/HDL Ratio: 2
Triglycerides: 113 mg/dL (ref 0.0–149.0)
VLDL: 22.6 mg/dL (ref 0.0–40.0)

## 2017-08-17 MED ORDER — LOSARTAN POTASSIUM 50 MG PO TABS: ORAL_TABLET | ORAL | 1 refills | Status: DC

## 2017-08-17 NOTE — Patient Instructions (Addendum)
Your blood pressure is well controlled today and I'm refilling Cozaar prescription.  For your diabetes continue to follow up with Dr. Lucianne Muss and he will get A1c.  For high cholesterol history will recheck your lipid panel today.  For your history of intermittent back pain would advise that you continue meloxicam but use sparingly 1-3 times at night if needed to help you sleep.  If the pain is more constant there other types of medications I can give you and could refer you to another specialist.  Follow-up in 3 months or as needed.

## 2017-08-17 NOTE — Progress Notes (Signed)
Subjective:    Patient ID: Samuel [name], male    DOB: 09-28-1951, 66 y.o.   MRN: [id number]  HPI  Pt in for follow up.  Pt just got his flu vaccine.  Pt states he feels good today.  Pt reminds me that he occasionally has pain radiating to his lower legs. Pain comes and goes but not constant(no saddle anesthesia, no incontinence and no leg weakness). Pt had mri in past and I had referred him to specialist in past. Pt states low level pain. But at times can't sleep due to pain. (in past used gabapentin but then was dc'd 2 years ago.) Pt at times uses meloxicam and it does help. But does help when use it.  Pt still seeing [name] for diabetes. Pt last a1c was 8.9. Pt has appointment with [name] in November.   Pt has high cholesterol but 3 months ago lipids controlled.  Pt bp is controlled today and he is about to run out of meds.    Review of Systems  Constitutional: Negative for chills, fatigue and fever.  Respiratory: Negative for chest tightness, shortness of breath and stridor.   Cardiovascular: Negative for chest pain and palpitations.  Gastrointestinal: Negative for abdominal distention, abdominal pain, constipation, diarrhea and vomiting.  Genitourinary: Negative for decreased urine volume, dysuria, flank pain, frequency, penile swelling and urgency.  Musculoskeletal: Positive for back pain. Negative for arthralgias, joint swelling, myalgias and neck stiffness.  Skin: Negative for rash.  Neurological: Negative for dizziness, speech difficulty and weakness.  Hematological: Negative for adenopathy. Does not bruise/bleed easily.  Psychiatric/Behavioral: Negative for behavioral problems, decreased concentration, dysphoric mood, hallucinations and suicidal ideas. The patient is not nervous/anxious.     Past Medical History:  Diagnosis Date  . Blood transfusion without reported diagnosis   . Diabetes mellitus without complication ([place])   . History of ETOH abuse   .  Hypertension      Social History   Social History  . Marital status: Married    Spouse name: N/A  . Number of children: N/A  . Years of education: N/A   Occupational History  . Not on file.   Social History Main Topics  . Smoking status: Former Research scientist (life sciences)  . Smokeless tobacco: Former Systems developer    Quit date: 11/08/1993  . Alcohol use No  . Drug use: No  . Sexual activity: Not on file   Other Topics Concern  . Not on file   Social History Narrative  . No narrative on file    Past Surgical History:  Procedure Laterality Date  . BACK SURGERY N/A 2010    Family History  Problem Relation Age of Onset  . Diabetes Unknown   . Hypertension Unknown   . Arthritis Father   . Diabetes Father     No Known Allergies  Current Outpatient Prescriptions on File Prior to Visit  Medication Sig Dispense Refill  . aspirin 325 MG tablet Take 81 mg by mouth daily.     [name] atorvastatin (LIPITOR) 40 MG tablet TAKE 1 TABLET BY MOUTH DAILY AND 1/2 TABLET AT BEDTIME 45 tablet 5  . B-D UF III MINI PEN NEEDLES 31G X 5 MM MISC USE FIVE PER DAY AS DIRECTED 200 each 0  . canagliflozin (INVOKANA) 100 MG TABS tablet 1 tablet before breakfast 30 tablet 3  . cholecalciferol (VITAMIN D) 1000 UNITS tablet Take 1,000 Units by mouth daily. Reported on 11/05/2015    . Dulaglutide (TRULICITY) 1.5 WU/9.8JX SOPN Inject  weekly 4 pen 5  . glucose blood (ONE TOUCH ULTRA TEST) test strip TEST THREE TIMES DAILY 300 each 1  . HUMALOG KWIKPEN 100 UNIT/ML KiwkPen ADMINISTER 35 UNITS UNDER THE SKIN THREE TIMES DAILY BEFORE MEALS 96 mL 1  . Insulin Disposable Pump (V-GO 40) KIT Use 1 device per day with insulin 30 kit 3  . Insulin Glargine (TOUJEO SOLOSTAR) 300 UNIT/ML SOPN Inject 44 Units into the skin daily. (Patient taking differently: Inject 35 Units into the skin daily. ) 3 pen 3  . Insulin Pen Needle 32G X 4 MM MISC Use 5 pens per day to inject insulin 200 each 3  . Insulin Syringe-Needle U-100 (INSULIN SYRINGE  .5CC/30GX5/16") 30G X 5/16" 0.5 ML MISC Use 3 times a day for insulin 100 each 1  . levocetirizine (XYZAL) 5 MG tablet TAKE 1 TABLET BY MOUTH EVERY EVENING 90 tablet 0  . losartan (COZAAR) 50 MG tablet TAKE 1 TABLET(50 MG) BY MOUTH DAILY 90 tablet 1  . losartan (COZAAR) 50 MG tablet TAKE 1 TABLET(50 MG) BY MOUTH DAILY 90 tablet 0  . meloxicam (MOBIC) 7.5 MG tablet TAKE 1 TO 2 TABLETS BY MOUTH EVERY DAY 90 tablet 0  . metFORMIN (GLUCOPHAGE) 1000 MG tablet TAKE 1 TABLET BY MOUTH TWICE DAILY WITH MEALS 180 tablet 0  . tamsulosin (FLOMAX) 0.4 MG CAPS capsule Take 1 capsule (0.4 mg total) by mouth daily. 30 capsule 3  . tobramycin (TOBREX) 0.3 % ophthalmic solution Place 2 drops into the left eye every 6 (six) hours. 5 mL 0  . TRULICITY 9.92 FC/1.4QH SOPN INJECT 0.75 MG INTO THE SKIN ONCE A WEEK 4 mL 0   No current facility-administered medications on file prior to visit.     BP 110/68   Pulse 68   Temp 98.2 F (36.8 C) (Oral)   Ht '5\' 4"'  (1.626 m)   Wt 203 lb (92.1 kg)   SpO2 98%   BMI 34.84 kg/m      Objective:   Physical Exam  General Appearance- Not in acute distress.   Chest and Lung Exam Auscultation: Breath sounds:-Normal. Clear even and unlabored. Adventitious sounds:- No Adventitious sounds.  Cardiovascular Auscultation:Rythm - Regular, rate and rythm. Heart Sounds -Normal heart sounds.  Abdomen Inspection:-Inspection Normal.  Palpation/Perucssion: Palpation and Percussion of the abdomen reveal- Non Tender, No Rebound tenderness, No rigidity(Guarding) and No Palpable abdominal masses.  Liver:-Normal.  Spleen:- Normal.   Back No Mid lumbar spine tenderness to palpation. No obvious pain on lateral movements and flexion/extension of the spine.  Lower ext neurologic  L5-S1 sensation intact bilaterally. Normal patellar reflexes bilaterally. No foot drop bilaterally.      Assessment & Plan:  Your blood pressure is well controlled today and I'm refilling Cozaar  prescription.  For your diabetes continue to follow up with [name] and he will get A1c.  For high cholesterol history will recheck your lipid panel today.  For your history of intermittent back pain would advise that you continue meloxicam but use sparingly 1-3 times at night if needed to help you sleep.  If the pain is more constant there other types of medications I can give you and could refer you to another specialist.  Follow-up in 3 months or as needed.  Lazarus Sudbury, Percell Miller, PA-C

## 2017-08-18 ENCOUNTER — Telehealth: Payer: Self-pay | Admitting: Medical

## 2017-08-18 NOTE — Telephone Encounter (Signed)
-----   Message from Esperanza Richters, PA-C sent at 08/17/2017  9:26 PM EDT ----- Good kidney function. Normal liver enzymes. Good cholesterol levels/good levels. No anemia and normal infection cells. Follow up in 3 months. Stay on same meds.

## 2017-08-18 NOTE — Telephone Encounter (Signed)
LVM for pt to return call to inform him his lab results.

## 2017-08-22 ENCOUNTER — Other Ambulatory Visit: Payer: Self-pay | Admitting: Endocrinology

## 2017-08-25 ENCOUNTER — Other Ambulatory Visit: Payer: Self-pay | Admitting: Endocrinology

## 2017-09-18 NOTE — Progress Notes (Signed)
Patient ID: Samuel [name], male   DOB: [birth year], 66 y.o.   MRN: [id number]            Reason for Appointment:  Followup for Type 2 Diabetes  Referring physician: S[place]ier  History of Present Illness:          Diagnosis: Type 2 diabetes mellitus, date of diagnosis:  1990      Past history: He thinks he has been on insulin for the last 10-12 years. Previously had been on metformin which has been continued. He was probably tried on different insulin regimens initially but has been taking 70/30 mostly because of cost Previous records are not available and appears that his sugars have been poorly controlled for several years He was  referred here because of an A1c in 8/15 of 9.7%. He was on a regimen of Novolin 70/30 twice a day but because of inadequate control and higher readings later in the day he was switched to basal bolus insulin regimen in 12/15.  Recent history:   INSULIN regimen is: Humalog  ac meals 35-40-30 Toujeo 35-40 units daily  Previously on V-go basal 40 units daily with boluses 12 units at meals  Non-insulin hypoglycemic drugs: metformin 1 g twice a day, Trulicity 1.5 mg weekly Invokana 100 mg daily     A1c has been generally over 8-9% although 7.1 in April His A1c is back up to 7.6  Current management, blood sugar patterns and problems identified:  He does some improvement in his blood sugars with adding Invokana although he is having significant postprandial blood hyperglycemia now  Despite repeated discussion on dosage and timing of insulin he is still not following instructions instructions  He now takes his Toujeo at lunchtime instead of breakfast and does not take any Humalog to cover his lunch  He thinks he is adjusting his Toujeo based on his blood sugar level but checks his blood sugar at lunch only occasionally, probably taking 40 units on the days his blood sugars are high with only a few readings within target in the morning  Blood sugars are generally  much higher in the afternoon and evening  Also despite repeated reminders he does not take his insulin with him when he is going out to eat  If he forgets his insulin before eating he may take it 20-30 minutes later  Is not walking much because of knee pain        Side effects from medications have been: ?  Nausea/dizziness on Victoza  Compliance with the medical regimen: Fair  Hypoglycemia:   rarely, has only one low sugar at 1 AM  Glucose monitoring:  done 2-3 times a day         Glucometer:  One Touch ultra 2     Blood Glucose readings by time of day by review of meter download   Mean values apply above for all meters except median for One Touch  PRE-MEAL Fasting Lunch Dinner Bedtime Overall  Glucose range: 96-20 14    221-3 01    Mean/median: 162 152  193  248  163   POST-MEAL PC Breakfast PC Lunch PC Dinner  Glucose range:  246, 286    Mean/median:  266       Self-care:  Not consistently controlling portions and may have unbalanced meals  Meals: 3 meals per day. Breakfast at 8-9 am is various kinds of bread, milk and sometimes eggs. Lunch generally larger meal Dinner at 6 pm  Dietician visit, most recent: 5/16 Last CDE visit: 12/2016                 Exercise: Walking some  Weight history: Previous range 200-220  Wt Readings from Last 3 Encounters:  09/19/17 205 lb (93 kg)  08/17/17 203 lb (92.1 kg)  07/27/17 206 lb 9.6 oz (93.7 kg)    Glycemic control:   Lab Results  Component Value Date   HGBA1C 8.9 (H) 05/17/2017   HGBA1C 7.1 02/15/2017   HGBA1C 8.0 09/16/2016   Lab Results  Component Value Date   MICROALBUR 0.8 07/20/2017   LDLCALC 29 08/17/2017   CREATININE 0.81 08/17/2017    Lab Results  Component Value Date   FRUCTOSAMINE 302 (H) 07/20/2017       Allergies as of 09/19/2017   No Known Allergies     Medication List        Accurate as of 09/19/17  8:47 AM. Always use your most recent med list.          aspirin 325 MG  tablet Take 81 mg by mouth daily.   atorvastatin 40 MG tablet Commonly known as:  LIPITOR TAKE 1 TABLET BY MOUTH DAILY AND 1/2 TABLET AT BEDTIME   canagliflozin 100 MG Tabs tablet Commonly known as:  INVOKANA 1 tablet before breakfast   cholecalciferol 1000 units tablet Commonly known as:  VITAMIN D Take 1,000 Units by mouth daily. Reported on 11/05/2015   Dulaglutide 1.5 MG/0.5ML Sopn Commonly known as:  TRULICITY Inject weekly   glucose blood test strip Commonly known as:  ONE TOUCH ULTRA TEST TEST THREE TIMES DAILY   HUMALOG KWIKPEN 100 UNIT/ML KiwkPen Generic drug:  insulin lispro ADMINISTER 35 UNITS UNDER THE SKIN THREE TIMES DAILY BEFORE MEALS   HUMALOG KWIKPEN 100 UNIT/ML KiwkPen Generic drug:  insulin lispro ADMINISTER 35 UNITS UNDER THE SKIN THREE TIMES DAILY BEFORE MEALS   Insulin Pen Needle 32G X 4 MM Misc Use 5 pens per day to inject insulin   B-D UF III MINI PEN NEEDLES 31G X 5 MM Misc Generic drug:  Insulin Pen Needle USE FIVE PER DAY AS DIRECTED   INSULIN SYRINGE .5CC/30GX5/16" 30G X 5/16" 0.5 ML Misc Use 3 times a day for insulin   levocetirizine 5 MG tablet Commonly known as:  XYZAL TAKE 1 TABLET BY MOUTH EVERY EVENING   losartan 50 MG tablet Commonly known as:  COZAAR TAKE 1 TABLET(50 MG) BY MOUTH DAILY   losartan 50 MG tablet Commonly known as:  COZAAR TAKE 1 TABLET(50 MG) BY MOUTH DAILY   meloxicam 7.5 MG tablet Commonly known as:  MOBIC TAKE 1 TO 2 TABLETS BY MOUTH EVERY DAY   metFORMIN 1000 MG tablet Commonly known as:  GLUCOPHAGE TAKE 1 TABLET BY MOUTH TWICE DAILY WITH MEALS   tamsulosin 0.4 MG Caps capsule Commonly known as:  FLOMAX Take 1 capsule (0.4 mg total) by mouth daily.   tobramycin 0.3 % ophthalmic solution Commonly known as:  TOBREX Place 2 drops into the left eye every 6 (six) hours.   TOUJEO SOLOSTAR 300 UNIT/ML Sopn Generic drug:  Insulin Glargine INJECT 44 UNITS UNDER THE SKIN DAILY       Allergies: No  Known Allergies  Past Medical History:  Diagnosis Date  . Blood transfusion without reported diagnosis   . Diabetes mellitus without complication ([facility])   . History of ETOH abuse   . Hypertension     Past Surgical History:  Procedure Laterality Date  .  BACK SURGERY N/A 2010    Family History  Problem Relation Age of Onset  . Diabetes Unknown   . Hypertension Unknown   . Arthritis Father   . Diabetes Father     Social History:  reports that he has quit smoking. He quit smokeless tobacco use about 23 years ago. He reports that he does not drink alcohol or use drugs.    Review of Systems         Lipids:  He has been on Lipitor 40 mg for hypercholesterolemia since about 2011, lipid levels shows good control Also on low-dose fenofibrate Lipids followed by PCP        Lab Results  Component Value Date   CHOL 93 08/17/2017   HDL 41.40 08/17/2017   LDLCALC 29 08/17/2017   LDLDIRECT 40.0 02/13/2016   TRIG 113.0 08/17/2017   CHOLHDL 2 08/17/2017       The blood pressure has been treated with losartan 50 mg, prescribed by PCP  Last foot exam was in 05/2017 done by PCP       Physical Examination:  BP 122/80   Pulse 74   Ht 5' 3.5" (1.613 m)   Wt 205 lb (93 kg)   SpO2 96%   BMI 35.74 kg/m      ASSESSMENT:  Diabetes type 2, uncontrolled with obesity and BMI of 36  See history of present illness for detailed discussion of current diabetes management, blood sugar patterns and problems identified..  Although his A1c is relatively better at 7.6 his blood sugars are not well controlled and he has frequently high postprandial readings which he is also not monitoring enough; postprandial reading mostly over 200 He has had consistent difficulty with taking mealtime insulin Also still not understanding the differences between basal and bolus insulin and does not take them both together at mealtimes when he is supposed to. Fasting blood sugars are also inconsistent and  recently mostly high Currently also not doing much walking for exercise because of knee pain which has not been resolved   HYPERTENSION: controlled with  Invokana and recent renal function and potassium normal  PLAN:   He will look at the Omnipod pump as a possibility instead of multiple injections  Discussed in detail how this works and how it would deliver insulin and day-to-day management  He was given a sample pump to apply to see if he has any difficulty with the skin reactions  If his insurance is covering this he will retrain on doing this  May consider consultation with dietitian also again  Meanwhile he will start taking his mealtime insulin at every meal and Toujeo once a day regardless of blood sugar level  Doses discussed again  For now he can take 35 units of Toujeo daily once without adjustment since his postprandial readings will be better after lunch and dinner  He can take 40 units at lunch and dinner but only 30 in the morning  He will talk to his PCP regarding sports medicine or orthopedic consultation for his knee pain so he can start walking again   Patient education and instructions relayed through the interpreter    Patient Instructions  Take Toujeo 35 once daily every day  Humalog at ALL meals  Only 30 in am, 40 at lunch/dinner  Counseling time on subjects discussed in assessment and plan sections is over 50% of today's 25 minute visit   Mahasin Riviere 09/19/2017, 8:47 AM   Note: This office note was prepared with  Dragon [employer] therapist. Any transcriptional errors that result from this process are unintentional.

## 2017-09-19 ENCOUNTER — Ambulatory Visit: Payer: Medicare Other | Admitting: Endocrinology

## 2017-09-19 ENCOUNTER — Encounter: Payer: Self-pay | Admitting: Endocrinology

## 2017-09-19 VITALS — BP 122/80 | HR 74 | Ht 63.5 in | Wt 205.0 lb

## 2017-09-19 DIAGNOSIS — E1165 Type 2 diabetes mellitus with hyperglycemia: Secondary | ICD-10-CM | POA: Diagnosis not present

## 2017-09-19 DIAGNOSIS — Z794 Long term (current) use of insulin: Secondary | ICD-10-CM | POA: Diagnosis not present

## 2017-09-19 LAB — POCT GLYCOSYLATED HEMOGLOBIN (HGB A1C): Hemoglobin A1C: 7.6

## 2017-09-19 NOTE — Patient Instructions (Signed)
Take Toujeo 35 once daily every day  Humalog at ALL meals  Only 30 in am, 40 at lunch/dinner

## 2017-09-22 ENCOUNTER — Ambulatory Visit: Payer: Medicare Other | Admitting: Endocrinology

## 2017-10-13 ENCOUNTER — Other Ambulatory Visit: Payer: Self-pay | Admitting: Endocrinology

## 2017-10-17 ENCOUNTER — Other Ambulatory Visit: Payer: Self-pay | Admitting: Medical

## 2017-11-05 ENCOUNTER — Other Ambulatory Visit: Payer: Self-pay | Admitting: Endocrinology

## 2017-11-14 ENCOUNTER — Telehealth: Payer: Self-pay | Admitting: Medical

## 2017-11-14 ENCOUNTER — Encounter: Payer: Medicare Other | Attending: Endocrinology | Admitting: Nutrition

## 2017-11-14 DIAGNOSIS — Z794 Long term (current) use of insulin: Principal | ICD-10-CM

## 2017-11-14 DIAGNOSIS — IMO0001 Reserved for inherently not codable concepts without codable children: Secondary | ICD-10-CM

## 2017-11-14 DIAGNOSIS — E119 Type 2 diabetes mellitus without complications: Principal | ICD-10-CM

## 2017-11-14 NOTE — Telephone Encounter (Signed)
Copied from CRM [phone]. Topic: Quick Communication - See Telephone Encounter >> Nov 14, 2017 12:49 PM Valentina LucksMatos, Jackelin wrote: CRM for notification. See Telephone encounter for:  11/14/17.   Pt came in office stating was seen in our office for back issues and pt was given a medication for this, provider indicated if pt does not feel well with the meds to let him know so that a referral can be put in for pt to have a MRI done. Pt would like to have it done ASAP. Please advise.

## 2017-11-15 ENCOUNTER — Ambulatory Visit: Payer: Medicare Other | Admitting: Endocrinology

## 2017-11-15 ENCOUNTER — Encounter: Payer: Medicare Other | Admitting: Nutrition

## 2017-11-15 DIAGNOSIS — Z0289 Encounter for other administrative examinations: Secondary | ICD-10-CM

## 2017-11-15 NOTE — Telephone Encounter (Signed)
Last visit we discussed this was in October. I could try to place mri. Would you call patient and clarify if he has been having any pain shooting down either leg or any leg weakness(any numbness or tingling to legs). This may be helpful getting mri approved. Also I was wondering has he ever seen neurosurgeon Dr. Danielle DessElsner? I might refer to him.  I can try to write mri and referral. Since last seen in October might need to see him again. Sometimes insurance will want recent note in deciding on approval for mri.  Let me know what he says then I can try mri or refer to neurosurgeon.

## 2017-11-15 NOTE — Telephone Encounter (Signed)
Called pt and asked him about the pain on both legs and about the numbness- pt states is having a lot of numbness on both legs and really having bad shooting pains on the knee area on both legs, specially during the night and when he is sitting down and tries to get up it cause a lot of trouble and numbness that he feels afraid that he can fall down. Pt informed does not remember if he saw Dr Danielle DessElsner since he states did see a doctor last year but that the doctor that saw him did not have a xray done nor an MRI and he really wanted them to do so but did not happen, that is why pt is asking for the MRI before going to any specialist is he is still having issues.

## 2017-11-16 ENCOUNTER — Other Ambulatory Visit: Payer: Self-pay

## 2017-11-16 ENCOUNTER — Ambulatory Visit: Payer: Medicare Other | Admitting: Endocrinology

## 2017-11-16 ENCOUNTER — Encounter: Payer: Self-pay | Admitting: Endocrinology

## 2017-11-16 VITALS — BP 148/70 | HR 73 | Ht 64.0 in | Wt 211.0 lb

## 2017-11-16 DIAGNOSIS — Z794 Long term (current) use of insulin: Secondary | ICD-10-CM

## 2017-11-16 DIAGNOSIS — E1165 Type 2 diabetes mellitus with hyperglycemia: Secondary | ICD-10-CM

## 2017-11-16 MED ORDER — DULAGLUTIDE 0.75 MG/0.5ML ~~LOC~~ SOAJ: SUBCUTANEOUS | 3 refills | Status: DC

## 2017-11-16 MED ORDER — INSULIN LISPRO 100 UNIT/ML ~~LOC~~ SOLN: SUBCUTANEOUS | 3 refills | Status: DC

## 2017-11-16 MED ORDER — GLUCOSE BLOOD VI STRP: 1.0000 | ORAL_STRIP | 3 refills | Status: DC | PRN

## 2017-11-16 NOTE — Patient Instructions (Signed)
14 units at Bfat and dinner and 12 at lunch

## 2017-11-16 NOTE — Progress Notes (Signed)
Patient ID: Samuel [name], male   DOB: 07/24/1951, 67 y.o.   MRN: [id number]            Reason for Appointment:  Followup for Type 2 Diabetes  Referring physician: Saguier  History of Present Illness:          Diagnosis: Type 2 diabetes mellitus, date of diagnosis:  1990      Past history: He thinks he has been on insulin for the last 10-12 years. Previously had been on metformin which has been continued. He was probably tried on different insulin regimens initially but has been taking 70/30 mostly because of cost Previous records are not available and appears that his sugars have been poorly controlled for several years He was  referred here because of an A1c in 8/15 of 9.7%. He was on a regimen of Novolin 70/30 twice a day but because of inadequate control and higher readings later in the day he was switched to basal bolus insulin regimen in 12/15.  Recent history:   INSULIN regimen is: OMNIPOD INSULIN PUMP  Basal rate settings: Midnight = 1.0, 7 am: 1.5.  Boluses 12-14 at meals: Carbohydrate coverage 1:10 and correction 1:50  Previous insulin regimen: Humalog  ac meals 35-40-30 Toujeo 35-40 units daily   Non-insulin hypoglycemic drugs: metformin 1 g twice a day, Trulicity 1.5 mg weekly Invokana 100 mg daily     A1c has been generally over 8-9% although 7.6 in 11/18  Current management, blood sugar patterns and problems identified:  He has been started on the Omnipod insulin pump on 11/14/2017  He did not follow-up yesterday as directed but his blood sugars are relatively better compared to multiple insulin injections  Previously his fasting readings were averaging about 160 but variable and as high as 232  Fasting readings on the pump read 154, 157  Lunchtime glucose: 144, 186  Suppertime 147, 99  Bedtime 185 with only one reading  Although he was told to take 14 units boluses he is mostly taking 12 units for fear of hypoglycemia  Also yesterday at lunchtime he had  suspended his bolus because this pump was beeping for up to 15 minutes  Currently he does not know how to change his basal rate but is able to do his boluses and enter his blood sugars from his One Touch meter  Unable to download his pump today and boluses and readings were evaluated manually   He does not take his Invokana daily.  He thinks he makes and feeling sleepy        Side effects from medications have been: ?  Nausea/dizziness on Victoza  Compliance with the medical regimen: Fair  Hypoglycemia:   rarely, has only one low sugar at 1 AM  Glucose monitoring:  done 2-3 times a day         Glucometer:  One Touch ultra 2      Blood Glucose readings by download for the last 30 days show median 167 with highest blood sugars after lunch and late evening with overall range 71-322  Self-care:  Variable portions, may have unbalanced meals  Meals: 3 meals per day. Breakfast at 8-9 am is various kinds of bread, milk and sometimes eggs. Lunch generally larger meal  Dinner at 6 pm     Dietician visit, most recent: 5/16 Last CDE visit: 12/2016                 Exercise: Walking off and on  Weight history: Previous  range 200-220  Wt Readings from Last 3 Encounters:  11/16/17 211 lb (95.7 kg)  09/19/17 205 lb (93 kg)  08/17/17 203 lb (92.1 kg)    Glycemic control:   Lab Results  Component Value Date   HGBA1C 7.6 09/19/2017   HGBA1C 8.9 (H) 05/17/2017   HGBA1C 7.1 02/15/2017   Lab Results  Component Value Date   MICROALBUR 0.8 07/20/2017   LDLCALC 29 08/17/2017   CREATININE 0.81 08/17/2017    Lab Results  Component Value Date   FRUCTOSAMINE 302 (H) 07/20/2017       Allergies as of 11/16/2017   No Known Allergies     Medication List        Accurate as of 11/16/17  8:54 PM. Always use your most recent med list.          aspirin 325 MG tablet Take 81 mg by mouth daily.   atorvastatin 40 MG tablet Commonly known as:  LIPITOR TAKE 1 TABLET BY MOUTH DAILY AND 1/2  TABLET BY MOUTH EVERY NIGHT AT BEDTIME   canagliflozin 100 MG Tabs tablet Commonly known as:  INVOKANA 1 tablet before breakfast   cholecalciferol 1000 units tablet Commonly known as:  VITAMIN D Take 1,000 Units by mouth daily. Reported on 11/05/2015   Dulaglutide 1.5 MG/0.5ML Sopn Commonly known as:  TRULICITY Inject weekly   TRULICITY 0.75 MG/0.5ML Sopn Generic drug:  Dulaglutide INJECT 0.75 MG INTO THE SKIN ONCE A WEEK   glucose blood test strip Commonly known as:  ONE TOUCH ULTRA TEST USE AS DIRECTED TO TEST BLOOD SUGAR THREE TIMES DAILY   glucose blood test strip Commonly known as:  FREESTYLE LITE 1 each by Other route as needed for other. Use as instructed   HUMALOG KWIKPEN 100 UNIT/ML KiwkPen Generic drug:  insulin lispro ADMINISTER 35 UNITS UNDER THE SKIN THREE TIMES DAILY BEFORE MEALS   insulin lispro 100 UNIT/ML injection Commonly known as:  HUMALOG USE 50 UNITS IN PUMP DAILY   Insulin Pen Needle 32G X 4 MM Misc Use 5 pens per day to inject insulin   B-D UF III MINI PEN NEEDLES 31G X 5 MM Misc Generic drug:  Insulin Pen Needle USE FIVE PER DAY AS DIRECTED   INSULIN SYRINGE .5CC/30GX5/16" 30G X 5/16" 0.5 ML Misc Use 3 times a day for insulin   levocetirizine 5 MG tablet Commonly known as:  XYZAL TAKE 1 TABLET BY MOUTH EVERY EVENING   losartan 50 MG tablet Commonly known as:  COZAAR TAKE 1 TABLET(50 MG) BY MOUTH DAILY   losartan 50 MG tablet Commonly known as:  COZAAR TAKE 1 TABLET(50 MG) BY MOUTH DAILY   meloxicam 7.5 MG tablet Commonly known as:  MOBIC TAKE 1 TO 2 TABLETS BY MOUTH EVERY DAY   metFORMIN 1000 MG tablet Commonly known as:  GLUCOPHAGE TAKE 1 TABLET BY MOUTH TWICE DAILY WITH MEALS   tamsulosin 0.4 MG Caps capsule Commonly known as:  FLOMAX Take 1 capsule (0.4 mg total) by mouth daily.   tobramycin 0.3 % ophthalmic solution Commonly known as:  TOBREX Place 2 drops into the left eye every 6 (six) hours.   TOUJEO SOLOSTAR 300  UNIT/ML Sopn Generic drug:  Insulin Glargine INJECT 44 UNITS UNDER THE SKIN DAILY       Allergies: No Known Allergies  Past Medical History:  Diagnosis Date  . Blood transfusion without reported diagnosis   . Diabetes mellitus without complication (HCC)   . History of ETOH abuse   .  Hypertension     Past Surgical History:  Procedure Laterality Date  . BACK SURGERY N/A 2010    Family History  Problem Relation Age of Onset  . Diabetes Unknown   . Hypertension Unknown   . Arthritis Father   . Diabetes Father     Social History:  reports that he has quit smoking. He quit smokeless tobacco use about 24 years ago. He reports that he does not drink alcohol or use drugs.    Review of Systems   The following a copy of the previous note        Lipids:  He has been on Lipitor 40 mg for hypercholesterolemia since about 2011, lipid levels shows good control Also on low-dose fenofibrate Lipids followed by PCP        Lab Results  Component Value Date   CHOL 93 08/17/2017   HDL 41.40 08/17/2017   LDLCALC 29 08/17/2017   LDLDIRECT 40.0 02/13/2016   TRIG 113.0 08/17/2017   CHOLHDL 2 08/17/2017       The blood pressure has been treated with losartan 50 mg, prescribed by PCP  Last foot exam was in 05/2017 done by PCP       Physical Examination:  BP (!) 148/70   Pulse 73   Ht 5\' 4"  (1.626 m)   Wt 211 lb (95.7 kg)   SpO2 96%   BMI 36.22 kg/m      ASSESSMENT:  Diabetes type 2, uncontrolled with obesity and BMI of 36  See history of present illness for  description of current diabetes management, blood sugar patterns and problems identified.[name]  He is just starting the Omnipod insulin pump instead of multiple injections to improve his level of control and compliance So far he has been able to use the pump and delayed boluses and personally likes to use it Recent blood sugar range is 99-186 Blood sugars appear to be relatively higher fasting, lunchtime and  bedtime   PLAN:      Discussed that we should not reduce his bolus down to 12 units which he has done for some of his meals  He can at least take 14 units at breakfast and dinner  Also he can take his Invokana daily instead of irregularly and if he feels it causes somnolence he can take it at bedtime  Since fasting readings are modestly increased and increase the basal rate at midnight To 1.1 which was done in the office  Will have him review his pump settings, programming and blood sugars again in the morning with the CDE from [employer], he will discuss his issues with doing the boluses  Emphasized the need to place his pod every 3 days  He will call back later this week to review his blood sugar readings and follow-up in 3 weeks in the office  Emphasized the need to check blood sugars after supper to help adjust his boluses  Continue Trulicity weekly  New prescription given for Freestyle test strips so he can use the meter associated with the pump Also Humalog prescription sent  Patient education and instructions relayed through the interpreter    Counseling time on subjects discussed in assessment and plan sections is over 50% of today's 25 minute visit   Patient Instructions  14 units at Bfat and dinner and 12 at lunch    Reather Littler 11/16/2017, 8:54 PM   Note: This office note was prepared with Dragon voice recognition system technology. Any transcriptional errors that result from  this process are unintentional.

## 2017-11-17 ENCOUNTER — Ambulatory Visit: Payer: Medicare Other | Admitting: Medical

## 2017-11-17 ENCOUNTER — Ambulatory Visit: Payer: Medicare Other | Admitting: Endocrinology

## 2017-11-17 ENCOUNTER — Telehealth: Payer: Self-pay | Admitting: Endocrinology

## 2017-11-17 ENCOUNTER — Encounter: Payer: Self-pay | Admitting: Medical

## 2017-11-17 VITALS — BP 120/68 | HR 64 | Temp 98.2°F | Resp 16 | Ht 64.0 in | Wt 208.0 lb

## 2017-11-17 DIAGNOSIS — E785 Hyperlipidemia, unspecified: Secondary | ICD-10-CM | POA: Diagnosis not present

## 2017-11-17 DIAGNOSIS — M5126 Other intervertebral disc displacement, lumbar region: Secondary | ICD-10-CM | POA: Diagnosis not present

## 2017-11-17 DIAGNOSIS — E119 Type 2 diabetes mellitus without complications: Secondary | ICD-10-CM | POA: Diagnosis not present

## 2017-11-17 DIAGNOSIS — I10 Essential (primary) hypertension: Secondary | ICD-10-CM | POA: Diagnosis not present

## 2017-11-17 DIAGNOSIS — G8929 Other chronic pain: Secondary | ICD-10-CM

## 2017-11-17 DIAGNOSIS — M25561 Pain in right knee: Secondary | ICD-10-CM | POA: Diagnosis not present

## 2017-11-17 DIAGNOSIS — M25562 Pain in left knee: Secondary | ICD-10-CM

## 2017-11-17 DIAGNOSIS — M5442 Lumbago with sciatica, left side: Secondary | ICD-10-CM | POA: Diagnosis not present

## 2017-11-17 DIAGNOSIS — Z794 Long term (current) use of insulin: Secondary | ICD-10-CM | POA: Diagnosis not present

## 2017-11-17 DIAGNOSIS — M25552 Pain in left hip: Secondary | ICD-10-CM

## 2017-11-17 DIAGNOSIS — M25551 Pain in right hip: Secondary | ICD-10-CM | POA: Diagnosis not present

## 2017-11-17 LAB — LIPID PANEL
CHOLESTEROL: 94 mg/dL (ref 0–200)
HDL: 43.2 mg/dL (ref >39.00)
LDL Cholesterol: 33 mg/dL (ref 0–99)
NONHDL: 50.96
Total CHOL/HDL Ratio: 2
Triglycerides: 89 mg/dL (ref 0.0–149.0)
VLDL: 17.8 mg/dL (ref 0.0–40.0)

## 2017-11-17 LAB — COMPREHENSIVE METABOLIC PANEL
ALBUMIN: 4.4 g/dL (ref 3.5–5.2)
ALK PHOS: 47 U/L (ref 39–117)
ALT: 27 U/L (ref 0–53)
AST: 22 U/L (ref 0–37)
BILIRUBIN TOTAL: 1.1 mg/dL (ref 0.2–1.2)
BUN: 19 mg/dL (ref 6–23)
CO2: 28 mEq/L (ref 19–32)
CREATININE: 0.74 mg/dL (ref 0.40–1.50)
Calcium: 9.3 mg/dL (ref 8.4–10.5)
Chloride: 102 mEq/L (ref 96–112)
GFR: 112.34 mL/min (ref >60.00)
GLUCOSE: 153 mg/dL — AB (ref 70–99)
Potassium: 4.1 mEq/L (ref 3.5–5.1)
SODIUM: 139 meq/L (ref 135–145)
TOTAL PROTEIN: 7.2 g/dL (ref 6.0–8.3)

## 2017-11-17 MED ORDER — TRAMADOL HCL 50 MG PO TABS: 50.0000 mg | ORAL_TABLET | Freq: Three times a day (TID) | ORAL | 0 refills | Status: DC | PRN

## 2017-11-17 NOTE — Telephone Encounter (Signed)
H[facility] from [facility] called and stated that they needed more information on Patient pump.  Last office visit Height & weight Pump serial # Name of pump What insurance that paid for pump Purchase date for pump Method of administration Route, Sub Q or IV  Phone # [phone] Fax # [phone]

## 2017-11-17 NOTE — Progress Notes (Addendum)
Subjective:    Patient ID: Samuel [name], male    DOB: 1951-09-19, 67 y.o.   MRN: [id number]  HPI  Pt today is really explaining more just pain in both hips. He is not complaining of pain mid spinal but more in left si area.. He also reports knee pain. Occaionally legs feel weak. No radicular pain.   Pt has some mild degenerative hip finding on xray in 2017.   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  Pain above for 2-3 years. Pain is increasing.  His pain is progressively getting worse. More just recent past 2 months.     Review of Systems  Constitutional: Negative for chills, fatigue and fever.  Respiratory: Negative for cough, choking, shortness of breath and wheezing.   Cardiovascular: Negative for chest pain and palpitations.  Gastrointestinal: Negative for abdominal pain, anal bleeding and blood in stool.  Musculoskeletal: Positive for back pain.       Hip and knee pain  Skin: Negative for rash.  Neurological: Negative for dizziness, speech difficulty, weakness, light-headedness and headaches.  Hematological: Negative for adenopathy. Does not bruise/bleed easily.  Psychiatric/Behavioral: Negative for behavioral problems and confusion.   Past Medical History:  Diagnosis Date  . Blood transfusion without reported diagnosis   . Diabetes mellitus without complication (HCC)   . History of ETOH abuse   . Hypertension      Social History   Socioeconomic History  . Marital status: Married    Spouse name: Not on file  . Number of children: Not on file  . Years of education: Not on file  . Highest education level: Not on file  Social Needs  . Financial resource strain: Not on file  . Food insecurity - worry: Not on file  . Food insecurity - inability: Not on file  . Transportation needs - medical: Not on file  . Transportation needs - non-medical: Not on file  Occupational History  . Not on file  Tobacco Use  . Smoking status: Former Games developer  . Smokeless  tobacco: Former Neurosurgeon    Quit date: 11/08/1993  Substance and Sexual Activity  . Alcohol use: No    Alcohol/week: 0.0 oz  . Drug use: No  . Sexual activity: Not on file  Other Topics Concern  . Not on file  Social History Narrative  . Not on file    Past Surgical History:  Procedure Laterality Date  . BACK SURGERY N/A 2010    Family History  Problem Relation Age of Onset  . Diabetes Unknown   . Hypertension Unknown   . Arthritis Father   . Diabetes Father     No Known Allergies  Current Outpatient Medications on File Prior to Visit  Medication Sig Dispense Refill  . aspirin 325 MG tablet Take 81 mg by mouth daily.     [name] atorvastatin (LIPITOR) 40 MG tablet TAKE 1 TABLET BY MOUTH DAILY AND 1/2 TABLET BY MOUTH EVERY NIGHT AT BEDTIME 45 tablet 0  . B-D UF III MINI PEN NEEDLES 31G X 5 MM MISC USE FIVE PER DAY AS DIRECTED 200 each 0  . canagliflozin (INVOKANA) 100 MG TABS tablet 1 tablet before breakfast 30 tablet 3  . cholecalciferol (VITAMIN D) 1000 UNITS tablet Take 1,000 Units by mouth daily. Reported on 11/05/2015    . Dulaglutide (TRULICITY) 0.75 MG/0.5ML SOPN INJECT 0.75 MG INTO THE SKIN ONCE A WEEK 4 pen 3  . glucose blood (FREESTYLE LITE) test strip 1 each by  Other route as needed for other. Use as instructed 360 each 3  . glucose blood (ONE TOUCH ULTRA TEST) test strip USE AS DIRECTED TO TEST BLOOD SUGAR THREE TIMES DAILY 300 each 0  . HUMALOG KWIKPEN 100 UNIT/ML KiwkPen ADMINISTER 35 UNITS UNDER THE SKIN THREE TIMES DAILY BEFORE MEALS 30 mL 0  . insulin lispro (HUMALOG) 100 UNIT/ML injection USE 50 UNITS IN PUMP DAILY 60 mL 3  . Insulin Pen Needle 32G X 4 MM MISC Use 5 pens per day to inject insulin 200 each 3  . Insulin Syringe-Needle U-100 (INSULIN SYRINGE .5CC/30GX5/16") 30G X 5/16" 0.5 ML MISC Use 3 times a day for insulin 100 each 1  . meloxicam (MOBIC) 7.5 MG tablet TAKE 1 TO 2 TABLETS BY MOUTH EVERY DAY 90 tablet 0  . metFORMIN (GLUCOPHAGE) 1000 MG tablet TAKE 1  TABLET BY MOUTH TWICE DAILY WITH MEALS 180 tablet 0   No current facility-administered medications on file prior to visit.     BP 120/68 (BP Location: Right Arm, Patient Position: Sitting, Cuff Size: Large)   Pulse 64   Temp 98.2 F (36.8 C) (Oral)   Resp 16   Ht 5\' 4"  (1.626 m)   Wt 208 lb (94.3 kg)   SpO2 98%   BMI 35.70 kg/m       Objective:   Physical Exam  General Appearance- Not in acute distress.    Chest and Lung Exam Auscultation: Breath sounds:-Normal. Clear even and unlabored. Adventitious sounds:- No Adventitious sounds.  Cardiovascular Auscultation:Rythm - Regular, rate and rythm. Heart Sounds -Normal heart sounds.  Abdomen Inspection:-Inspection Normal.  Palpation/Perucssion: Palpation and Percussion of the abdomen reveal- Non Tender, No Rebound tenderness, No rigidity(Guarding) and No Palpable abdominal masses.  Liver:-Normal.  Spleen:- Normal.   Back No mid lumbar spine tenderness to palpation. Pain in left si area. Pain on straight leg lift. Pain on lateral movements and flexion/extension of the spine.  Lower ext neurologic  L5-S1 sensation intact bilaterally. Normal patellar reflexes bilaterally. No foot drop bilaterally.  Hips- mild pain on range of motion. No crepitus.   Knees- pain on range of motion both sides. Left more than rt. No crepitus.      Assessment & Plan:  For your chronic hip region pain,  knee pains, and some sciatica type features with prior positive findings on MRI, I want you to continue meloxicam for pain but I am also making tramadol available for you.  I am trying to reorder your MRI and will contact you if your insurance authorizes that.  I am also going to refer you to sports medicine since your x-ray findings(hips and knees) are not that severe and your source of pain may be a soft tissue in origin.  Your blood pressure is good today.  I have reviewed recent endocrinologist no and recommend you continue his  advised treatment plan.  Follow-up in 3 months with me or as needed if your pains flare despite treatment from sports medicine.  Trying to get my note visible in epic.Only visible presently in snap shot. Epic error?

## 2017-11-17 NOTE — Patient Instructions (Addendum)
For your chronic hip region pain,  knee pains, and some sciatica type features with prior positive findings on MRI, I want you to continue meloxicam for pain but I am also making tramadol available for you.  I am trying to reorder your MRI and will contact you if your insurance authorizes that.  I am also going to refer you to sports medicine since your x-ray findings(hips and knees) are not that severe and your source of pain may be a soft tissue in origin.  Your blood pressure is good today.  I have reviewed recent endocrinologist no and recommend you continue his advised treatment plan.  Follow-up in 3 months with me or as needed if your pains flare despite treatment from sports medicine.

## 2017-11-18 ENCOUNTER — Ambulatory Visit: Payer: Medicare Other | Admitting: Endocrinology

## 2017-11-18 ENCOUNTER — Telehealth: Payer: Self-pay | Admitting: Medical

## 2017-11-18 NOTE — Telephone Encounter (Signed)
Spoke with pt and pt understood his lab results.

## 2017-11-18 NOTE — Telephone Encounter (Signed)
-----   Message from Esperanza RichtersEdward Saguier, PA-C sent at 11/17/2017  7:34 PM EST ----- metabolic panel shows good kidney function and normal liver enzymes.  Sugar was only mildly elevated at 153 at the time of the exam.  His lipid panel looks normal/good.  Continue current medications

## 2017-11-19 ENCOUNTER — Ambulatory Visit (HOSPITAL_BASED_OUTPATIENT_CLINIC_OR_DEPARTMENT_OTHER)
Admission: RE | Admit: 2017-11-19 | Discharge: 2017-11-19 | Disposition: A | Discharge status: Home / Self Care | Payer: Medicare Other | Source: Ambulatory Visit | Attending: Medical | Admitting: Medical

## 2017-11-19 DIAGNOSIS — M5126 Other intervertebral disc displacement, lumbar region: Secondary | ICD-10-CM | POA: Insufficient documentation

## 2017-11-19 DIAGNOSIS — M48061 Spinal stenosis, lumbar region without neurogenic claudication: Secondary | ICD-10-CM | POA: Insufficient documentation

## 2017-11-21 ENCOUNTER — Encounter: Payer: Medicare Other | Admitting: Nutrition

## 2017-11-22 ENCOUNTER — Other Ambulatory Visit: Payer: Self-pay

## 2017-11-22 ENCOUNTER — Other Ambulatory Visit: Payer: Self-pay | Admitting: Endocrinology

## 2017-11-22 ENCOUNTER — Telehealth: Payer: Self-pay | Admitting: Medical

## 2017-11-22 DIAGNOSIS — G8929 Other chronic pain: Secondary | ICD-10-CM

## 2017-11-22 DIAGNOSIS — M5442 Lumbago with sciatica, left side: Principal | ICD-10-CM

## 2017-11-22 NOTE — Telephone Encounter (Signed)
[facility] says they ship the pump supplies to the patient q 3 months and will call the patient 1 week before to confirm the order.  They were told to have a spanish speaking person call.  The paitent was then called and told vial voice mail this.

## 2017-11-23 ENCOUNTER — Telehealth: Payer: Self-pay | Admitting: Medical

## 2017-11-23 NOTE — Telephone Encounter (Signed)
Referral to neurosurgeon placed. 

## 2017-11-23 NOTE — Telephone Encounter (Signed)
Spoke with pt and informed him the below, pt was a bit busy this morning and stated probably will call to verify the info again.

## 2017-11-23 NOTE — Telephone Encounter (Signed)
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  Also some narrowing of the region and lower back where the nerves exit.  I did go ahead and make a referral to neurosurgeon and provided him the summary of the MRI report. Neurourgeon referred to is Dr. Danielle DessElsner.

## 2017-11-24 ENCOUNTER — Ambulatory Visit: Payer: Medicare Other | Admitting: Family Medicine

## 2017-12-02 ENCOUNTER — Telehealth: Payer: Self-pay | Admitting: Endocrinology

## 2017-12-02 ENCOUNTER — Other Ambulatory Visit: Payer: Self-pay

## 2017-12-02 MED ORDER — INSULIN LISPRO 100 UNIT/ML (KWIKPEN): PEN_INJECTOR | SUBCUTANEOUS | 3 refills | Status: DC

## 2017-12-02 NOTE — Telephone Encounter (Signed)
This has been done.

## 2017-12-02 NOTE — Telephone Encounter (Signed)
Patient need a refill of his insulin, insurance will not cover his insulin, they need a PA for  HUMALOG KWIKPEN 100 UNIT/ML [employer] [[id number]]   [employer] Fax # [phone]

## 2017-12-05 ENCOUNTER — Other Ambulatory Visit: Payer: Self-pay

## 2017-12-05 MED ORDER — ACCU-CHEK AVIVA PLUS W/DEVICE KIT: PACK | 1 refills | Status: DC

## 2017-12-05 MED ORDER — GLUCOSE BLOOD VI STRP: ORAL_STRIP | 12 refills | Status: DC

## 2017-12-05 MED ORDER — ACCU-CHEK SOFTCLIX LANCETS MISC: 12 refills | Status: DC

## 2017-12-05 NOTE — Progress Notes (Signed)
Patient was trained on hisOmniPOd pump by [employer] CorporationLaura Hite.  See scanned notes.

## 2017-12-06 ENCOUNTER — Other Ambulatory Visit: Payer: Self-pay

## 2017-12-06 MED ORDER — INSULIN LISPRO 100 UNIT/ML ~~LOC~~ SOLN: SUBCUTANEOUS | 3 refills | Status: DC

## 2017-12-07 ENCOUNTER — Other Ambulatory Visit: Payer: Self-pay

## 2017-12-07 MED ORDER — ATORVASTATIN CALCIUM 40 MG PO TABS: ORAL_TABLET | ORAL | 0 refills | Status: DC

## 2017-12-09 ENCOUNTER — Other Ambulatory Visit: Payer: Self-pay

## 2017-12-09 MED ORDER — INSULIN ASPART 100 UNIT/ML ~~LOC~~ SOLN: SUBCUTANEOUS | 2 refills | Status: DC

## 2017-12-14 NOTE — Telephone Encounter (Signed)
Samuel [name] from [facility]  is needing to know the frequency for the Soft click lancets.   please advise Phone # [phone]  Ref # [id number]

## 2017-12-16 ENCOUNTER — Other Ambulatory Visit: Payer: Self-pay | Admitting: Endocrinology

## 2017-12-16 MED ORDER — ACCU-CHEK SOFTCLIX LANCETS MISC: 2 refills | Status: DC

## 2017-12-16 NOTE — Telephone Encounter (Signed)
Refill submitted for 4 times per day.

## 2017-12-16 NOTE — Telephone Encounter (Signed)
Needs to be checking 4 times a day

## 2017-12-16 NOTE — Telephone Encounter (Signed)
Please clarify how may times a day the patient should be testing. Could not locate documentation that stated the frequency.

## 2017-12-20 ENCOUNTER — Encounter: Payer: Self-pay | Admitting: Endocrinology

## 2017-12-20 ENCOUNTER — Ambulatory Visit: Payer: Medicare Other | Admitting: Endocrinology

## 2017-12-20 VITALS — BP 128/76 | HR 67 | Ht 63.5 in | Wt 210.0 lb

## 2017-12-20 DIAGNOSIS — I10 Essential (primary) hypertension: Secondary | ICD-10-CM

## 2017-12-20 DIAGNOSIS — E1165 Type 2 diabetes mellitus with hyperglycemia: Secondary | ICD-10-CM

## 2017-12-20 DIAGNOSIS — Z794 Long term (current) use of insulin: Secondary | ICD-10-CM | POA: Diagnosis not present

## 2017-12-20 NOTE — Progress Notes (Addendum)
Patient ID: Samuel [name], male   DOB: 1951-05-07, 67 y.o.   MRN: [id number]            Reason for Appointment:  Followup for Type 2 Diabetes  Referring physician: Saguier  History of Present Illness:          Diagnosis: Type 2 diabetes mellitus, date of diagnosis:  1990      Past history: He thinks he has been on insulin for the last 10-12 years. Previously had been on metformin which has been continued. He was probably tried on different insulin regimens initially but has been taking 70/30 mostly because of cost Previous records are not available and appears that his sugars have been poorly controlled for several years He was  referred here because of an A1c in 8/15 of 9.7%. He was on a regimen of Novolin 70/30 twice a day but because of inadequate control and higher readings later in the day he was switched to basal bolus insulin regimen in 12/15.  Recent history:   INSULIN regimen is: OMNIPOD INSULIN PUMP  Basal rate settings: Midnight = 1.1, 7 am: 1.5.  Boluses 12-14 at meals: Carbohydrate coverage 1:10 and correction 1:50  Previous insulin regimen: Humalog  ac meals 35-40-30 Toujeo 35-40 units daily   Non-insulin hypoglycemic drugs: metformin 1 g twice a day, Trulicity 1.5 mg weekly, Invokana 100 mg daily     A1c has been generally over 8-9% although 7.6 in 11/18  Current management, blood sugar patterns and problems identified:  He is coming back for follow-up up to starting the Omnipod pump in January  Although his blood sugars are relatively stable he is getting somewhat higher readings periodically at all times; however is not checking blood sugars at lunchtime as much  Despite increasing his basal rate at night more recently is getting some high readings fasting  He is doing fairly well with trying to bolus with every meal although last night forgot to bolus at dinnertime  He apparently is only filling his pump halfway and is needing to change it everyday  Also he  refuses to take Invokana even though he was told to try and take it at night as it makes him sleepy  HIGHEST blood sugars are still after evening meal although occasionally they are not as high  He is not adjusting his mealtime boluses based on what he is eating and his food composition can be quite variable in the evening  FASTING blood sugars are as low as 107 but not recently  His lowest blood sugar has been only 85        Side effects from medications have been: ?  Nausea/dizziness on Victoza  Compliance with the medical regimen: Fair   Glucose monitoring:  done 2-3 times a day         Glucometer:  One Touch ultra 2      Blood Glucose readings by download for the last 30 days show  Mean values apply above for all meters except median for One Touch  PRE-MEAL Fasting Lunch Dinner Bedtime Overall  Glucose range: 107-250    111-324  85-324  Mean/median:  159  137  155  180  160   POST-MEAL PC Breakfast PC Lunch PC Dinner  Glucose range:     Mean/median:  179  243    Self-care:  Variable portions, may have unbalanced meals  Meals: 3 meals per day. Breakfast at 8-9 am is various kinds of bread, milk and sometimes eggs.  Lunch generally larger meal  Dinner at 6 pm     Dietician visit, most recent: 5/16 Last CDE visit: 12/2016                 Exercise: Walking off and on  Weight history: Previous range 200-220  Wt Readings from Last 3 Encounters:  12/20/17 210 lb (95.3 kg)  11/17/17 208 lb (94.3 kg)  11/16/17 211 lb (95.7 kg)    Glycemic control:   Lab Results  Component Value Date   HGBA1C 7.6 09/19/2017   HGBA1C 8.9 (H) 05/17/2017   HGBA1C 7.1 02/15/2017   Lab Results  Component Value Date   MICROALBUR 0.8 07/20/2017   LDLCALC 33 11/17/2017   CREATININE 0.74 11/17/2017    Lab Results  Component Value Date   FRUCTOSAMINE 302 (H) 07/20/2017       Allergies as of 12/20/2017   No Known Allergies     Medication List        Accurate as of 12/20/17 11:59  PM. Always use your most recent med list.          ACCU-CHEK AVIVA PLUS w/Device Kit USE TO CHECK BLOOD SUGAR DAILY   ACCU-CHEK SOFTCLIX LANCETS lancets Use to check blood sugar 4 times per day.   aspirin 325 MG tablet Take 81 mg by mouth daily.   atorvastatin 40 MG tablet Commonly known as:  LIPITOR TAKE 1 TABLET BY MOUTH DAILY AND 1/2 TABLET BY MOUTH EVERY NIGHT AT BEDTIME   cholecalciferol 1000 units tablet Commonly known as:  VITAMIN D Take 1,000 Units by mouth daily. Reported on 11/05/2015   Dulaglutide 0.75 MG/0.5ML Sopn Commonly known as:  TRULICITY INJECT 1.76 MG INTO THE SKIN ONCE A WEEK   glucose blood test strip Commonly known as:  ACCU-CHEK AVIVA PLUS Use as instructed   insulin lispro 100 UNIT/ML injection Commonly known as:  HUMALOG Inject 65 Units into the skin daily.   insulin lispro 100 UNIT/ML KiwkPen Commonly known as:  HUMALOG KWIKPEN ADMINISTER 35 UNITS UNDER THE SKIN THREE TIMES DAILY BEFORE MEALS   Insulin Pen Needle 32G X 4 MM Misc Use 5 pens per day to inject insulin   B-D UF III MINI PEN NEEDLES 31G X 5 MM Misc Generic drug:  Insulin Pen Needle USE FIVE PER DAY AS DIRECTED   INSULIN SYRINGE .5CC/30GX5/16" 30G X 5/16" 0.5 ML Misc Use 3 times a day for insulin   INVOKANA 100 MG Tabs tablet Generic drug:  canagliflozin TAKE 1 TABLET BY MOUTH BEFORE BREAKFAST   meloxicam 7.5 MG tablet Commonly known as:  MOBIC TAKE 1 TO 2 TABLETS BY MOUTH EVERY DAY   metFORMIN 1000 MG tablet Commonly known as:  GLUCOPHAGE TAKE 1 TABLET BY MOUTH TWICE DAILY WITH MEALS   traMADol 50 MG tablet Commonly known as:  ULTRAM Take 1 tablet (50 mg total) by mouth every 8 (eight) hours as needed.       Allergies: No Known Allergies  Past Medical History:  Diagnosis Date  . Blood transfusion without reported diagnosis   . Diabetes mellitus without complication ([place])   . History of ETOH abuse   . Hypertension     Past Surgical History:  Procedure  Laterality Date  . BACK SURGERY N/A 2010    Family History  Problem Relation Age of Onset  . Diabetes Unknown   . Hypertension Unknown   . Arthritis Father   . Diabetes Father     Social History:  reports that  he has quit smoking. He quit smokeless tobacco use about 24 years ago. He reports that he does not drink alcohol or use drugs.    Review of Systems   The following a copy of the previous note        Lipids:  He has been on Lipitor 40 mg for hypercholesterolemia since about 2011, lipid levels shows good control Also on low-dose fenofibrate Lipids followed by PCP        Lab Results  Component Value Date   CHOL 94 11/17/2017   HDL 43.20 11/17/2017   LDLCALC 33 11/17/2017   LDLDIRECT 40.0 02/13/2016   TRIG 89.0 11/17/2017   CHOLHDL 2 11/17/2017       The blood pressure has been treated with losartan 50 mg, prescribed by PCP  Last foot exam was in 05/2017 done by PCP       Physical Examination:  BP 128/76 (BP Location: Left Arm, Patient Position: Sitting, Cuff Size: Normal)   Pulse 67   Ht 5' 3.5" (1.613 m)   Wt 210 lb (95.3 kg)   SpO2 95%   BMI 36.62 kg/m      ASSESSMENT:  Diabetes type 2, uncontrolled with obesity and BMI of 36  See history of present illness for  description of current diabetes management, blood sugar patterns and problems identified..  Although he is able to manage his pump fairly well and doing his boluses and changing his pump when needed he is still having hyperglycemia This is likely from his not taking Invokana However still he needs to get higher basal rate overnight Most likely unless he is eating a smaller meal he will need to increase his bolus at dinnertime with the range of boluses 14-18 units He can continue to use fixed boluses Reminded him that he needs to bolus with for every meal especially in the evening even when eating out   PLAN:      Increase lunchtime bolus to 14 units  Increase suppertime as well as as  above if eating larger meals  He will need to fill his insulin pump completely and not halfway so he does not have to change it everyday  More blood sugars after meals including breakfast and lunch  He will increase the basal rate at midnight up to 1.2 and continue 1.5 after 70  He will be seen by the nurse educator and he will be shown how to enter his readings in the pump from his One Touch meter  Continue Trulicity weekly  Regular walking and on the next visit will discuss using temporary basal if he is going to be doing brisk walking in springtime  We discussed trying to get him back on Invokana on the next visit  Will need to get his freestyle test strips prior authorized with his insurance  Patient education and instructions relayed through the interpreter    Counseling time on subjects discussed in assessment and plan sections is over 50% of today's 25 minute visit   Patient Instructions  Take 16-18 units at dinner for more carbs or larger meals  14 Units at lunch  Basal 1.2 at MN    Elayne Snare 12/21/2017, 8:25 AM   Note: This office note was prepared with Dragon voice recognition system technology. Any transcriptional errors that result from this process are unintentional.

## 2017-12-20 NOTE — Patient Instructions (Signed)
Take 16-18 units at dinner for more carbs or larger meals  14 Units at lunch  Basal 1.2 at MN

## 2018-01-06 ENCOUNTER — Other Ambulatory Visit: Payer: Self-pay | Admitting: Endocrinology

## 2018-01-25 ENCOUNTER — Telehealth: Payer: Self-pay | Admitting: Endocrinology

## 2018-01-25 MED ORDER — INSULIN LISPRO 100 UNIT/ML ~~LOC~~ SOLN: 65.0000 [IU] | Freq: Every day | SUBCUTANEOUS | 5 refills | Status: DC

## 2018-01-25 NOTE — Telephone Encounter (Signed)
Refill submitted. 

## 2018-01-25 NOTE — Telephone Encounter (Signed)
Patient need refill for insulin lispro (HUMALOG KWIKPEN) 100 UNIT/ML KiwkPen [[id number]]  [facility] [id number] - [address], [place] - [address] [facility] [address] Sh[address]& JULIAN DEA #:  [id number]

## 2018-01-26 ENCOUNTER — Telehealth: Payer: Self-pay | Admitting: Endocrinology

## 2018-01-26 NOTE — Telephone Encounter (Signed)
Patient is out of Humalog for his pump. He had refills at the pharmacy but the insurance will not cover for another three weeks.  daughter would like to know if there is away Dr can write a new prescription for this so he can get this refilled.  Prescription for now is 65 units daily.        [facility] [id number] - [place], [place] - [address] [facility]  & [address]

## 2018-01-27 ENCOUNTER — Telehealth: Payer: Self-pay | Admitting: Endocrinology

## 2018-01-27 MED ORDER — INSULIN LISPRO 100 UNIT/ML ~~LOC~~ SOLN: 90.0000 [IU] | Freq: Every day | SUBCUTANEOUS | 5 refills | Status: DC

## 2018-01-27 NOTE — Telephone Encounter (Signed)
He will be using 90 units/day in the pump on the new prescription for Humalog

## 2018-01-27 NOTE — Telephone Encounter (Signed)
Daughter calling to check status of previous message re: Refill request for Patient's Humalog. Ph# [phone]

## 2018-01-27 NOTE — Telephone Encounter (Signed)
Should this prescription be changed please advise

## 2018-01-27 NOTE — Telephone Encounter (Signed)
New Rx sent. See meds.  

## 2018-01-30 NOTE — Telephone Encounter (Signed)
Returned call. No answer.  

## 2018-02-03 ENCOUNTER — Other Ambulatory Visit: Payer: Self-pay | Admitting: Medical

## 2018-02-03 ENCOUNTER — Other Ambulatory Visit: Payer: Self-pay

## 2018-02-03 MED ORDER — INSULIN LISPRO 100 UNIT/ML ~~LOC~~ SOLN: 90.0000 [IU] | Freq: Every day | SUBCUTANEOUS | 5 refills | Status: DC

## 2018-02-16 ENCOUNTER — Ambulatory Visit: Payer: Medicare Other | Admitting: Medical

## 2018-02-16 ENCOUNTER — Telehealth: Payer: Self-pay | Admitting: Medical

## 2018-02-16 ENCOUNTER — Encounter: Payer: Self-pay | Admitting: Medical

## 2018-02-16 VITALS — BP 122/72 | HR 60 | Temp 98.5°F | Resp 16 | Ht 63.5 in | Wt 211.0 lb

## 2018-02-16 DIAGNOSIS — E785 Hyperlipidemia, unspecified: Secondary | ICD-10-CM | POA: Diagnosis not present

## 2018-02-16 DIAGNOSIS — G8929 Other chronic pain: Secondary | ICD-10-CM | POA: Diagnosis not present

## 2018-02-16 DIAGNOSIS — M5126 Other intervertebral disc displacement, lumbar region: Secondary | ICD-10-CM

## 2018-02-16 DIAGNOSIS — E119 Type 2 diabetes mellitus without complications: Secondary | ICD-10-CM | POA: Diagnosis not present

## 2018-02-16 DIAGNOSIS — Z794 Long term (current) use of insulin: Secondary | ICD-10-CM

## 2018-02-16 DIAGNOSIS — M4800 Spinal stenosis, site unspecified: Secondary | ICD-10-CM

## 2018-02-16 DIAGNOSIS — M5442 Lumbago with sciatica, left side: Secondary | ICD-10-CM | POA: Diagnosis not present

## 2018-02-16 LAB — LIPID PANEL
CHOL/HDL RATIO: 2
Cholesterol: 87 mg/dL (ref 0–200)
HDL: 41.7 mg/dL (ref >39.00)
LDL CALC: 29 mg/dL (ref 0–99)
NONHDL: 44.98
Triglycerides: 81 mg/dL (ref 0.0–149.0)
VLDL: 16.2 mg/dL (ref 0.0–40.0)

## 2018-02-16 LAB — COMPREHENSIVE METABOLIC PANEL
ALT: 25 U/L (ref 0–53)
AST: 22 U/L (ref 0–37)
Albumin: 4.2 g/dL (ref 3.5–5.2)
Alkaline Phosphatase: 40 U/L (ref 39–117)
BUN: 14 mg/dL (ref 6–23)
CHLORIDE: 104 meq/L (ref 96–112)
CO2: 30 mEq/L (ref 19–32)
Calcium: 9 mg/dL (ref 8.4–10.5)
Creatinine, Ser: 0.77 mg/dL (ref 0.40–1.50)
GFR: 107.23 mL/min (ref >60.00)
GLUCOSE: 132 mg/dL — AB (ref 70–99)
POTASSIUM: 4 meq/L (ref 3.5–5.1)
SODIUM: 140 meq/L (ref 135–145)
Total Bilirubin: 0.9 mg/dL (ref 0.2–1.2)
Total Protein: 6.8 g/dL (ref 6.0–8.3)

## 2018-02-16 MED ORDER — MELOXICAM 7.5 MG PO TABS: 7.5000 mg | ORAL_TABLET | Freq: Every day | ORAL | 0 refills | Status: DC

## 2018-02-16 MED ORDER — TRAMADOL HCL 50 MG PO TABS: 50.0000 mg | ORAL_TABLET | Freq: Four times a day (QID) | ORAL | 0 refills | Status: DC | PRN

## 2018-02-16 NOTE — Telephone Encounter (Signed)
Copied from CRM [phone]#[id number]. Topic: General - Other >> Feb 16, 2018 11:33 AM Cecelia ByarsGreen, Nakul Avino L, RMA wrote: Reason for CRM: Medication meloxicam (MOBIC) 7.5 MG tablet has been sent to wrong [facility] [place]

## 2018-02-16 NOTE — Patient Instructions (Signed)
For your back pain, I do recommend that you try to talk directly with surgeons nurse and pass the question to surgeon regarding your concerns.  Ask her directly what is your chance of good outcome.  Also ask if they think surgery will be more complicated/risky in future if surgery is delayed.  For back pain, I am refilling your meloxicam and tramadol.  You do use these medications sparingly and recommend continue to use sparingly.    Will get metabolic panel today and lipid panel.  See if any adjustment to lipid treatment is needed.  Follow-up with endocrinologist next week.   Follow-up with me in 3 months or as needed.

## 2018-02-16 NOTE — Progress Notes (Signed)
Subjective:    Patient ID: Samuel [name], male    DOB: November 15, 1950, 67 y.o.   MRN: [id number]  HPI  Pt in for follow up.    Pt has seen specialist for his back pain. He has seen Dr. Christella Noa.   I reviewed note of neurosurgeon with pt today. Explained to him as he is spanish speaker and did not understand   Pt is little hesitant with potential complications.  Pt not in severe pain. But mild moderate pain at times in back. Worse on left side. He feels some weakness in legs when stands. At times can't sleep due to pain.  Pt does had tramadol and meloxicam in the past. He used if he needs. But not using daily   Pt has high cholesterol and last check showed good cholesterol level.  Pt last a1-c was 7.6. He is seeing endocrinologist. Pt states he thinks appointment next week.      Review of Systems  Constitutional: Negative for chills, fatigue and fever.  Respiratory: Negative for cough, chest tightness, shortness of breath and wheezing.   Cardiovascular: Negative for chest pain and palpitations.  Gastrointestinal: Negative for abdominal pain, blood in stool, nausea and vomiting.  Musculoskeletal: Positive for back pain. Negative for arthralgias, joint swelling, neck pain and neck stiffness.  Skin: Negative for rash.  Neurological: Negative for dizziness, seizures, syncope, weakness and light-headedness.  Hematological: Negative for adenopathy. Does not bruise/bleed easily.  Psychiatric/Behavioral: Negative for behavioral problems, decreased concentration, sleep disturbance and suicidal ideas. The patient is not nervous/anxious.     Past Medical History:  Diagnosis Date  . Blood transfusion without reported diagnosis   . Diabetes mellitus without complication ([place])   . History of ETOH abuse   . Hypertension      Social History   Socioeconomic History  . Marital status: Married    Spouse name: Not on file  . Number of children: Not on file  . Years of education: Not on  file  . Highest education level: Not on file  Occupational History  . Not on file  Social Needs  . Financial resource strain: Not on file  . Food insecurity:    Worry: Not on file    Inability: Not on file  . Transportation needs:    Medical: Not on file    Non-medical: Not on file  Tobacco Use  . Smoking status: Former Research scientist (life sciences)  . Smokeless tobacco: Former Systems developer    Quit date: 11/08/1993  Substance and Sexual Activity  . Alcohol use: No    Alcohol/week: 0.0 oz  . Drug use: No  . Sexual activity: Not on file  Lifestyle  . Physical activity:    Days per week: Not on file    Minutes per session: Not on file  . Stress: Not on file  Relationships  . Social connections:    Talks on phone: Not on file    Gets together: Not on file    Attends religious service: Not on file    Active member of club or organization: Not on file    Attends meetings of clubs or organizations: Not on file    Relationship status: Not on file  . Intimate partner violence:    Fear of current or ex partner: Not on file    Emotionally abused: Not on file    Physically abused: Not on file    Forced sexual activity: Not on file  Other Topics Concern  . Not on file  Social History Narrative  . Not on file    Past Surgical History:  Procedure Laterality Date  . BACK SURGERY N/A 2010    Family History  Problem Relation Age of Onset  . Diabetes Unknown   . Hypertension Unknown   . Arthritis Father   . Diabetes Father     No Known Allergies  Current Outpatient Medications on File Prior to Visit  Medication Sig Dispense Refill  . ACCU-CHEK SOFTCLIX LANCETS lancets Use to check blood sugar 4 times per day. 400 each 2  . aspirin 325 MG tablet Take 81 mg by mouth daily.     [name] atorvastatin (LIPITOR) 40 MG tablet TAKE 1 TABLET BY MOUTH DAILY AND 1/2 TABLET BY MOUTH EVERY NIGHT AT BEDTIME 45 tablet 0  . B-D UF III MINI PEN NEEDLES 31G X 5 MM MISC USE FIVE PER DAY AS DIRECTED 200 each 0  . Blood Glucose  Monitoring Suppl (ACCU-CHEK AVIVA PLUS) w/Device KIT USE TO CHECK BLOOD SUGAR DAILY 1 kit 1  . cholecalciferol (VITAMIN D) 1000 UNITS tablet Take 1,000 Units by mouth daily. Reported on 11/05/2015    . Dulaglutide (TRULICITY) 9.47 SJ/6.2EZ SOPN INJECT 0.75 MG INTO THE SKIN ONCE A WEEK 4 pen 3  . glucose blood (ACCU-CHEK AVIVA PLUS) test strip Use as instructed 100 each 12  . insulin lispro (HUMALOG KWIKPEN) 100 UNIT/ML KiwkPen ADMINISTER 35 UNITS UNDER THE SKIN THREE TIMES DAILY BEFORE MEALS 30 mL 3  . insulin lispro (HUMALOG) 100 UNIT/ML injection Inject 0.9 mLs (90 Units total) into the skin daily. 30 mL 5  . Insulin Pen Needle 32G X 4 MM MISC Use 5 pens per day to inject insulin 200 each 3  . Insulin Syringe-Needle U-100 (INSULIN SYRINGE .5CC/30GX5/16") 30G X 5/16" 0.5 ML MISC Use 3 times a day for insulin 100 each 1  . losartan (COZAAR) 50 MG tablet TAKE 1 TABLET(50 MG) BY MOUTH DAILY 90 tablet 0  . meloxicam (MOBIC) 7.5 MG tablet TAKE 1 TO 2 TABLETS BY MOUTH EVERY DAY 90 tablet 0  . metFORMIN (GLUCOPHAGE) 1000 MG tablet TAKE 1 TABLET BY MOUTH TWICE DAILY WITH MEALS 180 tablet 1  . traMADol (ULTRAM) 50 MG tablet Take 1 tablet (50 mg total) by mouth every 8 (eight) hours as needed. 15 tablet 0   No current facility-administered medications on file prior to visit.     BP 122/72 (BP Location: Right Arm, Patient Position: Sitting, Cuff Size: Large)   Pulse 60   Temp 98.5 F (36.9 C) (Oral)   Resp 16   Ht 5' 3.5" (1.613 m)   Wt 211 lb (95.7 kg)   SpO2 96%   BMI 36.79 kg/m       Objective:   Physical Exam  General Appearance- Not in acute distress.    Chest and Lung Exam Auscultation: Breath sounds:-Normal. Clear even and unlabored. Adventitious sounds:- No Adventitious sounds.  Cardiovascular Auscultation:Rythm - Regular, rate and rythm. Heart Sounds -Normal heart sounds.  Abdomen Inspection:-Inspection Normal.  Palpation/Perucssion: Palpation and Percussion of the  abdomen reveal- Non Tender, No Rebound tenderness, No rigidity(Guarding) and No Palpable abdominal masses.  Liver:-Normal.  Spleen:- Normal.   Back Mid lumbar spine tenderness to palpation. Lt si area tender most on palpation. Pain on straight leg lift. Pain on lateral movements and flexion/extension of the spine.  Lower ext neurologic  L5-S1 sensation intact bilaterally. Normal patellar reflexes bilaterally. No foot drop bilaterally.     Assessment & Plan:  For  your back pain, I do recommend that you try to talk directly with surgeons nurse and pass the question to surgeon regarding your concerns.  Ask her directly what is your chance of good outcome.  Also ask if they think surgery will be more complicated/risky in future if surgery is delayed.  For back pain, I am refilling your meloxicam and tramadol.  You do use these medications sparingly and recommend continue to use sparingly.    Will get metabolic panel today and lipid panel.  See if any adjustment to lipid treatment is needed.  Follow-up with endocrinologist next week.   Follow-up with me in 3 months or as needed.  Mackie Pai, PA-C

## 2018-02-17 MED ORDER — MELOXICAM 7.5 MG PO TABS: 7.5000 mg | ORAL_TABLET | Freq: Every day | ORAL | 0 refills | Status: DC

## 2018-02-17 NOTE — Telephone Encounter (Signed)
Rx has been sent to the correct pharmacy  

## 2018-02-21 ENCOUNTER — Encounter (INDEPENDENT_AMBULATORY_CARE_PROVIDER_SITE_OTHER): Payer: Medicare Other | Admitting: Endocrinology

## 2018-02-21 ENCOUNTER — Encounter: Payer: Self-pay | Admitting: Endocrinology

## 2018-02-21 VITALS — BP 136/74 | HR 84 | Ht 63.5 in | Wt 211.6 lb

## 2018-02-21 LAB — POCT GLYCOSYLATED HEMOGLOBIN (HGB A1C): HEMOGLOBIN A1C: 7.8

## 2018-02-22 NOTE — Progress Notes (Signed)
This encounter was created in error - please disregard.

## 2018-02-23 ENCOUNTER — Encounter: Payer: Self-pay | Admitting: Endocrinology

## 2018-02-23 ENCOUNTER — Ambulatory Visit: Payer: Medicare Other | Admitting: Endocrinology

## 2018-02-23 VITALS — BP 136/76 | HR 64 | Ht 63.5 in | Wt 211.6 lb

## 2018-02-23 DIAGNOSIS — E1165 Type 2 diabetes mellitus with hyperglycemia: Secondary | ICD-10-CM | POA: Diagnosis not present

## 2018-02-23 DIAGNOSIS — Z794 Long term (current) use of insulin: Secondary | ICD-10-CM

## 2018-02-23 MED ORDER — CANAGLIFLOZIN-METFORMIN HCL ER 50-1000 MG PO TB24: 50.0000 mg | ORAL_TABLET | Freq: Every day | ORAL | 3 refills | Status: DC

## 2018-02-23 NOTE — Progress Notes (Signed)
Patient ID: Samuel [name], male   DOB: 04/05/1951, 67 y.o.   MRN: [id number]            Reason for Appointment:  Followup for Type 2 Diabetes  Referring physician: Saguier  History of Present Illness:          Diagnosis: Type 2 diabetes mellitus, date of diagnosis:  1990      Past history: He thinks he has been on insulin for the last 10-12 years. Previously had been on metformin which has been continued. He was probably tried on different insulin regimens initially but has been taking 70/30 mostly because of cost Previous records are not available and appears that his sugars have been poorly controlled for several years He was  referred here because of an A1c in 8/15 of 9.7%. He was on a regimen of Novolin 70/30 twice a day but because of inadequate control and higher readings later in the day he was switched to basal bolus insulin regimen in 12/15.  Recent history:   INSULIN regimen is: OMNIPOD INSULIN PUMP  Basal rate settings: Midnight = 1.1, 7 am: 1.5.  Boluses 12-14 at meals: Carbohydrate coverage 1:10 and correction 1:50  Previous insulin regimen: Humalog  ac meals 35-40-30 Toujeo 35-40 units daily   Non-insulin hypoglycemic drugs: metformin 1 g twice a day, Trulicity 1.5 mg weekly, not taking Invokana 100 mg daily     A1c has been below 8% the last 2 time but now slightly higher at 7.8  Current management, blood sugar patterns and problems identified:  He is not able to enter his blood sugars in the Omnipod pump until a couple of days ago when he shown how to do so  Not clear why his blood sugars are overall higher compared to the last visit  This may be from his continuing to leave off his Invokana he has various excuses for doing so, he says that it makes his urine foul-smelling and previously was complaining of sleepiness from this  However he still thinks he is taking Trulicity  Has no change in his weight recently  At times his blood sugars over 250 probably  from larger portions or carbohydrate  He does not know how to adjust his mealtime insulin based on his meal size  All the fasting readings are mostly high although he has had a few good readings also none blood sugars are trending higher between midday and evening  However yesterday he had a couple of good readings both at lunch and dinnertime        Side effects from medications have been: ?  Nausea/dizziness on Victoza  Compliance with the medical regimen: Fair   Glucose monitoring:  done 2-3 times a day         Glucometer:  One Touch ultra 2      Blood Glucose readings by download for the last 30 days show  Mean values apply above for all meters except median for One Touch  PRE-MEAL Fasting Lunch Dinner Bedtime Overall  Glucose range:  106-235   86-293    Mean/median:  182  149    191   POST-MEAL PC Breakfast PC Lunch PC Dinner  Glucose range:    142-388  Mean/median:   204  199   Previous readings:  Mean values apply above for all meters except median for One Touch  PRE-MEAL Fasting Lunch Dinner Bedtime Overall  Glucose range: 107-250    111-324  85-324  Mean/median:  159  137  155  180  160   POST-MEAL PC Breakfast PC Lunch PC Dinner  Glucose range:     Mean/median:  179  243    Self-care:  Variable portions, may have unbalanced meals  Meals: 3 meals per day. Breakfast at 8-9 am is various kinds of bread, milk and sometimes eggs. Lunch generally larger meal  Dinner at 6 pm     Dietician visit, most recent: 5/16 Last CDE visit: 12/2016                 Exercise: Walking daily somewhat limited by knee pain  Weight history: Previous range 200-220  Wt Readings from Last 3 Encounters:  02/23/18 211 lb 9.6 oz (96 kg)  02/21/18 211 lb 9.6 oz (96 kg)  02/16/18 211 lb (95.7 kg)    Glycemic control:   Lab Results  Component Value Date   HGBA1C 7.8 02/21/2018   HGBA1C 7.6 09/19/2017   HGBA1C 8.9 (H) 05/17/2017   Lab Results  Component Value Date   MICROALBUR  0.8 07/20/2017   LDLCALC 29 02/16/2018   CREATININE 0.77 02/16/2018    Lab Results  Component Value Date   FRUCTOSAMINE 302 (H) 07/20/2017       Allergies as of 02/23/2018   No Known Allergies     Medication List        Accurate as of 02/23/18  8:24 AM. Always use your most recent med list.          ACCU-CHEK AVIVA PLUS w/Device Kit USE TO CHECK BLOOD SUGAR DAILY   ACCU-CHEK SOFTCLIX LANCETS lancets Use to check blood sugar 4 times per day.   aspirin 325 MG tablet Take 81 mg by mouth daily.   atorvastatin 40 MG tablet Commonly known as:  LIPITOR TAKE 1 TABLET BY MOUTH DAILY AND 1/2 TABLET BY MOUTH EVERY NIGHT AT BEDTIME   cholecalciferol 1000 units tablet Commonly known as:  VITAMIN D Take 1,000 Units by mouth daily. Reported on 11/05/2015   Dulaglutide 0.75 MG/0.5ML Sopn Commonly known as:  TRULICITY INJECT 3.66 MG INTO THE SKIN ONCE A WEEK   glucose blood test strip Commonly known as:  ACCU-CHEK AVIVA PLUS Use as instructed   insulin lispro 100 UNIT/ML KiwkPen Commonly known as:  HUMALOG KWIKPEN ADMINISTER 35 UNITS UNDER THE SKIN THREE TIMES DAILY BEFORE MEALS   insulin lispro 100 UNIT/ML injection Commonly known as:  HUMALOG Inject 0.9 mLs (90 Units total) into the skin daily.   Insulin Pen Needle 32G X 4 MM Misc Use 5 pens per day to inject insulin   B-D UF III MINI PEN NEEDLES 31G X 5 MM Misc Generic drug:  Insulin Pen Needle USE FIVE PER DAY AS DIRECTED   INSULIN SYRINGE .5CC/30GX5/16" 30G X 5/16" 0.5 ML Misc Use 3 times a day for insulin   losartan 50 MG tablet Commonly known as:  COZAAR TAKE 1 TABLET(50 MG) BY MOUTH DAILY   meloxicam 7.5 MG tablet Commonly known as:  MOBIC Take 1-2 tablets (7.5-15 mg total) by mouth daily.   metFORMIN 1000 MG tablet Commonly known as:  GLUCOPHAGE TAKE 1 TABLET BY MOUTH TWICE DAILY WITH MEALS   traMADol 50 MG tablet Commonly known as:  ULTRAM Take 1 tablet (50 mg total) by mouth every 6 (six) hours  as needed.       Allergies: No Known Allergies  Past Medical History:  Diagnosis Date  . Blood transfusion without reported diagnosis   . Diabetes mellitus without  complication ([place])   . History of ETOH abuse   . Hypertension     Past Surgical History:  Procedure Laterality Date  . BACK SURGERY N/A 2010    Family History  Problem Relation Age of Onset  . Diabetes Unknown   . Hypertension Unknown   . Arthritis Father   . Diabetes Father     Social History:  reports that he has quit smoking. He quit smokeless tobacco use about 24 years ago. He reports that he does not drink alcohol or use drugs.    Review of Systems         Lipids:  He has been on Lipitor 40 mg for hypercholesterolemia since about 2011, lipid levels shows good control Also on low-dose fenofibrate Lipids followed by PCP        Lab Results  Component Value Date   CHOL 87 02/16/2018   HDL 41.70 02/16/2018   LDLCALC 29 02/16/2018   LDLDIRECT 40.0 02/13/2016   TRIG 81.0 02/16/2018   CHOLHDL 2 02/16/2018       The blood pressure has been treated with losartan 50 mg, prescribed by PCP  Last foot exam was in 05/2017 done by PCP       Physical Examination:  BP 136/76 (BP Location: Left Arm, Patient Position: Sitting, Cuff Size: Normal)   Pulse 64   Ht 5' 3.5" (1.613 m)   Wt 211 lb 9.6 oz (96 kg)   SpO2 94%   BMI 36.90 kg/m      ASSESSMENT:  Diabetes type 2, uncontrolled with obesity and BMI of 36  See history of present illness for  description of current diabetes management, blood sugar patterns and problems identified.[name]  His A1c is trending higher at 7.8 Also his blood sugars averaging about 30 mg more than on his last visit at home  This is likely from his not taking Invokana Also may be getting larger portions are more carbohydrate at certain meals with blood sugars as high as 388 Highest readings are generally after lunch or supper He is doing fairly well with bolusing with each  meal now However since he is often not entering his blood sugar at the time of the meal he is not doing correction doses  LIPIDS: Excellent control.  If he needs knee surgery later this year should be able to do this   PLAN:      Increase basal rate at midnight up to 1.3 and also from 5 PM onwards up to 1.6, the changes are made for him today  He was shown how to enter his blood sugars in the pump  Also the date on his pump was corrected.  For convenience he will take Invokamet instead of Invokana separately and hopefully he can tolerate the extended release preparation better  Discussed importance of using Invokana also especially with his tendency to maintain a higher weight  Since his blood pressure is high normal will not make adjustments in his losartan as yet with Invokana  He will increase his BOLUS doses by 2 to 4 units if eating a larger meal  Continue Trulicity weekly  Try to get the new controller for the Omnipod pump which will allow him to check with any meter, currently not able to get the freestyle test strips  Counseling time on subjects discussed in assessment and plan sections is over 50% of today's 25 minute visit    There are no Patient Instructions on file for this visit.   Vicenta Aly  Trenae Brunke 02/23/2018, 8:24 AM   Note: This office note was prepared with Dragon voice recognition system technology. Any transcriptional errors that result from this process are unintentional.

## 2018-02-23 NOTE — Patient Instructions (Signed)
May increase bolus dose by 2-4 units for larger meals  With Invokamet stop metformin

## 2018-04-03 ENCOUNTER — Other Ambulatory Visit: Payer: Self-pay | Admitting: Endocrinology

## 2018-04-04 ENCOUNTER — Other Ambulatory Visit: Payer: Self-pay | Admitting: Endocrinology

## 2018-04-07 ENCOUNTER — Telehealth: Payer: Self-pay

## 2018-04-07 ENCOUNTER — Telehealth: Payer: Self-pay | Admitting: Endocrinology

## 2018-04-07 NOTE — Telephone Encounter (Signed)
[facility] is calling in regards to a PA for patients freestyle test strips. They need clarification on how many times patient test daily  [facility]  [phone]

## 2018-04-07 NOTE — Telephone Encounter (Signed)
PA submitted.

## 2018-04-07 NOTE — Telephone Encounter (Signed)
PA submitted via CoverMyMeds for patient to receive Freestyle Libre test strips.  Key: [id number]

## 2018-04-10 ENCOUNTER — Telehealth: Payer: Self-pay

## 2018-04-10 NOTE — Telephone Encounter (Signed)
Received fax from [employer] stating that patient has been approved for Freestle Tes Lite under part B benefit through 11/07/2018.

## 2018-04-17 ENCOUNTER — Other Ambulatory Visit: Payer: Self-pay | Admitting: Medical

## 2018-04-25 ENCOUNTER — Ambulatory Visit (INDEPENDENT_AMBULATORY_CARE_PROVIDER_SITE_OTHER): Payer: Medicare Other | Admitting: Medical

## 2018-04-25 ENCOUNTER — Ambulatory Visit (HOSPITAL_BASED_OUTPATIENT_CLINIC_OR_DEPARTMENT_OTHER)
Admission: RE | Admit: 2018-04-25 | Discharge: 2018-04-25 | Disposition: A | Discharge status: Home / Self Care | Payer: Medicare Other | Source: Ambulatory Visit | Attending: Medical | Admitting: Medical

## 2018-04-25 ENCOUNTER — Encounter: Payer: Self-pay | Admitting: Medical

## 2018-04-25 VITALS — BP 130/70 | HR 65 | Temp 98.2°F | Resp 16 | Ht 63.5 in | Wt 203.8 lb

## 2018-04-25 DIAGNOSIS — J40 Bronchitis, not specified as acute or chronic: Secondary | ICD-10-CM

## 2018-04-25 DIAGNOSIS — R059 Cough, unspecified: Secondary | ICD-10-CM

## 2018-04-25 DIAGNOSIS — J0100 Acute maxillary sinusitis, unspecified: Secondary | ICD-10-CM | POA: Diagnosis not present

## 2018-04-25 DIAGNOSIS — R05 Cough: Secondary | ICD-10-CM | POA: Diagnosis not present

## 2018-04-25 MED ORDER — CEFTRIAXONE SODIUM 1 G IJ SOLR
1.0000 g | Freq: Once | INTRAMUSCULAR | Status: AC
  Administered 2018-04-25: 1 g via INTRAMUSCULAR

## 2018-04-25 MED ORDER — CEFDINIR 300 MG PO CAPS: 300.0000 mg | ORAL_CAPSULE | Freq: Two times a day (BID) | ORAL | 0 refills | Status: DC

## 2018-04-25 NOTE — Progress Notes (Signed)
Subjective:    Patient ID: Samuel [name], male    DOB: 19-Mar-1951, 67 y.o.   MRN: [id number]  HPI  Pt in with nasal and chest congestion for 6 days.  Pt had cxr that showed atelectasis vs less likely infiltrates.  Pt was given benzonatate and azithromycin. On med for about 2 days. He does not feel a whole lot better.  Cough is mildly productive.   When he blows nose will get some color discharge.  Pt was seen in ED on June 16,2019.   Review of Systems  Constitutional: Positive for fatigue. Negative for chills and fever.  HENT: Positive for congestion, sinus pressure and sinus pain. Negative for sore throat.   Respiratory: Positive for cough. Negative for chest tightness, shortness of breath and wheezing.   Cardiovascular: Negative for chest pain and palpitations.  Gastrointestinal: Negative for abdominal pain.  Musculoskeletal: Negative for back pain.  Skin: Negative for rash.  Neurological: Negative for dizziness, weakness and numbness.  Hematological: Negative for adenopathy. Does not bruise/bleed easily.  Psychiatric/Behavioral: Negative for behavioral problems and confusion.    Past Medical History:  Diagnosis Date  . Blood transfusion without reported diagnosis   . Diabetes mellitus without complication ([place])   . History of ETOH abuse   . Hypertension      Social History   Socioeconomic History  . Marital status: Married    Spouse name: Not on file  . Number of children: Not on file  . Years of education: Not on file  . Highest education level: Not on file  Occupational History  . Not on file  Social Needs  . Financial resource strain: Not on file  . Food insecurity:    Worry: Not on file    Inability: Not on file  . Transportation needs:    Medical: Not on file    Non-medical: Not on file  Tobacco Use  . Smoking status: Former Research scientist (life sciences)  . Smokeless tobacco: Former Systems developer    Quit date: 11/08/1993  Substance and Sexual Activity  . Alcohol use: No   Alcohol/week: 0.0 oz  . Drug use: No  . Sexual activity: Not on file  Lifestyle  . Physical activity:    Days per week: Not on file    Minutes per session: Not on file  . Stress: Not on file  Relationships  . Social connections:    Talks on phone: Not on file    Gets together: Not on file    Attends religious service: Not on file    Active member of club or organization: Not on file    Attends meetings of clubs or organizations: Not on file    Relationship status: Not on file  . Intimate partner violence:    Fear of current or ex partner: Not on file    Emotionally abused: Not on file    Physically abused: Not on file    Forced sexual activity: Not on file  Other Topics Concern  . Not on file  Social History Narrative  . Not on file    Past Surgical History:  Procedure Laterality Date  . BACK SURGERY N/A 2010    Family History  Problem Relation Age of Onset  . Diabetes Unknown   . Hypertension Unknown   . Arthritis Father   . Diabetes Father     No Known Allergies  Current Outpatient Medications on File Prior to Visit  Medication Sig Dispense Refill  . ACCU-CHEK SOFTCLIX LANCETS lancets Use  to check blood sugar 4 times per day. 400 each 2  . aspirin 325 MG tablet Take 81 mg by mouth daily.     [name] atorvastatin (LIPITOR) 40 MG tablet TAKE 1 TABLET BY MOUTH DAILY AND 1/2 TABLET BY MOUTH EVERY NIGHT AT BEDTIME 45 tablet 0  . atorvastatin (LIPITOR) 40 MG tablet TAKE 1 TABLET BY MOUTH DAILY AND 1/2 TABLET BY MOUTH EVERY NIGHT AT BEDTIME 45 tablet 0  . azithromycin (ZITHROMAX) 250 MG tablet Take by mouth.    . B-D UF III MINI PEN NEEDLES 31G X 5 MM MISC USE FIVE PER DAY AS DIRECTED 200 each 0  . benzonatate (TESSALON) 100 MG capsule TK ONE C PO TID PRN COU  0  . Blood Glucose Monitoring Suppl (ACCU-CHEK AVIVA PLUS) w/Device KIT USE TO CHECK BLOOD SUGAR DAILY 1 kit 1  . Canagliflozin-metFORMIN HCl ER (INVOKAMET XR) 50-1000 MG TB24 Take 50-1,000 mg by mouth daily. Take 2  tablets one time a day. 60 tablet 3  . cholecalciferol (VITAMIN D) 1000 UNITS tablet Take 1,000 Units by mouth daily. Reported on 11/05/2015    . Dulaglutide (TRULICITY) 7.54 GB/2.0FE SOPN INJECT 0.75 MG INTO THE SKIN ONCE A WEEK 4 pen 3  . glucose blood (ACCU-CHEK AVIVA PLUS) test strip Use as instructed 100 each 12  . glucose blood (ONE TOUCH ULTRA TEST) test strip USE TO TEST BLOOD SUGAR THREE TIMES DAILY AS DIRECTED 200 each 2  . insulin lispro (HUMALOG KWIKPEN) 100 UNIT/ML KiwkPen ADMINISTER 35 UNITS UNDER THE SKIN THREE TIMES DAILY BEFORE MEALS 30 mL 3  . insulin lispro (HUMALOG) 100 UNIT/ML injection Inject 0.9 mLs (90 Units total) into the skin daily. 30 mL 5  . Insulin Pen Needle 32G X 4 MM MISC Use 5 pens per day to inject insulin 200 each 3  . Insulin Syringe-Needle U-100 (INSULIN SYRINGE .5CC/30GX5/16") 30G X 5/16" 0.5 ML MISC Use 3 times a day for insulin 100 each 1  . losartan (COZAAR) 50 MG tablet TAKE 1 TABLET(50 MG) BY MOUTH DAILY 90 tablet 0  . meloxicam (MOBIC) 7.5 MG tablet Take 1-2 tablets (7.5-15 mg total) by mouth daily. 90 tablet 0  . traMADol (ULTRAM) 50 MG tablet Take 1 tablet (50 mg total) by mouth every 6 (six) hours as needed. 20 tablet 0   No current facility-administered medications on file prior to visit.     BP 130/70 (BP Location: Right Arm, Patient Position: Sitting, Cuff Size: Large)   Pulse 65   Temp 98.2 F (36.8 C) (Oral)   Resp 16   Ht 5' 3.5" (1.613 m)   Wt 203 lb 12.8 oz (92.4 kg)   SpO2 95%   BMI 35.54 kg/m       Objective:   Physical Exam  General  Mental Status - Alert. General Appearance - Well groomed. Not in acute distress.  Skin Rashes- No Rashes.  HEENT Head- Normal. Ear Auditory Canal - Left- Normal. Right - Normal.Tympanic Membrane- Left- Normal. Right- Normal. Eye Sclera/Conjunctiva- Left- Normal. Right- Normal. Nose & Sinuses Nasal Mucosa- Left-  Boggy and Congested. Right-  Boggy and  Congested.Bilateral maxillary and  frontal sinus pressure. Mouth & Throat Lips: Upper Lip- Normal: no dryness, cracking, pallor, cyanosis, or vesicular eruption. Lower Lip-Normal: no dryness, cracking, pallor, cyanosis or vesicular eruption. Buccal Mucosa- Bilateral- No Aphthous ulcers. Oropharynx- No Discharge or Erythema. Tonsils: Characteristics- Bilateral- No Erythema or Congestion. Size/Enlargement- Bilateral- No enlargement. Discharge- bilateral-None.  Neck Neck- Supple. No Masses.  Chest and Lung Exam Auscultation: Breath Sounds:- even and unlabored.But very rough breath sounds at left lung base.  Cardiovascular Auscultation:Rythm- Regular, rate and rhythm. Murmurs & Other Heart Sounds:Ausculatation of the heart reveal- No Murmurs.  Lymphatic Head & Neck General Head & Neck Lymphatics: Bilateral: Description- No Localized lymphadenopathy.       Assessment & Plan:  You do have probable bronchitis and sinusitis. But on exam/ausculation of lungs concern for left lower lobe pneumonia. Also you report not feeling better despite azithromycin.  We gave you rocephin 1 gram im today.  Cxr today to assess if interval worsening since last cxr 2 days ago.  Continue azithromycin and benzonatate. Add cefdnir antibiotic.  Rx of albuterol. Use if any wheezing as described.  Follow up in 7-10 days or as needed  [employer], [employer]

## 2018-04-25 NOTE — Patient Instructions (Signed)
You do have probable bronchitis and sinusitis. But on exam/ausculation of lungs concern for left lower lobe pneumonia. Also you report not feeling better despite azithromycin.  We gave you rocephin 1 gram im today.  Cxr today to assess if interval worsening since last cxr 2 days ago.  Continue azithromycin and benzonatate. Add cefdnir antibiotic.  Rx of albuterol. Use if any wheezing as described.  Follow up in 7-10 days or as needed

## 2018-05-01 ENCOUNTER — Other Ambulatory Visit: Payer: Self-pay

## 2018-05-01 MED ORDER — OMNIPOD CLASSIC PODS (GEN 3) MISC: 1.0000 | Freq: Every day | 3 refills | Status: DC

## 2018-05-04 ENCOUNTER — Telehealth: Payer: Self-pay | Admitting: Endocrinology

## 2018-05-04 NOTE — Telephone Encounter (Signed)
Patient daughter stated patient need a new prescription for his Humalog 100 units (she stated he is running out of insulin to soon, and don't know why). Please advise  [facility]  [phone] [place] - [address] [facility] Jacquenette Shone& JULIAN DEA #:  [id number]

## 2018-05-05 ENCOUNTER — Other Ambulatory Visit: Payer: Self-pay

## 2018-05-05 MED ORDER — INSULIN LISPRO 100 UNIT/ML ~~LOC~~ SOLN: 90.0000 [IU] | Freq: Every day | SUBCUTANEOUS | 5 refills | Status: DC

## 2018-05-05 NOTE — Telephone Encounter (Signed)
Rx sent to pharmacy per pt request 

## 2018-05-18 ENCOUNTER — Encounter: Payer: Self-pay | Admitting: Medical

## 2018-05-18 ENCOUNTER — Ambulatory Visit (INDEPENDENT_AMBULATORY_CARE_PROVIDER_SITE_OTHER): Payer: Medicare Other | Admitting: Medical

## 2018-05-18 VITALS — BP 114/62 | HR 65 | Temp 98.6°F | Resp 16 | Ht 63.5 in | Wt 206.8 lb

## 2018-05-18 DIAGNOSIS — Z23 Encounter for immunization: Secondary | ICD-10-CM

## 2018-05-18 DIAGNOSIS — E785 Hyperlipidemia, unspecified: Secondary | ICD-10-CM | POA: Diagnosis not present

## 2018-05-18 DIAGNOSIS — E119 Type 2 diabetes mellitus without complications: Secondary | ICD-10-CM

## 2018-05-18 DIAGNOSIS — M4800 Spinal stenosis, site unspecified: Secondary | ICD-10-CM | POA: Diagnosis not present

## 2018-05-18 DIAGNOSIS — I10 Essential (primary) hypertension: Secondary | ICD-10-CM | POA: Diagnosis not present

## 2018-05-18 DIAGNOSIS — Z794 Long term (current) use of insulin: Secondary | ICD-10-CM | POA: Diagnosis not present

## 2018-05-18 NOTE — Progress Notes (Signed)
Subjective:    Patient ID: Samuel [name], male    DOB: [birth year], 67 y.o.   MRN: [id number]  HPI  Pt in for follow up.  Pt has appointment with endocrinologist next week. Last a1c 7.8.  Pt bp is well controlled. Compliant on his medication.  He has chronic back pain. He has been seen by neurosurgeon and plan in place to get surgery. But date to do is pending.     Review of Systems  Constitutional: Negative for chills, fatigue and fever.  Respiratory: Negative for cough, chest tightness, shortness of breath and wheezing.   Gastrointestinal: Negative for abdominal distention, abdominal pain and constipation.  Musculoskeletal: Positive for back pain.  Skin: Negative for rash.  Neurological: Negative for dizziness, speech difficulty, weakness, light-headedness, numbness and headaches.  Hematological: Negative for adenopathy. Does not bruise/bleed easily.  Psychiatric/Behavioral: Negative for behavioral problems, confusion, self-injury and sleep disturbance.   Past Medical History:  Diagnosis Date  . Blood transfusion without reported diagnosis   . Diabetes mellitus without complication ([place])   . History of ETOH abuse   . Hypertension      Social History   Socioeconomic History  . Marital status: Married    Spouse name: Not on file  . Number of children: Not on file  . Years of education: Not on file  . Highest education level: Not on file  Occupational History  . Not on file  Social Needs  . Financial resource strain: Not on file  . Food insecurity:    Worry: Not on file    Inability: Not on file  . Transportation needs:    Medical: Not on file    Non-medical: Not on file  Tobacco Use  . Smoking status: Former Research scientist (life sciences)  . Smokeless tobacco: Former Systems developer    Quit date: 11/08/1993  Substance and Sexual Activity  . Alcohol use: No    Alcohol/week: 0.0 oz  . Drug use: No  . Sexual activity: Not on file  Lifestyle  . Physical activity:    Days per week: Not on file      Minutes per session: Not on file  . Stress: Not on file  Relationships  . Social connections:    Talks on phone: Not on file    Gets together: Not on file    Attends religious service: Not on file    Active member of club or organization: Not on file    Attends meetings of clubs or organizations: Not on file    Relationship status: Not on file  . Intimate partner violence:    Fear of current or ex partner: Not on file    Emotionally abused: Not on file    Physically abused: Not on file    Forced sexual activity: Not on file  Other Topics Concern  . Not on file  Social History Narrative  . Not on file    Past Surgical History:  Procedure Laterality Date  . BACK SURGERY N/A 2010    Family History  Problem Relation Age of Onset  . Diabetes Unknown   . Hypertension Unknown   . Arthritis Father   . Diabetes Father     No Known Allergies  Current Outpatient Medications on File Prior to Visit  Medication Sig Dispense Refill  . ACCU-CHEK SOFTCLIX LANCETS lancets Use to check blood sugar 4 times per day. 400 each 2  . aspirin 325 MG tablet Take 81 mg by mouth daily.     [name]  atorvastatin (LIPITOR) 40 MG tablet TAKE 1 TABLET BY MOUTH DAILY AND 1/2 TABLET BY MOUTH EVERY NIGHT AT BEDTIME 45 tablet 0  . atorvastatin (LIPITOR) 40 MG tablet TAKE 1 TABLET BY MOUTH DAILY AND 1/2 TABLET BY MOUTH EVERY NIGHT AT BEDTIME 45 tablet 0  . B-D UF III MINI PEN NEEDLES 31G X 5 MM MISC USE FIVE PER DAY AS DIRECTED 200 each 0  . benzonatate (TESSALON) 100 MG capsule TK ONE C PO TID PRN COU  0  . Blood Glucose Monitoring Suppl (ACCU-CHEK AVIVA PLUS) w/Device KIT USE TO CHECK BLOOD SUGAR DAILY 1 kit 1  . Canagliflozin-metFORMIN HCl ER (INVOKAMET XR) 50-1000 MG TB24 Take 50-1,000 mg by mouth daily. Take 2 tablets one time a day. 60 tablet 3  . cefdinir (OMNICEF) 300 MG capsule Take 1 capsule (300 mg total) by mouth 2 (two) times daily. 20 capsule 0  . cholecalciferol (VITAMIN D) 1000 UNITS tablet  Take 1,000 Units by mouth daily. Reported on 11/05/2015    . Dulaglutide (TRULICITY) 6.23 JS/2.8BT SOPN INJECT 0.75 MG INTO THE SKIN ONCE A WEEK 4 pen 3  . glucose blood (ACCU-CHEK AVIVA PLUS) test strip Use as instructed 100 each 12  . glucose blood (ONE TOUCH ULTRA TEST) test strip USE TO TEST BLOOD SUGAR THREE TIMES DAILY AS DIRECTED 200 each 2  . Insulin Disposable Pump (OMNIPOD 5 PACK) MISC 1 each by Does not apply route daily. Apply 1 OmniPod to body one time every 3 days. 5 each 3  . insulin lispro (HUMALOG KWIKPEN) 100 UNIT/ML KiwkPen ADMINISTER 35 UNITS UNDER THE SKIN THREE TIMES DAILY BEFORE MEALS 30 mL 3  . insulin lispro (HUMALOG) 100 UNIT/ML injection Inject 0.9 mLs (90 Units total) into the skin daily. 40 mL 5  . Insulin Pen Needle 32G X 4 MM MISC Use 5 pens per day to inject insulin 200 each 3  . Insulin Syringe-Needle U-100 (INSULIN SYRINGE .5CC/30GX5/16") 30G X 5/16" 0.5 ML MISC Use 3 times a day for insulin 100 each 1  . losartan (COZAAR) 50 MG tablet TAKE 1 TABLET(50 MG) BY MOUTH DAILY 90 tablet 0  . meloxicam (MOBIC) 7.5 MG tablet Take 1-2 tablets (7.5-15 mg total) by mouth daily. 90 tablet 0  . traMADol (ULTRAM) 50 MG tablet Take 1 tablet (50 mg total) by mouth every 6 (six) hours as needed. 20 tablet 0   No current facility-administered medications on file prior to visit.     BP 114/62   Pulse 65   Temp 98.6 F (37 C) (Oral)   Resp 16   Ht 5' 3.5" (1.613 m)   Wt 206 lb 12.8 oz (93.8 kg)   SpO2 95%   BMI 36.06 kg/m       Objective:   Physical Exam   General Mental Status- Alert. General Appearance- Not in acute distress.   Skin General: Color- Normal Color. Moisture- Normal Moisture.  Neck Carotid Arteries- Normal color. Moisture- Normal Moisture. No carotid bruits. No JVD  Lung Exam Clear, even and unlabored.  Cardiovascular Auscultation:Rythm- Regular. Murmurs & Other Heart Sounds:Auscultation of the heart reveals- No  Murmurs.  Abdomen Inspection:-Inspeection Normal. Palpation/Percussion:Note:No mass. Palpation and Percussion of the abdomen reveal- Non Tender, Non Distended + BS, no rebound or guarding.     Neurologic Cranial Nerve exam:- CN III-XII intact(No nystagmus), symmetric smile. Strength:- 5/5 equal and symmetric strength both upper and lower extremities.  Lower ext- see quality metrics,    Assessment & Plan:  You  do appear to be doing fairly well with diabetes.  Last A1c was 7.8.  Continue with current regimen prescribed by endocrinologist.  Some room for improvement and think A1c between 6.5 and 7.0 would be ideal.  Your lipids have been well controlled historically recently.  Continue current medication regimen.  Placed future lipid panel order that you can get done in the next week.  Blood pressure has also been controlled recently.  Continue current regimen as well.  Get metabolic panel when lipid panel drawn.  You have seen neurosurgeon for your chronic lower back pain.  We discussed that consult visit today.  Advised get back with neurosurgeon/call his office after you decide if you want surgery done.  Follow-up date to be determined after lab review.  Mackie Pai, PA-C

## 2018-05-18 NOTE — Patient Instructions (Addendum)
You do appear to be doing fairly well with diabetes.  Last A1c was 7.8.  Continue with current regimen prescribed by endocrinologist.  Some room for improvement and think A1c between 6.5 and 7.0 would be ideal.  Your lipids have been well controlled historically recently.  Continue current medication regimen.  Placed future lipid panel order that you can get done in the next week.  Blood pressure has also been controlled recently.  Continue current regimen as well.  Get metabolic panel when lipid panel drawn.  You have seen neurosurgeon for your chronic lower back pain.  We discussed that consult visit today. Advised get back with neurosurgeon/call his office after you decide if you want surgery done.  Follow-up date to be determined after lab review.

## 2018-05-21 ENCOUNTER — Other Ambulatory Visit: Payer: Self-pay | Admitting: Endocrinology

## 2018-05-21 DIAGNOSIS — E1165 Type 2 diabetes mellitus with hyperglycemia: Secondary | ICD-10-CM

## 2018-05-21 DIAGNOSIS — Z794 Long term (current) use of insulin: Principal | ICD-10-CM

## 2018-05-22 ENCOUNTER — Other Ambulatory Visit (INDEPENDENT_AMBULATORY_CARE_PROVIDER_SITE_OTHER): Payer: Medicare Other

## 2018-05-22 DIAGNOSIS — Z794 Long term (current) use of insulin: Secondary | ICD-10-CM

## 2018-05-22 DIAGNOSIS — E1165 Type 2 diabetes mellitus with hyperglycemia: Secondary | ICD-10-CM

## 2018-05-22 LAB — COMPREHENSIVE METABOLIC PANEL
ALK PHOS: 47 U/L (ref 39–117)
ALT: 21 U/L (ref 0–53)
AST: 21 U/L (ref 0–37)
Albumin: 4.3 g/dL (ref 3.5–5.2)
BUN: 22 mg/dL (ref 6–23)
CO2: 27 mEq/L (ref 19–32)
Calcium: 9.2 mg/dL (ref 8.4–10.5)
Chloride: 103 mEq/L (ref 96–112)
Creatinine, Ser: 0.98 mg/dL (ref 0.40–1.50)
GFR: 81.11 mL/min (ref >60.00)
GLUCOSE: 175 mg/dL — AB (ref 70–99)
POTASSIUM: 4.4 meq/L (ref 3.5–5.1)
Sodium: 139 mEq/L (ref 135–145)
TOTAL PROTEIN: 7.1 g/dL (ref 6.0–8.3)
Total Bilirubin: 0.6 mg/dL (ref 0.2–1.2)

## 2018-05-22 LAB — MICROALBUMIN / CREATININE URINE RATIO
Creatinine,U: 80.8 mg/dL
MICROALB/CREAT RATIO: 0.9 mg/g (ref 0.0–30.0)
Microalb, Ur: <0.7 mg/dL (ref 0.0–1.9)

## 2018-05-22 LAB — HEMOGLOBIN A1C: Hgb A1c MFr Bld: 7.9 % — ABNORMAL HIGH (ref 4.6–6.5)

## 2018-05-25 ENCOUNTER — Telehealth: Payer: Self-pay | Admitting: Emergency Medicine

## 2018-05-25 NOTE — Telephone Encounter (Signed)
Kae from [employer] called about patients PA. She asked that you give her a call back on phone number [phone] Ext [phone].

## 2018-05-25 NOTE — Telephone Encounter (Signed)
Called [facility] and they are refaxing the paperwork back to this office so a PA can be done for the pt.

## 2018-05-26 ENCOUNTER — Other Ambulatory Visit (INDEPENDENT_AMBULATORY_CARE_PROVIDER_SITE_OTHER): Payer: Medicare Other

## 2018-05-26 DIAGNOSIS — E119 Type 2 diabetes mellitus without complications: Secondary | ICD-10-CM | POA: Diagnosis not present

## 2018-05-26 DIAGNOSIS — I10 Essential (primary) hypertension: Secondary | ICD-10-CM

## 2018-05-26 DIAGNOSIS — E785 Hyperlipidemia, unspecified: Secondary | ICD-10-CM

## 2018-05-26 DIAGNOSIS — Z794 Long term (current) use of insulin: Secondary | ICD-10-CM

## 2018-05-26 LAB — COMPREHENSIVE METABOLIC PANEL
ALT: 21 U/L (ref 0–53)
AST: 17 U/L (ref 0–37)
Albumin: 4.1 g/dL (ref 3.5–5.2)
Alkaline Phosphatase: 47 U/L (ref 39–117)
BUN: 14 mg/dL (ref 6–23)
CHLORIDE: 106 meq/L (ref 96–112)
CO2: 28 meq/L (ref 19–32)
Calcium: 9 mg/dL (ref 8.4–10.5)
Creatinine, Ser: 0.8 mg/dL (ref 0.40–1.50)
GFR: 102.51 mL/min (ref >60.00)
GLUCOSE: 115 mg/dL — AB (ref 70–99)
POTASSIUM: 3.7 meq/L (ref 3.5–5.1)
SODIUM: 141 meq/L (ref 135–145)
Total Bilirubin: 0.5 mg/dL (ref 0.2–1.2)
Total Protein: 6.8 g/dL (ref 6.0–8.3)

## 2018-05-26 LAB — LIPID PANEL
Cholesterol: 86 mg/dL (ref 0–200)
HDL: 38 mg/dL — ABNORMAL LOW (ref >39.00)
LDL CALC: 20 mg/dL (ref 0–99)
NONHDL: 47.66
Total CHOL/HDL Ratio: 2
Triglycerides: 138 mg/dL (ref 0.0–149.0)
VLDL: 27.6 mg/dL (ref 0.0–40.0)

## 2018-05-31 NOTE — Progress Notes (Signed)
Patient ID: Samuel [name], male   DOB: 18-May-1951, 67 y.o.   MRN: [id number]            Reason for Appointment:  Followup for Type 2 Diabetes  Referring physician: S[place]ier  History of Present Illness:          Diagnosis: Type 2 diabetes mellitus, date of diagnosis:  1990      Past history: He thinks he has been on insulin for the last 10-12 years. Previously had been on metformin which has been continued. He was probably tried on different insulin regimens initially but has been taking 70/30 mostly because of cost Previous records are not available and appears that his sugars have been poorly controlled for several years He was  referred here because of an A1c in 8/15 of 9.7%. He was on a regimen of Novolin 70/30 twice a day but because of inadequate control and higher readings later in the day he was switched to basal bolus insulin regimen in 12/15. Previous insulin regimen: Humalog  ac meals 35-40-30 Toujeo 35-40 units daily  Recent history:   INSULIN regimen is: OMNIPOD INSULIN PUMP  Basal rate settings: Midnight = 1.3, 6am: 1.5, 5 PM = 1.6.  Boluses 12 units breakfast -14 units lunch and 18 units at dinner at meals: Carbohydrate coverage 1:10 and correction 1:50   Non-insulin hypoglycemic drugs: metformin 1 g twice a day, Trulicity 1.5 mg weekly,Invokamet 50/1000  A1c has been below 8% but now gradually going up and 7.9 this month   Current management, blood sugar patterns and problems identified:  He is not taking his blood sugars and often recently mostly in the mornings  Even though his home blood sugars are averaging better than last time his A1c is not improved  His blood sugars are reasonably good in the mornings but occasionally high  His basal rate at night was increased on the last visit  However blood sugar is appear to be mostly high later in the day especially at lunch and dinnertime  Blood sugars are somewhat variable after supper but not consistently high     Again he does not know how to increase or decrease his mealtime dose if he is having variable portions or carbohydrate intake  In the morning he may have a small slice of bread with the tortilla and eggs  Recently has been trying to bolus at the time of his meals, may be sometime late  Always checking blood sugars in the morning but frequently not doing at the time of his bolus at lunch or dinner   he has been trying to do some walking  Has lost weight        Side effects from medications have been: ?  Nausea/dizziness on Victoza  Compliance with the medical regimen: Fair   Glucose monitoring:  done 2-3 times a day         Glucometer:  Freestyle/Omnipod      Blood Glucose readings by download for the last 30 days show  Mean values apply above for all meters except median for One Touch  PRE-MEAL Fasting Lunch Dinner Bedtime Overall  Glucose range:  99-175  208-217  157-221    Mean/median:      167+/-48   POST-MEAL PC Breakfast PC Lunch PC Dinner  Glucose range:    79-296  Mean/median:       PREVIOUS readings:  Mean values apply above for all meters except median for One Touch  PRE-MEAL Fasting Lunch Dinner  Bedtime Overall  Glucose range:  106-235   86-293    Mean/median:  182  149    191   POST-MEAL PC Breakfast PC Lunch PC Dinner  Glucose range:    142-388  Mean/median:   204  199     Self-care:  Variable portions, may have unbalanced meals  Meals: 3 meals per day. Breakfast at 8-9 am is various kinds of bread, milk and sometimes eggs. Lunch generally larger meal  Dinner at 6 pm     Dietician visit, most recent: 5/16 Last CDE visit: 12/2016                 Exercise: Walking daily somewhat limited by knee pain  Weight history: Previous range 200-220  Wt Readings from Last 3 Encounters:  06/01/18 205 lb (93 kg)  05/18/18 206 lb 12.8 oz (93.8 kg)  04/25/18 203 lb 12.8 oz (92.4 kg)    Glycemic control:   Lab Results  Component Value Date   HGBA1C 7.9  (H) 05/22/2018   HGBA1C 7.8 02/21/2018   HGBA1C 7.6 09/19/2017   Lab Results  Component Value Date   MICROALBUR <0.7 05/22/2018   LDLCALC 20 05/26/2018   CREATININE 0.80 05/26/2018    Lab Results  Component Value Date   FRUCTOSAMINE 302 (H) 07/20/2017       Allergies as of 06/01/2018   No Known Allergies     Medication List        Accurate as of 06/01/18  3:35 PM. Always use your most recent med list.          ACCU-CHEK AVIVA PLUS w/Device Kit USE TO CHECK BLOOD SUGAR DAILY   ACCU-CHEK SOFTCLIX LANCETS lancets Use to check blood sugar 4 times per day.   aspirin 325 MG tablet Take 81 mg by mouth daily.   atorvastatin 40 MG tablet Commonly known as:  LIPITOR TAKE 1 TABLET BY MOUTH DAILY AND 1/2 TABLET BY MOUTH EVERY NIGHT AT BEDTIME   atorvastatin 40 MG tablet Commonly known as:  LIPITOR TAKE 1 TABLET BY MOUTH DAILY AND 1/2 TABLET BY MOUTH EVERY NIGHT AT BEDTIME   Canagliflozin-metFORMIN HCl ER 50-1000 MG Tb24 Commonly known as:  INVOKAMET XR Take 50-1,000 mg by mouth daily. Take 2 tablets one time a day.   cholecalciferol 1000 units tablet Commonly known as:  VITAMIN D Take 1,000 Units by mouth daily. Reported on 11/05/2015   Dulaglutide 0.75 MG/0.5ML Sopn Commonly known as:  TRULICITY INJECT 5.46 MG INTO THE SKIN ONCE A WEEK   glucose blood test strip Commonly known as:  ACCU-CHEK AVIVA PLUS Use as instructed   glucose blood test strip Commonly known as:  ONE TOUCH ULTRA TEST USE TO TEST BLOOD SUGAR THREE TIMES DAILY AS DIRECTED   insulin lispro 100 UNIT/ML KiwkPen Commonly known as:  HUMALOG KWIKPEN ADMINISTER 35 UNITS UNDER THE SKIN THREE TIMES DAILY BEFORE MEALS   insulin lispro 100 UNIT/ML injection Commonly known as:  HUMALOG Inject 0.9 mLs (90 Units total) into the skin daily.   Insulin Pen Needle 32G X 4 MM Misc Use 5 pens per day to inject insulin   B-D UF III MINI PEN NEEDLES 31G X 5 MM Misc Generic drug:  Insulin Pen Needle USE  FIVE PER DAY AS DIRECTED   INSULIN SYRINGE .5CC/30GX5/16" 30G X 5/16" 0.5 ML Misc Use 3 times a day for insulin   losartan 50 MG tablet Commonly known as:  COZAAR TAKE 1 TABLET(50 MG) BY MOUTH DAILY  meloxicam 7.5 MG tablet Commonly known as:  MOBIC Take 1-2 tablets (7.5-15 mg total) by mouth daily.   OMNIPOD 5 PACK Misc 1 each by Does not apply route daily. Apply 1 OmniPod to body one time every 3 days.   traMADol 50 MG tablet Commonly known as:  ULTRAM Take 1 tablet (50 mg total) by mouth every 6 (six) hours as needed.       Allergies: No Known Allergies  Past Medical History:  Diagnosis Date  . Blood transfusion without reported diagnosis   . Diabetes mellitus without complication ([place])   . History of ETOH abuse   . Hypertension     Past Surgical History:  Procedure Laterality Date  . BACK SURGERY N/A 2010    Family History  Problem Relation Age of Onset  . Diabetes Unknown   . Hypertension Unknown   . Arthritis Father   . Diabetes Father     Social History:  reports that he has quit smoking. He quit smokeless tobacco use about 24 years ago. He reports that he does not drink alcohol or use drugs.    Review of Systems         Lipids:  He has been on Lipitor 40 mg for hypercholesterolemia since about 2011, lipid levels shows good control Also on low-dose fenofibrate Lipids followed by PCP        Lab Results  Component Value Date   CHOL 86 05/26/2018   HDL 38.00 (L) 05/26/2018   LDLCALC 20 05/26/2018   LDLDIRECT 40.0 02/13/2016   TRIG 138.0 05/26/2018   CHOLHDL 2 05/26/2018       The blood pressure has been treated with losartan 50 mg, prescribed by PCP  Last foot exam was in 05/2017 done by PCP       Physical Examination:  BP 114/64 (BP Location: Left Arm, Patient Position: Sitting, Cuff Size: Normal)   Pulse 65   Temp 98.6 F (37 C) (Oral)   Ht '5\' 4"'  (1.626 m)   Wt 205 lb (93 kg)   SpO2 97%   BMI 35.19 kg/m       ASSESSMENT:  Diabetes type 2, uncontrolled with obesity and BMI of 36  See history of present illness for  description of current diabetes management, blood sugar patterns and problems identified.[name]  His A1c is still relatively high at 7.9  Since his fasting blood sugars are fairly good he must be having postprandial hyperglycemia which he is not monitoring enough Also not entering blood sugars at the time of his lunch and dinner boluses consistently to enable correction Not clear if he has benefited from starting Invokamet although not clear if he is taking this regularly He is continuing Trulicity with his insulin and reports good compliance  He has also not been adjusting his boluses based on meal size and did not know how to do this  HYPERTENSION: Appears better controlled now    PLAN:      Increase basal rate at 6 AM up to 1.65  Again showed him how to do the basal rate changes but not sure if he can do this himself  Continue regular walking  He was shown how to increase or decrease his bolus at the time of meals  He must enter his bolus right before eating  Also needs to check his blood sugar right at mealtimes consistently  No change in basal rate overnight  He will increase his BOLUS doses by 2 to 4 units if  eating a larger meal as shown today  Continue Trulicity weekly  Instructions were reviewed through interpreter today  Counseling time on subjects discussed in assessment and plan sections is over 50% of today's 25 minute visit    There are no Patient Instructions on file for this visit.   Elayne Snare 06/01/2018, 3:35 PM   Note: This office note was prepared with Dragon voice recognition system technology. Any transcriptional errors that result from this process are unintentional.

## 2018-06-01 ENCOUNTER — Encounter: Payer: Self-pay | Admitting: Endocrinology

## 2018-06-01 ENCOUNTER — Ambulatory Visit: Payer: Medicare Other | Admitting: Endocrinology

## 2018-06-01 VITALS — BP 114/64 | HR 65 | Temp 98.6°F | Ht 64.0 in | Wt 205.0 lb

## 2018-06-01 DIAGNOSIS — I10 Essential (primary) hypertension: Secondary | ICD-10-CM | POA: Diagnosis not present

## 2018-06-01 DIAGNOSIS — E1165 Type 2 diabetes mellitus with hyperglycemia: Secondary | ICD-10-CM | POA: Diagnosis not present

## 2018-06-01 DIAGNOSIS — Z794 Long term (current) use of insulin: Secondary | ICD-10-CM

## 2018-06-21 ENCOUNTER — Ambulatory Visit (INDEPENDENT_AMBULATORY_CARE_PROVIDER_SITE_OTHER): Payer: Medicare Other | Admitting: Medical

## 2018-06-21 ENCOUNTER — Encounter: Payer: Self-pay | Admitting: Medical

## 2018-06-21 VITALS — BP 130/72 | HR 61 | Temp 98.2°F | Resp 16 | Ht 64.0 in | Wt 206.6 lb

## 2018-06-21 DIAGNOSIS — M79674 Pain in right toe(s): Secondary | ICD-10-CM | POA: Diagnosis not present

## 2018-06-21 NOTE — Progress Notes (Signed)
Subjective:    Patient ID: Samuel [name], male    DOB: 1950-11-10, 67 y.o.   MRN: [id number]  HPI  Pt in with some recent rt foot pain. Redness to 5th toe and up side of foot. Pt describes callous on rt foot for years but his toe and foot never got red.  Pt has symptoms for 2 days before he went to the ED. Pt has diabetes but no breakdown to foot. The red area decreased a lot. Now only slight pinkish red to toe.  Pt xray showed in ED. No acute osseous abnormality. Nonspecific calcifications in the lateral small toe. Differential includes old trauma, hydroxyapatite/calcific periarthritis or gout (however no erosions present).  No labs were done.  Pt was given augmentin and indocin. Covered for both infection and gout. He did improve. But only took one tab augmentin daily instead of twice daily.   Review of Systems  Constitutional: Negative for chills, fatigue and fever.  Respiratory: Negative for cough, chest tightness, shortness of breath and wheezing.   Cardiovascular: Negative for chest pain and palpitations.  Gastrointestinal: Negative for abdominal pain.  Musculoskeletal: Negative for back pain.       Rt foot pain. See hpi.  Skin:       See hpi.  Hematological: Negative for adenopathy. Does not bruise/bleed easily.  Psychiatric/Behavioral: Negative for behavioral problems, confusion and sleep disturbance. The patient is not nervous/anxious.     Past Medical History:  Diagnosis Date  . Blood transfusion without reported diagnosis   . Diabetes mellitus without complication ([place])   . History of ETOH abuse   . Hypertension      Social History   Socioeconomic History  . Marital status: Married    Spouse name: Not on file  . Number of children: Not on file  . Years of education: Not on file  . Highest education level: Not on file  Occupational History  . Not on file  Social Needs  . Financial resource strain: Not on file  . Food insecurity:    Worry: Not on file   Inability: Not on file  . Transportation needs:    Medical: Not on file    Non-medical: Not on file  Tobacco Use  . Smoking status: Former Research scientist (life sciences)  . Smokeless tobacco: Former Systems developer    Quit date: 11/08/1993  Substance and Sexual Activity  . Alcohol use: No    Alcohol/week: 0.0 standard drinks  . Drug use: No  . Sexual activity: Not on file  Lifestyle  . Physical activity:    Days per week: Not on file    Minutes per session: Not on file  . Stress: Not on file  Relationships  . Social connections:    Talks on phone: Not on file    Gets together: Not on file    Attends religious service: Not on file    Active member of club or organization: Not on file    Attends meetings of clubs or organizations: Not on file    Relationship status: Not on file  . Intimate partner violence:    Fear of current or ex partner: Not on file    Emotionally abused: Not on file    Physically abused: Not on file    Forced sexual activity: Not on file  Other Topics Concern  . Not on file  Social History Narrative  . Not on file    Past Surgical History:  Procedure Laterality Date  . BACK SURGERY N/A  2010    Family History  Problem Relation Age of Onset  . Diabetes Unknown   . Hypertension Unknown   . Arthritis Father   . Diabetes Father     No Known Allergies  Current Outpatient Medications on File Prior to Visit  Medication Sig Dispense Refill  . ACCU-CHEK SOFTCLIX LANCETS lancets Use to check blood sugar 4 times per day. 400 each 2  . aspirin 325 MG tablet Take 81 mg by mouth daily.     [name] atorvastatin (LIPITOR) 40 MG tablet TAKE 1 TABLET BY MOUTH DAILY AND 1/2 TABLET BY MOUTH EVERY NIGHT AT BEDTIME 45 tablet 0  . atorvastatin (LIPITOR) 40 MG tablet TAKE 1 TABLET BY MOUTH DAILY AND 1/2 TABLET BY MOUTH EVERY NIGHT AT BEDTIME 45 tablet 0  . B-D UF III MINI PEN NEEDLES 31G X 5 MM MISC USE FIVE PER DAY AS DIRECTED 200 each 0  . Blood Glucose Monitoring Suppl (ACCU-CHEK AVIVA PLUS) w/Device  KIT USE TO CHECK BLOOD SUGAR DAILY 1 kit 1  . Canagliflozin-metFORMIN HCl ER (INVOKAMET XR) 50-1000 MG TB24 Take 50-1,000 mg by mouth daily. Take 2 tablets one time a day. 60 tablet 3  . cholecalciferol (VITAMIN D) 1000 UNITS tablet Take 1,000 Units by mouth daily. Reported on 11/05/2015    . Dulaglutide (TRULICITY) 4.49 EE/1.0OF SOPN INJECT 0.75 MG INTO THE SKIN ONCE A WEEK 4 pen 3  . glucose blood (ACCU-CHEK AVIVA PLUS) test strip Use as instructed 100 each 12  . glucose blood (ONE TOUCH ULTRA TEST) test strip USE TO TEST BLOOD SUGAR THREE TIMES DAILY AS DIRECTED 200 each 2  . Insulin Disposable Pump (OMNIPOD 5 PACK) MISC 1 each by Does not apply route daily. Apply 1 OmniPod to body one time every 3 days. 5 each 3  . insulin lispro (HUMALOG KWIKPEN) 100 UNIT/ML KiwkPen ADMINISTER 35 UNITS UNDER THE SKIN THREE TIMES DAILY BEFORE MEALS 30 mL 3  . insulin lispro (HUMALOG) 100 UNIT/ML injection Inject 0.9 mLs (90 Units total) into the skin daily. 40 mL 5  . Insulin Pen Needle 32G X 4 MM MISC Use 5 pens per day to inject insulin 200 each 3  . Insulin Syringe-Needle U-100 (INSULIN SYRINGE .5CC/30GX5/16") 30G X 5/16" 0.5 ML MISC Use 3 times a day for insulin 100 each 1  . losartan (COZAAR) 50 MG tablet TAKE 1 TABLET(50 MG) BY MOUTH DAILY 90 tablet 0  . meloxicam (MOBIC) 7.5 MG tablet Take 1-2 tablets (7.5-15 mg total) by mouth daily. 90 tablet 0  . traMADol (ULTRAM) 50 MG tablet Take 1 tablet (50 mg total) by mouth every 6 (six) hours as needed. 20 tablet 0   No current facility-administered medications on file prior to visit.     BP 130/72   Pulse 61   Temp 98.2 F (36.8 C) (Oral)   Resp 16   Ht 5' 4" (1.626 m)   Wt 206 lb 9.6 oz (93.7 kg)   SpO2 99%   BMI 35.46 kg/m       Objective:   Physical Exam  General Mental Status- Alert. General Appearance- Not in acute distress.   Skin General: Color- Normal Color. Moisture- Normal Moisture.  Neck Carotid Arteries- Normal color.  Moisture- Normal Moisture. No carotid bruits. No JVD.  Chest and Lung Exam Auscultation: Breath Sounds:-Normal.  Cardiovascular Auscultation:Rythm- Regular. Murmurs & Other Heart Sounds:Auscultation of the heart reveals- No Murmurs.  Abdomen Inspection:-Inspeection Normal. Palpation/Percussion:Note:No mass. Palpation and Percussion of the abdomen reveal-  Non Tender, Non Distended + BS, no rebound or guarding.   Neurologic Cranial Nerve exam:- CN III-XII intact(No nystagmus), symmetric smile. Strength:- 5/5 equal and symmetric strength both upper and lower extremities.  Rt feet- normal now except rt 5th toe mild swollen. Faint pinkish red. No breakdown. No ulcer. Small callous present.    Assessment & Plan:  Your recent right foot pain and toe pain could have been skin infection versus gout.  They gave you treatment for both conditions and you have improved a lot except for the redness to your fifth toe.  Please take the antibiotic Augmentin twice daily.  Continue with the indomethacin as well.  You have a future lab order to get CBC and uric acid tomorrow morning at 8:45.  We will see if your uric acid is elevated.  In the event you have any breakdown of skin on your foot or any expanding redness up your foot then recommend to be seen in the emergency department.  I do not expect this to occur.  We will let you know results of labs when they are in.  If uric acid is elevated then would likely give you preventative medication for gout once you get over acute phase.  Follow-up date to be determined after lab review.  Mackie Pai, PA-C

## 2018-06-21 NOTE — Patient Instructions (Signed)
Your recent right foot pain and toe pain could have been skin infection versus gout.  They gave you treatment for both conditions and you have improved a lot except for the redness to your fifth toe.  Please take the antibiotic Augmentin twice daily.  Continue with the indomethacin as well.  You have a future lab order to get CBC and uric acid tomorrow morning at 8:45.  We will see if your uric acid is elevated.  In the event you have any breakdown of skin on your foot or any expanding redness up your foot then recommend to be seen in the emergency department.  I do not expect this to occur.  We will let you know results of labs when they are in.  If uric acid is elevated then would likely give you preventative medication for gout once you get over acute phase.  Follow-up date to be determined after lab review.

## 2018-06-22 ENCOUNTER — Other Ambulatory Visit (INDEPENDENT_AMBULATORY_CARE_PROVIDER_SITE_OTHER): Payer: Medicare Other

## 2018-06-22 ENCOUNTER — Other Ambulatory Visit: Payer: Self-pay | Admitting: Endocrinology

## 2018-06-22 DIAGNOSIS — M79674 Pain in right toe(s): Secondary | ICD-10-CM | POA: Diagnosis not present

## 2018-06-22 LAB — CBC WITH DIFFERENTIAL/PLATELET
BASOS ABS: 0 10*3/uL (ref 0.0–0.1)
Basophils Relative: 0.6 % (ref 0.0–3.0)
EOS ABS: 0.2 10*3/uL (ref 0.0–0.7)
Eosinophils Relative: 3.3 % (ref 0.0–5.0)
HCT: 45.1 % (ref 39.0–52.0)
Hemoglobin: 15.3 g/dL (ref 13.0–17.0)
LYMPHS ABS: 1.9 10*3/uL (ref 0.7–4.0)
Lymphocytes Relative: 34.6 % (ref 12.0–46.0)
MCHC: 33.9 g/dL (ref 30.0–36.0)
MCV: 86.9 fl (ref 78.0–100.0)
Monocytes Absolute: 0.3 10*3/uL (ref 0.1–1.0)
Monocytes Relative: 5.9 % (ref 3.0–12.0)
NEUTROS ABS: 3 10*3/uL (ref 1.4–7.7)
NEUTROS PCT: 55.6 % (ref 43.0–77.0)
PLATELETS: 202 10*3/uL (ref 150.0–400.0)
RBC: 5.19 Mil/uL (ref 4.22–5.81)
RDW: 14.1 % (ref 11.5–15.5)
WBC: 5.4 10*3/uL (ref 4.0–10.5)

## 2018-06-22 LAB — URIC ACID: URIC ACID, SERUM: 3 mg/dL — AB (ref 4.0–7.8)

## 2018-07-04 ENCOUNTER — Other Ambulatory Visit: Payer: Self-pay | Admitting: Neurological Surgery

## 2018-07-06 ENCOUNTER — Ambulatory Visit (INDEPENDENT_AMBULATORY_CARE_PROVIDER_SITE_OTHER): Payer: Medicare Other | Admitting: Medical

## 2018-07-06 ENCOUNTER — Encounter: Payer: Self-pay | Admitting: Medical

## 2018-07-06 VITALS — BP 123/68 | HR 65 | Temp 98.5°F | Resp 16 | Ht 64.0 in | Wt 204.0 lb

## 2018-07-06 DIAGNOSIS — L089 Local infection of the skin and subcutaneous tissue, unspecified: Secondary | ICD-10-CM

## 2018-07-06 DIAGNOSIS — M2550 Pain in unspecified joint: Secondary | ICD-10-CM

## 2018-07-06 MED ORDER — DOXYCYCLINE HYCLATE 100 MG PO TABS: 100.0000 mg | ORAL_TABLET | Freq: Two times a day (BID) | ORAL | 0 refills | Status: DC

## 2018-07-06 NOTE — Patient Instructions (Addendum)
Your right small/pinkey toe looks moderately improved but not completely improved.  No longer having bright red severely swollen appearance.  Coloration looks better and skin does not feel tight.  Your infection fighting cells were normal and your uric acid was not elevated.  Your improvement with Augmentin makes me consider skin infection.  We will provide another 5 days of antibiotic but will give doxycycline this round.  Rx advisement given.  Some features of the right toe swelling indicates possible sausage toe type appearance.  Since you do report some history of joint pains and foot pain, I did add inflammatory lab studies to be done today.  Give us an update in 5 to 6 days on how your toe appears.  If it is still swollen then I will get opinion from sports medicine and have him [facility]

## 2018-07-06 NOTE — Progress Notes (Signed)
Subjective:    Patient ID: Samuel [name], male    DOB: 23-Dec-1950, 67 y.o.   MRN: [id number]  HPI  Pt in with rt 5th toe swelling. I was considering infection vs gout. I gave augmentin and followed labs. Pt uric acid was negative and wbc was not elevated. He states swelling of toe feels better but not completely. He states when toe swelling decreased the skin seems to dry up and start to peel at base  Pt does note that he has occasional knee pain.  In pastPt never had swollen toe before this past event.  Before he saw me on last visit his ED Xray did show  Nonspecific calcification in lateral small toe.Differential includes old trauma, hydroxyapatite/calcific periarthritis or gout (however no erosions present).  Review of Systems  Constitutional: Negative for chills, fatigue and fever.  Respiratory: Negative for cough, chest tightness and wheezing.   Musculoskeletal:       See hpi.  Skin:       See hpi.    Past Medical History:  Diagnosis Date  . Blood transfusion without reported diagnosis   . Diabetes mellitus without complication ([place])   . History of ETOH abuse   . Hypertension      Social History   Socioeconomic History  . Marital status: Married    Spouse name: Not on file  . Number of children: Not on file  . Years of education: Not on file  . Highest education level: Not on file  Occupational History  . Not on file  Social Needs  . Financial resource strain: Not on file  . Food insecurity:    Worry: Not on file    Inability: Not on file  . Transportation needs:    Medical: Not on file    Non-medical: Not on file  Tobacco Use  . Smoking status: Former Research scientist (life sciences)  . Smokeless tobacco: Former Systems developer    Quit date: 11/08/1993  Substance and Sexual Activity  . Alcohol use: No    Alcohol/week: 0.0 standard drinks  . Drug use: No  . Sexual activity: Not on file  Lifestyle  . Physical activity:    Days per week: Not on file    Minutes per session: Not on file  .  Stress: Not on file  Relationships  . Social connections:    Talks on phone: Not on file    Gets together: Not on file    Attends religious service: Not on file    Active member of club or organization: Not on file    Attends meetings of clubs or organizations: Not on file    Relationship status: Not on file  . Intimate partner violence:    Fear of current or ex partner: Not on file    Emotionally abused: Not on file    Physically abused: Not on file    Forced sexual activity: Not on file  Other Topics Concern  . Not on file  Social History Narrative  . Not on file    Past Surgical History:  Procedure Laterality Date  . BACK SURGERY N/A 2010    Family History  Problem Relation Age of Onset  . Diabetes Unknown   . Hypertension Unknown   . Arthritis Father   . Diabetes Father     No Known Allergies  Current Outpatient Medications on File Prior to Visit  Medication Sig Dispense Refill  . ACCU-CHEK SOFTCLIX LANCETS lancets Use to check blood sugar 4 times per day.  400 each 2  . aspirin 325 MG tablet Take 81 mg by mouth daily.     [name] atorvastatin (LIPITOR) 40 MG tablet TAKE 1 TABLET BY MOUTH DAILY AND 1/2 TABLET BY MOUTH EVERY NIGHT AT BEDTIME 45 tablet 0  . atorvastatin (LIPITOR) 40 MG tablet TAKE 1 TABLET BY MOUTH DAILY AND 1/2 TABLET BY MOUTH EVERY NIGHT AT BEDTIME 45 tablet 0  . B-D UF III MINI PEN NEEDLES 31G X 5 MM MISC USE FIVE PER DAY AS DIRECTED 200 each 0  . Blood Glucose Monitoring Suppl (ACCU-CHEK AVIVA PLUS) w/Device KIT USE TO CHECK BLOOD SUGAR DAILY 1 kit 1  . cholecalciferol (VITAMIN D) 1000 UNITS tablet Take 1,000 Units by mouth daily. Reported on 11/05/2015    . Dulaglutide (TRULICITY) 8.56 DJ/4.9FW SOPN INJECT 0.75 MG INTO THE SKIN ONCE A WEEK 4 pen 3  . glucose blood (ACCU-CHEK AVIVA PLUS) test strip Use as instructed 100 each 12  . glucose blood (ONE TOUCH ULTRA TEST) test strip USE TO TEST BLOOD SUGAR THREE TIMES DAILY AS DIRECTED 200 each 2  . Insulin  Disposable Pump (OMNIPOD 5 PACK) MISC 1 each by Does not apply route daily. Apply 1 OmniPod to body one time every 3 days. 5 each 3  . insulin lispro (HUMALOG KWIKPEN) 100 UNIT/ML KiwkPen ADMINISTER 35 UNITS UNDER THE SKIN THREE TIMES DAILY BEFORE MEALS 30 mL 3  . insulin lispro (HUMALOG) 100 UNIT/ML injection Inject 0.9 mLs (90 Units total) into the skin daily. 40 mL 5  . Insulin Pen Needle 32G X 4 MM MISC Use 5 pens per day to inject insulin 200 each 3  . Insulin Syringe-Needle U-100 (INSULIN SYRINGE .5CC/30GX5/16") 30G X 5/16" 0.5 ML MISC Use 3 times a day for insulin 100 each 1  . INVOKAMET XR 50-1000 MG TB24 TAKE 2 TABLETS BY MOUTH EVERY DAY 60 tablet 0  . losartan (COZAAR) 50 MG tablet TAKE 1 TABLET(50 MG) BY MOUTH DAILY 90 tablet 0  . meloxicam (MOBIC) 7.5 MG tablet Take 1-2 tablets (7.5-15 mg total) by mouth daily. 90 tablet 0  . traMADol (ULTRAM) 50 MG tablet Take 1 tablet (50 mg total) by mouth every 6 (six) hours as needed. 20 tablet 0   No current facility-administered medications on file prior to visit.     BP 123/68   Pulse 65   Temp 98.5 F (36.9 C) (Oral)   Resp 16   Ht _0  (1.626 m)   Wt 204 lb (92.5 kg)   SpO2 96%   BMI 35.02 kg/m       Objective:   Physical Exam  General- No acute distress. Pleasant patient. Neck- Full range of motion, no jvd Lungs- Clear, even and unlabored. Heart- regular rate and rhythm. Neurologic- CNII- XII grossly intact.   Knees- good rom. No crepitus.  Rt foot- pt has does have callous later aspect of small toe. Small toe is still swollen but much less. Some dry skin at base of the toe. Skin aft base of toe dried and mild peeled.    Assessment & Plan:  Your right small/pinkey toe looks moderately improved but not completely improved.  No longer having bright red severely swollen appearance.  Coloration looks better and skin does not feel tight.  Your infection fighting cells were normal and your uric acid was not elevated.  Your  improvement with Augmentin makes me consider skin infection.  We will provide another 5 days of antibiotic but will give doxycycline this  round.  Rx advisement given.  Some features of the right toe swelling indicates possible sausage toe type appearance.  Since you do report some history of joint pains and foot pain, I did add inflammatory lab studies to be done today.  Give [place] an update in 5 to 6 days on how your toe appears.  If it is still swollen then I will get opinion from sports medicine and have him review your recent [facility].  Mackie Pai, PA-C

## 2018-07-07 LAB — SEDIMENTATION RATE: SED RATE: 12 mm/h (ref 0–20)

## 2018-07-07 LAB — C-REACTIVE PROTEIN: CRP: 0.1 mg/dL — ABNORMAL LOW (ref 0.5–20.0)

## 2018-07-10 LAB — RHEUMATOID FACTOR

## 2018-07-10 LAB — ANA: ANA: NEGATIVE

## 2018-07-18 ENCOUNTER — Encounter (HOSPITAL_COMMUNITY): Payer: Self-pay

## 2018-07-18 NOTE — Pre-Procedure Instructions (Addendum)
Instrucciones Para Antes de la Ciruga   Su ciruga est programada para-(your procedure is scheduled on)  Sept. 16   [facility] Admitting - 5:30    Por favor llame al [phone] si tiene algn problema en la maana de la ciruga. (please call if you have any problems the morning of surgery.)                  Recuerde: (Remember)   No coma alimentos ni tome lquidos, incluyendo agua, despus de la medianoche del  (Do not eat food or drink liquids including water after midnight.   Tome estas medicinas en la maana de la ciruga con un SORBITO de agua (take these meds the morning of surgery with a SIP of water) :none   Puede cepillarse los dientes en la maana de la ciruga. (you may brush your teeth the morning of surgery)   No use joyas, maquillaje de ojos, lpiz labial, crema para el cuerpo o esmalte de uas oscuro. (Do not wear jewelry, eye makeup, lipstick, body lotion, or dark fingernail polish)   No puede usar desodorante. (you may wear deodorant)   Si va a ser ingresado despues de la ciruga, deje la maleta en el carro hasta que se le haya asignado una habitacin. (If you are to be admitted after surgery, leave suitcase in car until your room has been assigned.)   A los pacientes que se les d de alta el mismo da no se les permitir manejar a casa.  (Patients discharged on the day of surgery will not be allowed to drive home)   Use ropa suelta y cmoda de regreso a casa. (wear loose comfortable clothes for ride home)    Firma del paciente (patient signature) ______________________________________   Manuela Schwartz  07/18/2018      [id number] - [place], [place] - [address] AT [facility] OF [facility] & [address] [facility] [place] [address] Phone: [phone] Fax: [phone]  [facility] - [place], [place] - [address] [facility] [address] [place] [address] [place] [address] Phone: [phone] Fax: [phone]    Your procedure is scheduled on September 16th.  Report to [facility] Admitting at 0530 A.M.  Call this number if you have problems the morning of surgery:  [phone]   Remember:  Do not eat or drink after midnight.      Take these medicines the morning of surgery with A SIP OF WATER: NONE  7 days prior to surgery STOP taking any Aspirin(unless otherwise instructed by your surgeon), Aleve, Naproxen, Ibuprofen, Motrin, Advil, Goody's, BC's, all herbal medications, fish oil, and all vitamins  Follow your surgeon's instructions on when to stop Asprin.  If no instructions were given by your surgeon then you will need to call the office to get those instructions.     WHAT DO I DO ABOUT MY DIABETES MEDICATION?   [name] Do not take oral diabetes medicines (pills) the morning of surgery.  . THE NIGHT BEFORE SURGERY, take ___________ units of ___________insulin.       . THE MORNING OF SURGERY, take _____________ units of __________insulin.  . The day of surgery, do not take other diabetes injectables, including Byetta (exenatide), Bydureon (exenatide ER), Victoza (liraglutide), or Trulicity (dulaglutide).  . If your CBG is greater than 220 mg/dL, you may take  of your sliding scale (correction) dose of insulin.   How to Manage Your Diabetes Before  and After Surgery  Why is it important to control my blood sugar before and after surgery? . Improving blood sugar levels before and after surgery helps healing and can limit problems. . A way of improving blood sugar control is eating a healthy diet by: o  Eating less sugar and carbohydrates o  Increasing activity/exercise o  Talking with your doctor about reaching your blood sugar goals . High blood sugars (greater than 180 mg/dL) can raise your risk of infections and slow your recovery, so you will need to focus on controlling your diabetes during the weeks  before surgery. . Make sure that the doctor who takes care of your diabetes knows about your planned surgery including the date and location.  How do I manage my blood sugar before surgery? . Check your blood sugar at least 4 times a day, starting 2 days before surgery, to make sure that the level is not too high or low. o Check your blood sugar the morning of your surgery when you wake up and every 2 hours until you get to the Short Stay unit. . If your blood sugar is less than 70 mg/dL, you will need to treat for low blood sugar: o Do not take insulin. o Treat a low blood sugar (less than 70 mg/dL) with  cup of clear juice (cranberry or apple), 4 glucose tablets, OR glucose gel. o Recheck blood sugar in 15 minutes after treatment (to make sure it is greater than 70 mg/dL). If your blood sugar is not greater than 70 mg/dL on recheck, call [phone] for further instructions. . Report your blood sugar to the short stay nurse when you get to Short Stay.  . If you are admitted to the hospital after surgery: o Your blood sugar will be checked by the staff and you will probably be given insulin after surgery (instead of oral diabetes medicines) to make sure you have good blood sugar levels. o The goal for blood sugar control after surgery is 80-180 mg/dL.    Patients with Insulin Pumps    . For patients with Insulin Pumps: o Contact your diabetes doctor for specific instructions before surgery. o Decrease basal insulin rates by 20% at midnight the night before surgery. o Note that if your surgery is planned to be longer than 2 hours, your insulin pump will be removed and intravenous (IV) insulin will be started and managed by the nurses and anesthesiologist. You will be able to restart your insulin pump once you are awake and able to manage it. o Make sure to bring insulin pump supplies to the hospital with you in case your site needs to be changed.                   Do not wear  jewelry.  Do not wear lotions, powders, or cologne, or deodorant.  Men may shave face and neck.  Do not bring valuables to the hospital.  [facility] is not responsible for any belongings or valuables.  Contacts, dentures or bridgework may not be worn into surgery.  Leave your suitcase in the car.  After surgery it may be brought to your room.  For patients admitted to the hospital, discharge time will be determined by your treatment team.  Patients discharged the day of surgery will not be allowed to drive home.   Special instructions:   - Preparing For Surgery  Before surgery, you can play an important role. Because skin is not sterile, your  skin needs to be as free of germs as possible. You can reduce the number of germs on your skin by washing with CHG (chlorahexidine gluconate) Soap before surgery.  CHG is an antiseptic cleaner which kills germs and bonds with the skin to continue killing germs even after washing.    Oral Hygiene is also important to reduce your risk of infection.  Remember - BRUSH YOUR TEETH THE MORNING OF SURGERY WITH YOUR REGULAR TOOTHPASTE  Please do not use if you have an allergy to CHG or antibacterial soaps. If your skin becomes reddened/irritated stop using the CHG.  Do not shave (including legs and underarms) for at least 48 hours prior to first CHG shower. It is OK to shave your face.  Please follow these instructions carefully.   1. Shower the NIGHT BEFORE SURGERY and the MORNING OF SURGERY with CHG.   2. If you chose to wash your hair, wash your hair first as usual with your normal shampoo.  3. After you shampoo, rinse your hair and body thoroughly to remove the shampoo.  4. Use CHG as you would any other liquid soap. You can apply CHG directly to the skin and wash gently with a scrungie or a clean washcloth.   5. Apply the CHG Soap to your body ONLY FROM THE NECK DOWN.  Do not use on open wounds or open sores. Avoid contact with your eyes,  ears, mouth and genitals (private parts). Wash Face and genitals (private parts)  with your normal soap.  6. Wash thoroughly, paying special attention to the area where your surgery will be performed.  7. Thoroughly rinse your body with warm water from the neck down.  8. DO NOT shower/wash with your normal soap after using and rinsing off the CHG Soap.  9. Pat yourself dry with a CLEAN TOWEL.  10. Wear CLEAN PAJAMAS to bed the night before surgery, wear comfortable clothes the morning of surgery  11. Place CLEAN SHEETS on your bed the night of your first shower and DO NOT SLEEP WITH PETS.    Day of Surgery:  Do not apply any deodorants/lotions.  Please wear clean clothes to the hospital/surgery center.   Remember to brush your teeth WITH YOUR REGULAR TOOTHPASTE.    Please read over the following fact sheets that you were given. Coughing and Deep Breathing, MRSA Information and Surgical Site Infection Prevention

## 2018-07-19 ENCOUNTER — Telehealth: Payer: Self-pay | Admitting: Nutrition

## 2018-07-19 ENCOUNTER — Encounter (HOSPITAL_COMMUNITY): Payer: Self-pay

## 2018-07-19 ENCOUNTER — Encounter (HOSPITAL_COMMUNITY)
Admission: RE | Admit: 2018-07-19 | Discharge: 2018-07-19 | Disposition: A | Discharge status: Home / Self Care | Payer: Medicare Other | Source: Ambulatory Visit | Attending: Neurological Surgery | Admitting: Neurological Surgery

## 2018-07-19 ENCOUNTER — Other Ambulatory Visit: Payer: Self-pay

## 2018-07-19 DIAGNOSIS — R9431 Abnormal electrocardiogram [ECG] [EKG]: Secondary | ICD-10-CM | POA: Insufficient documentation

## 2018-07-19 DIAGNOSIS — Z01818 Encounter for other preprocedural examination: Secondary | ICD-10-CM | POA: Diagnosis present

## 2018-07-19 DIAGNOSIS — E119 Type 2 diabetes mellitus without complications: Secondary | ICD-10-CM | POA: Insufficient documentation

## 2018-07-19 LAB — NO BLOOD PRODUCTS

## 2018-07-19 LAB — BASIC METABOLIC PANEL
Anion gap: 9 (ref 5–15)
BUN: 17 mg/dL (ref 8–23)
CALCIUM: 9.4 mg/dL (ref 8.9–10.3)
CHLORIDE: 105 mmol/L (ref 98–111)
CO2: 24 mmol/L (ref 22–32)
CREATININE: 0.81 mg/dL (ref 0.61–1.24)
GFR calc non Af Amer: >60 mL/min (ref >60)
Glucose, Bld: 106 mg/dL — ABNORMAL HIGH (ref 70–99)
Potassium: 3.9 mmol/L (ref 3.5–5.1)
SODIUM: 138 mmol/L (ref 135–145)

## 2018-07-19 LAB — CBC
HCT: 47.5 % (ref 39.0–52.0)
HEMOGLOBIN: 15.7 g/dL (ref 13.0–17.0)
MCH: 29.3 pg (ref 26.0–34.0)
MCHC: 33.1 g/dL (ref 30.0–36.0)
MCV: 88.6 fL (ref 78.0–100.0)
PLATELETS: 189 10*3/uL (ref 150–400)
RBC: 5.36 MIL/uL (ref 4.22–5.81)
RDW: 13.5 % (ref 11.5–15.5)
WBC: 7.1 10*3/uL (ref 4.0–10.5)

## 2018-07-19 LAB — GLUCOSE, CAPILLARY: GLUCOSE-CAPILLARY: 142 mg/dL — AB (ref 70–99)

## 2018-07-19 LAB — SURGICAL PCR SCREEN
MRSA, PCR: NEGATIVE
STAPHYLOCOCCUS AUREUS: NEGATIVE

## 2018-07-19 LAB — HEMOGLOBIN A1C
HEMOGLOBIN A1C: 7.9 % — AB (ref 4.8–5.6)
Mean Plasma Glucose: 180.03 mg/dL

## 2018-07-19 NOTE — Telephone Encounter (Signed)
Agree with the instructions

## 2018-07-19 NOTE — Progress Notes (Signed)
   07/19/18 1138  OBSTRUCTIVE SLEEP APNEA  Have you ever been diagnosed with sleep apnea through a sleep study? No  Do you snore loudly (loud enough to be heard through closed doors)?  0  Do you often feel tired, fatigued, or sleepy during the daytime (such as falling asleep during driving or talking to someone)? 0  Has anyone observed you stop breathing during your sleep? 0  Do you have, or are you being treated for high blood pressure? 1  BMI more than 35 kg/m2? 1  Age > 50 (1-yes) 1  Neck circumference greater than:Male 16 inches or larger, Male 17inches or larger? 1  Male Gender (Yes=1) 1  Obstructive Sleep Apnea Score 5  Score 5 or greater  Results sent to PCP

## 2018-07-19 NOTE — Progress Notes (Addendum)
PCP - Esperanza Richters  Cardiologist - denies  Endocrinologist - Reather Littler  DM - Type II Fasting Sugars - 120's  EKG - denies  Echo - denies  Stress Test - denies  Cath - denies  *Pt does not take blood products. Refusal for blood products has been signed and faxed to blood bank (07-19-18)  Pt denies shortness of breath, cough, fever.  Pt instructed to follow up with Dr. Lucianne Muss regarding insulin pump instructions for day of surgery. Pt instructed to follow up with Dr. Danielle Dess for instructions on when to stop ASA.   Followed up with Dr. Verlee Rossetti office today about patient's refusal for blood products. Left voicemail for [facility].  Diabetes Coordinator, Christena Deem, made aware of pt's date and surgery time. Pt on insulin pump. She recommended for pt to contact Dr. Lucianne Muss regarding insulin pump instructions for day of surgery.

## 2018-07-19 NOTE — Telephone Encounter (Signed)
Patient reports that he is having back surgery on Monday at 6AM.  He wants to know what to do with his pump. Per Dr. Remus Blake voice order, he was shown how to put his pump in a temp. Basal rate of 20%. He will add 1 hour to the total time the doctor tells him he will be in surgery.   Written steps were given to him for the above directions, and he did this on his PDM correctly.  He had no final questions.

## 2018-07-20 NOTE — Progress Notes (Signed)
Anesthesia Chart Review:  Case:  [id number] Date/Time:  07/24/18 0715   Procedure:  Lumbar 2-3 Posterior lumbar interbody fusion (N/A Back) - Lumbar 2-3 Posterior lumbar interbody fusion   Anesthesia type:  General   Pre-op diagnosis:  Spondylolisthesis, Lumbar region   Location:  [facility] OR ROOM 19 / [facility] OR   Surgeon:  Barnett Abu, MD      DISCUSSION: 67 yo male former smoker for above procedure. Pertinent hx includes HTN, IDDM with insulin pump, History ETOH abuse, Refusal of blood products.  Per note 07/19/2018 by Cristy Folks, RN pt has been instructed on how to set his insulin pump during surgery.   Pt has refusal of blood products on file. H/H 15.7/47.5. Per PAT nurse note a VM was left for Jessica Hanks at Dr. Verlee Rossetti office to make sure he is aware. Per Shanda Bumps at Dr. Verlee Rossetti office he will discuss cell saver on morning of surgery.  Anticipate he can proceed with surgery as planned barring acute status change.  VS: BP 137/66   Pulse 63   Temp 36.7 C (Oral)   Resp 16   Wt 92.8 kg   SpO2 96%   BMI 35.11 kg/m   PROVIDERS: Saguier, Ramon Dredge, PA-C is PCP  Reather Littler, MD is Endocrinologist  LABS: Labs reviewed: Acceptable for surgery. (all labs ordered are listed, but only abnormal results are displayed)  Labs Reviewed  GLUCOSE, CAPILLARY - Abnormal; Notable for the following components:      Result Value   Glucose-Capillary 142 (*)    All other components within normal limits  BASIC METABOLIC PANEL - Abnormal; Notable for the following components:   Glucose, Bld 106 (*)    All other components within normal limits  HEMOGLOBIN A1C - Abnormal; Notable for the following components:   Hgb A1c MFr Bld 7.9 (*)    All other components within normal limits  SURGICAL PCR SCREEN  CBC     IMAGES: CHEST - 2 VIEW 04/25/2018  COMPARISON:  None.  FINDINGS: Normal heart size and mediastinal contours. No acute infiltrate or edema. No effusion or pneumothorax. No acute osseous  findings. Partially visualized lumbar fusion hardware.  IMPRESSION: Negative for pneumonia.   EKG: 07/19/2018:Normal sinus rhythm. Left anterior fasicular block. Poor R wave progression Non-specific intra-ventricular conduction delay. No previous tracing   CV: N/A  Past Medical History:  Diagnosis Date  . Blood transfusion without reported diagnosis   . Diabetes mellitus without complication (HCC)    type II  . History of ETOH abuse   . Hypertension     Past Surgical History:  Procedure Laterality Date  . BACK SURGERY N/A 2010    MEDICATIONS: . aspirin EC 81 MG tablet  . atorvastatin (LIPITOR) 40 MG tablet  . B-D UF III MINI PEN NEEDLES 31G X 5 MM MISC  . doxycycline (VIBRA-TABS) 100 MG tablet  . Dulaglutide (TRULICITY) 0.75 MG/0.5ML SOPN  . glucose blood (ONE TOUCH ULTRA TEST) test strip  . Insulin Disposable Pump (OMNIPOD 5 PACK) MISC  . insulin lispro (HUMALOG) 100 UNIT/ML injection  . Insulin Pen Needle 32G X 4 MM MISC  . Insulin Syringe-Needle U-100 (INSULIN SYRINGE .5CC/30GX5/16") 30G X 5/16" 0.5 ML MISC  . INVOKAMET XR 50-1000 MG TB24  . losartan (COZAAR) 50 MG tablet  . meloxicam (MOBIC) 7.5 MG tablet  . NON FORMULARY  . OVER THE COUNTER MEDICATION  . traMADol (ULTRAM) 50 MG tablet   No current facility-administered medications for this encounter.  Samuel CoveJames Ha Shannahan, PA-C [facility]/Anesthesiology Phone [phone] 07/20/2018 12:10 PM

## 2018-07-23 NOTE — Anesthesia Preprocedure Evaluation (Addendum)
Anesthesia Evaluation  Patient identified by MRN, date of birth, ID band Patient awake    Reviewed: Allergy & Precautions, NPO status , Patient's Chart, lab work & pertinent test results  Airway Mallampati: II  TM Distance: >3 FB Neck ROM: Full    Dental  (+) Teeth Intact, Dental Advisory Given,    Pulmonary asthma , former smoker,    breath sounds clear to auscultation       Cardiovascular hypertension, Pt. on medications  Rhythm:Regular Rate:Normal     Neuro/Psych negative neurological ROS  negative psych ROS   GI/Hepatic negative GI ROS, (+)     substance abuse (h/o EtOH abuse)  ,   Endo/Other  diabetes (insulin pump, pump in currently removed), Insulin Dependent  Renal/GU negative Renal ROS     Musculoskeletal  (+) Arthritis , Osteoarthritis,    Abdominal   Peds  Hematology  (+) REFUSES BLOOD PRODUCTS,   Anesthesia Other Findings   Reproductive/Obstetrics                          Anesthesia Physical Anesthesia Plan  ASA: III  Anesthesia Plan: General   Post-op Pain Management:    Induction: Intravenous  PONV Risk Score and Plan: 2 and Dexamethasone and Ondansetron  Airway Management Planned: Oral ETT  Additional Equipment:   Intra-op Plan:   Post-operative Plan: Extubation in OR  Informed Consent: I have reviewed the patients History and Physical, chart, labs and discussed the procedure including the risks, benefits and alternatives for the proposed anesthesia with the patient or authorized representative who has indicated his/her understanding and acceptance.   Dental advisory given  Plan Discussed with: CRNA, Anesthesiologist and Surgeon  Anesthesia Plan Comments:        Anesthesia Quick Evaluation

## 2018-07-24 ENCOUNTER — Inpatient Hospital Stay (HOSPITAL_COMMUNITY): Payer: Medicare Other | Admitting: Anesthesiology

## 2018-07-24 ENCOUNTER — Inpatient Hospital Stay (HOSPITAL_COMMUNITY)
Admission: RE | Disposition: A | Discharge status: Home / Self Care | Payer: Self-pay | Source: Home / Self Care | Attending: Neurological Surgery

## 2018-07-24 ENCOUNTER — Inpatient Hospital Stay (HOSPITAL_COMMUNITY)
Admission: RE | Admit: 2018-07-24 | Discharge: 2018-07-25 | DRG: 455 | Disposition: A | Discharge status: Home / Self Care | Payer: Medicare Other | Attending: Neurological Surgery | Admitting: Neurological Surgery

## 2018-07-24 ENCOUNTER — Inpatient Hospital Stay (HOSPITAL_COMMUNITY): Payer: Medicare Other | Admitting: Physician Assistant

## 2018-07-24 ENCOUNTER — Other Ambulatory Visit: Payer: Self-pay

## 2018-07-24 ENCOUNTER — Inpatient Hospital Stay (HOSPITAL_COMMUNITY): Payer: Medicare Other

## 2018-07-24 ENCOUNTER — Encounter (HOSPITAL_COMMUNITY): Payer: Self-pay | Admitting: General Practice

## 2018-07-24 DIAGNOSIS — M4726 Other spondylosis with radiculopathy, lumbar region: Secondary | ICD-10-CM | POA: Diagnosis present

## 2018-07-24 DIAGNOSIS — I10 Essential (primary) hypertension: Secondary | ICD-10-CM | POA: Diagnosis present

## 2018-07-24 DIAGNOSIS — Z794 Long term (current) use of insulin: Secondary | ICD-10-CM

## 2018-07-24 DIAGNOSIS — Z981 Arthrodesis status: Secondary | ICD-10-CM

## 2018-07-24 DIAGNOSIS — Z419 Encounter for procedure for purposes other than remedying health state, unspecified: Secondary | ICD-10-CM

## 2018-07-24 DIAGNOSIS — Z87891 Personal history of nicotine dependence: Secondary | ICD-10-CM

## 2018-07-24 DIAGNOSIS — M48062 Spinal stenosis, lumbar region with neurogenic claudication: Principal | ICD-10-CM | POA: Diagnosis present

## 2018-07-24 DIAGNOSIS — E119 Type 2 diabetes mellitus without complications: Secondary | ICD-10-CM | POA: Diagnosis present

## 2018-07-24 DIAGNOSIS — Z79899 Other long term (current) drug therapy: Secondary | ICD-10-CM

## 2018-07-24 DIAGNOSIS — M4316 Spondylolisthesis, lumbar region: Secondary | ICD-10-CM | POA: Diagnosis present

## 2018-07-24 DIAGNOSIS — M48061 Spinal stenosis, lumbar region without neurogenic claudication: Secondary | ICD-10-CM | POA: Diagnosis present

## 2018-07-24 LAB — GLUCOSE, CAPILLARY
GLUCOSE-CAPILLARY: 145 mg/dL — AB (ref 70–99)
GLUCOSE-CAPILLARY: 132 mg/dL — AB (ref 70–99)
GLUCOSE-CAPILLARY: 168 mg/dL — AB (ref 70–99)
GLUCOSE-CAPILLARY: 215 mg/dL — AB (ref 70–99)
GLUCOSE-CAPILLARY: 144 mg/dL — AB (ref 70–99)
Glucose-Capillary: 172 mg/dL — ABNORMAL HIGH (ref 70–99)
Glucose-Capillary: 225 mg/dL — ABNORMAL HIGH (ref 70–99)
Glucose-Capillary: 204 mg/dL — ABNORMAL HIGH (ref 70–99)
Glucose-Capillary: 172 mg/dL — ABNORMAL HIGH (ref 70–99)

## 2018-07-24 SURGERY — POSTERIOR LUMBAR FUSION 1 LEVEL
Anesthesia: General | Site: Back

## 2018-07-24 MED ORDER — DEXAMETHASONE SODIUM PHOSPHATE 10 MG/ML IJ SOLN
INTRAMUSCULAR | Status: DC | PRN
  Administered 2018-07-24: 4 mg via INTRAVENOUS

## 2018-07-24 MED ORDER — PHENOL 1.4 % MT LIQD: 1.0000 | OROMUCOSAL | Status: DC | PRN

## 2018-07-24 MED ORDER — SUCCINYLCHOLINE CHLORIDE 200 MG/10ML IV SOSY
PREFILLED_SYRINGE | INTRAVENOUS | Status: AC
  Filled 2018-07-24: qty 10

## 2018-07-24 MED ORDER — SODIUM CHLORIDE 0.9 % IV SOLN
INTRAVENOUS | Status: DC | PRN
  Administered 2018-07-24: 500 mL

## 2018-07-24 MED ORDER — OMNIPOD CLASSIC PODS (GEN 3) MISC: 1.0000 | Freq: Every day | Status: DC

## 2018-07-24 MED ORDER — LOSARTAN POTASSIUM 50 MG PO TABS
50.0000 mg | ORAL_TABLET | Freq: Every day | ORAL | Status: DC
  Administered 2018-07-24: 50 mg via ORAL
  Filled 2018-07-24: qty 1

## 2018-07-24 MED ORDER — SENNA 8.6 MG PO TABS
1.0000 | ORAL_TABLET | Freq: Two times a day (BID) | ORAL | Status: DC
  Administered 2018-07-24 – 2018-07-25 (×2): 8.6 mg via ORAL
  Filled 2018-07-24 (×2): qty 1

## 2018-07-24 MED ORDER — MIDAZOLAM HCL 5 MG/5ML IJ SOLN
INTRAMUSCULAR | Status: DC | PRN
  Administered 2018-07-24: 2 mg via INTRAVENOUS

## 2018-07-24 MED ORDER — INSULIN LISPRO 100 UNIT/ML ~~LOC~~ SOLN: 0.0000 [IU] | Freq: Every day | SUBCUTANEOUS | Status: DC

## 2018-07-24 MED ORDER — EPHEDRINE 5 MG/ML INJ
INTRAVENOUS | Status: AC
  Filled 2018-07-24: qty 10

## 2018-07-24 MED ORDER — DULAGLUTIDE 0.75 MG/0.5ML ~~LOC~~ SOAJ: 0.7500 mg | SUBCUTANEOUS | Status: DC

## 2018-07-24 MED ORDER — FLEET ENEMA 7-19 GM/118ML RE ENEM: 1.0000 | ENEMA | Freq: Once | RECTAL | Status: DC | PRN

## 2018-07-24 MED ORDER — ATORVASTATIN CALCIUM 20 MG PO TABS
40.0000 mg | ORAL_TABLET | Freq: Every day | ORAL | Status: DC
  Administered 2018-07-24: 40 mg via ORAL
  Filled 2018-07-24: qty 2

## 2018-07-24 MED ORDER — THROMBIN 20000 UNITS EX SOLR
CUTANEOUS | Status: DC | PRN
  Administered 2018-07-24: 08:00:00

## 2018-07-24 MED ORDER — CHLORHEXIDINE GLUCONATE CLOTH 2 % EX PADS: 6.0000 | MEDICATED_PAD | Freq: Once | CUTANEOUS | Status: DC

## 2018-07-24 MED ORDER — SODIUM CHLORIDE 0.9% FLUSH
3.0000 mL | Freq: Two times a day (BID) | INTRAVENOUS | Status: DC
  Administered 2018-07-24: 3 mL via INTRAVENOUS

## 2018-07-24 MED ORDER — DOCUSATE SODIUM 100 MG PO CAPS
100.0000 mg | ORAL_CAPSULE | Freq: Two times a day (BID) | ORAL | Status: DC
  Administered 2018-07-24 – 2018-07-25 (×2): 100 mg via ORAL
  Filled 2018-07-24 (×2): qty 1

## 2018-07-24 MED ORDER — INSULIN PUMP
Freq: Three times a day (TID) | SUBCUTANEOUS | Status: DC
  Administered 2018-07-24 (×2): 20 via SUBCUTANEOUS
  Administered 2018-07-24: 18:00:00 via SUBCUTANEOUS
  Administered 2018-07-25: 15 via SUBCUTANEOUS
  Administered 2018-07-25: 20 via SUBCUTANEOUS
  Filled 2018-07-24: qty 1

## 2018-07-24 MED ORDER — BUPIVACAINE HCL (PF) 0.5 % IJ SOLN
INTRAMUSCULAR | Status: AC
  Filled 2018-07-24: qty 30

## 2018-07-24 MED ORDER — 0.9 % SODIUM CHLORIDE (POUR BTL) OPTIME
TOPICAL | Status: DC | PRN
  Administered 2018-07-24: 1000 mL

## 2018-07-24 MED ORDER — BISACODYL 10 MG RE SUPP: 10.0000 mg | Freq: Every day | RECTAL | Status: DC | PRN

## 2018-07-24 MED ORDER — ROCURONIUM BROMIDE 10 MG/ML (PF) SYRINGE
PREFILLED_SYRINGE | INTRAVENOUS | Status: DC | PRN
  Administered 2018-07-24: 50 mg via INTRAVENOUS
  Administered 2018-07-24 (×2): 10 mg via INTRAVENOUS
  Administered 2018-07-24: 20 mg via INTRAVENOUS
  Administered 2018-07-24: 10 mg via INTRAVENOUS

## 2018-07-24 MED ORDER — DEXAMETHASONE SODIUM PHOSPHATE 10 MG/ML IJ SOLN
INTRAMUSCULAR | Status: AC
  Filled 2018-07-24: qty 1

## 2018-07-24 MED ORDER — CEFAZOLIN SODIUM-DEXTROSE 2-4 GM/100ML-% IV SOLN
2.0000 g | INTRAVENOUS | Status: AC
  Administered 2018-07-24: 2 g via INTRAVENOUS

## 2018-07-24 MED ORDER — METHOCARBAMOL 500 MG PO TABS
500.0000 mg | ORAL_TABLET | Freq: Four times a day (QID) | ORAL | Status: DC | PRN
  Administered 2018-07-24 – 2018-07-25 (×2): 500 mg via ORAL
  Filled 2018-07-24 (×2): qty 1

## 2018-07-24 MED ORDER — SODIUM CHLORIDE 0.9 % IV SOLN
INTRAVENOUS | Status: DC | PRN
  Administered 2018-07-24: 25 ug/min via INTRAVENOUS

## 2018-07-24 MED ORDER — ROCURONIUM BROMIDE 50 MG/5ML IV SOSY
PREFILLED_SYRINGE | INTRAVENOUS | Status: AC
  Filled 2018-07-24: qty 5

## 2018-07-24 MED ORDER — ONDANSETRON HCL 4 MG/2ML IJ SOLN
INTRAMUSCULAR | Status: AC
  Filled 2018-07-24: qty 2

## 2018-07-24 MED ORDER — LIDOCAINE-EPINEPHRINE 1 %-1:100000 IJ SOLN
INTRAMUSCULAR | Status: AC
  Filled 2018-07-24: qty 1

## 2018-07-24 MED ORDER — FENTANYL CITRATE (PF) 100 MCG/2ML IJ SOLN
INTRAMUSCULAR | Status: DC | PRN
  Administered 2018-07-24 (×2): 50 ug via INTRAVENOUS
  Administered 2018-07-24: 150 ug via INTRAVENOUS

## 2018-07-24 MED ORDER — FENTANYL CITRATE (PF) 250 MCG/5ML IJ SOLN
INTRAMUSCULAR | Status: AC
  Filled 2018-07-24: qty 5

## 2018-07-24 MED ORDER — THROMBIN (RECOMBINANT) 20000 UNITS EX SOLR
CUTANEOUS | Status: AC
  Filled 2018-07-24: qty 20000

## 2018-07-24 MED ORDER — CEFAZOLIN SODIUM-DEXTROSE 2-4 GM/100ML-% IV SOLN
2.0000 g | Freq: Three times a day (TID) | INTRAVENOUS | Status: AC
  Administered 2018-07-24 (×2): 2 g via INTRAVENOUS
  Filled 2018-07-24 (×2): qty 100

## 2018-07-24 MED ORDER — BUPIVACAINE HCL (PF) 0.5 % IJ SOLN
INTRAMUSCULAR | Status: DC | PRN
  Administered 2018-07-24: 5 mL

## 2018-07-24 MED ORDER — METHOCARBAMOL 1000 MG/10ML IJ SOLN
500.0000 mg | Freq: Four times a day (QID) | INTRAVENOUS | Status: DC | PRN
  Filled 2018-07-24: qty 5

## 2018-07-24 MED ORDER — PROPOFOL 10 MG/ML IV BOLUS
INTRAVENOUS | Status: AC
  Filled 2018-07-24: qty 20

## 2018-07-24 MED ORDER — ALUM & MAG HYDROXIDE-SIMETH 200-200-20 MG/5ML PO SUSP: 30.0000 mL | Freq: Four times a day (QID) | ORAL | Status: DC | PRN

## 2018-07-24 MED ORDER — ONDANSETRON HCL 4 MG PO TABS: 4.0000 mg | ORAL_TABLET | Freq: Four times a day (QID) | ORAL | Status: DC | PRN

## 2018-07-24 MED ORDER — HYDROMORPHONE HCL 1 MG/ML IJ SOLN
INTRAMUSCULAR | Status: AC
  Filled 2018-07-24: qty 1

## 2018-07-24 MED ORDER — ONDANSETRON HCL 4 MG/2ML IJ SOLN: 4.0000 mg | Freq: Four times a day (QID) | INTRAMUSCULAR | Status: DC | PRN

## 2018-07-24 MED ORDER — ACETAMINOPHEN 650 MG RE SUPP: 650.0000 mg | RECTAL | Status: DC | PRN

## 2018-07-24 MED ORDER — ACETAMINOPHEN 325 MG PO TABS: 650.0000 mg | ORAL_TABLET | ORAL | Status: DC | PRN

## 2018-07-24 MED ORDER — HYDROCODONE-ACETAMINOPHEN 5-325 MG PO TABS
1.0000 | ORAL_TABLET | ORAL | Status: DC | PRN
  Administered 2018-07-24: 1 via ORAL
  Administered 2018-07-25: 2 via ORAL
  Filled 2018-07-24: qty 2
  Filled 2018-07-24: qty 1

## 2018-07-24 MED ORDER — PROPOFOL 10 MG/ML IV BOLUS
INTRAVENOUS | Status: DC | PRN
  Administered 2018-07-24: 120 mg via INTRAVENOUS

## 2018-07-24 MED ORDER — ONDANSETRON HCL 4 MG/2ML IJ SOLN
INTRAMUSCULAR | Status: DC | PRN
  Administered 2018-07-24: 4 mg via INTRAVENOUS

## 2018-07-24 MED ORDER — LACTATED RINGERS IV SOLN
INTRAVENOUS | Status: DC | PRN
  Administered 2018-07-24 (×3): via INTRAVENOUS

## 2018-07-24 MED ORDER — SODIUM CHLORIDE 0.9% FLUSH: 3.0000 mL | INTRAVENOUS | Status: DC | PRN

## 2018-07-24 MED ORDER — MORPHINE SULFATE (PF) 4 MG/ML IV SOLN: 4.0000 mg | INTRAVENOUS | Status: DC | PRN

## 2018-07-24 MED ORDER — LIDOCAINE-EPINEPHRINE 1 %-1:100000 IJ SOLN
INTRAMUSCULAR | Status: DC | PRN
  Administered 2018-07-24: 5 mL

## 2018-07-24 MED ORDER — LIDOCAINE HCL (CARDIAC) PF 100 MG/5ML IV SOSY
PREFILLED_SYRINGE | INTRAVENOUS | Status: DC | PRN
  Administered 2018-07-24: 100 mg via INTRAVENOUS

## 2018-07-24 MED ORDER — EPHEDRINE SULFATE-NACL 50-0.9 MG/10ML-% IV SOSY
PREFILLED_SYRINGE | INTRAVENOUS | Status: DC | PRN
  Administered 2018-07-24: 10 mg via INTRAVENOUS

## 2018-07-24 MED ORDER — KETOROLAC TROMETHAMINE 15 MG/ML IJ SOLN
7.5000 mg | Freq: Four times a day (QID) | INTRAMUSCULAR | Status: DC
  Administered 2018-07-24 – 2018-07-25 (×3): 7.5 mg via INTRAVENOUS
  Filled 2018-07-24 (×3): qty 1

## 2018-07-24 MED ORDER — ONDANSETRON HCL 4 MG/2ML IJ SOLN
4.0000 mg | Freq: Once | INTRAMUSCULAR | Status: AC
  Administered 2018-07-24: 4 mg via INTRAVENOUS

## 2018-07-24 MED ORDER — MIDAZOLAM HCL 2 MG/2ML IJ SOLN
INTRAMUSCULAR | Status: AC
  Filled 2018-07-24: qty 2

## 2018-07-24 MED ORDER — HYDROMORPHONE HCL 1 MG/ML IJ SOLN
0.2500 mg | INTRAMUSCULAR | Status: DC | PRN
  Administered 2018-07-24 (×2): 0.5 mg via INTRAVENOUS

## 2018-07-24 MED ORDER — SODIUM CHLORIDE 0.9 % IV SOLN: 250.0000 mL | INTRAVENOUS | Status: DC

## 2018-07-24 MED ORDER — POLYETHYLENE GLYCOL 3350 17 G PO PACK: 17.0000 g | PACK | Freq: Every day | ORAL | Status: DC | PRN

## 2018-07-24 MED ORDER — THROMBIN 5000 UNITS EX SOLR
CUTANEOUS | Status: AC
  Filled 2018-07-24: qty 5000

## 2018-07-24 MED ORDER — MENTHOL 3 MG MT LOZG: 1.0000 | LOZENGE | OROMUCOSAL | Status: DC | PRN

## 2018-07-24 MED ORDER — THROMBIN 5000 UNITS EX SOLR
OROMUCOSAL | Status: DC | PRN
  Administered 2018-07-24: 08:00:00

## 2018-07-24 MED ORDER — LACTATED RINGERS IV SOLN
INTRAVENOUS | Status: DC
  Administered 2018-07-24: 17:00:00 via INTRAVENOUS

## 2018-07-24 SURGICAL SUPPLY — 63 items
BAG DECANTER FOR FLEXI CONT (MISCELLANEOUS) ×3 IMPLANT
BASKET BONE COLLECTION (BASKET) ×3 IMPLANT
BLADE CLIPPER SURG (BLADE) IMPLANT
BUR MATCHSTICK NEURO 3.0 LAGG (BURR) ×3 IMPLANT
CANISTER SUCT 3000ML PPV (MISCELLANEOUS) ×3 IMPLANT
CONT SPEC 4OZ CLIKSEAL STRL BL (MISCELLANEOUS) ×3 IMPLANT
COVER BACK TABLE 60X90IN (DRAPES) ×3 IMPLANT
DECANTER SPIKE VIAL GLASS SM (MISCELLANEOUS) ×3 IMPLANT
DERMABOND ADVANCED (GAUZE/BANDAGES/DRESSINGS) ×2
DERMABOND ADVANCED .7 DNX12 (GAUZE/BANDAGES/DRESSINGS) ×1 IMPLANT
DEVICE DISSECT PLASMABLAD 3.0S (MISCELLANEOUS) ×1 IMPLANT
DRAPE C-ARM 42X72 X-RAY (DRAPES) ×6 IMPLANT
DRAPE HALF SHEET 40X57 (DRAPES) IMPLANT
DRAPE LAPAROTOMY 100X72X124 (DRAPES) ×3 IMPLANT
DRSG OPSITE POSTOP 4X6 (GAUZE/BANDAGES/DRESSINGS) ×3 IMPLANT
DURAPREP 26ML APPLICATOR (WOUND CARE) ×3 IMPLANT
DURASEAL APPLICATOR TIP (TIP) IMPLANT
DURASEAL SPINE SEALANT 3ML (MISCELLANEOUS) IMPLANT
ELECT REM PT RETURN 9FT ADLT (ELECTROSURGICAL) ×3
ELECTRODE REM PT RTRN 9FT ADLT (ELECTROSURGICAL) ×1 IMPLANT
GAUZE 4X4 16PLY RFD (DISPOSABLE) IMPLANT
GAUZE SPONGE 4X4 12PLY STRL (GAUZE/BANDAGES/DRESSINGS) ×3 IMPLANT
GLOVE BIO SURGEON STRL SZ8 (GLOVE) ×3 IMPLANT
GLOVE BIOGEL PI IND STRL 8 (GLOVE) ×1 IMPLANT
GLOVE BIOGEL PI IND STRL 8.5 (GLOVE) ×2 IMPLANT
GLOVE BIOGEL PI INDICATOR 8 (GLOVE) ×2
GLOVE BIOGEL PI INDICATOR 8.5 (GLOVE) ×4
GLOVE ECLIPSE 8.5 STRL (GLOVE) ×9 IMPLANT
GLOVE SURG SS PI 7.5 STRL IVOR (GLOVE) ×18 IMPLANT
GOWN STRL REUS W/ TWL LRG LVL3 (GOWN DISPOSABLE) ×2 IMPLANT
GOWN STRL REUS W/ TWL XL LVL3 (GOWN DISPOSABLE) IMPLANT
GOWN STRL REUS W/TWL 2XL LVL3 (GOWN DISPOSABLE) ×6 IMPLANT
GOWN STRL REUS W/TWL LRG LVL3 (GOWN DISPOSABLE) ×4
GOWN STRL REUS W/TWL XL LVL3 (GOWN DISPOSABLE)
HEMOSTAT POWDER KIT SURGIFOAM (HEMOSTASIS) ×3 IMPLANT
KIT BASIN OR (CUSTOM PROCEDURE TRAY) ×3 IMPLANT
KIT INFUSE SMALL (Orthopedic Implant) ×3 IMPLANT
KIT TURNOVER KIT B (KITS) ×3 IMPLANT
MILL MEDIUM DISP (BLADE) ×3 IMPLANT
NEEDLE HYPO 22GX1.5 SAFETY (NEEDLE) ×3 IMPLANT
NS IRRIG 1000ML POUR BTL (IV SOLUTION) ×3 IMPLANT
PACK LAMINECTOMY NEURO (CUSTOM PROCEDURE TRAY) ×3 IMPLANT
PAD ARMBOARD 7.5X6 YLW CONV (MISCELLANEOUS) ×9 IMPLANT
PATTIES SURGICAL .5 X1 (DISPOSABLE) ×3 IMPLANT
PLASMABLADE 3.0S (MISCELLANEOUS) ×3
PUTTY DBM 5CC ×3 IMPLANT
ROD TI LORDOTIC 5.5X45 (Rod) ×6 IMPLANT
SCREW KODIAK 6.5X45 (Screw) ×12 IMPLANT
SET SCREW (Screw) ×8 IMPLANT
SET SCREW SPNE (Screw) ×4 IMPLANT
SPACER IDENTITI PS 9X9X25 10D (Spacer) ×6 IMPLANT
SPONGE LAP 4X18 RFD (DISPOSABLE) IMPLANT
SPONGE SURGIFOAM ABS GEL 100 (HEMOSTASIS) ×3 IMPLANT
SUT PROLENE 6 0 BV (SUTURE) IMPLANT
SUT VIC AB 1 CT1 18XBRD ANBCTR (SUTURE) ×2 IMPLANT
SUT VIC AB 1 CT1 8-18 (SUTURE) ×4
SUT VIC AB 2-0 CP2 18 (SUTURE) ×3 IMPLANT
SUT VIC AB 3-0 SH 8-18 (SUTURE) ×6 IMPLANT
SYR 3ML LL SCALE MARK (SYRINGE) ×15 IMPLANT
TOWEL GREEN STERILE (TOWEL DISPOSABLE) ×3 IMPLANT
TOWEL GREEN STERILE FF (TOWEL DISPOSABLE) ×3 IMPLANT
TRAY FOLEY MTR SLVR 16FR STAT (SET/KITS/TRAYS/PACK) ×3 IMPLANT
WATER STERILE IRR 1000ML POUR (IV SOLUTION) ×3 IMPLANT

## 2018-07-24 NOTE — Progress Notes (Signed)
Patient ID: Samuel [name], male   DOB: 06-11-1951, 67 y.o.   MRN: [id number] Vital signs are stable Motor function is intact Patient is doing quite well for initial postop He has voided since Foley has been out Stable postop

## 2018-07-24 NOTE — Op Note (Signed)
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  History of fusion L3-L4. Postoperative diagnosis: Same Procedure: Lumbar laminectomy L2-L3 decompression of the L2 and the L3 nerve roots with more work than required for simple interbody technique.  Posterior lumbar interbody arthrodesis with titanium spacers local autograft and allograft with infuse L2-L3.  Removal of previous hardware from L3-L4.  Placement of radicular fixation L2-L3.  Posterior lateral arthrodesis L2-L3 with local autograft allograft and infuse.  Surgeon: Barnett AbuHenry Hillery Zachman first assistant: Donalee CitrinGary Cram MD Anesthesia: General endotracheal Indications: The patient is a 67 year old individual who has severe spondylitic stenosis at L2-L3.  She has been having increasing pain for couple of years.  He is been advised regarding the need for surgical decompression and arthrodesis at L2-L3 having had a traumatic dislocation at L3-L4 some years ago requiring surgical stabilization.  Procedure: The patient was brought to the operating room supine on the stretcher.  After the smooth induction of general endotracheal anesthesia, he was turned prone.  The back was prepped with alcohol DuraPrep and draped in sterile fashion.  Previous incision was reopened and carried down to the lumbodorsal fascia.  Dissection was carried out laterally to expose the old hardware.  The hardware was then removed sequentially removing the Synthes click X type screws that were placed.  Then the dissection was carried superiorly to expose the transverse processes of L2.  The lamina and laminar arch of L2 were also exposed.  Laminectomy was then created removing the inferior margin lamina of L2 L2 and including the entirety of the facets at L2-L3.  This was done bilaterally.  The dissection was then carried out the lateral gutters to decompress superiorly the L2 nerve root and inferiorly the L3 nerve root.  In this  process the disc space was isolated.  Once both sides were decompressed the disc space was entered and a combination of 2 and 3 mm punches were used to enter the disc space and gradually distract the disc space with a series of disc shavers measuring at first 6 mm then 7 mm and 8 mm.  The distraction reduced the retrolisthesis.  The disc space was evacuated of a substantial quantity of severely degenerated and desiccated disc material.  In the end the interspace was sized for appropriate size spacer and was felt that a 9 mm tall 10 degree lordotic spacer would fit best into the interval.  The endplates were amply prepared by decorticating using a toothed curette and then a total of 6 cc of autograft allograft and infuse was placed into the interspace with 2 9 mm tall titanium spacers.  Once the interspace was completely packed then pedicle entry sites were chosen at L2 and under radiographic control 6.5 x 45 mm screws were placed into L2.  L3 had 6.5 x 45 mm screws placed and 2 short 40 mm connecting rods were used to connect the construct together.  Lateral gutters which had been previously decorticated were packed with the remainder of approximately 9 cc of bone graft.  Then once the area was inspected and it was noted that the L2 and L3 nerve roots were well decompressed and hemostasis was achieved in the soft tissues the lumbodorsal fascia was closed with #1 Vicryl interrupted fashion 2-0 Vicryl was used in subtenons tissue and 3-0 Vicryl was used to close subcuticular skin.  Dermabond was placed on the skin.  Blood loss was estimated 300 cc.

## 2018-07-24 NOTE — Plan of Care (Signed)
  Problem: Safety: Goal: Ability to remain free from injury will improve Outcome: Progressing   

## 2018-07-24 NOTE — Anesthesia Postprocedure Evaluation (Signed)
Anesthesia Post Note  Patient: Manuela SchwartzLuis O Besser  Procedure(s) Performed: Lumbar Two-Three Posterior lumbar interbody fusion (N/A Back)     Patient location during evaluation: PACU Anesthesia Type: General Level of consciousness: awake and alert Pain management: pain level controlled Vital Signs Assessment: post-procedure vital signs reviewed and stable Respiratory status: spontaneous breathing, nonlabored ventilation, respiratory function stable and patient connected to nasal cannula oxygen Cardiovascular status: blood pressure returned to baseline and stable Postop Assessment: no apparent nausea or vomiting Anesthetic complications: no    Last Vitals:  Vitals:   07/24/18 1305 07/24/18 1327  BP: 133/68 139/79  Pulse: 88 83  Resp: 19 16  Temp: (!) 36.2 C 36.7 C  SpO2: 96% 95%    Last Pain:  Vitals:   07/24/18 1327  TempSrc: Oral  PainSc:                  Blannie Shedlock L Ladean Steinmeyer

## 2018-07-24 NOTE — Progress Notes (Addendum)
Inpatient Diabetes Program Recommendations  AACE/ADA: New Consensus Statement on Inpatient Glycemic Control (2015)  Target Ranges:  Prepandial:   less than 140 mg/dL      Peak postprandial:   less than 180 mg/dL (1-2 hours)      Critically ill patients:  140 - 180 mg/dL   Results for Samuel SchwartzGUEVARA, [name] (MRN [id number]) as of 07/24/2018 11:42  Ref. Range 07/24/2018 06:28 07/24/2018 08:50 07/24/2018 10:03 07/24/2018 11:10  Glucose-Capillary Latest Ref Range: 70 - 99 mg/dL 409145 (H) 811132 (H) 914168 (H) 172 (H)   Review of Glycemic Control  Diabetes history: DM 2 Outpatient Diabetes medications: Humalog Omni pod insulin pump, Trulicity 0.75 mg Q Thursday, Invokamet 50-1000 mg BID Current orders for Inpatient glycemic control: insulin pump order set  A1c 7.9% on 9/11  Inpatient Diabetes Program Recommendations:    Consult for Postop DM management; Spoke with Dr. Danielle DessElsner. Will order insulin pump order set to resume patient's insulin pump while inpatient. Noted patient is also on oral meds at home. Will follow trends while inpatient. Discussed with Whitney, PACU RN.  Saw patient on unit 3C. Patient has alittle over of 37 units of basal insulin in a 24 hour period. Patient also reports bolusing 12 units breakfast, 10 units lunch, and 18 units for supper.   Noted patient received Decadron 4 units during surgery. Glucose trends in very low 200 range. Glucose trends should trend down in the next 24-48 hours.   Thanks,  Christena DeemShannon Loralye Loberg RN, MSN, BC-ADM Inpatient Diabetes Coordinator Team Pager [phone] (8a-5p)

## 2018-07-24 NOTE — Progress Notes (Signed)
Insulin pump removed from left arm per Dr. Armond HangWoodrum.  Will continue to monitor blood sugars.

## 2018-07-24 NOTE — Anesthesia Procedure Notes (Signed)
Procedure Name: Intubation Date/Time: 07/24/2018 7:40 AM Performed by: Carney Living, CRNA Pre-anesthesia Checklist: Patient identified, Emergency Drugs available, Patient being monitored, Timeout performed and Suction available Patient Re-evaluated:Patient Re-evaluated prior to induction Oxygen Delivery Method: Circle system utilized Preoxygenation: Pre-oxygenation with 100% oxygen Induction Type: IV induction Ventilation: Mask ventilation without difficulty and Oral airway inserted - appropriate to patient size Laryngoscope Size: Mac and 4 Grade View: Grade II Tube type: Oral Tube size: 7.5 mm Number of attempts: 1 Airway Equipment and Method: Stylet Placement Confirmation: ETT inserted through vocal cords under direct vision,  positive ETCO2 and breath sounds checked- equal and bilateral Secured at: 22 cm Tube secured with: Tape Dental Injury: Teeth and Oropharynx as per pre-operative assessment

## 2018-07-24 NOTE — H&P (Signed)
CHIEF COMPLAINT: Leg pain radiating from the waist down, sharp pains with sitting.  HISTORY OF PRESENT ILLNESS: [name] is a 67 year old right-handed individual who tells me that he had back surgery after an injury 9613 or so years ago. This was done by Dr. Corky SingScott Ellison in [place]. He underwent decompression and fusion at the level of L3-L4. For several years, he got along fairly well, but for about the past 4 years he has been having increasing pain in his back with radiation into his lower extremities and difficulty with standing for any length of time, difficulty with sitting for any length of time, aggravating substantial pain. Feels that his legs are weak and at night he feels that his legs are very jumpy. He has had an MRI of the lumbar spine performed on January 12 of this year and he is referred here for further consideration of surgical intervention. He has evidence on the current MRI of significant stenosis at the level of L2-L3, immediately above his previous fusion at L3-L4. Overall, the rest of his spine shows an amply patent central canal and lateral recesses, but he has substantial subarticular stenosis at the level of L2-L3 where he has a retrolisthesis. He also has had flexion/extension films obtained in the office today which demonstrate that the retrolisthesis appears to be fairly fixed between flexion and extension. The alignment in the coronal plane is good.  PAST MEDICAL HISTORY: Reveals that he has a longstanding history of diabetes for more than 30 years. He is also hypertensive.  MEDICATIONS: His current medications include Losartan, Humalog, Metformin, and Atorvastatin.  SYSTEMS REVIEW: Notable for the one complaint of leg pain on a 14-point review sheet noted in the office today.  PHYSICAL EXAMINATION: I note that he stands straight and erect. He walks without any obvious antalgia and demonstrates good strength in the tibialis anterior and gastrocs in addition to  the iliopsoas and the quads as noted by toe and heel walking. His reflexes are symmetrically depressed in the patellae and the Achilles bilaterally and straight leg raising is positive at 15 degrees in either lower extremity with negative Patrick's maneuver bilaterally.  IMPRESSION: The patient has evidence of a significant stenosis at the level of L2-L3 above his previously placed fusion at L3-4. I indicated that ultimately he will require surgical decompression and stabilization of the L2-3 level. Because of his diabetes, I would not recommend any epidural steroid injections and I do not feel that he is likely to be amenable to any conservative treatment, given that the symptoms have been progressively worsening for 4 years' time. I noted that the surgery does have some risks because of his diabetes, but it seemed that he healed his previous surgery quite well. I would hope that he has an equally successful outcome from this surgery. 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 This may be related to his diabetes also, but I would hope that his back and legs do feel better with a decent decompression at the level of L2-L3 and good stability in the spine otherwise. We can plan and schedule the surgery at his convenience.

## 2018-07-24 NOTE — Progress Notes (Signed)
07/24/18 1741  PT Evaluation and Discharge Information  Last PT Received On 07/24/18  Assistance Needed +1  History of Present Illness Pt is a 67 y/o male s/p L2-3 PLIF. PMH includes DM and HTN.   Precautions  Precautions Back  Precaution Booklet Issued Yes (comment)  Precaution Comments Reviewed back precautions with pt and family.   Required Braces or Orthoses Spinal Brace  Spinal Brace Lumbar corset;Applied in sitting position  Restrictions  Weight Bearing Restrictions No  Home Living  Family/patient expects to be discharged to: Private residence  Living Arrangements Spouse/significant other;Children  Available Help at Discharge Family;Available 24 hours/day  Type of Home House  Home Access Level entry  Home Layout One level  Bathroom Shower/Tub Tub/shower unit;Curtain  Tour managerBathroom Toilet Standard  Home Equipment None  Prior Function  Level of Independence Independent  Communication  Communication No difficulties  Pain Assessment  Pain Assessment 0-10  Pain Score 8  Pain Location back   Pain Descriptors / Indicators Guarding;Operative site guarding  Pain Intervention(s) Limited activity within patient's tolerance;Monitored during session;Repositioned  Cognition  Arousal/Alertness Awake/alert  Behavior During Therapy WFL for tasks assessed/performed  Overall Cognitive Status Within Functional Limits for tasks assessed  Upper Extremity Assessment  Upper Extremity Assessment Defer to OT evaluation  Lower Extremity Assessment  Lower Extremity Assessment Overall WFL for tasks assessed  Cervical / Trunk Assessment  Cervical / Trunk Assessment Other exceptions  Cervical / Trunk Exceptions s/p PLIF   Bed Mobility  Overal bed mobility Needs Assistance  Bed Mobility Rolling;Sidelying to Sit;Sit to Sidelying  Rolling Supervision  Sidelying to sit Supervision  Sit to sidelying Supervision  General bed mobility comments Verbal cues for log roll technique   Transfers  Overall  transfer level Needs assistance  Equipment used Rolling walker (2 wheeled)  Transfers Sit to/from Stand  Sit to Stand Supervision  General transfer comment Supervision for safety. Increased time required. Pt with good technique and able to maintain back precautions throughout.   Ambulation/Gait  Ambulation/Gait assistance Supervision;Min guard  Gait Distance (Feet) 150 Feet  Assistive device Rolling walker (2 wheeled)  Gait Pattern/deviations Step-through pattern;Decreased stride length  General Gait Details Slow, guarded gait. Overall steady with use of RW. Educated about use of RW at home to increase safety. Educated about generalized walking program to perform at home.   Gait velocity Decreased   Balance  Overall balance assessment Needs assistance  Sitting-balance support No upper extremity supported;Feet supported  Sitting balance-Leahy Scale Good  Standing balance support Bilateral upper extremity supported;During functional activity  Standing balance-Leahy Scale Poor  Standing balance comment Reliant on BUE support   General Comments  General comments (skin integrity, edema, etc.) Pt's daughters and wife present throughout session.   PT - End of Session  Equipment Utilized During Treatment Gait belt;Back brace  Activity Tolerance Patient tolerated treatment well  Patient left in bed;with call bell/phone within reach;with family/visitor present  Nurse Communication Mobility status  PT Assessment  PT Recommendation/Assessment Patent does not need any further PT services  PT Visit Diagnosis Other abnormalities of gait and mobility (R26.89);Pain  Pain - part of body  (back )  No Skilled PT All education completed;Patient will have necessary level of assist by caregiver at discharge  AM-PAC PT "6 Clicks" Daily Activity Outcome Measure  Difficulty turning over in bed (including adjusting bedclothes, sheets and blankets)? 3  Difficulty moving from lying on back to sitting on the  side of the bed?  3  Difficulty  sitting down on and standing up from a chair with arms (e.g., wheelchair, bedside commode, etc,.)? 3  Help needed moving to and from a bed to chair (including a wheelchair)? 3  Help needed walking in hospital room? 3  Help needed climbing 3-5 steps with a railing?  3  6 Click Score 18  Mobility G Code  CK  PT Recommendation  Follow Up Recommendations No PT follow up  PT equipment Rolling walker with 5" wheels  Individuals Consulted  Consulted and Agree with Results and Recommendations Patient;Family member/caregiver  Family Member Consulted wife and daughters   Acute Rehab PT Goals  Patient Stated Goal home maybe today, if not then tomorrow  PT Goal Formulation With patient  Time For Goal Achievement 07/24/18  Potential to Achieve Goals Good  PT Time Calculation  PT Start Time (ACUTE ONLY) 1656  PT Stop Time (ACUTE ONLY) 1713  PT Time Calculation (min) (ACUTE ONLY) 17 min  PT General Charges  $$ ACUTE PT VISIT 1 Visit  PT Evaluation  $PT Eval Low Complexity 1 Low  Written Expression  Dominant Hand Right   Patient evaluated by Physical Therapy with no further acute PT needs identified. All education has been completed and the patient has no further questions. Pt overall steady using RW and requiring min guard to supervision for safety. Educated about back precautions and generalized walking program. Pt's daughters reports they and pt's wife will be able to assist as needed upon d/c. See below for any follow-up Physical Therapy or equipment needs. PT is signing off. Thank you for this referral. If needs change, please re-consult.   Gladys Damme, PT, DPT  Acute Rehabilitation Services  Pager: [phone] Office: [phone]

## 2018-07-24 NOTE — Evaluation (Addendum)
Occupational Therapy Evaluation and Discharge Patient Details Name: JONTEZ REDFIELD MRN: [id number] DOB: 02/14/1951 Today's Date: 07/24/2018    History of Present Illness Lumbar laminectomy L2-L3 decompression of the L2 and the L3 nerve roots with fusion   Clinical Impression   This 67 yo male admitted and underwent above presents to acute OT with all education completed and post op back handout given. We will D/C from acute OT.    Follow Up Recommendations  No OT follow up    Equipment Recommendations  None recommended by OT       Precautions / Restrictions Precautions Precautions: Back Precaution Booklet Issued: Yes (comment) Required Braces or Orthoses: Spinal Brace Spinal Brace: Applied in sitting position;Applied in standing position Restrictions Weight Bearing Restrictions: No      Mobility Bed Mobility Overal bed mobility: Needs Assistance Bed Mobility: Rolling;Sidelying to Sit;Sit to Sidelying Rolling: Min guard Sidelying to sit: Min guard     Sit to sidelying: Min guard General bed mobility comments: VCs for sequencing  Transfers Overall transfer level: Needs assistance Equipment used: 1 person hand held assist Transfers: Sit to/from Stand Sit to Stand: Min assist              Balance Overall balance assessment: Mild deficits observed, not formally tested                                         ADL either performed or assessed with clinical judgement   ADL                                         General ADL Comments: Educated pt and family on use of 2 cups for mouth care, use of wet wipes for back peri care, limiting sitting to 20-30 minutes at a time, sequence of dressing, donning brace, sit<>stand stance for toilet transfers, side stepping into tub. Family will A with LBADLs until pt can do them by himself again     Vision Patient Visual Report: No change from baseline              Pertinent Vitals/Pain  Pain Assessment: 0-10 Pain Score: 8  Pain Location: incisional Pain Descriptors / Indicators: Guarding;Operative site guarding Pain Intervention(s): Limited activity within patient's tolerance;Monitored during session;Repositioned     Hand Dominance Right   Extremity/Trunk Assessment Upper Extremity Assessment Upper Extremity Assessment: Overall WFL for tasks assessed           Communication Communication Communication: No difficulties   Cognition Arousal/Alertness: Awake/alert Behavior During Therapy: WFL for tasks assessed/performed Overall Cognitive Status: Within Functional Limits for tasks assessed                                                Home Living Family/patient expects to be discharged to:: Private residence Living Arrangements: Spouse/significant other;Children Available Help at Discharge: Family;Available 24 hours/day Type of Home: House Home Access: Level entry     Home Layout: One level     Bathroom Shower/Tub: Tub/shower unit;Curtain   Bathroom Toilet: Standard     Home Equipment: None          Prior Functioning/Environment Level of Independence:  Independent                 OT Problem List: Decreased range of motion;Impaired balance (sitting and/or standing);Pain         OT Goals(Current goals can be found in the care plan section) Acute Rehab OT Goals Patient Stated Goal: home maybe today, if not then tomorrow  OT Frequency:                AM-PAC PT "6 Clicks" Daily Activity     Outcome Measure Help from another person eating meals?: None Help from another person taking care of personal grooming?: A Little Help from another person toileting, which includes using toliet, bedpan, or urinal?: A Little Help from another person bathing (including washing, rinsing, drying)?: A Little Help from another person to put on and taking off regular upper body clothing?: A Little Help from another person to put on  and taking off regular lower body clothing?: A Lot 6 Click Score: 18   End of Session Equipment Utilized During Treatment: Back brace  Activity Tolerance: Patient tolerated treatment well Patient left: in bed;with call bell/phone within reach;with family/visitor present  OT Visit Diagnosis: Unsteadiness on feet (R26.81);Pain Pain - part of body: (back)                Time: [id number] OT Time Calculation (min): 43 min Charges:  OT General Charges $OT Visit: 1 Visit OT Evaluation $OT Eval Moderate Complexity: 1 Mod OT Treatments $Self Care/Home Management : 23-37 mins  Ignacia Palmaathy Aryssa Rosamond, OTR/L [facility] Pager [phone] Office [phone]

## 2018-07-24 NOTE — Transfer of Care (Signed)
Immediate Anesthesia Transfer of Care Note  Patient: Samuel [name]  Procedure(s) Performed: Lumbar Two-Three Posterior lumbar interbody fusion (N/A Back)  Patient Location: PACU  Anesthesia Type:General  Level of Consciousness: awake, alert , oriented and patient cooperative  Airway & Oxygen Therapy: Patient Spontanous Breathing and Patient connected to nasal cannula oxygen  Post-op Assessment: Report given to RN, Post -op Vital signs reviewed and stable and Patient moving all extremities X 4  Post vital signs: Reviewed and stable  Last Vitals:  Vitals Value Taken Time  BP 160/90 07/24/2018 11:10 AM  Temp    Pulse 87 07/24/2018 11:13 AM  Resp 17 07/24/2018 11:13 AM  SpO2 99 % 07/24/2018 11:13 AM  Vitals shown include unvalidated device data.  Last Pain:  Vitals:   07/24/18 0630  TempSrc: Oral  PainSc:       Patients Stated Pain Goal: 3 (07/24/18 0617)  Complications: [name] apparent anesthesia complications

## 2018-07-24 NOTE — Progress Notes (Signed)
Patient states that his insulin pump does not have a basal rate.  Patient stated that he last had a bolus of 20 units last night but has not given a bolus and will not give a bolus since he has had nothing to eat or drink.   Patient has remote for insulin pump and daughter will be keeping pump.   Interpreter present at bedside.

## 2018-07-25 LAB — GLUCOSE, CAPILLARY
GLUCOSE-CAPILLARY: 180 mg/dL — AB (ref 70–99)
Glucose-Capillary: 106 mg/dL — ABNORMAL HIGH (ref 70–99)
Glucose-Capillary: 138 mg/dL — ABNORMAL HIGH (ref 70–99)

## 2018-07-25 MED ORDER — DIAZEPAM 5 MG PO TABS: 5.0000 mg | ORAL_TABLET | Freq: Four times a day (QID) | ORAL | 0 refills | Status: DC | PRN

## 2018-07-25 MED ORDER — HYDROCODONE-ACETAMINOPHEN 5-325 MG PO TABS: 1.0000 | ORAL_TABLET | ORAL | 0 refills | Status: DC | PRN

## 2018-07-25 MED FILL — Thrombin (Recombinant) For Soln 20000 Unit: CUTANEOUS | Qty: 1 | Status: AC

## 2018-07-25 NOTE — Discharge Summary (Signed)
Physician Discharge Summary  Patient ID: Samuel [name] MRN: [id number] DOB/AGE: Jul 21, 1951 67 y.o.  Admit date: 07/24/2018 Discharge date: 07/25/2018  Admission Diagnoses: Lumbar stenosis with radiculopathy L3-L4.  History of fusion L4-L5  Discharge Diagnoses: Lumbar stenosis with radiculopathy L3-L4.  History of fusion L4-L5.  Diabetes mellitus.  Neurogenic claudication. Active Problems:   Lumbar stenosis with neurogenic claudication   Discharged Condition: good  Hospital Course: Patient was admitted to undergo surgical decompression and stabilization at L3-L4 above a previously done fusion at L4-L5.  He tolerated surgery well.  Diabetes has been managed well.  Consults: Diabetic coordinator  Significant Diagnostic Studies: None  Treatments: surgery: Laminectomy L3-L4 with decompression of the L3 and L4 nerve roots posterior lumbar interbody arthrodesis peek spacers local autograft allograft posterior lateral arthrodesis with local autograft allograft and infuse.  Discharge Exam: Blood pressure (!) 106/57, pulse 72, temperature 99.6 F (37.6 C), temperature source Oral, resp. rate 16, height 5\' 4"  (1.626 m), weight 95.3 kg, SpO2 96 %. Incision is clean and dry.  Station and gait are intact.  Disposition: Discharge disposition: 01-Home or Self Care       Discharge Instructions    Call MD for:  redness, tenderness, or signs of infection (pain, swelling, redness, odor or green/yellow discharge around incision site)   Complete by:  As directed    Call MD for:  severe uncontrolled pain   Complete by:  As directed    Call MD for:  temperature >100.4   Complete by:  As directed    Diet - low sodium heart healthy   Complete by:  As directed    Discharge instructions   Complete by:  As directed    Okay to shower. Do not apply salves or appointments to incision. No heavy lifting with the upper extremities greater than 15 pounds. May resume driving when not requiring pain  medication and patient feels comfortable with doing so.   Incentive spirometry RT   Complete by:  As directed    Increase activity slowly   Complete by:  As directed      Allergies as of 07/25/2018   No Known Allergies     Medication List    TAKE these medications   aspirin EC 81 MG tablet Take 81 mg by mouth at bedtime.   atorvastatin 40 MG tablet Commonly known as:  LIPITOR TAKE 1 TABLET BY MOUTH DAILY AND 1/2 TABLET BY MOUTH EVERY NIGHT AT BEDTIME What changed:    how much to take  how to take this  when to take this   diazepam 5 MG tablet Commonly known as:  VALIUM Take 1 tablet (5 mg total) by mouth every 6 (six) hours as needed for muscle spasms.   doxycycline 100 MG tablet Commonly known as:  VIBRA-TABS Take 1 tablet (100 mg total) by mouth 2 (two) times daily.   Dulaglutide 0.75 MG/0.5ML Sopn INJECT 0.75 MG INTO THE SKIN ONCE A WEEK What changed:    how much to take  how to take this  when to take this   glucose blood test strip USE TO TEST BLOOD SUGAR THREE TIMES DAILY AS DIRECTED   HYDROcodone-acetaminophen 5-325 MG tablet Commonly known as:  NORCO/VICODIN Take 1-2 tablets by mouth every 4 (four) hours as needed for severe pain ((score 7 to 10)).   insulin lispro 100 UNIT/ML injection Commonly known as:  HUMALOG Inject 0.9 mLs (90 Units total) into the skin daily. What changed:    how much  to take  additional instructions   Insulin Pen Needle 32G X 4 MM Misc Use 5 pens per day to inject insulin What changed:  Another medication with the same name was changed. Make sure you understand how and when to take each.   B-D UF III MINI PEN NEEDLES 31G X 5 MM Misc Generic drug:  Insulin Pen Needle USE FIVE PER DAY AS DIRECTED What changed:  See the new instructions.   INSULIN SYRINGE .5CC/30GX5/16" 30G X 5/16" 0.5 ML Misc Use 3 times a day for insulin   INVOKAMET XR 50-1000 MG Tb24 Generic drug:  Canagliflozin-metFORMIN HCl ER TAKE 2 TABLETS  BY MOUTH EVERY DAY What changed:    how much to take  when to take this  additional instructions   losartan 50 MG tablet Commonly known as:  COZAAR TAKE 1 TABLET(50 MG) BY MOUTH DAILY What changed:  See the new instructions.   meloxicam 7.5 MG tablet Commonly known as:  MOBIC Take 1-2 tablets (7.5-15 mg total) by mouth daily.   NON FORMULARY Take 2 capsules by mouth 2 (two) times daily. 4LIFE TRANSFER FACTORY CARDIO   OMNIPOD 5 PACK Misc 1 each by Does not apply route daily. Apply 1 OmniPod to body one time every 3 days. What changed:  additional instructions   OVER THE COUNTER MEDICATION Take 2 tablets by mouth 2 (two) times daily. 4LIFE TRANSFER FACTORY RECALL   traMADol 50 MG tablet Commonly known as:  ULTRAM Take 1 tablet (50 mg total) by mouth every 6 (six) hours as needed.            Durable Medical Equipment  (From admission, onward)         Start     Ordered   07/25/18 0917  For home use only DME Walker  Once    Question:  Patient needs a walker to treat with the following condition  Answer:  S/P lumbar spinal fusion   07/25/18 0916         Follow-up Information    Barnett Abu, MD Follow up.   Specialty:  Neurosurgery Contact information: [address] [place] [address] [phone]           Signed: Stefani Dama 07/25/2018, 11:43 AM

## 2018-07-25 NOTE — Discharge Instructions (Signed)

## 2018-07-25 NOTE — Progress Notes (Signed)
Patient is discharged from room 3C07 at this time. Alert and in stable condition. IV site d/c'd and instructions read to patient and daughter with understanding verbalized. Left unit via wheelchair with all belongings at side.  

## 2018-07-26 ENCOUNTER — Telehealth: Payer: Self-pay

## 2018-07-26 NOTE — Telephone Encounter (Signed)
TCM call made to patient. Left message for return call.  

## 2018-07-27 ENCOUNTER — Telehealth: Payer: Self-pay

## 2018-07-27 NOTE — Telephone Encounter (Signed)
Transition Care Management Follow-up Telephone Call  ADMISSION DATE: 07/24/18  DISCHARGE DATE: 07/25/18   How have you been since you were released from the hospital?  Having pain  But getting better little by little.  Do you understand why you were in the hospital? Yes   Do you understand the discharge instrcutions? Yes     Items Reviewed:  Medications reviewed: Yes   Allergies reviewed: NKDA   Dietary changes reviewed: Low sodium heart healthy   Referrals reviewed: Appointment scheduled.   Functional Questionnaire:   Activities of Daily Living (ADLs): Patient  states he can perform   Any patient concerns?  Drainage at incision site  Confirmed importance and date/time of follow-up visits scheduled: Yes  Confirmed with patient if condition begins to worsen call PCP or go to the ER. Yes    Patient was given the office number and encouragred to call back with questions or concerns.Yes Yes

## 2018-07-28 ENCOUNTER — Other Ambulatory Visit: Payer: Self-pay | Admitting: Medical

## 2018-07-28 ENCOUNTER — Ambulatory Visit (HOSPITAL_BASED_OUTPATIENT_CLINIC_OR_DEPARTMENT_OTHER)
Admission: RE | Admit: 2018-07-28 | Discharge: 2018-07-28 | Disposition: A | Discharge status: Home / Self Care | Payer: Medicare Other | Source: Ambulatory Visit | Attending: Medical | Admitting: Medical

## 2018-07-28 ENCOUNTER — Encounter: Payer: Self-pay | Admitting: Medical

## 2018-07-28 ENCOUNTER — Ambulatory Visit (INDEPENDENT_AMBULATORY_CARE_PROVIDER_SITE_OTHER): Payer: Medicare Other | Admitting: Medical

## 2018-07-28 VITALS — BP 132/70 | HR 65 | Temp 98.0°F | Resp 16 | Ht 64.0 in | Wt 202.2 lb

## 2018-07-28 DIAGNOSIS — T148XXA Other injury of unspecified body region, initial encounter: Secondary | ICD-10-CM | POA: Diagnosis not present

## 2018-07-28 DIAGNOSIS — K5900 Constipation, unspecified: Secondary | ICD-10-CM | POA: Insufficient documentation

## 2018-07-28 DIAGNOSIS — L089 Local infection of the skin and subcutaneous tissue, unspecified: Secondary | ICD-10-CM

## 2018-07-28 DIAGNOSIS — M545 Low back pain, unspecified: Secondary | ICD-10-CM

## 2018-07-28 MED ORDER — KETOROLAC TROMETHAMINE 60 MG/2ML IM SOLN
60.0000 mg | Freq: Once | INTRAMUSCULAR | Status: AC
  Administered 2018-07-28: 60 mg via INTRAMUSCULAR

## 2018-07-28 MED ORDER — MELOXICAM 7.5 MG PO TABS: 7.5000 mg | ORAL_TABLET | Freq: Every day | ORAL | 0 refills | Status: DC

## 2018-07-28 MED ORDER — DOXYCYCLINE HYCLATE 100 MG PO TABS: 100.0000 mg | ORAL_TABLET | Freq: Two times a day (BID) | ORAL | 0 refills | Status: DC

## 2018-07-28 NOTE — Patient Instructions (Addendum)
Your surgical site/laceration overall looks like it is healing well.  Little bit of pinkish/redness to the edges of wound.  Some slight serosanguineous drainage present.  We will go ahead and get wound culture and will go ahead and place you on 5 days of doxycycline pending culture results.  I think this is a good idea as we approached the weekend.  Also you report of 4 days with no bowel movement post surgery.  Also using Norco every 4 hours for pain.  Being post surgery and taking narcotics likely cause for the constipation.  We gave you Toradol 60 mg IM with the hopes that you will not need narcotics for the next 24 hours.  I do want you to stay well-hydrated and try MiraLAX over-the-counter to see if this helps with a bowel movement.  Hopefully if you do have bowel movement then you could resume the narcotic/Norco 1 every 4-6 hours(but only for severe pain).  Starting tomorrow around noon you could also resume your meloxicam.  Will get x-ray today to assess degree of constipation.  And see if they make any comments regarding possible obstruction.  If any severe abdomen bloating, cramping, pain, nausea or vomiting over the weekend then advised to be seen in the emergency department.  Follow-up with your surgeon next week as scheduled.  With me as needed or with me as needed if follow-up appoint with surgeon is delayed.

## 2018-07-28 NOTE — Progress Notes (Signed)
Subjective:    Patient ID: Samuel [name], male    DOB: 13-Sep-1951, 67 y.o.   MRN: [id number]  HPI  Pt in today reporting he had back surgery on Monday. He states he is gradually improving with less pain each day.   He had laminectomy L3-L4 with decompression of the L3 and L4 nerve roots posterior lumbar interbody arthrodesis peek spacers local autograft allograft posterior lateral arthrodesis with local autograft allograft and infuse.  Pt has follow up with neurosurgeon in 5-6 days.   Pt states 2 days ago he started some mild water discharge from the laceration line. But no yellow dc. No fever, no chills, no sweats.  Doxy was on list of meds on dc summary.  Pt also states no bm since his surgery. Pt is on norco for pain. He is taking 1 tab every 4 hours. Pt tried some magnesium citrate type product over the counter but did not induce bm.Now feels mild bloated but no pain. No nausea or vomiting. Pt never had any abdomen surgeries in past.   On review pt has valium, norco and meloxicam for pain.   Review of Systems  Constitutional: Negative for chills, fatigue and fever.  Respiratory: Negative for cough, chest tightness, shortness of breath and wheezing.   Cardiovascular: Negative for chest pain and palpitations.  Gastrointestinal: Positive for constipation. Negative for abdominal distention, abdominal pain, diarrhea, nausea and vomiting.       Mild bloated sensation.  Musculoskeletal: Positive for back pain.  Skin: Negative for rash.  Neurological: Negative for dizziness, weakness, numbness and headaches.  Hematological: Negative for adenopathy. Does not bruise/bleed easily.  Psychiatric/Behavioral: Negative for behavioral problems and confusion.   Past Medical History:  Diagnosis Date  . Blood transfusion without reported diagnosis   . Diabetes mellitus without complication (HCC)    type II  . History of ETOH abuse   . Hypertension      Social History   Socioeconomic  History  . Marital status: Married    Spouse name: Not on file  . Number of children: Not on file  . Years of education: Not on file  . Highest education level: Not on file  Occupational History  . Not on file  Social Needs  . Financial resource strain: Not on file  . Food insecurity:    Worry: Not on file    Inability: Not on file  . Transportation needs:    Medical: Not on file    Non-medical: Not on file  Tobacco Use  . Smoking status: Former Games developer  . Smokeless tobacco: Former Neurosurgeon    Quit date: 11/08/1993  Substance and Sexual Activity  . Alcohol use: No    Alcohol/week: 0.0 standard drinks    Comment: occasional  . Drug use: No  . Sexual activity: Not on file  Lifestyle  . Physical activity:    Days per week: Not on file    Minutes per session: Not on file  . Stress: Not on file  Relationships  . Social connections:    Talks on phone: Not on file    Gets together: Not on file    Attends religious service: Not on file    Active member of club or organization: Not on file    Attends meetings of clubs or organizations: Not on file    Relationship status: Not on file  . Intimate partner violence:    Fear of current or ex partner: Not on file  Emotionally abused: Not on file    Physically abused: Not on file    Forced sexual activity: Not on file  Other Topics Concern  . Not on file  Social History Narrative  . Not on file    Past Surgical History:  Procedure Laterality Date  . BACK SURGERY N/A 2010    Family History  Problem Relation Age of Onset  . Diabetes Unknown   . Hypertension Unknown   . Arthritis Father   . Diabetes Father     No Known Allergies  Current Outpatient Medications on File Prior to Visit  Medication Sig Dispense Refill  . aspirin EC 81 MG tablet Take 81 mg by mouth at bedtime.    [name]. atorvastatin (LIPITOR) 40 MG tablet TAKE 1 TABLET BY MOUTH DAILY AND 1/2 TABLET BY MOUTH EVERY NIGHT AT BEDTIME (Patient taking differently: Take 40  mg by mouth at bedtime. TAKE 1 TABLET BY MOUTH DAILY AND 1/2 TABLET BY MOUTH EVERY NIGHT AT BEDTIME) 45 tablet 0  . B-D UF III MINI PEN NEEDLES 31G X 5 MM MISC USE FIVE PER DAY AS DIRECTED (Patient taking differently: 1 each by Other route 5 (five) times daily. USE FIVE PER DAY AS DIRECTED) 200 each 0  . diazepam (VALIUM) 5 MG tablet Take 1 tablet (5 mg total) by mouth every 6 (six) hours as needed for muscle spasms. 40 tablet 0  . Dulaglutide (TRULICITY) 0.75 MG/0.5ML SOPN INJECT 0.75 MG INTO THE SKIN ONCE A WEEK (Patient taking differently: Inject 0.75 mg into the skin every Thursday. INJECT 0.75 MG INTO THE SKIN ONCE A WEEK) 4 pen 3  . glucose blood (ONE TOUCH ULTRA TEST) test strip USE TO TEST BLOOD SUGAR THREE TIMES DAILY AS DIRECTED 200 each 2  . HYDROcodone-acetaminophen (NORCO/VICODIN) 5-325 MG tablet Take 1-2 tablets by mouth every 4 (four) hours as needed for severe pain ((score 7 to 10)). 60 tablet 0  . Insulin Disposable Pump (OMNIPOD 5 PACK) MISC 1 each by Does not apply route daily. Apply 1 OmniPod to body one time every 3 days. (Patient taking differently: 1 each by Does not apply route daily. Apply 1 OmniPod to body one time every 3 days. With humalog 100 unit/ml--Bolus 12 units in the morning, 14 units in the afternoon, & 18 units at night) 5 each 3  . insulin lispro (HUMALOG) 100 UNIT/ML injection Inject 0.9 mLs (90 Units total) into the skin daily. (Patient taking differently: Inject 0-90 Units into the skin daily. With Insulin Pump--Bolus 12 units in the morning, 14 units in the afternoon, & 18 units at night) 40 mL 5  . Insulin Pen Needle 32G X 4 MM MISC Use 5 pens per day to inject insulin 200 each 3  . Insulin Syringe-Needle U-100 (INSULIN SYRINGE .5CC/30GX5/16") 30G X 5/16" 0.5 ML MISC Use 3 times a day for insulin 100 each 1  . INVOKAMET XR 50-1000 MG TB24 TAKE 2 TABLETS BY MOUTH EVERY DAY (Patient taking differently: Take 1 tablet by mouth 2 (two) times daily. Morning &  Afternoon.) 60 tablet 0  . losartan (COZAAR) 50 MG tablet TAKE 1 TABLET(50 MG) BY MOUTH DAILY (Patient taking differently: Take 50 mg by mouth at bedtime. ) 90 tablet 0  . meloxicam (MOBIC) 7.5 MG tablet Take 1-2 tablets (7.5-15 mg total) by mouth daily. 90 tablet 0  . NON FORMULARY Take 2 capsules by mouth 2 (two) times daily. 4LIFE TRANSFER FACTORY CARDIO    . OVER THE COUNTER MEDICATION  Take 2 tablets by mouth 2 (two) times daily. 4LIFE TRANSFER FACTORY RECALL    . traMADol (ULTRAM) 50 MG tablet Take 1 tablet (50 mg total) by mouth every 6 (six) hours as needed. 20 tablet 0   No current facility-administered medications on file prior to visit.     BP 132/70   Pulse 65   Temp 98 F (36.7 C) (Oral)   Resp 16   Ht 5\' 4"  (1.626 m)   Wt 202 lb 3.2 oz (91.7 kg)   SpO2 99%   BMI 34.71 kg/m       Objective:   Physical Exam  General- No acute distress. Pleasant patient. Neck- Full range of motion, no jvd Lungs- Clear, even and unlabored. Heart- regular rate and rhythm. Neurologic- CNII- XII grossly intact.  Abdomen- soft, mildly distended, decreased bowel sounds.  No rebound or guarding no organomegaly. Back- patient has midline vertical scar over lumbar area.  Scar looks about 8-9 cm in length.  Faint minimal pinkish redness around the edges.  Laceration line looks like is healing well.  A little bit of serosanguineous drainage at the bottom third portion of the laceration line.      Assessment & Plan:  Your surgical site/laceration overall looks like it is healing well.  Little bit of pinkish/redness to the edges of wound.  Some slight serosanguineous drainage present.  We will go ahead and get wound culture and will go ahead and place you on 5 days of doxycycline pending culture results.  I think this is a good idea as we approached the weekend.  Also you report of 4 days with no bowel movement post surgery.  Also using Norco every 4 hours for pain.  Being post surgery and taking  narcotics likely cause for the constipation.  We gave you Toradol 60 mg IM with the hopes that you will not need narcotics for the next 24 hours.  I do want you to stay well-hydrated and try MiraLAX over-the-counter to see if this helps with a bowel movement.  Hopefully if you do have bowel movement then you could resume the narcotic/Norco 1 every 4-6 hours(but only for severe pain).  Starting tomorrow around noon you could also resume your meloxicam.  Will get x-ray today to assess degree of constipation.  And see if they make any comments regarding possible obstruction.  If any severe abdomen bloating, cramping, pain, nausea or vomiting over the weekend then advised to be seen in the emergency department.  Follow-up with your surgeon next week as scheduled.  With me as needed or with me as needed if follow-up appoint with surgeon is delayed.  Esperanza Richters, PA-C

## 2018-07-28 NOTE — Addendum Note (Signed)
Addended by: Orlene OchRENCE, Lilyian Quayle N on: 07/28/2018 01:25 PM   Modules accepted: Orders

## 2018-07-31 ENCOUNTER — Inpatient Hospital Stay: Payer: Medicare Other | Admitting: Medical

## 2018-07-31 LAB — WOUND CULTURE
MICRO NUMBER: 91132461
RESULT:: NO GROWTH
SPECIMEN QUALITY: ADEQUATE

## 2018-08-03 ENCOUNTER — Inpatient Hospital Stay: Payer: Medicare Other | Admitting: Medical

## 2018-08-09 ENCOUNTER — Other Ambulatory Visit: Payer: Self-pay | Admitting: Endocrinology

## 2018-08-14 ENCOUNTER — Telehealth: Payer: Self-pay | Admitting: Endocrinology

## 2018-08-14 NOTE — Telephone Encounter (Signed)
error 

## 2018-08-17 ENCOUNTER — Other Ambulatory Visit: Payer: Self-pay | Admitting: Endocrinology

## 2018-08-21 ENCOUNTER — Encounter: Payer: Self-pay | Admitting: *Deleted

## 2018-08-25 ENCOUNTER — Telehealth: Payer: Self-pay | Admitting: Endocrinology

## 2018-08-25 NOTE — Telephone Encounter (Signed)
[facility] called stated they requested information on patient and received fax today but the CMN is blank. Please Advise. Fax: [phone]

## 2018-08-28 NOTE — Telephone Encounter (Signed)
Document--sending to get the signature by the Dr.

## 2018-09-08 ENCOUNTER — Other Ambulatory Visit (INDEPENDENT_AMBULATORY_CARE_PROVIDER_SITE_OTHER): Payer: Medicare Other

## 2018-09-08 DIAGNOSIS — Z794 Long term (current) use of insulin: Secondary | ICD-10-CM | POA: Diagnosis not present

## 2018-09-08 DIAGNOSIS — E1165 Type 2 diabetes mellitus with hyperglycemia: Secondary | ICD-10-CM

## 2018-09-08 LAB — BASIC METABOLIC PANEL
BUN: 17 mg/dL (ref 6–23)
CHLORIDE: 103 meq/L (ref 96–112)
CO2: 29 mEq/L (ref 19–32)
Calcium: 9.6 mg/dL (ref 8.4–10.5)
Creatinine, Ser: 0.82 mg/dL (ref 0.40–1.50)
GFR: 99.55 mL/min (ref >60.00)
Glucose, Bld: 119 mg/dL — ABNORMAL HIGH (ref 70–99)
POTASSIUM: 4.3 meq/L (ref 3.5–5.1)
SODIUM: 142 meq/L (ref 135–145)

## 2018-09-08 LAB — HEMOGLOBIN A1C: HEMOGLOBIN A1C: 7 % — AB (ref 4.6–6.5)

## 2018-09-13 ENCOUNTER — Ambulatory Visit: Payer: Medicare Other | Admitting: Endocrinology

## 2018-09-13 ENCOUNTER — Encounter: Payer: Self-pay | Admitting: Endocrinology

## 2018-09-13 VITALS — BP 132/78 | HR 88 | Ht 63.0 in | Wt 208.0 lb

## 2018-09-13 DIAGNOSIS — E1165 Type 2 diabetes mellitus with hyperglycemia: Secondary | ICD-10-CM

## 2018-09-13 DIAGNOSIS — Z794 Long term (current) use of insulin: Secondary | ICD-10-CM

## 2018-09-13 DIAGNOSIS — Z23 Encounter for immunization: Secondary | ICD-10-CM | POA: Diagnosis not present

## 2018-09-13 DIAGNOSIS — I10 Essential (primary) hypertension: Secondary | ICD-10-CM

## 2018-09-13 NOTE — Progress Notes (Signed)
Patient ID: Samuel [name], male   DOB: September 13, 1951, 67 y.o.   MRN: [id number]            Reason for Appointment:  Followup for Type 2 Diabetes  Referring physician: Saguier  History of Present Illness:          Diagnosis: Type 2 diabetes mellitus, date of diagnosis:  1990      Past history: He thinks he has been on insulin for the last 10-12 years. Previously had been on metformin which has been continued. He was probably tried on different insulin regimens initially but has been taking 70/30 mostly because of cost Previous records are not available and appears that his sugars have been poorly controlled for several years He was  referred here because of an A1c in 8/15 of 9.7%. He was on a regimen of Novolin 70/30 twice a day but because of inadequate control and higher readings later in the day he was switched to basal bolus insulin regimen in 12/15. Previous insulin regimen: Humalog  ac meals 35-40-30 Toujeo 35-40 units daily  Recent history:   INSULIN regimen is: OMNIPOD INSULIN PUMP  Basal rate settings: Midnight = 1.3, 6am: 1.5, 5 PM = 1.6.  Boluses 12 units breakfast -14 units lunch and 18 units at dinner at meals: Carbohydrate coverage 1:10 and correction 1:50   Non-insulin hypoglycemic drugs: metformin 1 g twice a day, Trulicity 1.5 mg weekly,Invokamet 50/1000  A1c has been below 8% and now at the best level of 7 compared to 7.9   Current management, blood sugar patterns and problems identified:  His pump was programmed about 6 hours slow and this was likely because he took out the battery from his pump sometime ago when it was beeping excessively  He did not know how to change the time on his pump and was not aware of the need for keeping the correct time on the pump  Overall blood sugars are much better  His FASTING blood sugars are near normal even with his getting relatively higher basal rate overnight with the time change on his pump  Again he is checking his  sugars only BEFORE meals and usually not after  Has occasionally gone up on his bolus dose by 2 units but not consistently  Has occasional readings over 200 but not frequently  No hypoglycemia and likely has a falsely low reading of 22 in the evening last month  With his not being as active after his back surgery he has gained a little weight        Side effects from medications have been: ?  Nausea/dizziness on Victoza  Compliance with the medical regimen: Fair   Glucose monitoring:  done 2-3 times a day         Glucometer:  Freestyle/Omnipod      Blood Glucose readings by download for the last 30 days show the following   PRE-MEAL Fasting Lunch Dinner ?  Bedtime Overall  Glucose range: 104-130  98-150  136-200  141-214   Mean/median:  114  120  133  165 130     Self-care:  Variable portions, may have unbalanced meals  Meals: 3 meals per day. Breakfast at 8-9 am is various kinds of bread, milk and sometimes eggs. Lunch generally larger meal  Dinner at 6 pm     Dietician visit, most recent: 5/16 Last CDE visit: 12/2016            Weight history: Previous range 200-220  Wt Readings from Last 3 Encounters:  09/13/18 208 lb (94.3 kg)  07/28/18 202 lb 3.2 oz (91.7 kg)  07/24/18 210 lb (95.3 kg)    Glycemic control:   Lab Results  Component Value Date   HGBA1C 7.0 (H) 09/08/2018   HGBA1C 7.9 (H) 07/19/2018   HGBA1C 7.9 (H) 05/22/2018   Lab Results  Component Value Date   MICROALBUR <0.7 05/22/2018   LDLCALC 20 05/26/2018   CREATININE 0.82 09/08/2018    Lab Results  Component Value Date   FRUCTOSAMINE 302 (H) 07/20/2017       Allergies as of 09/13/2018   No Known Allergies     Medication List        Accurate as of 09/13/18  1:53 PM. Always use your most recent med list.          aspirin EC 81 MG tablet Take 81 mg by mouth at bedtime.   atorvastatin 40 MG tablet Commonly known as:  LIPITOR TAKE 1 TABLET BY MOUTH DAILY AND 1/2 TABLET BY MOUTH  EVERY NIGHT AT BEDTIME   diazepam 5 MG tablet Commonly known as:  VALIUM Take 1 tablet (5 mg total) by mouth every 6 (six) hours as needed for muscle spasms.   doxycycline 100 MG tablet Commonly known as:  VIBRA-TABS Take 1 tablet (100 mg total) by mouth 2 (two) times daily. Can give caps or generic   glucose blood test strip USE TO TEST BLOOD SUGAR THREE TIMES DAILY AS DIRECTED   HYDROcodone-acetaminophen 5-325 MG tablet Commonly known as:  NORCO/VICODIN Take 1-2 tablets by mouth every 4 (four) hours as needed for severe pain ((score 7 to 10)).   insulin lispro 100 UNIT/ML injection Commonly known as:  HUMALOG Inject 0.9 mLs (90 Units total) into the skin daily.   Insulin Pen Needle 32G X 4 MM Misc Use 5 pens per day to inject insulin   B-D UF III MINI PEN NEEDLES 31G X 5 MM Misc Generic drug:  Insulin Pen Needle USE FIVE PER DAY AS DIRECTED   INSULIN SYRINGE .5CC/30GX5/16" 30G X 5/16" 0.5 ML Misc Use 3 times a day for insulin   INVOKAMET XR 50-1000 MG Tb24 Generic drug:  Canagliflozin-metFORMIN HCl ER TAKE 2 TABLETS BY MOUTH EVERY DAY   losartan 50 MG tablet Commonly known as:  COZAAR TAKE 1 TABLET(50 MG) BY MOUTH DAILY   meloxicam 7.5 MG tablet Commonly known as:  MOBIC Take 1-2 tablets (7.5-15 mg total) by mouth daily.   NON FORMULARY Take 2 capsules by mouth 2 (two) times daily. 4LIFE TRANSFER FACTORY CARDIO   OMNIPOD 5 PACK Misc 1 each by Does not apply route daily. Apply 1 OmniPod to body one time every 3 days.   OVER THE COUNTER MEDICATION Take 2 tablets by mouth 2 (two) times daily. 4LIFE TRANSFER FACTORY RECALL   traMADol 50 MG tablet Commonly known as:  ULTRAM Take 1 tablet (50 mg total) by mouth every 6 (six) hours as needed.   TRULICITY 0.75 MG/0.5ML Sopn Generic drug:  Dulaglutide INJECT 0.75MG (0.5ML) INTO THE SKIN ONCE A WEEK       Allergies: No Known Allergies  Past Medical History:  Diagnosis Date  . Blood transfusion without  reported diagnosis   . Diabetes mellitus without complication (HCC)    type II  . History of ETOH abuse   . Hypertension     Past Surgical History:  Procedure Laterality Date  . BACK SURGERY N/A 2010    Family History  Problem Relation Age of Onset  . Diabetes Unknown   . Hypertension Unknown   . Arthritis Father   . Diabetes Father     Social History:  reports that he has quit smoking. He quit smokeless tobacco use about 24 years ago. He reports that he does not drink alcohol or use drugs.    Review of Systems         Lipids:  He has been on Lipitor 40 mg for hypercholesterolemia since about 2011, lipid levels shows good control  Lipids followed by PCP        Lab Results  Component Value Date   CHOL 86 05/26/2018   HDL 38.00 (L) 05/26/2018   LDLCALC 20 05/26/2018   LDLDIRECT 40.0 02/13/2016   TRIG 138.0 05/26/2018   CHOLHDL 2 05/26/2018    Hypertension has been treated with losartan 50 mg, prescribed by PCP    Physical Examination:  BP 132/78   Pulse 88   Ht 5\' 3"  (1.6 m)   Wt 208 lb (94.3 kg)   SpO2 98%   BMI 36.85 kg/m      ASSESSMENT:  Diabetes type 2, with obesity and BMI of 36  See history of present illness for  description of current diabetes management, blood sugar patterns and problems identified..  His diabetes is being managed with the Omnipod insulin pump and also taking Trulicity   His A1c is significantly better at 7%  Improvement in his blood sugar may be related to less chronic pain although he has gained a little weight and is not as active With his pump being programmed to the wrong time not clear if he does need any adjustment of his basal rates since he is getting higher basal rates overnight with the new setting which is 6 hours behind However no hypoglycemia surprisingly He says he is trying to moderate his portions also but not checking enough readings after meals  Discussed day-to-day management of his diabetes and pump  management He will need to check more readings after meals His pump was programmed to the right time  HYPERTENSION: Controlled  Influenza vaccine given  PLAN:      Start regular walking for exercise  No change in basal rates  However advised him to call if blood sugars are starting to be higher on waking up or any other time  More readings to be done after meals  He will increase his BOLUS doses by 2 to 4 units if eating a larger meal or more carbohydrate especially at restaurants  Discussed rotation of sites where he uses the pump to make sure he is replacing the insulin pod when indicated  Continue Trulicity same dose  Instructions were reviewed through interpreter today Counseling time on subjects discussed in assessment and plan sections is over 50% of today's 25 minute visit     There are no Patient Instructions on file for this visit.   Reather Littler 09/13/2018, 1:53 PM   Note: This office note was prepared with Dragon voice recognition system technology. Any transcriptional errors that result from this process are unintentional.

## 2018-09-25 ENCOUNTER — Encounter: Payer: Self-pay | Admitting: Medical

## 2018-09-25 ENCOUNTER — Ambulatory Visit (HOSPITAL_BASED_OUTPATIENT_CLINIC_OR_DEPARTMENT_OTHER)
Admission: RE | Admit: 2018-09-25 | Discharge: 2018-09-25 | Disposition: A | Discharge status: Home / Self Care | Payer: Medicare Other | Source: Ambulatory Visit | Attending: Medical | Admitting: Medical

## 2018-09-25 ENCOUNTER — Ambulatory Visit (INDEPENDENT_AMBULATORY_CARE_PROVIDER_SITE_OTHER): Payer: Medicare Other | Admitting: Medical

## 2018-09-25 VITALS — BP 122/67 | HR 67 | Temp 98.2°F | Resp 16 | Ht 63.0 in | Wt 206.2 lb

## 2018-09-25 DIAGNOSIS — M5030 Other cervical disc degeneration, unspecified cervical region: Secondary | ICD-10-CM | POA: Diagnosis not present

## 2018-09-25 DIAGNOSIS — M542 Cervicalgia: Secondary | ICD-10-CM | POA: Diagnosis not present

## 2018-09-25 DIAGNOSIS — R251 Tremor, unspecified: Secondary | ICD-10-CM

## 2018-09-25 DIAGNOSIS — M25511 Pain in right shoulder: Secondary | ICD-10-CM

## 2018-09-25 DIAGNOSIS — M25512 Pain in left shoulder: Secondary | ICD-10-CM | POA: Diagnosis not present

## 2018-09-25 NOTE — Patient Instructions (Signed)
For your recent and neck pain and bilateral shoulder pain, will get x-rays of these regions.  Would recommend that you get back on the meloxicam and use 1 to 2 tablets daily if needed.  Start with just 1 tablet and see if this is adequate.  We discussed today possibly prescribing narcotic due to high level pain but presently decided against this.  Will evaluate x-rays and decide on next step.  Considering possible referral to sports medicine as on exam you do seem to have some tenderness in the bicep tendon regions.  You do not have any obvious upper extremity tremor presently on exam.  This does occur periodically.  Please update us if this is not returning and becoming more frequent.  Or if you have other associated signs symptoms as discussed.  In that event might refer you to a neurologist.  Follow-up date to be determined after x-ray review.

## 2018-09-25 NOTE — Progress Notes (Signed)
Subjective:    Patient ID: Samuel [name], male    DOB: 12-18-1950, 67 y.o.   MRN: [id number]  HPI  Pt in reporting that he has been having some neck pain and some bilateral shoulder pain. Pain in neck and shoulder areas are associated. Pt states pain worse on rt side. Pain in these area present for one month. He states he feels a lot of tension/tightness in shoulder. He states some pain when he lifts shoulders  above his head. Pt has tried tylenol but not helping him much.   Review of Systems  Constitutional: Negative for chills, fatigue and fever.  Respiratory: Negative for cough, chest tightness, shortness of breath and wheezing.   Cardiovascular: Negative for chest pain and palpitations.  Gastrointestinal: Negative for abdominal pain.  Musculoskeletal: Negative for back pain.       See hpi on neck and shoulders.  Skin: Negative for rash.  Neurological: Negative for dizziness, speech difficulty and light-headedness.       Occasional has day of severe shaking of his rt hand. Describes severe tremor. He states this is rare.  He states sometimes tremor worse with emotions.   Hematological: Negative for adenopathy. Does not bruise/bleed easily.  Psychiatric/Behavioral: Negative for behavioral problems, confusion and sleep disturbance. The patient is not hyperactive.    Past Medical History:  Diagnosis Date  . Blood transfusion without reported diagnosis   . Diabetes mellitus without complication (HCC)    type II  . History of ETOH abuse   . Hypertension      Social History   Socioeconomic History  . Marital status: Married    Spouse name: Not on file  . Number of children: Not on file  . Years of education: Not on file  . Highest education level: Not on file  Occupational History  . Not on file  Social Needs  . Financial resource strain: Not on file  . Food insecurity:    Worry: Not on file    Inability: Not on file  . Transportation needs:    Medical: Not on file    Non-medical: Not on file  Tobacco Use  . Smoking status: Former Games developer  . Smokeless tobacco: Former Neurosurgeon    Quit date: 11/08/1993  Substance and Sexual Activity  . Alcohol use: No    Alcohol/week: 0.0 standard drinks    Comment: occasional  . Drug use: No  . Sexual activity: Not on file  Lifestyle  . Physical activity:    Days per week: Not on file    Minutes per session: Not on file  . Stress: Not on file  Relationships  . Social connections:    Talks on phone: Not on file    Gets together: Not on file    Attends religious service: Not on file    Active member of club or organization: Not on file    Attends meetings of clubs or organizations: Not on file    Relationship status: Not on file  . Intimate partner violence:    Fear of current or ex partner: Not on file    Emotionally abused: Not on file    Physically abused: Not on file    Forced sexual activity: Not on file  Other Topics Concern  . Not on file  Social History Narrative  . Not on file    Past Surgical History:  Procedure Laterality Date  . BACK SURGERY N/A 2010    Family History  Problem Relation Age  of Onset  . Diabetes Unknown   . Hypertension Unknown   . Arthritis Father   . Diabetes Father     No Known Allergies  Current Outpatient Medications on File Prior to Visit  Medication Sig Dispense Refill  . aspirin EC 81 MG tablet Take 81 mg by mouth at bedtime.    [name]. atorvastatin (LIPITOR) 40 MG tablet TAKE 1 TABLET BY MOUTH DAILY AND 1/2 TABLET BY MOUTH EVERY NIGHT AT BEDTIME (Patient taking differently: Take 40 mg by mouth at bedtime. TAKE 1 TABLET BY MOUTH DAILY AND 1/2 TABLET BY MOUTH EVERY NIGHT AT BEDTIME) 45 tablet 0  . B-D UF III MINI PEN NEEDLES 31G X 5 MM MISC USE FIVE PER DAY AS DIRECTED (Patient taking differently: 1 each by Other route 5 (five) times daily. USE FIVE PER DAY AS DIRECTED) 200 each 0  . diazepam (VALIUM) 5 MG tablet Take 1 tablet (5 mg total) by mouth every 6 (six) hours as  needed for muscle spasms. 40 tablet 0  . doxycycline (VIBRA-TABS) 100 MG tablet Take 1 tablet (100 mg total) by mouth 2 (two) times daily. Can give caps or generic 10 tablet 0  . glucose blood (ONE TOUCH ULTRA TEST) test strip USE TO TEST BLOOD SUGAR THREE TIMES DAILY AS DIRECTED 200 each 2  . Insulin Disposable Pump (OMNIPOD 5 PACK) MISC 1 each by Does not apply route daily. Apply 1 OmniPod to body one time every 3 days. (Patient taking differently: 1 each by Does not apply route daily. Apply 1 OmniPod to body one time every 3 days. With humalog 100 unit/ml--Bolus 12 units in the morning, 14 units in the afternoon, & 18 units at night) 5 each 3  . insulin lispro (HUMALOG) 100 UNIT/ML injection Inject 0.9 mLs (90 Units total) into the skin daily. (Patient taking differently: Inject 0-90 Units into the skin daily. With Insulin Pump--Bolus 12 units in the morning, 14 units in the afternoon, & 18 units at night) 40 mL 5  . Insulin Pen Needle 32G X 4 MM MISC Use 5 pens per day to inject insulin 200 each 3  . Insulin Syringe-Needle U-100 (INSULIN SYRINGE .5CC/30GX5/16") 30G X 5/16" 0.5 ML MISC Use 3 times a day for insulin 100 each 1  . INVOKAMET XR 50-1000 MG TB24 TAKE 2 TABLETS BY MOUTH EVERY DAY 60 tablet 0  . losartan (COZAAR) 50 MG tablet TAKE 1 TABLET(50 MG) BY MOUTH DAILY (Patient taking differently: Take 50 mg by mouth at bedtime. ) 90 tablet 0  . meloxicam (MOBIC) 7.5 MG tablet Take 1-2 tablets (7.5-15 mg total) by mouth daily. 90 tablet 0  . NON FORMULARY Take 2 capsules by mouth 2 (two) times daily. 4LIFE TRANSFER FACTORY CARDIO    . OVER THE COUNTER MEDICATION Take 2 tablets by mouth 2 (two) times daily. 4LIFE TRANSFER FACTORY RECALL    . TRULICITY 0.75 MG/0.5ML SOPN INJECT 0.75MG (0.5ML) INTO THE SKIN ONCE A WEEK 4 mL 2   No current facility-administered medications on file prior to visit.     BP 122/67   Pulse 67   Temp 98.2 F (36.8 C) (Oral)   Resp 16   Ht 5\' 3"  (1.6 m)   Wt 206 lb  3.2 oz (93.5 kg)   SpO2 96%   BMI 36.53 kg/m        Objective:   Physical Exam  General Mental Status- Alert. General Appearance- Not in acute distress.   Skin General: Color- Normal Color.  Moisture- Normal Moisture.  Neck Carotid Arteries- Normal color. Moisture- Normal Moisture. No carotid bruits. No JVD.  Chest and Lung Exam Auscultation: Breath Sounds:-Normal.  Cardiovascular Auscultation:Rythm- Regular. Murmurs & Other Heart Sounds:Auscultation of the heart reveals- No Murmurs.  Abdomen Inspection:-Inspeection Normal. Palpation/Percussion:Note:No mass. Palpation and Percussion of the abdomen reveal- Non Tender, Non Distended + BS, no rebound or guarding.    Neurologic Cranial Nerve exam:- CN III-XII intact(No nystagmus), symmetric smile. Drift Test:- No drift.  Finger to Nose:- Normal/Intact Strength:- 5/5 equal and symmetric strength both upper and lower extremities.  Shoulders- good range of motion. But tender over bicep tendon areas.  Arms- good flexion and extension with no rigidity of movement.   Hands- no tremor noted presently.      Assessment & Plan:  For your recent and neck pain and bilateral shoulder pain, will get x-rays of these regions.  Would recommend that you get back on the meloxicam and use 1 to 2 tablets daily if needed.  Start with just 1 tablet and see if this is adequate.  We discussed today possibly prescribing narcotic due to high level pain but presently decided against this.  Will evaluate x-rays and decide on next step.  Considering possible referral to sports medicine as on exam you do seem to have some tenderness in the bicep tendon regions.  You do not have any obvious upper extremity tremor presently on exam.  This does occur periodically.  Please update [place] if this is not returning and becoming more frequent.  Or if you have other associated signs symptoms as discussed.  In that event might refer you to a  neurologist.  Follow-up date to be determined after x-ray review.  Esperanza Richters, PA-C

## 2018-10-04 ENCOUNTER — Telehealth: Payer: Self-pay | Admitting: *Deleted

## 2018-10-04 DIAGNOSIS — M5030 Other cervical disc degeneration, unspecified cervical region: Secondary | ICD-10-CM

## 2018-10-04 DIAGNOSIS — M25511 Pain in right shoulder: Secondary | ICD-10-CM

## 2018-10-04 DIAGNOSIS — M25512 Pain in left shoulder: Secondary | ICD-10-CM

## 2018-10-04 NOTE — Telephone Encounter (Signed)
DG Cervical Spine Complete (Accession [id number]) (Order [id number])  Imaging  Date: 09/25/2018 Department: [facility] Released By: Eugene Garnet Authorizing: SaguierKateri Mc  Exam Information   Status Exam Begun  Exam Ended   Final [99] 09/25/2018 10:48 AM 09/25/2018 11:05 AM  PACS Images   Show images for DG Cervical Spine Complete  Study Result   CLINICAL DATA:  Posterior neck pain radiating to both shoulders for months RIGHT greater than LEFT, more pain when sleeping, history diabetes mellitus, hypertension  EXAM: CERVICAL SPINE - COMPLETE 4+ VIEW  COMPARISON:  None  FINDINGS: Osseous mineralization low normal.  Prevertebral soft tissues normal thickness.  Multilevel disc space narrowing and endplate spur formation.  Mild scattered facet degenerative changes.  Vertebral body heights maintained without fracture or subluxation.  Small uncovertebral spurs encroach upon the at C4-C5 and C5-C6 neural foramina bilaterally.  C1-C2 alignment normal.  IMPRESSION: Degenerative disc and facet disease changes of the cervical spine.  No acute abnormalities.   Electronically Signed   By: Ulyses Southward M.D.   On: 09/25/2018 11:15   Result History   DG Cervical Spine Complete (Order #[id number]) on 09/25/2018 - Order Result History Report  Result Notes for DG Cervical Spine Complete   Notes recorded by Wilford Corner, CMA on 09/26/2018 at 12:57 PM EST Results given to patient (in spanish) ------  Notes recorded by Wilford Corner, CMA on 09/26/2018 at 12:57 PM EST Results given to patient (in spanish) advised he will be referred to sports medicine. ------  Notes recorded by Esperanza Richters, PA-C on 09/25/2018 at 7:44 PM EST Degenerative disc disease. Spurs at level of c4, c5, and c6. Some narrowing of area where nerves exit spine. Will go ahead and refer to sports medicine for neck pain and shoulder pain.       Encounter-Level Documents - 09/25/2018:   Electronic signature on 09/25/2018 10:43 AM - Signed  Electronic signature on 09/25/2018 10:43 AM - Signed      Order-Level Documents:   There are no order-level documents.  Hospital account-Level Documents:   There are no hospital account-level documents.  Vitals   Height Weight BMI (Calculated)  5\' 3"  (1.6 m) 206 lb 3.2 oz (93.5 kg) 36.54  Protocol Documents   Imaging Protocol  Imaging   Imaging Information  Resulted by:   Signed Date/Time  Phone Pager  Ulyses Southward 09/25/2018 11:15 AM [phone]  Result Notes for DG Cervical Spine Complete   Notes recorded by Wilford Corner, CMA on 09/26/2018 at 12:57 PM EST Results given to patient (in spanish) ------  Notes recorded by Wilford Corner, CMA on 09/26/2018 at 12:57 PM EST Results given to patient (in spanish) advised he will be referred to sports medicine. ------  Notes recorded by Esperanza Richters, PA-C on 09/25/2018 at 7:44 PM EST Degenerative disc disease. Spurs at level of c4, c5, and c6. Some narrowing of area where nerves exit spine. Will go ahead and refer to sports medicine for neck pain and shoulder pain.      Study Notes    Artis Delay on 09/25/2018 11:06 AM  Patient states that he has been having posterior neck pains radiating into both shoulders x months, right shoulder is worse than left, states that his shoulders hurt him more when sleeping, no other complaints    Original Order   Ordered On Ordered By   09/25/2018 10:30 AM Saguier, Ramon Dredge, PA-C  External Result Report   External Result Report

## 2018-10-04 NOTE — Telephone Encounter (Signed)
Referral to Sports Medicine was to be placed after receiving DG Cervical Spine Complete results per provider note. Patient called and was quite upset and adamant that he was to have a referral [was not in the system]. Placed referral to Dr. Pearletha ForgeHudnall with provider's notes in comments. Patient is aware and will call us back if he has not heard anything on the referral status by Tuesday/SLS 11/27

## 2018-10-09 ENCOUNTER — Other Ambulatory Visit: Payer: Self-pay | Admitting: Endocrinology

## 2018-10-21 ENCOUNTER — Other Ambulatory Visit: Payer: Self-pay | Admitting: Medical

## 2018-10-24 ENCOUNTER — Ambulatory Visit (INDEPENDENT_AMBULATORY_CARE_PROVIDER_SITE_OTHER): Payer: Medicare Other | Admitting: Family Medicine

## 2018-10-24 ENCOUNTER — Encounter: Payer: Self-pay | Admitting: Family Medicine

## 2018-10-24 VITALS — BP 138/85 | HR 77 | Ht 63.0 in | Wt 205.0 lb

## 2018-10-24 DIAGNOSIS — M542 Cervicalgia: Secondary | ICD-10-CM | POA: Diagnosis not present

## 2018-10-24 MED ORDER — MELOXICAM 15 MG PO TABS: 15.0000 mg | ORAL_TABLET | Freq: Every day | ORAL | 2 refills | Status: DC

## 2018-10-24 MED ORDER — TIZANIDINE HCL 4 MG PO TABS: 4.0000 mg | ORAL_TABLET | Freq: Three times a day (TID) | ORAL | 1 refills | Status: DC | PRN
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
## 2018-10-24 NOTE — Patient Instructions (Addendum)
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 Start physical therapy and do home exercises on days you don't go to therapy. Restart the meloxicam daily with food. Take tizanidine as needed for muscle spasms. Heat as needed 15 minutes at a time 3-4 times a day. Follow up with me in 1 month for reevaluation.

## 2018-10-25 ENCOUNTER — Encounter: Payer: Self-pay | Admitting: Family Medicine

## 2018-12-07 ENCOUNTER — Other Ambulatory Visit: Payer: Self-pay | Admitting: Endocrinology

## 2018-12-11 ENCOUNTER — Other Ambulatory Visit: Payer: Self-pay | Admitting: Endocrinology

## 2018-12-12 ENCOUNTER — Other Ambulatory Visit: Payer: Self-pay | Admitting: Endocrinology

## 2018-12-12 DIAGNOSIS — Z794 Long term (current) use of insulin: Principal | ICD-10-CM

## 2018-12-12 DIAGNOSIS — E1165 Type 2 diabetes mellitus with hyperglycemia: Secondary | ICD-10-CM

## 2018-12-15 ENCOUNTER — Other Ambulatory Visit (INDEPENDENT_AMBULATORY_CARE_PROVIDER_SITE_OTHER): Payer: Medicare Other

## 2018-12-15 DIAGNOSIS — Z794 Long term (current) use of insulin: Secondary | ICD-10-CM | POA: Diagnosis not present

## 2018-12-15 DIAGNOSIS — E1165 Type 2 diabetes mellitus with hyperglycemia: Secondary | ICD-10-CM | POA: Diagnosis not present

## 2018-12-15 LAB — BASIC METABOLIC PANEL
BUN: 22 mg/dL (ref 6–23)
CHLORIDE: 104 meq/L (ref 96–112)
CO2: 28 meq/L (ref 19–32)
Calcium: 9.1 mg/dL (ref 8.4–10.5)
Creatinine, Ser: 0.88 mg/dL (ref 0.40–1.50)
GFR: 86.26 mL/min (ref >60.00)
Glucose, Bld: 117 mg/dL — ABNORMAL HIGH (ref 70–99)
POTASSIUM: 4.7 meq/L (ref 3.5–5.1)
SODIUM: 141 meq/L (ref 135–145)

## 2018-12-15 LAB — HEMOGLOBIN A1C: HEMOGLOBIN A1C: 7.7 % — AB (ref 4.6–6.5)

## 2018-12-20 ENCOUNTER — Ambulatory Visit: Payer: Medicare Other | Admitting: Endocrinology

## 2018-12-20 ENCOUNTER — Encounter: Payer: Self-pay | Admitting: Endocrinology

## 2018-12-20 VITALS — BP 120/74 | HR 69 | Ht 63.0 in | Wt 207.0 lb

## 2018-12-20 DIAGNOSIS — E1165 Type 2 diabetes mellitus with hyperglycemia: Secondary | ICD-10-CM | POA: Diagnosis not present

## 2018-12-20 DIAGNOSIS — Z794 Long term (current) use of insulin: Secondary | ICD-10-CM | POA: Diagnosis not present

## 2018-12-20 DIAGNOSIS — I10 Essential (primary) hypertension: Secondary | ICD-10-CM | POA: Diagnosis not present

## 2018-12-20 NOTE — Progress Notes (Signed)
Patient ID: Samuel [name], male   DOB: September 03, 1951, 68 y.o.   MRN: [id number]            Reason for Appointment:  Followup for Type 2 Diabetes  Referring physician: Saguier  History of Present Illness:          Diagnosis: Type 2 diabetes mellitus, date of diagnosis:  1990      Past history: He thinks he has been on insulin for the last 10-12 years. Previously had been on metformin which has been continued. He was probably tried on different insulin regimens initially but has been taking 70/30 mostly because of cost Previous records are not available and appears that his sugars have been poorly controlled for several years He was  referred here because of an A1c in 8/15 of 9.7%. He was on a regimen of Novolin 70/30 twice a day but because of inadequate control and higher readings later in the day he was switched to basal bolus insulin regimen in 12/15. Previous insulin regimen: Humalog  ac meals 35-40-30 Toujeo 35-40 units daily  Recent history:   INSULIN regimen is: OMNIPOD INSULIN PUMP  Basal rate settings: Midnight = 1.3, 6am: 1.5, 5 PM = 1.6.  Boluses 12 units breakfast -14 units lunch and 18 units at dinner at meals: Carbohydrate coverage 1:10 and correction 1:50   Non-insulin hypoglycemic drugs: metformin 1 g twice a day, Trulicity 1.5 mg weekly,Invokamet 50/1000  A1c has been somewhat variable Although it was 7% is is now up to 7.7   Current management, blood sugar patterns and problems identified:  Although he thinks he is checking his blood sugars consistently with his downloaded appears that he is only checking regularly at breakfast time in the morning  His blood sugars the rest of the day are somewhat variable although not consistently high  Follow-up as high reading may be related to delayed boluses at dinnertime or from variability in his diet  He is still not able to understand that he needs to adjust his insulin based on his meal size or carbohydrate intake;  sometimes he will have increased carbohydrate with food or fried food  He is trying to do at least 1 mile walking daily when he can  Also thinks he is regular with Trulicity  However his weight is up 1 or 2 pounds recently  He now says that he will sometimes forget to take is Invokamet and not clear why  He has no difficulty using his insulin pump and although he has occasional alarms he appears to be changing his pods every 3 days as directed        Side effects from medications have been: ?  Nausea/dizziness on Victoza  Compliance with the medical regimen: Fair   Glucose monitoring:  done 2-3 times a day         Glucometer:  Freestyle/Omnipod      Blood Glucose readings by download    PRE-MEAL Fasting Lunch Dinner Bedtime Overall  Glucose range:  107-150  234  155, 197  128-217   Mean/median:      148   POST-MEAL PC Breakfast PC Lunch PC Dinner  Glucose range:   133  112-235  Mean/median:           Self-care:  Variable portions, may have unbalanced meals  Meals: 3 meals per day. Breakfast at 8-9 am is various kinds of bread, milk and sometimes eggs. Lunch generally larger meal  Dinner at 6 pm  Dietician visit, most recent: 5/16 Last CDE visit: 12/2016            Weight history: Previous range 200-220  Wt Readings from Last 3 Encounters:  12/20/18 207 lb (93.9 kg)  10/24/18 205 lb (93 kg)  09/25/18 206 lb 3.2 oz (93.5 kg)    Glycemic control:   Lab Results  Component Value Date   HGBA1C 7.7 (H) 12/15/2018   HGBA1C 7.0 (H) 09/08/2018   HGBA1C 7.9 (H) 07/19/2018   Lab Results  Component Value Date   MICROALBUR <0.7 05/22/2018   LDLCALC 20 05/26/2018   CREATININE 0.88 12/15/2018    Lab Results  Component Value Date   FRUCTOSAMINE 302 (H) 07/20/2017       Allergies as of 12/20/2018   No Known Allergies     Medication List       Accurate as of December 20, 2018  2:51 PM. Always use your most recent med list.        aspirin EC 81 MG  tablet Take 81 mg by mouth at bedtime.   atorvastatin 40 MG tablet Commonly known as:  LIPITOR TAKE 1 TABLET BY MOUTH DAILY AND 1/2 TABLET BY MOUTH EVERY NIGHT AT BEDTIME   diazepam 5 MG tablet Commonly known as:  VALIUM Take 1 tablet (5 mg total) by mouth every 6 (six) hours as needed for muscle spasms.   doxycycline 100 MG tablet Commonly known as:  VIBRA-TABS Take 1 tablet (100 mg total) by mouth 2 (two) times daily. Can give caps or generic   glucose blood test strip Commonly known as:  ONE TOUCH ULTRA TEST USE TO TEST BLOOD SUGAR THREE TIMES DAILY AS DIRECTED   insulin lispro 100 UNIT/ML injection Commonly known as:  HUMALOG Inject 0.9 mLs (90 Units total) into the skin daily.   Insulin Pen Needle 32G X 4 MM Misc Use 5 pens per day to inject insulin   B-D UF III MINI PEN NEEDLES 31G X 5 MM Misc Generic drug:  Insulin Pen Needle USE FIVE PER DAY AS DIRECTED   INSULIN SYRINGE .5CC/30GX5/16" 30G X 5/16" 0.5 ML Misc Use 3 times a day for insulin   INVOKAMET XR 50-1000 MG Tb24 Generic drug:  Canagliflozin-metFORMIN HCl ER TAKE 2 TABLETS BY MOUTH EVERY DAY   losartan 50 MG tablet Commonly known as:  COZAAR TAKE 1 TABLET(50 MG) BY MOUTH DAILY   meloxicam 15 MG tablet Commonly known as:  MOBIC Take 1 tablet (15 mg total) by mouth daily.   NON FORMULARY Take 2 capsules by mouth 2 (two) times daily. 4LIFE TRANSFER FACTORY CARDIO   OMNIPOD 5 PACK Misc CHANGE EVERY 48 HOURS AS DIRECTED   OVER THE COUNTER MEDICATION Take 2 tablets by mouth 2 (two) times daily. 4LIFE TRANSFER FACTORY RECALL   tiZANidine 4 MG tablet Commonly known as:  ZANAFLEX Take 1 tablet (4 mg total) by mouth every 8 (eight) hours as needed.   TRULICITY 0.75 MG/0.5ML Sopn Generic drug:  Dulaglutide INJECT 0.75MG (0.5ML) INTO THE SKIN ONCE A WEEK       Allergies: No Known Allergies  Past Medical History:  Diagnosis Date  . Blood transfusion without reported diagnosis   . Diabetes mellitus  without complication (HCC)    type II  . History of ETOH abuse   . Hypertension     Past Surgical History:  Procedure Laterality Date  . BACK SURGERY N/A 2010    Family History  Problem Relation Age of Onset  .  Diabetes Unknown   . Hypertension Unknown   . Arthritis Father   . Diabetes Father     Social History:  reports that he has quit smoking. He quit smokeless tobacco use about 25 years ago. He reports that he does not drink alcohol or use drugs.    Review of Systems         Lipids:  He has been on Lipitor 40 mg for hypercholesterolemia since about 2011, lipid levels shows good control  Lipids followed by PCP        Lab Results  Component Value Date   CHOL 86 05/26/2018   HDL 38.00 (L) 05/26/2018   LDLCALC 20 05/26/2018   LDLDIRECT 40.0 02/13/2016   TRIG 138.0 05/26/2018   CHOLHDL 2 05/26/2018    Hypertension has been treated with losartan 50 mg, prescribed by PCP    Physical Examination:  BP 120/74 (BP Location: Left Arm, Patient Position: Sitting, Cuff Size: Normal)   Pulse 69   Ht 5\' 3"  (1.6 m)   Wt 207 lb (93.9 kg)   SpO2 95%   BMI 36.67 kg/m      ASSESSMENT:  Diabetes type 2, with obesity and BMI of 36  See history of present illness for  description of current diabetes management, blood sugar patterns and problems identified..  His diabetes is being managed with the Omnipod insulin pump and also taking Trulicity   His A1c is going up again and now 7.7, previously was better at 7%  He is not checking his blood sugars consistently at lunch and dinner With variability in his diet he is not adjusting his mealtime bolus Also he is not understanding that he needs to have a blood sugar at the time of meals to enable correction also Also is regular with his Invokamet Currently no hypoglycemia Fasting readings are relatively good and most likely does not need a change in his basal rate since there is no consistent pattern Discussed day-to-day  management of his insulin at bolus time, blood sugar monitoring, pump management and medication  He will try to take his Invokamet before breakfast but if he misses the dose he can take it later in the day   HYPERTENSION: Controlled No change in renal function with continuing Invokamet    PLAN:      As above  We will try to check his blood sugars very consistently with every meal and some 2 hours after meals  He can increase the bolus by 2 to 3 units manually if he is getting more carbohydrate or higher fat meal  Discussed blood sugar targets fasting and after meals  Instructions were reviewed through interpreter today  Counseling time on subjects discussed in assessment and plan sections is over 50% of today's 25 minute visit     Patient Instructions  Check sugar at each meal      Samuel [name] 12/20/2018, 2:51 PM   Note: This office note was prepared with Dragon voice recognition system technology. Any transcriptional errors that result from this process are unintentional.

## 2018-12-20 NOTE — Patient Instructions (Signed)
Check sugar at each meal

## 2018-12-27 ENCOUNTER — Telehealth: Payer: Self-pay | Admitting: Medical

## 2018-12-27 NOTE — Telephone Encounter (Signed)
Copied from CRM [id number]. Topic: Appointment Scheduling - Scheduling Inquiry for Clinic >> Dec 25, 2018  3:34 PM Terisa Starr wrote: Reason for CRM: pt would like to know could he have his labs done at the office for Dr Lucianne Muss one week before May 15th when he see Dr Lucianne Muss. He said Dr Lucianne Muss is too far for him   Informed pt that ok to have labs done in our office. Schedule pt on Mar 16, 2019.

## 2019-01-24 ENCOUNTER — Other Ambulatory Visit: Payer: Self-pay | Admitting: Endocrinology

## 2019-01-24 ENCOUNTER — Other Ambulatory Visit: Payer: Self-pay | Admitting: Medical

## 2019-01-30 ENCOUNTER — Other Ambulatory Visit: Payer: Self-pay | Admitting: Endocrinology

## 2019-02-02 ENCOUNTER — Other Ambulatory Visit: Payer: Self-pay | Admitting: Endocrinology

## 2019-03-16 ENCOUNTER — Other Ambulatory Visit (INDEPENDENT_AMBULATORY_CARE_PROVIDER_SITE_OTHER): Payer: Medicare Other

## 2019-03-16 ENCOUNTER — Other Ambulatory Visit: Payer: Self-pay

## 2019-03-16 ENCOUNTER — Other Ambulatory Visit: Payer: Medicare Other

## 2019-03-16 DIAGNOSIS — Z794 Long term (current) use of insulin: Secondary | ICD-10-CM

## 2019-03-16 DIAGNOSIS — E1165 Type 2 diabetes mellitus with hyperglycemia: Secondary | ICD-10-CM

## 2019-03-16 LAB — BASIC METABOLIC PANEL
BUN: 19 mg/dL (ref 6–23)
CO2: 30 mEq/L (ref 19–32)
Calcium: 9 mg/dL (ref 8.4–10.5)
Chloride: 102 mEq/L (ref 96–112)
Creatinine, Ser: 0.7 mg/dL (ref 0.40–1.50)
GFR: 112.25 mL/min (ref >60.00)
Glucose, Bld: 117 mg/dL — ABNORMAL HIGH (ref 70–99)
Potassium: 4.3 mEq/L (ref 3.5–5.1)
Sodium: 139 mEq/L (ref 135–145)

## 2019-03-16 LAB — MICROALBUMIN / CREATININE URINE RATIO
Creatinine,U: 67.1 mg/dL
Microalb Creat Ratio: 1 mg/g (ref 0.0–30.0)
Microalb, Ur: <0.7 mg/dL (ref 0.0–1.9)

## 2019-03-16 LAB — HEMOGLOBIN A1C: Hgb A1c MFr Bld: 7.3 % — ABNORMAL HIGH (ref 4.6–6.5)

## 2019-03-17 ENCOUNTER — Other Ambulatory Visit: Payer: Self-pay | Admitting: Endocrinology

## 2019-03-21 ENCOUNTER — Other Ambulatory Visit: Payer: Self-pay

## 2019-03-23 ENCOUNTER — Encounter: Payer: Self-pay | Admitting: Endocrinology

## 2019-03-23 ENCOUNTER — Other Ambulatory Visit: Payer: Self-pay

## 2019-03-23 ENCOUNTER — Ambulatory Visit: Payer: Medicare Other | Admitting: Endocrinology

## 2019-03-23 VITALS — BP 116/60 | HR 75 | Ht 63.0 in | Wt 207.0 lb

## 2019-03-23 DIAGNOSIS — E1165 Type 2 diabetes mellitus with hyperglycemia: Secondary | ICD-10-CM

## 2019-03-23 DIAGNOSIS — Z794 Long term (current) use of insulin: Secondary | ICD-10-CM | POA: Diagnosis not present

## 2019-03-23 NOTE — Patient Instructions (Addendum)
    try to check blood sugars very consistently with every meal and some 2 hours after meals  can increase the bolus by 2 to 3 units manually if  getting more carbohydrate or higher fat meal  CHECK 4X DAILY  trate de controlar el azcar en la sangre de manera muy consistente con cada comida y unas 2 horas despus de las comidas puede aumentar el bolo de 2 a 3 unidades manualmente si obtiene ms carbohidratos o una comida alta en grasas COMPROBAR 4X DIARIO

## 2019-03-23 NOTE — Progress Notes (Signed)
Patient ID: Samuel [name], male   DOB: 1951/03/18, 68 y.o.   MRN: [id number]            Reason for Appointment:  Followup for Type 2 Diabetes  Referring physician: Saguier  History of Present Illness:          Diagnosis: Type 2 diabetes mellitus, date of diagnosis:  1990      Past history: He thinks he has been on insulin for the last 10-12 years. Previously had been on metformin which has been continued. He was probably tried on different insulin regimens initially but has been taking 70/30 mostly because of cost Previous records are not available and appears that his sugars have been poorly controlled for several years He was  referred here because of an A1c in 8/15 of 9.7%. He was on a regimen of Novolin 70/30 twice a day but because of inadequate control and higher readings later in the day he was switched to basal bolus insulin regimen in 12/15. Previous insulin regimen: Humalog  ac meals 35-40-30 Toujeo 35-40 units daily  Recent history:   INSULIN regimen is: OMNIPOD INSULIN PUMP  Basal rate settings: Midnight = 1.3, 6am: 1.65, 5 PM = 1.6.  Boluses 12 units breakfast -14 units lunch and 18 units at dinner at meals: Carbohydrate coverage 1:10 and correction 1:50   Non-insulin hypoglycemic drugs: metformin 1 g twice a day, Trulicity 1.5 mg weekly,Invokamet 50/1000  A1c has been variable, it is improved at 7.3 compared to 7.7   Current management, blood sugar patterns and problems identified:  He is using the meter associated with his pump to check his blood sugars  Although he has been advised to check sugars consistently mostly document breakfast and dinnertime  FASTING blood sugars are very consistent from his pump download without any overnight hypoglycemia  Previously his basal rate during the day was increased since he had higher readings later in the day  However occasionally may have low normal readings like 73 at lunchtime which is unusual  He will feel  hypoglycemic only when the blood sugar is around 70  He does appear to be occasionally eating larger meals or more carbohydrate with higher readings after dinner periodically  Also not clear if this could be related to snacks  Likely has had only 1 incidence of missed boluses at dinnertime in the last 2 weeks from his download  Although he was told to adjust his bolus and go up or down 2 to 4 units for variable sizes of his meals he has not done so, usually taking 15 units at dinnertime  He was told on his last visit to take Invokamet consistently at breakfast but he mostly takes it twice a day  Also regular with Trulicity  He is trying to walk regularly  However has not lost any weight        Side effects from medications have been: ?  Nausea/dizziness on Victoza  Compliance with the medical regimen: Fair   Glucose monitoring:  done 2-3 times a day         Glucometer:  Freestyle/Omnipod      Blood Glucose readings by download    PRE-MEAL Fasting Lunch Dinner Bedtime Overall  Glucose range:  111-139  73-146  135-177  103-237   Mean/median:      136   Previous readings:  PRE-MEAL Fasting Lunch Dinner Bedtime Overall  Glucose range:  107-150  234  155, 197  128-217   Mean/median:  148   POST-MEAL PC Breakfast PC Lunch PC Dinner  Glucose range:   133  112-235  Mean/median:        Self-care:  Variable portions, may have unbalanced meals  Meals: 3 meals per day. Breakfast at 8-9 am is various kinds of bread, milk and sometimes eggs. Lunch generally larger meal  Dinner at 6 pm     Dietician visit, most recent: 5/16 Last CDE visit: 12/2016            Weight history: Previous range 200-220  Wt Readings from Last 3 Encounters:  03/23/19 207 lb (93.9 kg)  12/20/18 207 lb (93.9 kg)  10/24/18 205 lb (93 kg)    Glycemic control:   Lab Results  Component Value Date   HGBA1C 7.3 (H) 03/16/2019   HGBA1C 7.7 (H) 12/15/2018   HGBA1C 7.0 (H) 09/08/2018   Lab  Results  Component Value Date   MICROALBUR <0.7 03/16/2019   LDLCALC 20 05/26/2018   CREATININE 0.70 03/16/2019    Lab Results  Component Value Date   FRUCTOSAMINE 302 (H) 07/20/2017       Allergies as of 03/23/2019   No Known Allergies     Medication List       Accurate as of Mar 23, 2019  9:29 PM. If you have any questions, ask your nurse or doctor.        STOP taking these medications   doxycycline 100 MG tablet Commonly known as:  VIBRA-TABS Stopped by:  Reather Littler, MD     TAKE these medications   aspirin EC 81 MG tablet Take 81 mg by mouth at bedtime.   atorvastatin 40 MG tablet Commonly known as:  LIPITOR TAKE 1 TABLET BY MOUTH DAILY AND 1/2 TABLET BY MOUTH EVERY NIGHT AT BEDTIME What changed:    how much to take  how to take this  when to take this   diazepam 5 MG tablet Commonly known as:  Valium Take 1 tablet (5 mg total) by mouth every 6 (six) hours as needed for muscle spasms.   glucose blood test strip Commonly known as:  FREESTYLE TEST STRIPS TEST AS DIRECTED FOUR TIMES DAILY   insulin lispro 100 UNIT/ML injection Commonly known as:  HUMALOG Inject 0.9 mLs (90 Units total) into the skin daily. What changed:    how much to take  additional instructions   Insulin Pen Needle 32G X 4 MM Misc Use 5 pens per day to inject insulin What changed:  Another medication with the same name was changed. Make sure you understand how and when to take each.   B-D UF III MINI PEN NEEDLES 31G X 5 MM Misc Generic drug:  Insulin Pen Needle USE FIVE PER DAY AS DIRECTED What changed:  See the new instructions.   INSULIN SYRINGE .5CC/30GX5/16" 30G X 5/16" 0.5 ML Misc Use 3 times a day for insulin   Invokamet XR 50-1000 MG Tb24 Generic drug:  Canagliflozin-metFORMIN HCl ER TAKE 2 TABLETS BY MOUTH EVERY DAY What changed:    how much to take  when to take this  additional instructions   losartan 50 MG tablet Commonly known as:  COZAAR TAKE 1  TABLET(50 MG) BY MOUTH DAILY   meloxicam 15 MG tablet Commonly known as:  MOBIC Take 1 tablet (15 mg total) by mouth daily.   NON FORMULARY Take 2 capsules by mouth 2 (two) times daily. 4LIFE TRANSFER FACTORY CARDIO   OmniPod 5 Pack Misc CHANGE EVERY 48 HOURS  AS DIRECTED   OVER THE COUNTER MEDICATION Take 2 tablets by mouth 2 (two) times daily. 4LIFE TRANSFER FACTORY RECALL   tiZANidine 4 MG tablet Commonly known as:  ZANAFLEX Take 1 tablet (4 mg total) by mouth every 8 (eight) hours as needed.   Trulicity 0.75 MG/0.5ML Sopn Generic drug:  Dulaglutide INJECT 0.75MG (0.5ML) INTO THE SKIN ONCE A WEEK       Allergies: No Known Allergies  Past Medical History:  Diagnosis Date  . Blood transfusion without reported diagnosis   . Diabetes mellitus without complication (HCC)    type II  . History of ETOH abuse   . Hypertension     Past Surgical History:  Procedure Laterality Date  . BACK SURGERY N/A 2010    Family History  Problem Relation Age of Onset  . Diabetes Unknown   . Hypertension Unknown   . Arthritis Father   . Diabetes Father     Social History:  reports that he has quit smoking. He quit smokeless tobacco use about 25 years ago. He reports that he does not drink alcohol or use drugs.    Review of Systems         Lipids:  He has been on Lipitor 40 mg for hypercholesterolemia since about 2011, lipid levels shows good control  Lipids followed by PCP        Lab Results  Component Value Date   CHOL 86 05/26/2018   HDL 38.00 (L) 05/26/2018   LDLCALC 20 05/26/2018   LDLDIRECT 40.0 02/13/2016   TRIG 138.0 05/26/2018   CHOLHDL 2 05/26/2018    Hypertension has been treated with losartan 50 mg, prescribed by PCP Microalbumin normal as of 03/2019   Physical Examination:  BP 116/60 (BP Location: Left Arm, Patient Position: Sitting, Cuff Size: Normal)   Pulse 75   Ht 5\' 3"  (1.6 m)   Wt 207 lb (93.9 kg)   SpO2 95%   BMI 36.67 kg/m       ASSESSMENT:  Diabetes type 2, with obesity and BMI of over 36  See history of present illness for  description of current diabetes management, blood sugar patterns and problems identified..  His diabetes is being managed with the Omnipod insulin pump, metformin and Trulicity 0.75 mg  He is having slightly better blood sugar control with A1c 7.3  However he may be still getting high readings especially after evening meals since his A1c is higher than his home blood sugar reading of 136 pre-meal He is expressing some interest in doing the freestyle libre but currently does not meet Medicare guidelines with checking 4 times a day He does occasionally have high readings in the evenings based on his meal size Again he is not appearing to understand the need to adjust his mealtime bolus based on how much he is eating He may be taking some boluses late or missing occasional boluses as discussed above  Reviewed his blood sugar patterns that were evaluated from pump download, day-to-day bolus management and ideal blood sugar readings before and after meals Also explained to him that he can take both tablets of the Invokamet with breakfast as he may occasionally miss the evening dose Currently weight is stable  HYPERTENSION: Well-controlled with current regimen    PLAN:   He will check his blood sugars 4 times a day including some after meals by rotation Showed him how the freestyle Joseph[facility] Igol[employer]re works and will see if he qualifies on the next visit Continue regular exercise Discussed  that if he is having any hypoglycemia especially when he is walking he needs to report this to [place] Consider increasing Trulicity if not able to lose weight Currently no change in basal rate required Discussed adjusting boluses manually by 2 to 3 units when having variable size of meals especially in the evening Make sure he covers high readings with correction boluses  Counseling time on subjects discussed in assessment  and plan sections is over 50% of today's 25 minute visit     Patient Instructions    try to check blood sugars very consistently with every meal and some 2 hours after meals  can increase the bolus by 2 to 3 units manually if  getting more carbohydrate or higher fat meal  CHECK 4X DAILY  trate de controlar el azcar en la sangre de manera muy consistente con cada comida y unas 2 horas despus de las comidas puede aumentar el bolo de 2 a 3 unidades manualmente si obtiene ms carbohidratos o una comida alta en grasas COMPROBAR 4X DIARIO     Reather Littler 03/23/2019, 9:29 PM   Note: This office note was prepared with Dragon voice recognition system technology. Any transcriptional errors that result from this process are unintentional.

## 2019-04-10 ENCOUNTER — Ambulatory Visit (INDEPENDENT_AMBULATORY_CARE_PROVIDER_SITE_OTHER): Payer: Medicare Other | Admitting: Medical

## 2019-04-10 ENCOUNTER — Other Ambulatory Visit: Payer: Self-pay

## 2019-04-10 ENCOUNTER — Encounter: Payer: Self-pay | Admitting: Medical

## 2019-04-10 DIAGNOSIS — B354 Tinea corporis: Secondary | ICD-10-CM

## 2019-04-10 MED ORDER — TERBINAFINE HCL 250 MG PO TABS: 250.0000 mg | ORAL_TABLET | Freq: Every day | ORAL | 0 refills | Status: DC

## 2019-04-10 NOTE — Patient Instructions (Signed)
By video examination rash with central clearing on arms makes me consider tinea corporis as most likely dx. Since 2 areas present advised use topical lamisil cream otc twice daily but also rx'd 7 days oral lamisil. Will see if area improves. If not consider referral to dermatologist.  Follow up in 10 days or as needed

## 2019-04-10 NOTE — Progress Notes (Addendum)
Subjective:    Patient ID: Samuel [name], male    DOB: [birth year], 68 y.o.   MRN: [id number]  HPI  Virtual Visit via Telephone Note  I connected with Manuela Schwartz on 04/10/19 at 10:20 AM EDT by telephone and verified that I am speaking with the correct person using two identifiers.  Location: Patient: home Provider: home     I discussed the limitations, risks, security and privacy concerns of performing an evaluation and management service by telephone and the availability of in person appointments. I also discussed with the patient that there may be a patient responsible charge related to this service. The patient expressed understanding and agreed to proceed.   History of Present Illness: Pt has rash 3-4 weeks. Has rash that was itching mildly at beginning. Now not itching. No insect or tick bites reported.    Observations/Objective: General-no acute distress, pleasant, oriented. Lungs- on inspection lungs appear unlabored. Neck- no tracheal deviation or jvd on inspection. Neuro- gross motor function appears intact. Skin- left antecubital fossae rash. Raised thin rim red rash with central clearing. Diameter is about 3 cm. Rt forearm- same appearance but mid rt forearm ventral aspect.   Assessment and Plan: By video examination rash with central clearing on arms makes me consider tinea corporis as most likely dx. Since 2 areas present advised use topical lamisil cream otc twice daily but also rx'd 7 days oral lamisil. Will see if area improves. If not consider referral to dermatologist.  Follow up in 10 days or as needed  15 minutes spent with pt. 50% of time spent counseling pt on diagnosis and treatment.  Follow Up Instructions:    I discussed the assessment and treatment plan with the patient. The patient was provided an opportunity to ask questions and all were answered. The patient agreed with the plan and demonstrated an understanding of the instructions.   The  patient was advised to call back or seek an in-person evaluation if the symptoms worsen or if the condition fails to improve as anticipated.     Esperanza Richters, PA-C    Review of Systems  Constitutional: Negative for chills, fatigue and fever.  Respiratory: Negative for cough and chest tightness.   Cardiovascular: Negative for chest pain and palpitations.  Gastrointestinal: Negative for abdominal pain.  Skin: Positive for rash.  Neurological: Negative for dizziness, weakness, numbness and headaches.  Hematological: Negative for adenopathy. Does not bruise/bleed easily.  Psychiatric/Behavioral: Negative for behavioral problems and confusion.   Past Medical History:  Diagnosis Date  . Blood transfusion without reported diagnosis   . Diabetes mellitus without complication (HCC)    type II  . History of ETOH abuse   . Hypertension      Social History   Socioeconomic History  . Marital status: Married    Spouse name: Not on file  . Number of children: Not on file  . Years of education: Not on file  . Highest education level: Not on file  Occupational History  . Not on file  Social Needs  . Financial resource strain: Not on file  . Food insecurity:    Worry: Not on file    Inability: Not on file  . Transportation needs:    Medical: Not on file    Non-medical: Not on file  Tobacco Use  . Smoking status: Former Games developer  . Smokeless tobacco: Former Neurosurgeon    Quit date: 11/08/1993  Substance and Sexual Activity  . Alcohol use: No  Alcohol/week: 0.0 standard drinks    Comment: occasional  . Drug use: No  . Sexual activity: Not on file  Lifestyle  . Physical activity:    Days per week: Not on file    Minutes per session: Not on file  . Stress: Not on file  Relationships  . Social connections:    Talks on phone: Not on file    Gets together: Not on file    Attends religious service: Not on file    Active member of club or organization: Not on file    Attends  meetings of clubs or organizations: Not on file    Relationship status: Not on file  . Intimate partner violence:    Fear of current or ex partner: Not on file    Emotionally abused: Not on file    Physically abused: Not on file    Forced sexual activity: Not on file  Other Topics Concern  . Not on file  Social History Narrative  . Not on file    Past Surgical History:  Procedure Laterality Date  . BACK SURGERY N/A 2010    Family History  Problem Relation Age of Onset  . Diabetes Unknown   . Hypertension Unknown   . Arthritis Father   . Diabetes Father     No Known Allergies  Current Outpatient Medications on File Prior to Visit  Medication Sig Dispense Refill  . aspirin EC 81 MG tablet Take 81 mg by mouth at bedtime.    [name] atorvastatin (LIPITOR) 40 MG tablet TAKE 1 TABLET BY MOUTH DAILY AND 1/2 TABLET BY MOUTH EVERY NIGHT AT BEDTIME (Patient taking differently: Take 40 mg by mouth at bedtime. TAKE 1 TABLET BY MOUTH DAILY AND 1/2 TABLET BY MOUTH EVERY NIGHT AT BEDTIME) 45 tablet 0  . B-D UF III MINI PEN NEEDLES 31G X 5 MM MISC USE FIVE PER DAY AS DIRECTED (Patient taking differently: 1 each by Other route 5 (five) times daily. USE FIVE PER DAY AS DIRECTED) 200 each 0  . diazepam (VALIUM) 5 MG tablet Take 1 tablet (5 mg total) by mouth every 6 (six) hours as needed for muscle spasms. 40 tablet 0  . glucose blood (FREESTYLE TEST STRIPS) test strip TEST AS DIRECTED FOUR TIMES DAILY 150 each 1  . Insulin Disposable Pump (OMNIPOD 5 PACK) MISC CHANGE EVERY 48 HOURS AS DIRECTED 45 each 2  . insulin lispro (HUMALOG) 100 UNIT/ML injection Inject 0.9 mLs (90 Units total) into the skin daily. (Patient taking differently: Inject 0-90 Units into the skin daily. With Insulin Pump--Bolus 12 units in the morning, 14 units in the afternoon, & 18 units at night) 40 mL 5  . Insulin Pen Needle 32G X 4 MM MISC Use 5 pens per day to inject insulin 200 each 3  . Insulin Syringe-Needle U-100 (INSULIN  SYRINGE .5CC/30GX5/16") 30G X 5/16" 0.5 ML MISC Use 3 times a day for insulin 100 each 1  . INVOKAMET XR 50-1000 MG TB24 TAKE 2 TABLETS BY MOUTH EVERY DAY (Patient taking differently: Take 1 tablet by mouth 2 (two) times a day. Take 1 tablet by mouth in the morning and 1 tablet in the afternoon.) 60 tablet 3  . losartan (COZAAR) 50 MG tablet TAKE 1 TABLET(50 MG) BY MOUTH DAILY 90 tablet 0  . meloxicam (MOBIC) 15 MG tablet Take 1 tablet (15 mg total) by mouth daily. 30 tablet 2  . NON FORMULARY Take 2 capsules by mouth 2 (two) times daily.  4LIFE TRANSFER FACTORY CARDIO    . OVER THE COUNTER MEDICATION Take 2 tablets by mouth 2 (two) times daily. 4LIFE TRANSFER FACTORY RECALL    . tiZANidine (ZANAFLEX) 4 MG tablet Take 1 tablet (4 mg total) by mouth every 8 (eight) hours as needed. 60 tablet 1  . TRULICITY 0.75 MG/0.5ML SOPN INJECT 0.75MG (0.5ML) INTO THE SKIN ONCE A WEEK 4 mL 2   No current facility-administered medications on file prior to visit.     There were no vitals taken for this visit.      Objective:   Physical Exam        Assessment & Plan:

## 2019-04-20 ENCOUNTER — Ambulatory Visit (INDEPENDENT_AMBULATORY_CARE_PROVIDER_SITE_OTHER): Payer: Medicare Other | Admitting: Medical

## 2019-04-20 ENCOUNTER — Encounter: Payer: Self-pay | Admitting: Medical

## 2019-04-20 ENCOUNTER — Other Ambulatory Visit: Payer: Self-pay

## 2019-04-20 VITALS — BP 130/73

## 2019-04-20 DIAGNOSIS — B354 Tinea corporis: Secondary | ICD-10-CM | POA: Diagnosis not present

## 2019-04-20 DIAGNOSIS — R21 Rash and other nonspecific skin eruption: Secondary | ICD-10-CM

## 2019-04-20 DIAGNOSIS — I10 Essential (primary) hypertension: Secondary | ICD-10-CM

## 2019-04-20 NOTE — Progress Notes (Signed)
Subjective:    Patient ID: Samuel [name], male    DOB: September 30, 1951, 68 y.o.   MRN: [id number]  HPI Virtual Visit via Video Note  I connected with [name] on 04/20/19 at  4:00 PM EDT by a video enabled telemedicine application and verified that I am speaking with the correct person using two identifiers.  Location: Patient: home Provider: home   I discussed the limitations of evaluation and management by telemedicine and the availability of in person appointments. The patient expressed understanding and agreed to proceed.  History of Present Illness: Pt rash is much better. Rt side forearm  clear 100%. Left side  Bicep area about 98% resolved. Pt had raised red rim rash with central clearing. Pt used lamisil oral and lamisil rtop   Observations/Objective: General-no acute distress, pleasant, oriented. Lungs- on inspection lungs appear unlabored. Neck- no tracheal deviation or jvd on inspection. Neuro- gross motor function appears intact. Skin- rt forearm rash cleared completely. Left bicep. Faint minimal rash linear pattern. Length about 1 cm now. Barely visible.  Assessment and Plan: Your skin rash areas are much better/resolved for most part. Resolved tinea corporis. At this point only recommend apply topical lamisil twice daily to left bicep area for 7 days.  Your bp is elevated today at 170/78. You bp is usually low/controlled and you report no signs or symptoms. Your machine is 68 years old and you question if is working. Want you to buy new electronic bp cuff and check bp today. Notifiy [place] on Monday/talk with [name] and give her bp reading. If bp is over 140/90 then will need adjust bp med regimen. Last bp reading with [name] was very good.  Follow up august in office or as needed  Mackie Pai, PA-C  Follow Up Instructions:    I discussed the assessment and treatment plan with the patient. The patient was provided an opportunity to ask questions and all were  answered. The patient agreed with the plan and demonstrated an understanding of the instructions.   The patient was advised to call back or seek an in-person evaluation if the symptoms worsen or if the condition fails to improve as anticipated.  I provided 15 minutes of non-face-to-face time during this encounter.   Mackie Pai, PA-C    Review of Systems  Constitutional: Negative for activity change, chills, diaphoresis, fatigue and fever.  Respiratory: Negative for cough, chest tightness and shortness of breath.   Cardiovascular: Negative for chest pain, palpitations and leg swelling.  Gastrointestinal: Negative for abdominal pain, nausea and vomiting.  Musculoskeletal: Negative for neck pain and neck stiffness.  Neurological: Negative for dizziness, syncope, facial asymmetry, weakness, light-headedness and headaches.  Psychiatric/Behavioral: Negative for agitation, behavioral problems and confusion. The patient is not nervous/anxious.    Past Medical History:  Diagnosis Date  . Blood transfusion without reported diagnosis   . Diabetes mellitus without complication ([place])    type II  . History of ETOH abuse   . Hypertension      Social History   Socioeconomic History  . Marital status: Married    Spouse name: Not on file  . Number of children: Not on file  . Years of education: Not on file  . Highest education level: Not on file  Occupational History  . Not on file  Social Needs  . Financial resource strain: Not on file  . Food insecurity    Worry: Not on file    Inability: Not on file  .  Transportation needs    Medical: Not on file    Non-medical: Not on file  Tobacco Use  . Smoking status: Former Games developermoker  . Smokeless tobacco: Former NeurosurgeonUser    Quit date: 11/08/1993  Substance and Sexual Activity  . Alcohol use: No    Alcohol/week: 0.0 standard drinks    Comment: occasional  . Drug use: No  . Sexual activity: Not on file  Lifestyle  . Physical activity    Days  per week: Not on file    Minutes per session: Not on file  . Stress: Not on file  Relationships  . Social Musicianconnections    Talks on phone: Not on file    Gets together: Not on file    Attends religious service: Not on file    Active member of club or organization: Not on file    Attends meetings of clubs or organizations: Not on file    Relationship status: Not on file  . Intimate partner violence    Fear of current or ex partner: Not on file    Emotionally abused: Not on file    Physically abused: Not on file    Forced sexual activity: Not on file  Other Topics Concern  . Not on file  Social History Narrative  . Not on file    Past Surgical History:  Procedure Laterality Date  . BACK SURGERY N/A 2010    Family History  Problem Relation Age of Onset  . Diabetes Unknown   . Hypertension Unknown   . Arthritis Father   . Diabetes Father     No Known Allergies  Current Outpatient Medications on File Prior to Visit  Medication Sig Dispense Refill  . aspirin EC 81 MG tablet Take 81 mg by mouth at bedtime.    [name]. atorvastatin (LIPITOR) 40 MG tablet TAKE 1 TABLET BY MOUTH DAILY AND 1/2 TABLET BY MOUTH EVERY NIGHT AT BEDTIME (Patient taking differently: Take 40 mg by mouth at bedtime. TAKE 1 TABLET BY MOUTH DAILY AND 1/2 TABLET BY MOUTH EVERY NIGHT AT BEDTIME) 45 tablet 0  . B-D UF III MINI PEN NEEDLES 31G X 5 MM MISC USE FIVE PER DAY AS DIRECTED (Patient taking differently: 1 each by Other route 5 (five) times daily. USE FIVE PER DAY AS DIRECTED) 200 each 0  . diazepam (VALIUM) 5 MG tablet Take 1 tablet (5 mg total) by mouth every 6 (six) hours as needed for muscle spasms. 40 tablet 0  . glucose blood (FREESTYLE TEST STRIPS) test strip TEST AS DIRECTED FOUR TIMES DAILY 150 each 1  . Insulin Disposable Pump (OMNIPOD 5 PACK) MISC CHANGE EVERY 48 HOURS AS DIRECTED 45 each 2  . insulin lispro (HUMALOG) 100 UNIT/ML injection Inject 0.9 mLs (90 Units total) into the skin daily. (Patient  taking differently: Inject 0-90 Units into the skin daily. With Insulin Pump--Bolus 12 units in the morning, 14 units in the afternoon, & 18 units at night) 40 mL 5  . Insulin Pen Needle 32G X 4 MM MISC Use 5 pens per day to inject insulin 200 each 3  . Insulin Syringe-Needle U-100 (INSULIN SYRINGE .5CC/30GX5/16") 30G X 5/16" 0.5 ML MISC Use 3 times a day for insulin 100 each 1  . INVOKAMET XR 50-1000 MG TB24 TAKE 2 TABLETS BY MOUTH EVERY DAY (Patient taking differently: Take 1 tablet by mouth 2 (two) times a day. Take 1 tablet by mouth in the morning and 1 tablet in the afternoon.) 60 tablet  3  . losartan (COZAAR) 50 MG tablet TAKE 1 TABLET(50 MG) BY MOUTH DAILY 90 tablet 0  . meloxicam (MOBIC) 15 MG tablet Take 1 tablet (15 mg total) by mouth daily. 30 tablet 2  . NON FORMULARY Take 2 capsules by mouth 2 (two) times daily. 4LIFE TRANSFER FACTORY CARDIO    . OVER THE COUNTER MEDICATION Take 2 tablets by mouth 2 (two) times daily. 4LIFE TRANSFER FACTORY RECALL    . terbinafine (LAMISIL) 250 MG tablet Take 1 tablet (250 mg total) by mouth daily. 7 tablet 0  . tiZANidine (ZANAFLEX) 4 MG tablet Take 1 tablet (4 mg total) by mouth every 8 (eight) hours as needed. 60 tablet 1  . TRULICITY 0.75 MG/0.5ML SOPN INJECT 0.75MG (0.5ML) INTO THE SKIN ONCE A WEEK 4 mL 2   No current facility-administered medications on file prior to visit.     There were no vitals taken for this visit.      Objective:   Physical Exam        Assessment & Plan:

## 2019-04-20 NOTE — Patient Instructions (Addendum)
Your skin rash areas are much better/resolved for most part. Resolved tinea corporis. At this point only recommend apply topical lamisil twice daily to left bicep area for 7 days.  Your bp is elevated today at 170/78. You bp is usually low/controlled and you report no signs or symptoms. Your machine is 68 years old and you question if is working. Want you to buy new electronic bp cuff and check bp today. Notifiy [place] on Monday/talk with [name] and give her bp reading. If bp is over 140/90 then will need adjust bp med regimen. Last bp reading with [name] was very good.  Follow up august in office or as needed

## 2019-04-23 ENCOUNTER — Telehealth: Payer: Self-pay | Admitting: Medical

## 2019-04-23 NOTE — Telephone Encounter (Unsigned)
Copied from [place] [id number]. Topic: Quick Communication - See Telephone Encounter >> Apr 23, 2019  4:34 PM Valla Leaver wrote: CRM for notification. See Telephone encounter for: 04/23/19. Belenda Cruise, daughter calling to report blood pressure readings.  06/12-130/73 around 5:30pm

## 2019-04-24 ENCOUNTER — Encounter: Payer: Self-pay | Admitting: Medical

## 2019-05-18 ENCOUNTER — Other Ambulatory Visit: Payer: Self-pay | Admitting: Endocrinology

## 2019-06-18 ENCOUNTER — Other Ambulatory Visit: Payer: Self-pay | Admitting: Endocrinology

## 2019-06-21 ENCOUNTER — Other Ambulatory Visit: Payer: Self-pay | Admitting: Endocrinology

## 2019-06-25 ENCOUNTER — Other Ambulatory Visit: Payer: Self-pay

## 2019-06-25 ENCOUNTER — Encounter: Payer: Self-pay | Admitting: Medical

## 2019-06-25 ENCOUNTER — Ambulatory Visit (INDEPENDENT_AMBULATORY_CARE_PROVIDER_SITE_OTHER): Payer: Medicare Other | Admitting: Medical

## 2019-06-25 VITALS — BP 138/71 | HR 63 | Temp 97.8°F | Resp 16 | Ht 63.0 in | Wt 206.0 lb

## 2019-06-25 DIAGNOSIS — R109 Unspecified abdominal pain: Secondary | ICD-10-CM

## 2019-06-25 DIAGNOSIS — I10 Essential (primary) hypertension: Secondary | ICD-10-CM | POA: Diagnosis not present

## 2019-06-25 DIAGNOSIS — Z794 Long term (current) use of insulin: Secondary | ICD-10-CM

## 2019-06-25 DIAGNOSIS — E785 Hyperlipidemia, unspecified: Secondary | ICD-10-CM

## 2019-06-25 DIAGNOSIS — E119 Type 2 diabetes mellitus without complications: Secondary | ICD-10-CM | POA: Diagnosis not present

## 2019-06-25 NOTE — Progress Notes (Signed)
Subjective:    Patient ID: Samuel [name], male    DOB: [birth year], 68 y.o.   MRN: [id number]  HPI  Pt in for follow up.  Pt diabetes, htn and high cholesterol.  Last a1c was 7.3 on last visit.   Pt last cholesterol was controlled.  Pt htn/bp relatively well controlled today.  Pt states one week sour taste in mouth and mild upset stomach past week. Will feel some reflux when eats late at night and then lays down. Pt not using any medication for reflux.    Review of Systems  Constitutional: Negative for activity change, chills and fatigue.  HENT: Negative for congestion, ear discharge and facial swelling.   Respiratory: Negative for chest tightness, shortness of breath, wheezing and stridor.   Cardiovascular: Negative for chest pain and palpitations.  Genitourinary: Negative for dysuria, frequency, genital sores and hematuria.  Musculoskeletal: Negative for back pain, joint swelling and neck stiffness.  Skin: Negative for rash.  Neurological: Negative for dizziness, syncope, weakness, numbness and headaches.  Hematological: Negative for adenopathy. Does not bruise/bleed easily.  Psychiatric/Behavioral: Negative for behavioral problems, dysphoric mood and suicidal ideas. The patient is not nervous/anxious.     Past Medical History:  Diagnosis Date  . Blood transfusion without reported diagnosis   . Diabetes mellitus without complication (HCC)    type II  . History of ETOH abuse   . Hypertension      Social History   Socioeconomic History  . Marital status: Married    Spouse name: Not on file  . Number of children: Not on file  . Years of education: Not on file  . Highest education level: Not on file  Occupational History  . Not on file  Social Needs  . Financial resource strain: Not on file  . Food insecurity    Worry: Not on file    Inability: Not on file  . Transportation needs    Medical: Not on file    Non-medical: Not on file  Tobacco Use  . Smoking  status: Former Games developermoker  . Smokeless tobacco: Former NeurosurgeonUser    Quit date: 11/08/1993  Substance and Sexual Activity  . Alcohol use: No    Alcohol/week: 0.0 standard drinks    Comment: occasional  . Drug use: No  . Sexual activity: Not on file  Lifestyle  . Physical activity    Days per week: Not on file    Minutes per session: Not on file  . Stress: Not on file  Relationships  . Social Musicianconnections    Talks on phone: Not on file    Gets together: Not on file    Attends religious service: Not on file    Active member of club or organization: Not on file    Attends meetings of clubs or organizations: Not on file    Relationship status: Not on file  . Intimate partner violence    Fear of current or ex partner: Not on file    Emotionally abused: Not on file    Physically abused: Not on file    Forced sexual activity: Not on file  Other Topics Concern  . Not on file  Social History Narrative  . Not on file    Past Surgical History:  Procedure Laterality Date  . BACK SURGERY N/A 2010    Family History  Problem Relation Age of Onset  . Diabetes Unknown   . Hypertension Unknown   . Arthritis Father   . Diabetes  Father     No Known Allergies  Current Outpatient Medications on File Prior to Visit  Medication Sig Dispense Refill  . aspirin EC 81 MG tablet Take 81 mg by mouth at bedtime.    [name] atorvastatin (LIPITOR) 40 MG tablet TAKE 1 TABLET BY MOUTH DAILY AND 1/2 TABLET BY MOUTH EVERY NIGHT AT BEDTIME (Patient taking differently: Take 40 mg by mouth at bedtime. TAKE 1 TABLET BY MOUTH DAILY AND 1/2 TABLET BY MOUTH EVERY NIGHT AT BEDTIME) 45 tablet 0  . B-D UF III MINI PEN NEEDLES 31G X 5 MM MISC USE FIVE PER DAY AS DIRECTED (Patient taking differently: 1 each by Other route 5 (five) times daily. USE FIVE PER DAY AS DIRECTED) 200 each 0  . diazepam (VALIUM) 5 MG tablet Take 1 tablet (5 mg total) by mouth every 6 (six) hours as needed for muscle spasms. 40 tablet 0  . glucose blood  (FREESTYLE TEST STRIPS) test strip TEST AS DIRECTED FOUR TIMES DAILY 100 strip 2  . Insulin Disposable Pump (OMNIPOD 5 PACK) MISC CHANGE EVERY 48 HOURS AS DIRECTED 45 each 2  . insulin lispro (HUMALOG) 100 UNIT/ML injection Use up to 90 units daily via insulin pump. 90 mL 3  . Insulin Pen Needle 32G X 4 MM MISC Use 5 pens per day to inject insulin 200 each 3  . Insulin Syringe-Needle U-100 (INSULIN SYRINGE .5CC/30GX5/16") 30G X 5/16" 0.5 ML MISC Use 3 times a day for insulin 100 each 1  . INVOKAMET XR 50-1000 MG TB24 TAKE 2 TABLETS BY MOUTH EVERY DAY (Patient taking differently: Take 1 tablet by mouth 2 (two) times a day. Take 1 tablet by mouth in the morning and 1 tablet in the afternoon.) 60 tablet 3  . losartan (COZAAR) 50 MG tablet TAKE 1 TABLET(50 MG) BY MOUTH DAILY 90 tablet 0  . meloxicam (MOBIC) 15 MG tablet Take 1 tablet (15 mg total) by mouth daily. 30 tablet 2  . NON FORMULARY Take 2 capsules by mouth 2 (two) times daily. 4LIFE TRANSFER FACTORY CARDIO    . OVER THE COUNTER MEDICATION Take 2 tablets by mouth 2 (two) times daily. 4LIFE TRANSFER FACTORY RECALL    . terbinafine (LAMISIL) 250 MG tablet Take 1 tablet (250 mg total) by mouth daily. 7 tablet 0  . tiZANidine (ZANAFLEX) 4 MG tablet Take 1 tablet (4 mg total) by mouth every 8 (eight) hours as needed. 60 tablet 1  . TRULICITY 4.31 VQ/0.0QQ SOPN ADMINISTER 0.5 ML(0.75 MG) UNDER THE SKIN 1 TIME A WEEK 4 mL 2   No current facility-administered medications on file prior to visit.     BP 138/71 (BP Location: Right Arm, Patient Position: Sitting, Cuff Size: Small)   Pulse 63   Temp 97.8 F (36.6 C) (Temporal)   Resp 16   Ht 5\' 3"  (1.6 m)   Wt 206 lb (93.4 kg)   SpO2 99%   BMI 36.49 kg/m       Objective:   Physical Exam   General Mental Status- Alert. General Appearance- Not in acute distress.   Skin General: Color- Normal Color. Moisture- Normal Moisture.  Neck Carotid Arteries- Normal color. Moisture- Normal  Moisture. No carotid bruits. No JVD.  Chest and Lung Exam Auscultation: Breath Sounds:-Normal.  Cardiovascular Auscultation:Rythm- Regular. Murmurs & Other Heart Sounds:Auscultation of the heart reveals- No Murmurs.  Abdomen Inspection:-Inspeection Normal. Palpation/Percussion:Note:No mass. Palpation and Percussion of the abdomen reveal- Non Tender, Non Distended + BS, no rebound or guarding.  Neurologic Cranial Nerve exam:- CN III-XII intact(No nystagmus), symmetric smile. Strength:- 5/5 equal and symmetric strength both upper and lower extremities.      Assessment & Plan:  For diabetes will check a1c this week and let your endocrinologist know results of a1c.  For hyperlipidemia will get lipid panel this week fasting.  For htn, continue current medication. Will get cmp.  For upset stomach will get h-pylrori breath test.  Follow up date to be determined after lab review.     Esperanza RichtersEdward Redonna Wilbert, PA-C

## 2019-06-25 NOTE — Patient Instructions (Addendum)
For diabetes will check a1c this week and let your endocrinologist know results of a1c.  For hyperlipidemia will get lipid panel this week fasting.  For htn, continue current medication. Will get cmp.  For upset stomach will get h-pylrori breath test.  Follow up date to be determined after lab review.

## 2019-06-28 ENCOUNTER — Other Ambulatory Visit (INDEPENDENT_AMBULATORY_CARE_PROVIDER_SITE_OTHER): Payer: Medicare Other

## 2019-06-28 ENCOUNTER — Other Ambulatory Visit: Payer: Self-pay

## 2019-06-28 DIAGNOSIS — I10 Essential (primary) hypertension: Secondary | ICD-10-CM | POA: Diagnosis not present

## 2019-06-28 DIAGNOSIS — R109 Unspecified abdominal pain: Secondary | ICD-10-CM

## 2019-06-28 LAB — COMPREHENSIVE METABOLIC PANEL
ALT: 23 U/L (ref 0–53)
AST: 23 U/L (ref 0–37)
Albumin: 4.6 g/dL (ref 3.5–5.2)
Alkaline Phosphatase: 48 U/L (ref 39–117)
BUN: 19 mg/dL (ref 6–23)
CO2: 30 mEq/L (ref 19–32)
Calcium: 9.6 mg/dL (ref 8.4–10.5)
Chloride: 101 mEq/L (ref 96–112)
Creatinine, Ser: 0.81 mg/dL (ref 0.40–1.50)
GFR: 94.77 mL/min (ref >60.00)
Glucose, Bld: 130 mg/dL — ABNORMAL HIGH (ref 70–99)
Potassium: 4.3 mEq/L (ref 3.5–5.1)
Sodium: 140 mEq/L (ref 135–145)
Total Bilirubin: 0.9 mg/dL (ref 0.2–1.2)
Total Protein: 6.8 g/dL (ref 6.0–8.3)

## 2019-06-28 LAB — CBC WITH DIFFERENTIAL/PLATELET
Basophils Absolute: 0.1 10*3/uL (ref 0.0–0.1)
Basophils Relative: 0.9 % (ref 0.0–3.0)
Eosinophils Absolute: 0.1 10*3/uL (ref 0.0–0.7)
Eosinophils Relative: 2.2 % (ref 0.0–5.0)
HCT: 47.3 % (ref 39.0–52.0)
Hemoglobin: 16 g/dL (ref 13.0–17.0)
Lymphocytes Relative: 34.4 % (ref 12.0–46.0)
Lymphs Abs: 2.2 10*3/uL (ref 0.7–4.0)
MCHC: 33.7 g/dL (ref 30.0–36.0)
MCV: 88.7 fl (ref 78.0–100.0)
Monocytes Absolute: 0.5 10*3/uL (ref 0.1–1.0)
Monocytes Relative: 7.9 % (ref 3.0–12.0)
Neutro Abs: 3.5 10*3/uL (ref 1.4–7.7)
Neutrophils Relative %: 54.6 % (ref 43.0–77.0)
Platelets: 187 10*3/uL (ref 150.0–400.0)
RBC: 5.34 Mil/uL (ref 4.22–5.81)
RDW: 14.1 % (ref 11.5–15.5)
WBC: 6.3 10*3/uL (ref 4.0–10.5)

## 2019-06-28 LAB — LIPID PANEL
Cholesterol: 109 mg/dL (ref 0–200)
HDL: 41.7 mg/dL (ref >39.00)
LDL Cholesterol: 38 mg/dL (ref 0–99)
NonHDL: 67.6
Total CHOL/HDL Ratio: 3
Triglycerides: 147 mg/dL (ref 0.0–149.0)
VLDL: 29.4 mg/dL (ref 0.0–40.0)

## 2019-06-29 ENCOUNTER — Encounter: Payer: Self-pay | Admitting: Endocrinology

## 2019-06-29 ENCOUNTER — Telehealth: Payer: Self-pay | Admitting: Medical

## 2019-06-29 ENCOUNTER — Ambulatory Visit (INDEPENDENT_AMBULATORY_CARE_PROVIDER_SITE_OTHER): Payer: Medicare Other | Admitting: Endocrinology

## 2019-06-29 VITALS — BP 136/78 | HR 68 | Ht 63.0 in | Wt 202.8 lb

## 2019-06-29 DIAGNOSIS — E1165 Type 2 diabetes mellitus with hyperglycemia: Secondary | ICD-10-CM

## 2019-06-29 DIAGNOSIS — Z794 Long term (current) use of insulin: Secondary | ICD-10-CM

## 2019-06-29 LAB — POCT GLYCOSYLATED HEMOGLOBIN (HGB A1C): Hemoglobin A1C: 6.9 % — AB (ref 4.0–5.6)

## 2019-06-29 LAB — H. PYLORI BREATH TEST: H. pylori Breath Test: DETECTED — AB

## 2019-06-29 MED ORDER — AMOXICILLIN 500 MG PO CAPS: 1000.0000 mg | ORAL_CAPSULE | Freq: Two times a day (BID) | ORAL | 0 refills | Status: DC

## 2019-06-29 MED ORDER — CLARITHROMYCIN ER 500 MG PO TB24: 1000.0000 mg | ORAL_TABLET | Freq: Every day | ORAL | 0 refills | Status: DC

## 2019-06-29 MED ORDER — OMEPRAZOLE 20 MG PO CPDR: 20.0000 mg | DELAYED_RELEASE_CAPSULE | Freq: Every day | ORAL | 3 refills | Status: DC

## 2019-06-29 NOTE — Patient Instructions (Signed)
Take bolus before starting to eat

## 2019-06-29 NOTE — Telephone Encounter (Signed)
Opened to review 

## 2019-06-29 NOTE — Telephone Encounter (Signed)
Rx of meds sent for h pylori.

## 2019-06-29 NOTE — Progress Notes (Signed)
Patient ID: Samuel [name], male   DOB: Jul 04, 1951, 68 y.o.   MRN: [id number]            Reason for Appointment:  Followup for Type 2 Diabetes  Referring physician: Saguier  History of Present Illness:          Diagnosis: Type 2 diabetes mellitus, date of diagnosis:  1990      Past history: He thinks he has been on insulin for the last 10-12 years. Previously had been on metformin which has been continued. He was probably tried on different insulin regimens initially but has been taking 70/30 mostly because of cost Previous records are not available and appears that his sugars have been poorly controlled for several years He was  referred here because of an A1c in 8/15 of 9.7%. He was on a regimen of Novolin 70/30 twice a day but because of inadequate control and higher readings later in the day he was switched to basal bolus insulin regimen in 12/15. Previous insulin regimen: Humalog  ac meals 35-40-30 Toujeo 35-40 units daily  Recent history:   INSULIN regimen is: OMNIPOD INSULIN PUMP  Basal rate settings: Midnight = 1.3, 6am: 1.65, 5 PM = 1.6.  Boluses 12 units breakfast -14 units lunch and 18 units at dinner at meals Total daily insulin about 70 units Carbohydrate coverage 1:10 and correction 1:50   Non-insulin hypoglycemic drugs: metformin 1 g twice a day, Trulicity 1.5 mg weekly,Invokamet 50/1000  A1c has been variable, it is improved at 6.9 compared to 7.3   Current management, blood sugar patterns and problems identified:  He is getting progressively better control with his overall A1c results  However recently on an average his blood sugars look the same as before  Although he was told to check his sugars 4 times a day in order to get the freestyle Josephine Igolibre he currently has 3.5 readings per day  He has however lost 5 pounds since his last visit in May  No problems with taking Invokamet and Trulicity which he is taking regularly and is not complaining about the cost   He does have some issues with not bolusing consistently before each meal and may bolus after especially in the evening  Not clear if he is always eating 3 meals a day as he occasionally has only 1 or 2 boluses per day including yesterday  He has also occasionally taken a second bolus after eating causing his blood sugar to go down as low as 42 and not clear if this was because he forgot or he bolused when he saw his blood sugar being relatively higher  He is trying to walk regularly  However has not lost any weight        Side effects from medications have been: ?  Nausea/dizziness on Victoza  Compliance with the medical regimen: Fair   Glucose monitoring:  done 2-3 times a day         Glucometer:  Freestyle/Omnipod      Blood Glucose readings by download    PRE-MEAL Fasting Lunch Dinner Bedtime Overall  Glucose range:  95-154  102-210  108-200  100-224  42-224  Mean/median:      138   Previous readings:   PRE-MEAL Fasting Lunch Dinner Bedtime Overall  Glucose range:  111-139  73-146  135-177  103-237   Mean/median:      136       Self-care:  Variable portions, may have unbalanced meals  Meals: 3  meals per day. Breakfast at 8-9 am is various kinds of bread, milk and sometimes eggs. Lunch generally larger meal  Dinner at 6 pm     Dietician visit, most recent: 5/16 Last CDE visit: 12/2016            Weight history: Previous range 200-220  Wt Readings from Last 3 Encounters:  06/29/19 202 lb 12.8 oz (92 kg)  06/25/19 206 lb (93.4 kg)  03/23/19 207 lb (93.9 kg)    Glycemic control:   Lab Results  Component Value Date   HGBA1C 6.9 (A) 06/29/2019   HGBA1C 7.3 (H) 03/16/2019   HGBA1C 7.7 (H) 12/15/2018   Lab Results  Component Value Date   MICROALBUR <0.7 03/16/2019   LDLCALC 38 06/28/2019   CREATININE 0.81 06/28/2019    Lab Results  Component Value Date   FRUCTOSAMINE 302 (H) 07/20/2017       Allergies as of 06/29/2019   No Known Allergies      Medication List       Accurate as of June 29, 2019  2:21 PM. If you have any questions, ask your nurse or doctor.        amoxicillin 500 MG capsule Commonly known as: AMOXIL Take 2 capsules (1,000 mg total) by mouth 2 (two) times daily. Started by: Samuel RichtersEdward Saguier, PA-C   aspirin EC 81 MG tablet Take 81 mg by mouth at bedtime.   atorvastatin 40 MG tablet Commonly known as: LIPITOR TAKE 1 TABLET BY MOUTH DAILY AND 1/2 TABLET BY MOUTH EVERY NIGHT AT BEDTIME What changed:   how much to take  how to take this  when to take this   clarithromycin 500 MG 24 hr tablet Commonly known as: BIAXIN XL Take 2 tablets (1,000 mg total) by mouth daily. Started by: Samuel RichtersEdward Saguier, PA-C   diazepam 5 MG tablet Commonly known as: Valium Take 1 tablet (5 mg total) by mouth every 6 (six) hours as needed for muscle spasms.   FREESTYLE TEST STRIPS test strip Generic drug: glucose blood TEST AS DIRECTED FOUR TIMES DAILY   insulin lispro 100 UNIT/ML injection Commonly known as: HumaLOG Use up to 90 units daily via insulin pump.   Insulin Pen Needle 32G X 4 MM Misc Use 5 pens per day to inject insulin What changed: Another medication with the same name was changed. Make sure you understand how and when to take each.   B-D UF III MINI PEN NEEDLES 31G X 5 MM Misc Generic drug: Insulin Pen Needle USE FIVE PER DAY AS DIRECTED What changed: See the new instructions.   INSULIN SYRINGE .5CC/30GX5/16" 30G X 5/16" 0.5 ML Misc Use 3 times a day for insulin   Invokamet XR 50-1000 MG Tb24 Generic drug: Canagliflozin-metFORMIN HCl ER TAKE 2 TABLETS BY MOUTH EVERY DAY What changed:   how much to take  when to take this  additional instructions   losartan 50 MG tablet Commonly known as: COZAAR TAKE 1 TABLET(50 MG) BY MOUTH DAILY   meloxicam 15 MG tablet Commonly known as: MOBIC Take 1 tablet (15 mg total) by mouth daily.   NON FORMULARY Take 2 capsules by mouth 2 (two) times daily.  4LIFE TRANSFER FACTORY CARDIO   omeprazole 20 MG capsule Commonly known as: PRILOSEC Take 1 capsule (20 mg total) by mouth daily. Started by: Samuel RichtersEdward Saguier, PA-C   OmniPod 5 Pack Misc CHANGE EVERY 48 HOURS AS DIRECTED   OVER THE COUNTER MEDICATION Take 2 tablets by mouth 2 (  two) times daily. 4LIFE TRANSFER FACTORY RECALL   terbinafine 250 MG tablet Commonly known as: LAMISIL Take 1 tablet (250 mg total) by mouth daily.   tiZANidine 4 MG tablet Commonly known as: ZANAFLEX Take 1 tablet (4 mg total) by mouth every 8 (eight) hours as needed.   Trulicity 3.22 GU/5.4YH Sopn Generic drug: Dulaglutide ADMINISTER 0.5 ML(0.75 MG) UNDER THE SKIN 1 TIME A WEEK       Allergies: No Known Allergies  Past Medical History:  Diagnosis Date  . Blood transfusion without reported diagnosis   . Diabetes mellitus without complication ([place])    type II  . History of ETOH abuse   . Hypertension     Past Surgical History:  Procedure Laterality Date  . BACK SURGERY N/A 2010    Family History  Problem Relation Age of Onset  . Diabetes Unknown   . Hypertension Unknown   . Arthritis Father   . Diabetes Father     Social History:  reports that he has quit smoking. He quit smokeless tobacco use about 25 years ago. He reports that he does not drink alcohol or use drugs.    Review of Systems         Lipids:  He has been on Lipitor 40 mg for hypercholesterolemia since about 2011, lipid levels shows good control  Lipids followed by PCP, last levels are excellent        Lab Results  Component Value Date   CHOL 109 06/28/2019   HDL 41.70 06/28/2019   LDLCALC 38 06/28/2019   LDLDIRECT 40.0 02/13/2016   TRIG 147.0 06/28/2019   CHOLHDL 3 06/28/2019    Hypertension has been treated with losartan 50 mg, prescribed by PCP Also on Invokamet  Microalbumin normal as of 03/2019   Physical Examination:  BP 136/78 (BP Location: Left Arm, Patient Position: Sitting, Cuff Size: Normal)    Pulse 68   Ht 5\' 3"  (1.6 m)   Wt 202 lb 12.8 oz (92 kg)   SpO2 98%   BMI 35.92 kg/m      ASSESSMENT:  Diabetes type 2, with obesity and BMI of over 36  See history of present illness for  description of current diabetes management, blood sugar patterns and problems identified..  His diabetes is being managed with the Omnipod insulin pump, metformin and Trulicity 0.62 mg  His B7S is excellent at 6.9 now  He is fairly consistently checking his blood sugars at mealtimes, watching his diet and portions and also trying to walk Also likely benefiting from Invokamet and Trulicity  His day-to-day management was discussed with his insulin and data available from his pump download He is not always bolusing when he needs to and not adjusting his mealtime dose based on portion size    HYPERTENSION: Well-controlled with current regimen    PLAN:   As above Discussed needing to take his bolus before starting to eat consistently Also if he has a relatively high blood sugar he will use a correction factor to figure out his bolus and not entering carbohydrates again No basic changes made in his pump settings or medications Again reminded him to check his sugars consistently 4 times a day to be eligible for the freestyle libre    There are no Patient Instructions on file for this visit.   Elayne Snare 06/29/2019, 2:21 PM   Note: This office note was prepared with Dragon voice recognition system technology. Any transcriptional errors that result from this process are unintentional. +

## 2019-07-18 ENCOUNTER — Other Ambulatory Visit: Payer: Self-pay

## 2019-07-19 ENCOUNTER — Ambulatory Visit (HOSPITAL_BASED_OUTPATIENT_CLINIC_OR_DEPARTMENT_OTHER)
Admission: RE | Admit: 2019-07-19 | Discharge: 2019-07-19 | Disposition: A | Discharge status: Home / Self Care | Payer: Medicare Other | Source: Ambulatory Visit | Attending: Medical | Admitting: Medical

## 2019-07-19 ENCOUNTER — Ambulatory Visit (INDEPENDENT_AMBULATORY_CARE_PROVIDER_SITE_OTHER): Payer: Medicare Other | Admitting: Medical

## 2019-07-19 ENCOUNTER — Telehealth: Payer: Self-pay | Admitting: Medical

## 2019-07-19 ENCOUNTER — Encounter: Payer: Self-pay | Admitting: Medical

## 2019-07-19 VITALS — BP 110/70 | HR 62 | Temp 97.6°F | Resp 16 | Ht 63.0 in | Wt 204.6 lb

## 2019-07-19 DIAGNOSIS — Z23 Encounter for immunization: Secondary | ICD-10-CM | POA: Diagnosis not present

## 2019-07-19 DIAGNOSIS — M25561 Pain in right knee: Secondary | ICD-10-CM | POA: Diagnosis present

## 2019-07-19 DIAGNOSIS — G8929 Other chronic pain: Secondary | ICD-10-CM | POA: Diagnosis not present

## 2019-07-19 MED ORDER — TRAMADOL HCL 50 MG PO TABS: 50.0000 mg | ORAL_TABLET | Freq: Four times a day (QID) | ORAL | 0 refills | Status: AC | PRN

## 2019-07-19 MED ORDER — KETOROLAC TROMETHAMINE 30 MG/ML IJ SOLN
30.0000 mg | Freq: Once | INTRAMUSCULAR | Status: AC
  Administered 2019-07-19: 30 mg via INTRAMUSCULAR

## 2019-07-19 NOTE — Patient Instructions (Signed)
You do have bilateral knee pain that you described as chronic.  Much worse on right side than on the left.  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  After discussion we decided to just get x-ray of right knee today.  We gave you Toradol 30 mg IM injection today.  I think this will help with pain and if you need to be considered restart meloxicam after 24 hours Toradol use.  Also making a tablets of tramadol available to use for events of severe pain.  Rx advisement given.  We will evaluate your joint space and notify you of results.  If pain is persisting despite treatment then consider referral to specialist.  Follow-up date to be determined after x-ray review and after assessing clinical response to treatment.

## 2019-07-19 NOTE — Progress Notes (Signed)
Subjective:    Patient ID: Samuel [name], male    DOB: 1951-04-27, 68 y.o.   MRN: [id number]  HPI  Pt in with some recent rt knee pain. Pt states pain was minimal in past but just recently the pain has worsened. Past weeks the pain is worse.  Pt in past 2017 xray of rt knee showed.  Pt states minimal pain at times left knee.  IMPRESSION: 1. Mild bilateral osteoarthritic change without joint space loss. Small suprapatellar effusion on the right. 2. No acute fracture or dislocation.  Pt in past used meloxicam for back pain. He uses this recently for knee pain and helps little.   At times uses bengay but does not help.  Occasionally will have severe pain. Hurts to walk.  Pt pain today is about level 5/10.    Review of Systems  Constitutional: Negative for chills, fatigue and fever.  Respiratory: Negative for chest tightness, shortness of breath and wheezing.   Cardiovascular: Negative for chest pain and palpitations.  Gastrointestinal: Negative for abdominal pain.  Musculoskeletal: Negative for joint swelling.       Knee pain  Skin: Negative for rash.  Neurological: Negative for dizziness, speech difficulty and light-headedness.  Hematological: Negative for adenopathy. Does not bruise/bleed easily.  Psychiatric/Behavioral: Negative for behavioral problems and decreased concentration.    Past Medical History:  Diagnosis Date  . Blood transfusion without reported diagnosis   . Diabetes mellitus without complication ([place])    type II  . History of ETOH abuse   . Hypertension      Social History   Socioeconomic History  . Marital status: Married    Spouse name: Not on file  . Number of children: Not on file  . Years of education: Not on file  . Highest education level: Not on file  Occupational History  . Not on file  Social Needs  . Financial resource strain: Not on file  . Food insecurity    Worry: Not on file    Inability: Not on file  . Transportation  needs    Medical: Not on file    Non-medical: Not on file  Tobacco Use  . Smoking status: Former Research scientist (life sciences)  . Smokeless tobacco: Former Systems developer    Quit date: 11/08/1993  Substance and Sexual Activity  . Alcohol use: No    Alcohol/week: 0.0 standard drinks    Comment: occasional  . Drug use: No  . Sexual activity: Not on file  Lifestyle  . Physical activity    Days per week: Not on file    Minutes per session: Not on file  . Stress: Not on file  Relationships  . Social Herbalist on phone: Not on file    Gets together: Not on file    Attends religious service: Not on file    Active member of club or organization: Not on file    Attends meetings of clubs or organizations: Not on file    Relationship status: Not on file  . Intimate partner violence    Fear of current or ex partner: Not on file    Emotionally abused: Not on file    Physically abused: Not on file    Forced sexual activity: Not on file  Other Topics Concern  . Not on file  Social History Narrative  . Not on file    Past Surgical History:  Procedure Laterality Date  . BACK SURGERY N/A 2010    Family History  Problem Relation Age of Onset  . Diabetes Unknown   . Hypertension Unknown   . Arthritis Father   . Diabetes Father     No Known Allergies  Current Outpatient Medications on File Prior to Visit  Medication Sig Dispense Refill  . aspirin EC 81 MG tablet Take 81 mg by mouth at bedtime.    [name]. atorvastatin (LIPITOR) 40 MG tablet TAKE 1 TABLET BY MOUTH DAILY AND 1/2 TABLET BY MOUTH EVERY NIGHT AT BEDTIME (Patient taking differently: Take 40 mg by mouth at bedtime. TAKE 1 TABLET BY MOUTH DAILY AND 1/2 TABLET BY MOUTH EVERY NIGHT AT BEDTIME) 45 tablet 0  . clarithromycin (BIAXIN XL) 500 MG 24 hr tablet Take 2 tablets (1,000 mg total) by mouth daily. 20 tablet 0  . diazepam (VALIUM) 5 MG tablet Take 1 tablet (5 mg total) by mouth every 6 (six) hours as needed for muscle spasms. 40 tablet 0  . Insulin  Disposable Pump (OMNIPOD 5 PACK) MISC CHANGE EVERY 48 HOURS AS DIRECTED 45 each 2  . insulin lispro (HUMALOG) 100 UNIT/ML injection Use up to 90 units daily via insulin pump. 90 mL 3  . INVOKAMET XR 50-1000 MG TB24 TAKE 2 TABLETS BY MOUTH EVERY DAY (Patient taking differently: Take 1 tablet by mouth 2 (two) times a day. Take 1 tablet by mouth in the morning and 1 tablet in the afternoon.) 60 tablet 3  . losartan (COZAAR) 50 MG tablet TAKE 1 TABLET(50 MG) BY MOUTH DAILY 90 tablet 0  . meloxicam (MOBIC) 15 MG tablet Take 1 tablet (15 mg total) by mouth daily. 30 tablet 2  . NON FORMULARY Take 2 capsules by mouth 2 (two) times daily. 4LIFE TRANSFER FACTORY CARDIO    . omeprazole (PRILOSEC) 20 MG capsule Take 1 capsule (20 mg total) by mouth daily. 30 capsule 3  . TRULICITY 0.75 MG/0.5ML SOPN ADMINISTER 0.5 ML(0.75 MG) UNDER THE SKIN 1 TIME A WEEK 4 mL 2  . amoxicillin (AMOXIL) 500 MG capsule Take 2 capsules (1,000 mg total) by mouth 2 (two) times daily. (Patient not taking: Reported on 07/19/2019) 40 capsule 0  . B-D UF III MINI PEN NEEDLES 31G X 5 MM MISC USE FIVE PER DAY AS DIRECTED (Patient not taking: USE FIVE PER DAY AS DIRECTED) 200 each 0  . glucose blood (FREESTYLE TEST STRIPS) test strip TEST AS DIRECTED FOUR TIMES DAILY (Patient not taking: Reported on 07/19/2019) 100 strip 2  . Insulin Pen Needle 32G X 4 MM MISC Use 5 pens per day to inject insulin (Patient not taking: Reported on 07/19/2019) 200 each 3  . Insulin Syringe-Needle U-100 (INSULIN SYRINGE .5CC/30GX5/16") 30G X 5/16" 0.5 ML MISC Use 3 times a day for insulin (Patient not taking: Reported on 07/19/2019) 100 each 1  . OVER THE COUNTER MEDICATION Take 2 tablets by mouth 2 (two) times daily. 4LIFE TRANSFER FACTORY RECALL    . terbinafine (LAMISIL) 250 MG tablet Take 1 tablet (250 mg total) by mouth daily. (Patient not taking: Reported on 07/19/2019) 7 tablet 0  . tiZANidine (ZANAFLEX) 4 MG tablet Take 1 tablet (4 mg total) by mouth every 8  (eight) hours as needed. (Patient not taking: Reported on 07/19/2019) 60 tablet 1   No current facility-administered medications on file prior to visit.     BP 110/70 (BP Location: Left Arm, Patient Position: Sitting, Cuff Size: Large)   Pulse 62   Temp 97.6 F (36.4 C) (Temporal)   Resp 16   Ht  5\' 3"  (1.6 m)   Wt 204 lb 9.6 oz (92.8 kg)   SpO2 96%   BMI 36.24 kg/m       Objective:   Physical Exam  General- No acute distress. Pleasant patient. Neck- Full range of motion, no jvd Lungs- Clear, even and unlabored. Heart- regular rate and rhythm. Neurologic- CNII- XII grossly intact.  Rt knee- no obvious swelling or warmth. On flexion and extension moderate crepitus.  Left knee- no obvious swelling or warmth. On flexion and extension mild  crepitus.      Assessment & Plan:  You do have bilateral knee pain that you described as chronic.  Much worse on right side than on the left.  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  After discussion we decided to just get x-ray of right knee today.  We gave you Toradol 30 mg IM injection today.  I think this will help with pain and if you need to be considered restart meloxicam after 24 hours Toradol use.  Also making a tablets of tramadol available to use for events of severe pain.  Rx advisement given.  We will evaluate your joint space and notify you of results.  If pain is persisting despite treatment then consider referral to specialist.  Follow-up date to be determined after x-ray review and after assessing clinical response to treatment.  25 minutes spent with pt. 50% of time spent counseling pt on treatment plan going forward.  Esperanza Richters, PA-C

## 2019-07-19 NOTE — Telephone Encounter (Signed)
Future uric acid placed.

## 2019-07-19 NOTE — Addendum Note (Signed)
Addended by: Deveron Furlong D on: 07/19/2019 11:42 AM   Modules accepted: Orders

## 2019-07-20 ENCOUNTER — Other Ambulatory Visit: Payer: Self-pay | Admitting: Endocrinology

## 2019-07-24 ENCOUNTER — Other Ambulatory Visit (INDEPENDENT_AMBULATORY_CARE_PROVIDER_SITE_OTHER): Payer: Medicare Other

## 2019-07-24 ENCOUNTER — Other Ambulatory Visit: Payer: Self-pay

## 2019-07-24 DIAGNOSIS — G8929 Other chronic pain: Secondary | ICD-10-CM

## 2019-07-24 DIAGNOSIS — M25561 Pain in right knee: Secondary | ICD-10-CM | POA: Diagnosis not present

## 2019-07-24 LAB — URIC ACID: Uric Acid, Serum: 3.2 mg/dL — ABNORMAL LOW (ref 4.0–7.8)

## 2019-07-26 ENCOUNTER — Telehealth: Payer: Self-pay | Admitting: Medical

## 2019-07-26 DIAGNOSIS — G8929 Other chronic pain: Secondary | ICD-10-CM

## 2019-07-26 NOTE — Telephone Encounter (Signed)
Refer to orthopedist

## 2019-08-13 ENCOUNTER — Ambulatory Visit (INDEPENDENT_AMBULATORY_CARE_PROVIDER_SITE_OTHER): Payer: Medicare Other | Admitting: Medical

## 2019-08-13 ENCOUNTER — Encounter: Payer: Self-pay | Admitting: Medical

## 2019-08-13 ENCOUNTER — Other Ambulatory Visit: Payer: Self-pay

## 2019-08-13 VITALS — BP 127/66 | HR 64 | Temp 97.9°F | Resp 16 | Ht 63.0 in | Wt 204.0 lb

## 2019-08-13 DIAGNOSIS — Z794 Long term (current) use of insulin: Secondary | ICD-10-CM

## 2019-08-13 DIAGNOSIS — R350 Frequency of micturition: Secondary | ICD-10-CM

## 2019-08-13 DIAGNOSIS — Z125 Encounter for screening for malignant neoplasm of prostate: Secondary | ICD-10-CM

## 2019-08-13 DIAGNOSIS — E119 Type 2 diabetes mellitus without complications: Secondary | ICD-10-CM

## 2019-08-13 LAB — PSA: PSA: 0.38 ng/mL (ref 0.10–4.00)

## 2019-08-13 NOTE — Patient Instructions (Addendum)
For diabetes continue current medications and will refer you to optometrist for diabetic eye exam.  For hx of frequent urination will get psa and get urine culture.  Will follow studies. Frequent urination may be bph related vs diabetes.  Follow up date to be determined after lab review.

## 2019-08-13 NOTE — Progress Notes (Signed)
Subjective:    Patient ID: Samuel [name], male    DOB: Aug 24, 1951, 68 y.o.   MRN: [id number]  HPI   Pt in for evaluation.  Pt states actually he tried to only referral to eye specialist. He is spanish speaker and not sure if he talked to non spanish speaking staff.  They scheduled him for visit. Pt is diabetic and he is not sure when his last eye exam was. It was done through [facility].  Pt had labs done 2 months ago. But on review no psa done. He states urinates frequently during the day. Not much at night.    Review of Systems  Constitutional: Negative for chills, fatigue and fever.  Respiratory: Negative for chest tightness, shortness of breath and wheezing.   Cardiovascular: Negative for chest pain and palpitations.  Gastrointestinal: Negative for abdominal pain.  Genitourinary: Positive for frequency. Negative for decreased urine volume, difficulty urinating, dysuria, penile pain, scrotal swelling and testicular pain.       He states for one year has been urinating frequently. He attributed to diabetes. But even has this when sugars controlled.  Musculoskeletal: Negative for back pain.  Skin: Negative for rash.  Neurological: Negative for dizziness, speech difficulty, weakness and numbness.  Hematological: Negative for adenopathy.  Psychiatric/Behavioral: Negative for behavioral problems and decreased concentration.    Past Medical History:  Diagnosis Date  . Blood transfusion without reported diagnosis   . Diabetes mellitus without complication ([place])    type II  . History of ETOH abuse   . Hypertension      Social History   Socioeconomic History  . Marital status: Married    Spouse name: Not on file  . Number of children: Not on file  . Years of education: Not on file  . Highest education level: Not on file  Occupational History  . Not on file  Social Needs  . Financial resource strain: Not on file  . Food insecurity    Worry: Not on file    Inability: Not  on file  . Transportation needs    Medical: Not on file    Non-medical: Not on file  Tobacco Use  . Smoking status: Former Research scientist (life sciences)  . Smokeless tobacco: Former Systems developer    Quit date: 11/08/1993  Substance and Sexual Activity  . Alcohol use: No    Alcohol/week: 0.0 standard drinks    Comment: occasional  . Drug use: No  . Sexual activity: Not on file  Lifestyle  . Physical activity    Days per week: Not on file    Minutes per session: Not on file  . Stress: Not on file  Relationships  . Social Herbalist on phone: Not on file    Gets together: Not on file    Attends religious service: Not on file    Active member of club or organization: Not on file    Attends meetings of clubs or organizations: Not on file    Relationship status: Not on file  . Intimate partner violence    Fear of current or ex partner: Not on file    Emotionally abused: Not on file    Physically abused: Not on file    Forced sexual activity: Not on file  Other Topics Concern  . Not on file  Social History Narrative  . Not on file    Past Surgical History:  Procedure Laterality Date  . BACK SURGERY N/A 2010    Family  History  Problem Relation Age of Onset  . Diabetes Unknown   . Hypertension Unknown   . Arthritis Father   . Diabetes Father     No Known Allergies  Current Outpatient Medications on File Prior to Visit  Medication Sig Dispense Refill  . aspirin EC 81 MG tablet Take 81 mg by mouth at bedtime.    [name]. atorvastatin (LIPITOR) 40 MG tablet TAKE 1 TABLET BY MOUTH DAILY AND 1/2 TABLET BY MOUTH EVERY NIGHT AT BEDTIME (Patient taking differently: Take 40 mg by mouth at bedtime. TAKE 1 TABLET BY MOUTH DAILY AND 1/2 TABLET BY MOUTH EVERY NIGHT AT BEDTIME) 45 tablet 0  . B-D UF III MINI PEN NEEDLES 31G X 5 MM MISC USE FIVE PER DAY AS DIRECTED 200 each 0  . Canagliflozin-metFORMIN HCl ER (INVOKAMET XR) 50-1000 MG TB24 Take 1 tablet by mouth daily. 60 tablet 2  . diazepam (VALIUM) 5 MG  tablet Take 1 tablet (5 mg total) by mouth every 6 (six) hours as needed for muscle spasms. 40 tablet 0  . glucose blood (FREESTYLE TEST STRIPS) test strip TEST AS DIRECTED FOUR TIMES DAILY 100 strip 2  . Insulin Disposable Pump (OMNIPOD 5 PACK) MISC CHANGE EVERY 48 HOURS AS DIRECTED 45 each 2  . insulin lispro (HUMALOG) 100 UNIT/ML injection Use up to 90 units daily via insulin pump. 90 mL 3  . Insulin Pen Needle 32G X 4 MM MISC Use 5 pens per day to inject insulin 200 each 3  . Insulin Syringe-Needle U-100 (INSULIN SYRINGE .5CC/30GX5/16") 30G X 5/16" 0.5 ML MISC Use 3 times a day for insulin 100 each 1  . losartan (COZAAR) 50 MG tablet TAKE 1 TABLET(50 MG) BY MOUTH DAILY 90 tablet 0  . meloxicam (MOBIC) 15 MG tablet Take 1 tablet (15 mg total) by mouth daily. 30 tablet 2  . NON FORMULARY Take 2 capsules by mouth 2 (two) times daily. 4LIFE TRANSFER FACTORY CARDIO    . omeprazole (PRILOSEC) 20 MG capsule Take 1 capsule (20 mg total) by mouth daily. 30 capsule 3  . OVER THE COUNTER MEDICATION Take 2 tablets by mouth 2 (two) times daily. 4LIFE TRANSFER FACTORY RECALL    . terbinafine (LAMISIL) 250 MG tablet Take 1 tablet (250 mg total) by mouth daily. 7 tablet 0  . tiZANidine (ZANAFLEX) 4 MG tablet Take 1 tablet (4 mg total) by mouth every 8 (eight) hours as needed. 60 tablet 1  . TRULICITY 0.75 MG/0.5ML SOPN ADMINISTER 0.5 ML(0.75 MG) UNDER THE SKIN 1 TIME A WEEK 4 mL 2   No current facility-administered medications on file prior to visit.     BP 127/66 (BP Location: Left Arm, Patient Position: Sitting, Cuff Size: Large)   Pulse 64   Temp 97.9 F (36.6 C) (Oral)   Resp 16   Ht 5\' 3"  (1.6 m)   Wt 204 lb (92.5 kg)   SpO2 99%   BMI 36.14 kg/m       Objective:   Physical Exam  General- No acute distress. Pleasant patient. Neck- Full range of motion, no jvd Lungs- Clear, even and unlabored. Heart- regular rate and rhythm. Neurologic- CNII- XII grossly intact.  Abdomen- soft,  non-tender, non-distended.+ bs. No rebound or guarding.      Assessment & Plan:  For diabetes continue current medications and will refer you to optometrist for diabetic eye exam.  For hx of frequent urination will get psa and get urine culture. Will follow studies. Frequent urination  may be bph related vs diabetes.  Follow up date to be determined after lab review.  Esperanza Richters, PA-C

## 2019-08-14 ENCOUNTER — Other Ambulatory Visit: Payer: Self-pay | Admitting: *Deleted

## 2019-08-14 MED ORDER — LOSARTAN POTASSIUM 50 MG PO TABS: 50.0000 mg | ORAL_TABLET | Freq: Every day | ORAL | 1 refills | Status: DC

## 2019-08-15 LAB — URINE CULTURE
MICRO NUMBER:: 954112
SPECIMEN QUALITY:: ADEQUATE

## 2019-08-24 ENCOUNTER — Telehealth: Payer: Self-pay

## 2019-08-24 NOTE — Telephone Encounter (Signed)
Patients daughter called in stating that dad is taking the medication wrong Canagliflozin-metFORMIN HCl ER (INVOKAMET XR) 50-1000 MG . For insurance propose change it to 2 tablets instead of one   Please advise

## 2019-08-27 ENCOUNTER — Other Ambulatory Visit: Payer: Self-pay

## 2019-08-27 MED ORDER — INVOKAMET XR 50-1000 MG PO TB24: 1.0000 | ORAL_TABLET | Freq: Every day | ORAL | 2 refills | Status: DC

## 2019-08-27 NOTE — Telephone Encounter (Signed)
Rx sent 

## 2019-08-27 NOTE — Telephone Encounter (Signed)
Send 2 tablets daily, the last prescription was refilled incorrectly as 1 a day

## 2019-08-27 NOTE — Telephone Encounter (Signed)
Ok to change

## 2019-09-20 ENCOUNTER — Other Ambulatory Visit: Payer: Self-pay | Admitting: Medical

## 2019-09-20 MED ORDER — ATORVASTATIN CALCIUM 40 MG PO TABS: ORAL_TABLET | ORAL | 5 refills | Status: DC

## 2019-09-20 NOTE — Telephone Encounter (Signed)
Copied from [place] [id number]. Topic: Quick Communication - Rx Refill/Question >> Sep 20, 2019  1:52 PM Burchel, Abbi R wrote: Medication: atorvastatin (LIPITOR) 40 MG tablet  [employer] calling to request new rx for 90 day supply.   Preferred Pharmacy: [facility] [place], [place] - [place] AT [facility] OF [address] [place] [address] Phone: [phone] Fax: [phone]

## 2019-09-20 NOTE — Telephone Encounter (Signed)
Requested medication (s) are due for refill today: yes  Requested medication (s) are on the active medication list: yes  Last refill:  12/07/2017  Future visit scheduled: no  Notes to clinic:  [facility] requesting that a 90 day supply be sent in    Requested Prescriptions  Pending Prescriptions Disp Refills   atorvastatin (LIPITOR) 40 MG tablet 45 tablet 0    Sig: TAKE 1 TABLET BY MOUTH DAILY AND 1/2 TABLET BY MOUTH EVERY NIGHT AT BEDTIME     Cardiovascular:  Antilipid - Statins Passed - 09/20/2019  2:00 PM      Passed - Total Cholesterol in normal range and within 360 days    Cholesterol  Date Value Ref Range Status  06/28/2019 109 0 - 200 mg/dL Final    Comment:    ATP III Classification       Desirable:  < 200 mg/dL               Borderline High:  200 - 239 mg/dL          High:  > = 240 mg/dL         Passed - LDL in normal range and within 360 days    LDL Cholesterol  Date Value Ref Range Status  06/28/2019 38 0 - 99 mg/dL Final         Passed - HDL in normal range and within 360 days    HDL  Date Value Ref Range Status  06/28/2019 41.70 >39.00 mg/dL Final         Passed - Triglycerides in normal range and within 360 days    Triglycerides  Date Value Ref Range Status  06/28/2019 147.0 0.0 - 149.0 mg/dL Final    Comment:    Normal:  <150 mg/dLBorderline High:  150 - 199 mg/dL         Passed - Patient is not pregnant      Passed - Valid encounter within last 12 months    Recent Outpatient Visits          1 month ago Frequent urination   [facility] at [place], [place]   2 months ago Chronic pain of right knee   [facility] at [facility], [place], [place]   2 months ago Essential hypertension   [facility] at [facility], [place], [place]   5 months ago [facility], [place], [place]   5 months ago Tinea  corporis   [facility] at [place], [place]

## 2019-09-27 ENCOUNTER — Other Ambulatory Visit: Payer: Self-pay | Admitting: Endocrinology

## 2019-09-28 ENCOUNTER — Telehealth: Payer: Self-pay | Admitting: Dietician

## 2019-09-28 NOTE — Telephone Encounter (Signed)
Patient called and interpretor obtained ([place] [phone]). Patient stated that he checks his BG 5 times a day and needs a prescription for more strips. Chart reviewed.  Discussed that a prescription for 150 strips was sent to the [facility] in [place] today for the FreeStyle meter.  Patient confirmed that this was the proper meter and pharmacy.  Antonieta Iba, RD, LDN, CDE

## 2019-10-22 ENCOUNTER — Other Ambulatory Visit: Payer: Self-pay

## 2019-10-24 ENCOUNTER — Other Ambulatory Visit: Payer: Self-pay

## 2019-10-24 ENCOUNTER — Encounter: Payer: Self-pay | Admitting: Endocrinology

## 2019-10-24 ENCOUNTER — Ambulatory Visit (INDEPENDENT_AMBULATORY_CARE_PROVIDER_SITE_OTHER): Payer: Medicare Other | Admitting: Endocrinology

## 2019-10-24 VITALS — BP 112/60 | HR 87 | Ht 63.0 in | Wt 206.2 lb

## 2019-10-24 DIAGNOSIS — I10 Essential (primary) hypertension: Secondary | ICD-10-CM | POA: Diagnosis not present

## 2019-10-24 DIAGNOSIS — E1165 Type 2 diabetes mellitus with hyperglycemia: Secondary | ICD-10-CM

## 2019-10-24 DIAGNOSIS — E7800 Pure hypercholesterolemia, unspecified: Secondary | ICD-10-CM

## 2019-10-24 DIAGNOSIS — Z794 Long term (current) use of insulin: Secondary | ICD-10-CM | POA: Diagnosis not present

## 2019-10-24 MED ORDER — SYNJARDY XR 5-1000 MG PO TB24: 2.0000 | ORAL_TABLET | Freq: Every day | ORAL | 2 refills | Status: DC

## 2019-10-24 MED ORDER — TRULICITY 1.5 MG/0.5ML ~~LOC~~ SOAJ: SUBCUTANEOUS | 2 refills | Status: DC

## 2019-10-24 NOTE — Progress Notes (Signed)
Patient ID: Samuel SchwartzLuis O Mcneary, male   DOB: 05/25/1951, 68 y.o.   MRN: [id number]            Reason for Appointment:  Followup for Type 2 Diabetes  Referring physician: [place]  History of Present Illness:          Diagnosis: Type 2 diabetes mellitus, date of diagnosis:  1990      Past history: He thinks he has been on insulin for the last 10-12 years. Previously had been on metformin which has been continued. He was probably tried on different insulin regimens initially but has been taking 70/30 mostly because of cost Previous records are not available and appears that his sugars have been poorly controlled for several years He was  referred here because of an A1c in 8/15 of 9.7%. He was on a regimen of Novolin 70/30 twice a day but because of inadequate control and higher readings later in the day he was switched to basal bolus insulin regimen in 12/15. Previous insulin regimen: Humalog  ac meals 35-40-30 Toujeo 35-40 units daily  Recent history:   INSULIN regimen is: OMNIPOD INSULIN PUMP  Basal rate settings: Midnight = 1.3, 6am: 1.65, 5 PM = 1.6.  Boluses 12 units breakfast -14 units lunch and 18 units at dinner at meals Total daily insulin about 70 units Carbohydrate coverage 1:10 and correction 1:50   Non-insulin hypoglycemic drugs: metformin 1 g twice a day, Trulicity 1.5 mg weekly,Invokamet 50/1000  A1c has been variable, it is improved at 6.9 consistently now   Current management, blood sugar patterns and problems identified:  He is still getting some variability in his blood sugars with occasional high readings after his meals including after breakfast and sometimes after lunch  However he does not appear to be adjusting his mealtime bolus appropriately based on his meal size  He does know how to adjust his bolus supper down despite using preset boluses for meals  Appears that he also missed his bolus at lunch or dinner time periodically causing high readings  Fasting  readings are excellent and fairly stable  He said that he has not had as much satiety with Trulicity but for some reason he has been getting refills on the 0.75 mg instead of the 1.5 mg dose since earlier this year  He does some walking but is limited by knee pain  He does well with checking his blood sugars about 4 times a day        Side effects from medications have been: ?  Nausea/dizziness on Victoza  Compliance with the medical regimen: Fair   Glucose monitoring:  done 2-3 times a day         Glucometer:  Freestyle/Omnipod      Blood Glucose readings by download    PRE-MEAL Fasting Lunch Dinner Bedtime Overall  Glucose range:  98-139 -86-164  93-238  142-187  50-244  Mean/median:  120    166  145   POST-MEAL PC Breakfast PC Lunch PC Dinner  Glucose range:  90-190  58, 85  50-160  Mean/median:      PREVIOUS readings:  PRE-MEAL Fasting Lunch Dinner Bedtime Overall  Glucose range:  95-154  102-210  108-200  100-224  42-224  Mean/median:      138   Self-care:  Variable portions, may have unbalanced meals  Meals: 3 meals per day. Breakfast at 8-9 am is various kinds of bread, milk and sometimes eggs. Lunch generally larger meal  Dinner at  6 pm     Dietician visit, most recent: 5/16 Last CDE visit: 12/2016            Weight history: Previous range 200-220  Wt Readings from Last 3 Encounters:  10/24/19 206 lb 3.2 oz (93.5 kg)  08/13/19 204 lb (92.5 kg)  07/19/19 204 lb 9.6 oz (92.8 kg)    Glycemic control:   Lab Results  Component Value Date   HGBA1C 6.9 (A) 10/25/2019   HGBA1C 6.9 (A) 06/29/2019   HGBA1C 7.3 (H) 03/16/2019   Lab Results  Component Value Date   MICROALBUR <0.7 03/16/2019   LDLCALC 38 06/28/2019   CREATININE 0.81 06/28/2019    Lab Results  Component Value Date   FRUCTOSAMINE 302 (H) 07/20/2017       Allergies as of 10/24/2019   No Known Allergies     Medication List       Accurate as of October 24, 2019 11:59 PM. If you have  any questions, ask your nurse or doctor.        STOP taking these medications   Trulicity 0.75 MG/0.5ML Sopn Generic drug: Dulaglutide Replaced by: Trulicity 1.5 MG/0.5ML Sopn Stopped by: Reather Littler, MD     TAKE these medications   aspirin EC 81 MG tablet Take 81 mg by mouth at bedtime.   atorvastatin 40 MG tablet Commonly known as: LIPITOR TAKE 1 TABLET BY MOUTH DAILY AND 1/2 TABLET BY MOUTH EVERY NIGHT AT BEDTIME   diazepam 5 MG tablet Commonly known as: Valium Take 1 tablet (5 mg total) by mouth every 6 (six) hours as needed for muscle spasms.   FREESTYLE TEST STRIPS test strip Generic drug: glucose blood TEST AS DIRECTED FOUR TIMES DAILY   insulin lispro 100 UNIT/ML injection Commonly known as: HumaLOG Use up to 90 units daily via insulin pump.   Insulin Pen Needle 32G X 4 MM Misc Use 5 pens per day to inject insulin   B-D UF III MINI PEN NEEDLES 31G X 5 MM Misc Generic drug: Insulin Pen Needle USE FIVE PER DAY AS DIRECTED   INSULIN SYRINGE .5CC/30GX5/16" 30G X 5/16" 0.5 ML Misc Use 3 times a day for insulin   Invokamet XR 50-1000 MG Tb24 Generic drug: Canagliflozin-metFORMIN HCl ER Take 1 tablet by mouth daily. Take 2 tablets by mouth once daily.   losartan 50 MG tablet Commonly known as: COZAAR Take 1 tablet (50 mg total) by mouth daily.   meloxicam 15 MG tablet Commonly known as: MOBIC Take 1 tablet (15 mg total) by mouth daily.   NON FORMULARY Take 2 capsules by mouth 2 (two) times daily. 4LIFE TRANSFER FACTORY CARDIO   omeprazole 20 MG capsule Commonly known as: PRILOSEC Take 1 capsule (20 mg total) by mouth daily.   OmniPod 5 Pack Misc CHANGE EVERY 48 HOURS AS DIRECTED   OVER THE COUNTER MEDICATION Take 2 tablets by mouth 2 (two) times daily. 4LIFE TRANSFER FACTORY RECALL   Synjardy XR 03-999 MG Tb24 Generic drug: Empagliflozin-metFORMIN HCl ER Take 2 tablets by mouth daily. Started by: Reather Littler, MD   terbinafine 250 MG  tablet Commonly known as: LAMISIL Take 1 tablet (250 mg total) by mouth daily.   tiZANidine 4 MG tablet Commonly known as: ZANAFLEX Take 1 tablet (4 mg total) by mouth every 8 (eight) hours as needed.   Trulicity 1.5 MG/0.5ML Sopn Generic drug: Dulaglutide Inject weekly Replaces: Trulicity 0.75 MG/0.5ML Sopn Started by: Reather Littler, MD  Allergies: No Known Allergies  Past Medical History:  Diagnosis Date  . Blood transfusion without reported diagnosis   . Diabetes mellitus without complication ([place])    type II  . History of ETOH abuse   . Hypertension     Past Surgical History:  Procedure Laterality Date  . BACK SURGERY N/A 2010    Family History  Problem Relation Age of Onset  . Diabetes Unknown   . Hypertension Unknown   . Arthritis Father   . Diabetes Father     Social History:  reports that he has quit smoking. He quit smokeless tobacco use about 25 years ago. He reports that he does not drink alcohol or use drugs.    Review of Systems         Lipids:  He has been on Lipitor 40 mg for hypercholesterolemia since about 2011, lipid levels as below, has had good control  Lipids followed by PCP        Lab Results  Component Value Date   CHOL 109 06/28/2019   HDL 41.70 06/28/2019   LDLCALC 38 06/28/2019   LDLDIRECT 40.0 02/13/2016   TRIG 147.0 06/28/2019   CHOLHDL 3 06/28/2019    Hypertension has been treated with losartan 50 mg, prescribed by PCP Also on Invokamet  Mobic has been used only prn  Microalbumin normal as of 03/2019   Physical Examination:  BP 112/60 (BP Location: Left Arm, Patient Position: Sitting, Cuff Size: Normal)   Pulse 87   Ht 5\' 3"  (1.6 m)   Wt 206 lb 3.2 oz (93.5 kg)   SpO2 97%   BMI 36.53 kg/m      ASSESSMENT:  Diabetes type 2, with obesity and BMI of over 36  See history of present illness for  description of current diabetes management, blood sugar patterns and problems identified..  His diabetes is  being managed with the Omnipod insulin pump, metformin and Trulicity 8.84 mg  His Z6S is stable at 6.9 now  He is overall having a blood sugar average of 145 with monitoring 4 times a day However he does have variability in his nonfasting readings based on compliance with boluses before meals, higher type of meals and not adjusting his boluses based on carbohydrate counting and meal size Does have some tendency to high readings after breakfast this is not consistent  Currently has gained a couple pounds and does not find as much satiety with the 0.63 mg Trulicity dose His blood sugar patterns and pump management was reviewed in detail    HYPERTENSION: Well-controlled with current regimen    PLAN:   Discussed needing to bolus consistently He can adjust his boluses up or down by 2 to 3 units if he is eating larger or smaller meals are more carbohydrate As above Discussed needing to take his bolus before starting to eat consistently Again reminded him to monitor his blood sugar at the time of the meal bolus to allow correction We will increase his Trulicity to 1.5 mg weekly for better postprandial control and some weight loss  Since he checking his blood sugar 4 times daily he should be eligible for the freestyle libre but will need to show them how to get started  Since his insurance will not cover Invokamet he will switch to Synjardy XR  Follow-up in 3 months again  Patient Instructions  Take 2 shots of 0.16 TRULICITY same time and next dose will be 1.5mg  weekly  BOLUS BEFORE EATING EVERY MEAL  Reduce bolus 2-4 units for smaller meals or less Carbs  Tome 2 inyecciones de 0,75 TRULICITY al mismo tiempo y la prxima dosis ser de 1,5 mg por semana  BOLO ANTES DE COMER CADA COMIDA  Reduzca el bolo de 2 a 4 unidades para comidas ms pequeas o menos carbohidratos    Reather Littler 10/25/2019, 8:22 AM   Note: This office note was prepared with Dragon voice recognition system  technology. Any transcriptional errors that result from this process are unintentional. +

## 2019-10-24 NOTE — Patient Instructions (Signed)
Take 2 shots of 3.53 TRULICITY same time and next dose will be 1.5mg  weekly  BOLUS BEFORE EATING EVERY MEAL  Reduce bolus 2-4 units for smaller meals or less Carbs  Tome 2 inyecciones de 6,14 TRULICITY al mismo tiempo y la prxima dosis ser de 1,5 mg por semana  BOLO ANTES DE COMER CADA COMIDA  Reduzca el bolo de 2 a 4 unidades para comidas ms pequeas o menos carbohidratos

## 2019-10-25 LAB — POCT GLYCOSYLATED HEMOGLOBIN (HGB A1C): Hemoglobin A1C: 6.9 % — AB (ref 4.0–5.6)

## 2019-11-21 ENCOUNTER — Telehealth: Payer: Self-pay | Admitting: Endocrinology

## 2019-11-21 ENCOUNTER — Other Ambulatory Visit: Payer: Self-pay

## 2019-11-21 MED ORDER — SYNJARDY XR 5-1000 MG PO TB24: 2.0000 | ORAL_TABLET | Freq: Every day | ORAL | 2 refills | Status: DC

## 2019-11-21 NOTE — Telephone Encounter (Signed)
Patient requests to be called at ph# [phone]  re: an issue he is having with Canagliflozin-metFORMIN HCl ER (INVOKAMET XR) 50-1000 MG TB24

## 2019-11-21 NOTE — Telephone Encounter (Signed)
Synjardy XR 03/999, 2 tablets daily 

## 2019-11-21 NOTE — Telephone Encounter (Signed)
Called pt and he stated that his insurance will no longer cover Invokamet. In last office visit note, it was stated that Dr. Lucianne Muss would change pt to Synjardy XR. What dose of this would you like?

## 2019-11-21 NOTE — Telephone Encounter (Signed)
Rx sent 

## 2019-12-04 ENCOUNTER — Other Ambulatory Visit: Payer: Self-pay | Admitting: Endocrinology

## 2020-01-07 ENCOUNTER — Other Ambulatory Visit: Payer: Self-pay

## 2020-01-07 ENCOUNTER — Telehealth: Payer: Self-pay | Admitting: Endocrinology

## 2020-01-07 MED ORDER — SYNJARDY XR 5-1000 MG PO TB24: 2.0000 | ORAL_TABLET | Freq: Every day | ORAL | 2 refills | Status: DC

## 2020-01-07 NOTE — Telephone Encounter (Signed)
MEDICATION: Invokamet XR 50 1000 tablets  PHARMACY:   [facility] #[id number] - [place], [place] - [address] AT [facility] OF [facility] [address] Phone:  [phone]  Fax:  [phone]      IS THIS A 90 DAY SUPPLY : Yes  IS PATIENT OUT OF MEDICATION: Yes  IF NOT; HOW MUCH IS LEFT: 0  LAST APPOINTMENT DATE: @1 /26/2021  NEXT APPOINTMENT DATE:@3 /18/2021  DO WE HAVE YOUR PERMISSION TO LEAVE A DETAILED MESSAGE: Yes  OTHER COMMENTS:    **Let patient know to contact pharmacy at the end of the day to make sure medication is ready. **  ** Please notify patient to allow 48-72 hours to process**  **Encourage patient to contact the pharmacy for refills or they can request refills through [facility]**

## 2020-01-07 NOTE — Telephone Encounter (Signed)
Rx sent 

## 2020-01-23 ENCOUNTER — Other Ambulatory Visit: Payer: Self-pay

## 2020-01-24 ENCOUNTER — Ambulatory Visit (INDEPENDENT_AMBULATORY_CARE_PROVIDER_SITE_OTHER): Payer: Medicare Other | Admitting: Endocrinology

## 2020-01-24 ENCOUNTER — Encounter: Payer: Self-pay | Admitting: Endocrinology

## 2020-01-24 ENCOUNTER — Other Ambulatory Visit: Payer: Self-pay

## 2020-01-24 VITALS — BP 130/68 | HR 69 | Ht 63.0 in | Wt 206.6 lb

## 2020-01-24 DIAGNOSIS — E7800 Pure hypercholesterolemia, unspecified: Secondary | ICD-10-CM

## 2020-01-24 DIAGNOSIS — I10 Essential (primary) hypertension: Secondary | ICD-10-CM

## 2020-01-24 DIAGNOSIS — Z794 Long term (current) use of insulin: Secondary | ICD-10-CM

## 2020-01-24 DIAGNOSIS — E1165 Type 2 diabetes mellitus with hyperglycemia: Secondary | ICD-10-CM

## 2020-01-24 LAB — COMPREHENSIVE METABOLIC PANEL
ALT: 22 U/L (ref 0–53)
AST: 21 U/L (ref 0–37)
Albumin: 4.2 g/dL (ref 3.5–5.2)
Alkaline Phosphatase: 56 U/L (ref 39–117)
BUN: 18 mg/dL (ref 6–23)
CO2: 30 mEq/L (ref 19–32)
Calcium: 9.3 mg/dL (ref 8.4–10.5)
Chloride: 103 mEq/L (ref 96–112)
Creatinine, Ser: 0.98 mg/dL (ref 0.40–1.50)
GFR: 75.93 mL/min (ref >60.00)
Glucose, Bld: 185 mg/dL — ABNORMAL HIGH (ref 70–99)
Potassium: 4.1 mEq/L (ref 3.5–5.1)
Sodium: 139 mEq/L (ref 135–145)
Total Bilirubin: 0.5 mg/dL (ref 0.2–1.2)
Total Protein: 6.7 g/dL (ref 6.0–8.3)

## 2020-01-24 LAB — LIPID PANEL
Cholesterol: 96 mg/dL (ref 0–200)
HDL: 37.7 mg/dL — ABNORMAL LOW (ref >39.00)
NonHDL: 58.69
Total CHOL/HDL Ratio: 3
Triglycerides: 208 mg/dL — ABNORMAL HIGH (ref 0.0–149.0)
VLDL: 41.6 mg/dL — ABNORMAL HIGH (ref 0.0–40.0)

## 2020-01-24 LAB — MICROALBUMIN / CREATININE URINE RATIO
Creatinine,U: 44.8 mg/dL
Microalb Creat Ratio: 1.6 mg/g (ref 0.0–30.0)
Microalb, Ur: <0.7 mg/dL (ref 0.0–1.9)

## 2020-01-24 LAB — LDL CHOLESTEROL, DIRECT: Direct LDL: 44 mg/dL

## 2020-01-24 LAB — POCT GLYCOSYLATED HEMOGLOBIN (HGB A1C): Hemoglobin A1C: 7.1 % — AB (ref 4.0–5.6)

## 2020-01-24 NOTE — Progress Notes (Signed)
Patient ID: Samuel [name], male   DOB: 04-Apr-1951, 69 y.o.   MRN: [id number]            Reason for Appointment:  Followup for Type 2 Diabetes  Referring physician: S[place]ier  History of Present Illness:          Diagnosis: Type 2 diabetes mellitus, date of diagnosis:  1990      Past history: He thinks he has been on insulin for the last 10-12 years. Previously had been on metformin which has been continued. He was probably tried on different insulin regimens initially but has been taking 70/30 mostly because of cost Previous records are not available and appears that his sugars have been poorly controlled for several years He was  referred here because of an A1c in 8/15 of 9.7%. He was on a regimen of Novolin 70/30 twice a day but because of inadequate control and higher readings later in the day he was switched to basal bolus insulin regimen in 12/15. Previous insulin regimen: Humalog  ac meals 35-40-30 Toujeo 35-40 units daily  Recent history:   INSULIN regimen is: OMNIPOD INSULIN PUMP  Basal rate settings: Midnight = 1.3, 6am: 1.65, 5 PM = 1.6.  Boluses 12 units breakfast -14 units lunch and 18 units at dinner at meals Total daily insulin about 70 units  Carbohydrate coverage 1:10 and correction 1:50 Usual mealtime boluses: 12 units at breakfast, 14 at lunch and 18 at dinner   Non-insulin hypoglycemic drugs: metformin 1 g twice a day, Trulicity 1.5 mg weekly,Invokamet 50/1000  A1c has been variable, it is improved at 6.9 consistently now   Current management, blood sugar patterns and problems identified:  He is not having any better blood sugar control compared to the last time when his Trulicity was increased  His blood sugars are more variable in the afternoons and evenings likely based on his diet  Also as before he will sometimes take his evening mealtime bolus late  He is still mostly taking 18 units to cover his evening meals and cannot adjust his dose based on what  he is eating  Also may not always have mealtime adjustment based on his blood sugars and not clear why, may be overriding his pump  His pump has the wrong date and time  He had been traveling recently and did not use his pump for about 10 days until last night  Weight is about the same  Still able to continue checking his blood sugars about 4 times a day using his pump  His mealtimes are generally variable, evening meal 5-7 PM       Side effects from medications have been: ?  Nausea/dizziness on Victoza  Compliance with the medical regimen: Fair   Glucose monitoring:  done 2-3 times a day         Glucometer:  Freestyle/Omnipod      Blood Glucose readings by download    PRE-MEAL Fasting Lunch Dinner Bedtime Overall  Glucose range:  95-173   94-213  122-201   Mean/median:  129  151  175   149   POST-MEAL PC Breakfast PC Lunch PC Dinner  Glucose range:     Mean/median:   163  142   Previous readings:  PRE-MEAL Fasting Lunch Dinner Bedtime Overall  Glucose range:  98-139 -86-164  93-238  142-187  50-244  Mean/median:  120    166  145   POST-MEAL PC Breakfast PC Lunch PC Dinner  Glucose range:  90-190  58, 85  50-160  Mean/median:         Dietician visit, most recent: 5/16 Last CDE visit: 12/2016            Weight history: Previous range 200-220  Wt Readings from Last 3 Encounters:  01/24/20 206 lb 9.6 oz (93.7 kg)  10/24/19 206 lb 3.2 oz (93.5 kg)  08/13/19 204 lb (92.5 kg)    Glycemic control:   Lab Results  Component Value Date   HGBA1C 7.1 (A) 01/24/2020   HGBA1C 6.9 (A) 10/25/2019   HGBA1C 6.9 (A) 06/29/2019   Lab Results  Component Value Date   MICROALBUR <0.7 03/16/2019   LDLCALC 38 06/28/2019   CREATININE 0.81 06/28/2019    Lab Results  Component Value Date   FRUCTOSAMINE 302 (H) 07/20/2017       Allergies as of 01/24/2020   No Known Allergies     Medication List       Accurate as of January 24, 2020  1:42 PM. If you have any questions,  ask your nurse or doctor.        aspirin EC 81 MG tablet Take 81 mg by mouth at bedtime.   atorvastatin 40 MG tablet Commonly known as: LIPITOR TAKE 1 TABLET BY MOUTH DAILY AND 1/2 TABLET BY MOUTH EVERY NIGHT AT BEDTIME   diazepam 5 MG tablet Commonly known as: Valium Take 1 tablet (5 mg total) by mouth every 6 (six) hours as needed for muscle spasms.   FREESTYLE TEST STRIPS test strip Generic drug: glucose blood TEST AS DIRECTED FOUR TIMES DAILY   insulin lispro 100 UNIT/ML injection Commonly known as: HumaLOG Use up to 90 units daily via insulin pump.   Insulin Pen Needle 32G X 4 MM Misc Use 5 pens per day to inject insulin   B-D UF III MINI PEN NEEDLES 31G X 5 MM Misc Generic drug: Insulin Pen Needle USE FIVE PER DAY AS DIRECTED   INSULIN SYRINGE .5CC/30GX5/16" 30G X 5/16" 0.5 ML Misc Use 3 times a day for insulin   losartan 50 MG tablet Commonly known as: COZAAR Take 1 tablet (50 mg total) by mouth daily.   meloxicam 15 MG tablet Commonly known as: MOBIC Take 1 tablet (15 mg total) by mouth daily.   NON FORMULARY Take 2 capsules by mouth 2 (two) times daily. 4LIFE TRANSFER FACTORY CARDIO   omeprazole 20 MG capsule Commonly known as: PRILOSEC Take 1 capsule (20 mg total) by mouth daily.   OmniPod 5 Pack Misc CHANGE EVERY 48 HOURS AS DIRECTED   OVER THE COUNTER MEDICATION Take 2 tablets by mouth 2 (two) times daily. 4LIFE TRANSFER FACTORY RECALL   Synjardy XR 03-999 MG Tb24 Generic drug: Empagliflozin-metFORMIN HCl ER Take 2 tablets by mouth daily.   terbinafine 250 MG tablet Commonly known as: LAMISIL Take 1 tablet (250 mg total) by mouth daily.   tiZANidine 4 MG tablet Commonly known as: ZANAFLEX Take 1 tablet (4 mg total) by mouth every 8 (eight) hours as needed.   Trulicity 1.5 MW/4.1LK Sopn Generic drug: Dulaglutide Inject weekly       Allergies: No Known Allergies  Past Medical History:  Diagnosis Date  . Blood transfusion without  reported diagnosis   . Diabetes mellitus without complication ([place])    type II  . History of ETOH abuse   . Hypertension     Past Surgical History:  Procedure Laterality Date  . BACK SURGERY N/A 2010    Family  History  Problem Relation Age of Onset  . Diabetes Unknown   . Hypertension Unknown   . Arthritis Father   . Diabetes Father     Social History:  reports that he has quit smoking. He quit smokeless tobacco use about 26 years ago. He reports that he does not drink alcohol or use drugs.    Review of Systems         Lipids:  He has been on Lipitor 40 mg for hypercholesterolemia since about 2011, lipid levels as below, has had good control  Lipids followed by PCP        Lab Results  Component Value Date   CHOL 109 06/28/2019   HDL 41.70 06/28/2019   LDLCALC 38 06/28/2019   LDLDIRECT 40.0 02/13/2016   TRIG 147.0 06/28/2019   CHOLHDL 3 06/28/2019    Hypertension: Has been treated with losartan 50 mg, prescribed by PCP Also on Jardiance now  BP Readings from Last 3 Encounters:  01/24/20 130/68  10/24/19 112/60  08/13/19 127/66    Microalbumin normal as of 03/2019   Physical Examination:  BP 130/68   Pulse 69   Ht 5\' 3"  (1.6 m)   Wt 206 lb 9.6 oz (93.7 kg)   SpO2 96%   BMI 36.60 kg/m      Diabetic Foot Exam - Simple   Simple Foot Form Diabetic Foot exam was performed with the following findings: Yes   Visual Inspection No deformities, no ulcerations, no other skin breakdown bilaterally: Yes Sensation Testing Intact to touch and monofilament testing bilaterally: Yes Pulse Check Posterior Tibialis and Dorsalis pulse intact bilaterally: Yes Comments     ASSESSMENT:  Diabetes type 2, with obesity and BMI of around 36  See history of present illness for  description of current diabetes management, blood sugar patterns and problems identified..  His diabetes is being managed with the Omnipod insulin pump, Synjardy and Trulicity 1.5 mg  His  A1c is slightly higher at 7.1  As before he has some tendency to high postprandial readings in the afternoon and evening This is likely to be related to his being late for his boluses at times Also sometimes will have more carbohydrate and likely not adjusting his boluses based on what he is eating Although he is putting his blood sugars in the pump at the time of boluses he probably is overriding the correction factor and mostly taking the carbohydrate coverage only He is not able to understand although bolus settings should be adjusted Also his pump has the wrong date and time Blood sugar may be also higher the last 10 days with his not having his pump when traveling out of state  Still has difficulty losing weight and not completely benefiting from increasing Trulicity to 1.5  Discussed his blood sugar patterns, reasons for hyperglycemia and need for consistent bolusing before eating   HYPERTENSION: Consistently-controlled with current prescription for 50 mg losartan also likely benefiting from [place]    PLAN:   His pump was reprogrammed to the right date and time Will not change his mealtime boluses or basal settings as yet Again reminded him to adjust his evening dose higher if he is eating more carbohydrate To bolus before starting to eat even if he is not able to check his sugar No change in Trulicity or Synjardy Try to walk as much as possible  May need to have him see the diabetes educator to review his management and also do explained to him how  he can benefit from CGM also  There are no Patient Instructions on file for this visit.   Reather Littler 01/24/2020, 1:42 PM   Note: This office note was prepared with Dragon voice recognition system technology. Any transcriptional errors that result from this process are unintentional.

## 2020-01-24 NOTE — Addendum Note (Signed)
Addended by: Adline Mango I on: 01/24/2020 01:49 PM   Modules accepted: Orders

## 2020-01-29 ENCOUNTER — Telehealth: Payer: Self-pay

## 2020-01-29 NOTE — Telephone Encounter (Signed)
He needs to either check his blood pressure at home or with his PCP or come here.  To let us know if it is low.  Samuel [name] is not a new medication

## 2020-01-29 NOTE — Telephone Encounter (Signed)
Called pt and advised him of this. PT verbalized understanding and he is scheduled for nurse visit to check BP tomorrow am at 10am.

## 2020-01-29 NOTE — Telephone Encounter (Signed)
Received fax from after hours nurse line stating that pt's daughter called and stated that the pt has been having episodes of dizziness after taking Synjardy.

## 2020-01-30 ENCOUNTER — Telehealth: Payer: Self-pay | Admitting: Endocrinology

## 2020-01-30 ENCOUNTER — Other Ambulatory Visit: Payer: Self-pay

## 2020-01-30 ENCOUNTER — Ambulatory Visit: Payer: Medicare Other

## 2020-01-30 VITALS — BP 124/70 | HR 64

## 2020-01-30 DIAGNOSIS — E7800 Pure hypercholesterolemia, unspecified: Secondary | ICD-10-CM

## 2020-01-30 DIAGNOSIS — E1165 Type 2 diabetes mellitus with hyperglycemia: Secondary | ICD-10-CM

## 2020-01-30 DIAGNOSIS — I10 Essential (primary) hypertension: Secondary | ICD-10-CM

## 2020-01-30 NOTE — Telephone Encounter (Signed)
Per Dr Lucianne Muss patient to came in for blood pressure check.  Blood pressure was noted to be...124/70 sitting with large cuff.    Patient states that he started [place] and his head started feeling funny.  He took the medication for 2 weeks and stopped the medication before his last appointment and failed to mention it to Dr Lucianne Muss.  Patient states that he has not taking the Synjardia in 1 week and his head is feeling better.      Please call patient and tell him to restart Synjardy as before but stop the losartan so that blood pressure will not go down

## 2020-01-30 NOTE — Progress Notes (Signed)
Per Dr Lucianne Muss patient to came in for blood pressure check.  Blood pressure was noted to be...124/70 sitting with large cuff.    Patient states that he started [place] and his head started feeling funny.  He took the medication for 2 weeks and stopped the medication before his last appointment and failed to mention it to Dr Lucianne Muss.  Patient states that he has not taking the Synjardia in 1 week and his head is feeling better.

## 2020-01-30 NOTE — Telephone Encounter (Signed)
Called pt and gave him MD message. Pt verbalized understanding. 

## 2020-02-24 ENCOUNTER — Other Ambulatory Visit: Payer: Self-pay | Admitting: Endocrinology

## 2020-02-25 ENCOUNTER — Other Ambulatory Visit: Payer: Self-pay

## 2020-02-25 MED ORDER — FREESTYLE TEST VI STRP: ORAL_STRIP | 2 refills | Status: DC

## 2020-02-26 ENCOUNTER — Other Ambulatory Visit: Payer: Self-pay | Admitting: Endocrinology

## 2020-04-08 ENCOUNTER — Ambulatory Visit (HOSPITAL_BASED_OUTPATIENT_CLINIC_OR_DEPARTMENT_OTHER)
Admission: RE | Admit: 2020-04-08 | Discharge: 2020-04-08 | Disposition: A | Discharge status: Home / Self Care | Payer: Medicare Other | Source: Ambulatory Visit | Attending: Medical | Admitting: Medical

## 2020-04-08 ENCOUNTER — Ambulatory Visit (INDEPENDENT_AMBULATORY_CARE_PROVIDER_SITE_OTHER): Payer: Medicare Other | Admitting: Medical

## 2020-04-08 ENCOUNTER — Encounter: Payer: Self-pay | Admitting: Medical

## 2020-04-08 ENCOUNTER — Other Ambulatory Visit: Payer: Self-pay

## 2020-04-08 VITALS — BP 120/70 | HR 67 | Temp 97.2°F | Resp 18 | Ht 63.0 in | Wt 204.8 lb

## 2020-04-08 DIAGNOSIS — G8929 Other chronic pain: Secondary | ICD-10-CM

## 2020-04-08 DIAGNOSIS — M25559 Pain in unspecified hip: Secondary | ICD-10-CM | POA: Insufficient documentation

## 2020-04-08 DIAGNOSIS — M25569 Pain in unspecified knee: Secondary | ICD-10-CM | POA: Diagnosis present

## 2020-04-08 MED ORDER — KETOROLAC TROMETHAMINE 60 MG/2ML IM SOLN
60.0000 mg | Freq: Once | INTRAMUSCULAR | Status: AC
  Administered 2020-04-08: 60 mg via INTRAMUSCULAR

## 2020-04-08 NOTE — Patient Instructions (Addendum)
For hip and knee pain will get xray of both. Will give toradol 30 mg im today. Can start meloxicam tomorrow if needed but wait 24 hours since you got toradol today.  Follow xrays and may refer you to sports medicine for evaluation/treatment.  Follow up date to be determined after xray review.

## 2020-04-08 NOTE — Progress Notes (Signed)
   Subjective:    Patient ID: Samuel [name], male    DOB: [birth year], 69 y.o.   MRN: [id number]  HPI  Pt with left knee pain and some left hip area pain. Pt states pain is moderate to severe. He points to lateral aspect of thigh.   Pt states pain is more present when walking and standing. Pain level 5/10. About 4 months.  Not reporting mid low back pain. Though has known lumbar finding on mri.   2019. IMPRESSION: 1. Status post L3-4 PLIF.  Stable grade 1 L3-4 anterolisthesis. 2. 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 3. 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 Multilevel neural foraminal narrowing: Moderate on the LEFT at L2-3.  Pt had surgery with Dr. Danielle Dess. He really does not report direct back pain.    Pt has had left hip xray in 2017.   EXAM: DG HIP (WITH OR WITHOUT PELVIS) 2-3V LEFT  COMPARISON:  None.  FINDINGS: Mild degenerative changes of the left hip joint is seen. No acute fracture or dislocation is noted. No soft tissue abnormality is seen.  IMPRESSION: No acute abnormality noted.  Pt is taking meloxicam for pain. Pt has not used med today.      Review of Systems  Constitutional: Negative for chills, fatigue and fever.  Respiratory: Negative for cough, chest tightness, shortness of breath and wheezing.   Cardiovascular: Negative for chest pain and palpitations.  Gastrointestinal: Negative for abdominal pain.  Endocrine: Negative for polydipsia, polyphagia and polyuria.  Musculoskeletal:       See hpi.  Neurological: Negative for dizziness and headaches.  Hematological: Negative for adenopathy. Does not bruise/bleed easily.  Psychiatric/Behavioral: Negative for behavioral problems.       Objective:   Physical Exam  General- No acute distress. Pleasant patient. Neck- Full range of motion, no jvd Lungs- Clear, even and unlabored. Heart- regular rate and rhythm. Neurologic- CNII- XII grossly intact.  Left hip- pain  hip on rom and direct palpation. Left knee- pain on flexion and extension. No crepitus.      Assessment & Plan:  For hip and knee pain will get xray of both. Will give toradol 30 mg im today. Can start meloxicam tomorrow if needed but wait 24 hours since you got toradol today.  Follow xrays and may refer you to sports medicine for evaluation/treatment.  Time spent with patient today was 25  minutes which consisted of chart revdiew, discussing diagnosis, work up treatment and documentation.  Follow up date to be determined after xray review.

## 2020-04-24 ENCOUNTER — Ambulatory Visit: Payer: Medicare Other | Admitting: Endocrinology

## 2020-04-24 ENCOUNTER — Encounter: Payer: Self-pay | Admitting: Endocrinology

## 2020-04-24 ENCOUNTER — Other Ambulatory Visit: Payer: Self-pay

## 2020-04-24 VITALS — BP 110/60 | HR 73 | Ht 63.0 in | Wt 201.4 lb

## 2020-04-24 DIAGNOSIS — Z794 Long term (current) use of insulin: Secondary | ICD-10-CM | POA: Diagnosis not present

## 2020-04-24 DIAGNOSIS — E1165 Type 2 diabetes mellitus with hyperglycemia: Secondary | ICD-10-CM

## 2020-04-24 DIAGNOSIS — I10 Essential (primary) hypertension: Secondary | ICD-10-CM | POA: Diagnosis not present

## 2020-04-24 LAB — POCT GLYCOSYLATED HEMOGLOBIN (HGB A1C): Hemoglobin A1C: 7.2 % — AB (ref 4.0–5.6)

## 2020-04-24 NOTE — Progress Notes (Signed)
Patient ID: Samuel [name], male   DOB: [birth year], 69 y.o.   MRN: [id number]            Reason for Appointment:  Followup for Type 2 Diabetes  Referring physician: Saguier  History of Present Illness:          Diagnosis: Type 2 diabetes mellitus, date of diagnosis:  1990      Past history: He thinks he has been on insulin for the last 10-12 years. Previously had been on metformin which has been continued. He was probably tried on different insulin regimens initially but has been taking 70/30 mostly because of cost Previous records are not available and appears that his sugars have been poorly controlled for several years He was  referred here because of an A1c in 8/15 of 9.7%. He was on a regimen of Novolin 70/30 twice a day but because of inadequate control and higher readings later in the day he was switched to basal bolus insulin regimen in 12/15. Previous insulin regimen: Humalog  ac meals 35-40-30 Toujeo 35-40 units daily  Recent history:   INSULIN regimen is: OMNIPOD INSULIN PUMP  Basal rate settings: Midnight = 1.3, 6am: 1.65, 5 PM = 1.6.  Boluses 12 units breakfast -14 units lunch and 18 units at dinner at meals Total daily insulin up to 70 units  Carbohydrate coverage 1:10 and correction 1:50 Usual mealtime boluses: 12 units at breakfast, 14 at lunch and 18 at dinner   Non-insulin hypoglycemic drugs: metformin 1 g twice a day, Trulicity 1.5 mg weekly, Synjardy XR 03/999, 2 tablets daily  50/1000  A1c is now 7.2, slightly higher  Current basal insulin about 36 units and bolus recently 20 units/day  Current management, blood sugar patterns and problems identified:  He is not bolusing as directed and is somewhat confused about how to put in a bolus  He is frequently not bolusing at breakfast time even though he has had carbohydrates in the morning  Also yesterday evening he had rice and tortillas and did not bolus  Sometimes he is bolusing 16 units late at night  when his blood sugar is over 200 and has had couple of episodes of low sugars during the night pain  FASTING blood sugars are generally excellent  He is fairly regular with his Trulicity and Synjardy  His blood sugar was nonfasting likely depending on whether he is bolusing but relatively higher in the evenings at dinnertime  However not clear if some of his evening readings are after eating  No hypoglycemia during the day  His blood sugars may not be always high at bedtime depending on his diet  His weight is slightly better again  He is trying to walk as much as he can tolerate  Still able to continue checking his blood sugars up to 4 times a day using his pump  His mealtimes are generally variable, evening meal 5-7 PM       Side effects from medications have been: ?  Nausea/dizziness on Victoza  Compliance with the medical regimen: Fair   Glucose monitoring:  done 2-3 times a day         Glucometer:  Freestyle/Omnipod      Blood Glucose readings by download    PRE-MEAL Fasting Lunch Dinner Bedtime Overall  Glucose range:  84-145  86-188  175-233  108-223   Mean/median:  126   189  166  145   POST-MEAL PC Breakfast PC Lunch PC Dinner  Glucose  range:     Mean/median:      Previous readings:  PRE-MEAL Fasting Lunch Dinner Bedtime Overall  Glucose range:  95-173   94-213  122-201   Mean/median:  129  151  175   149   POST-MEAL PC Breakfast PC Lunch PC Dinner  Glucose range:     Mean/median:   163  142       Dietician visit, most recent: 5/16 Last CDE visit: 12/2016            Weight history: Previous range 200-220  Wt Readings from Last 3 Encounters:  04/24/20 201 lb 6.4 oz (91.4 kg)  04/08/20 204 lb 12.8 oz (92.9 kg)  01/24/20 206 lb 9.6 oz (93.7 kg)    Glycemic control:   Lab Results  Component Value Date   HGBA1C 7.2 (A) 04/24/2020   HGBA1C 7.1 (A) 01/24/2020   HGBA1C 6.9 (A) 10/25/2019   Lab Results  Component Value Date   MICROALBUR <0.7  01/24/2020   LDLCALC 38 06/28/2019   CREATININE 0.98 01/24/2020    Lab Results  Component Value Date   FRUCTOSAMINE 302 (H) 07/20/2017       Allergies as of 04/24/2020   No Known Allergies     Medication List       Accurate as of April 24, 2020 11:59 PM. If you have any questions, ask your nurse or doctor.        aspirin EC 81 MG tablet Take 81 mg by mouth at bedtime.   atorvastatin 40 MG tablet Commonly known as: LIPITOR TAKE 1 TABLET BY MOUTH DAILY AND 1/2 TABLET BY MOUTH EVERY NIGHT AT BEDTIME   diazepam 5 MG tablet Commonly known as: Valium Take 1 tablet (5 mg total) by mouth every 6 (six) hours as needed for muscle spasms.   FREESTYLE TEST STRIPS test strip Generic drug: glucose blood USE AS INSTRUCTED FOUR TIMES DAILY   insulin lispro 100 UNIT/ML injection Commonly known as: HumaLOG Use up to 90 units daily via insulin pump.   Insulin Pen Needle 32G X 4 MM Misc Use 5 pens per day to inject insulin   B-D UF III MINI PEN NEEDLES 31G X 5 MM Misc Generic drug: Insulin Pen Needle USE FIVE PER DAY AS DIRECTED   INSULIN SYRINGE .5CC/30GX5/16" 30G X 5/16" 0.5 ML Misc Use 3 times a day for insulin   losartan 50 MG tablet Commonly known as: COZAAR Take 1 tablet (50 mg total) by mouth daily.   meloxicam 15 MG tablet Commonly known as: MOBIC Take 1 tablet (15 mg total) by mouth daily.   NON FORMULARY Take 2 capsules by mouth 2 (two) times daily. 4LIFE TRANSFER FACTORY CARDIO   omeprazole 20 MG capsule Commonly known as: PRILOSEC Take 1 capsule (20 mg total) by mouth daily.   OmniPod 5 Pack Misc CHANGE EVERY 48 HOURS AS DIRECTED   OVER THE COUNTER MEDICATION Take 2 tablets by mouth 2 (two) times daily. 4LIFE TRANSFER FACTORY RECALL   Synjardy XR 03-999 MG Tb24 Generic drug: Empagliflozin-metFORMIN HCl ER Take 2 tablets by mouth daily.   terbinafine 250 MG tablet Commonly known as: LAMISIL Take 1 tablet (250 mg total) by mouth daily.   tiZANidine  4 MG tablet Commonly known as: ZANAFLEX Take 1 tablet (4 mg total) by mouth every 8 (eight) hours as needed.   Trulicity 1.5 MG/0.5ML Sopn Generic drug: Dulaglutide INJECT 1.5 MG AS DIRECTED ONCE WEEKLY       Allergies: No  Known Allergies  Past Medical History:  Diagnosis Date  . Blood transfusion without reported diagnosis   . Diabetes mellitus without complication (HCC)    type II  . History of ETOH abuse   . Hypertension     Past Surgical History:  Procedure Laterality Date  . BACK SURGERY N/A 2010    Family History  Problem Relation Age of Onset  . Diabetes Unknown   . Hypertension Unknown   . Arthritis Father   . Diabetes Father     Social History:  reports that he has quit smoking. He quit smokeless tobacco use about 26 years ago. He reports that he does not drink alcohol and does not use drugs.    Review of Systems         Lipids:  He has been on Lipitor 40 mg for hypercholesterolemia since about 2011, lipid levels as below, has had good control  Lipids followed by PCP        Lab Results  Component Value Date   CHOL 96 01/24/2020   HDL 37.70 (L) 01/24/2020   LDLCALC 38 06/28/2019   LDLDIRECT 44.0 01/24/2020   TRIG 208.0 (H) 01/24/2020   CHOLHDL 3 01/24/2020    Hypertension: Has been treated with losartan 50 mg, prescribed by PCP Also on Jardiance However he says that he is not taking his losartan much and will sometimes take it using 1/2 tablet.  He is only taking it when he does not feel right, not clear if he is having any dizziness when he takes it regularly  BP Readings from Last 3 Encounters:  04/24/20 110/60  04/08/20 120/70  01/30/20 124/70    Microalbumin normal as of 03/2019   Physical Examination:  BP 110/60 (BP Location: Left Arm, Patient Position: Sitting, Cuff Size: Large)   Pulse 73   Ht 5\' 3"  (1.6 m)   Wt 201 lb 6.4 oz (91.4 kg)   SpO2 96%   BMI 35.68 kg/m   Repeat blood pressure standing 118/62 No pedal edema      ASSESSMENT:  Diabetes type 2, with obesity and BMI of around 36  See history of present illness for  description of current diabetes management, blood sugar patterns and problems identified..  His diabetes is being managed with the Omnipod insulin pump, Synjardy and Trulicity 1.5 mg  His A1c is reasonably good at 7.2  His blood sugars are variable Again he is not consistently bolusing for his meals and does not still understand when and how to bolus In the last week he is mostly bolusing after eating when the blood sugar goes up He does not understand the need to cover all carbohydrates with boluses before eating However still likely benefiting from as his A1c is still fairly good despite several high readings Hypoglycemia mostly when he boluses excessively late at night   HYPERTENSION: His blood pressure is low normal With gradual weight loss and using Synjardy he does not appear to be needing the losartan which he likely is getting side effects from and is only taking half tablet as needed     PLAN:   His bolus technique was reviewed Reminded him that he can bolus at the mealtime even if he has not checked his sugar He will need to bolus before every meal that has carbohydrate including breakfast At the most he can reduce the bolus at mealtimes if he is eating smaller meals or blood sugar is low normal before eating Not to  bolus more than 10 units at bedtime when blood sugars are significantly higher to avoid overnight hypoglycemia May take up to 18 units at dinnertime for larger meals  Will need to have him see the diabetes educator to review his management on the next visit In the meantime he can call the Om[employer] company about how to set up his boluses and entering the bolus based on carbohydrates  Leave off losartan and start checking blood pressure at home  Patient Instructions  Take BOLUS AT EVERY MEAL at start of meal  Not over 10 units at  bedtime  Stop Losartan and check BP weekly  Check blood sugars on waking up 5 days a week and at each meal  Also check blood sugars about 2 hours after meals and do this after different meals by rotation  Recommended blood sugar levels on waking up are 90-130 and about 2 hours after meal is 130-160  Please bring your blood sugar monitor to each visit, thank you  Tome BOLO EN CADA COMIDA al comienzo de la comida.  No ms de 10 unidades a la hora de Passenger transport manager losartn y Health visitor presin arterial semanalmente  Controle el Production assistant, radio al despertarse 5 das a la semana y en cada comida  Tambin controle el azcar en sangre aproximadamente 2 horas despus de las comidas y hgalo despus de diferentes comidas por rotacin.  Los niveles de azcar en sangre recomendados al despertar son 4-130 y aproximadamente 2 horas despus de la comida son 130-160  Elsworth Soho su monitor de Production assistant, radio a cada visita, Johny Blamer 04/25/2020, 8:32 AM   Note: This office note was prepared with Dragon voice recognition system technology. Any transcriptional errors that result from this process are unintentional.

## 2020-04-24 NOTE — Patient Instructions (Addendum)
Take BOLUS AT EVERY MEAL at start of meal  Not over 10 units at bedtime  Stop Losartan and check BP weekly  Check blood sugars on waking up 5 days a week and at each meal  Also check blood sugars about 2 hours after meals and do this after different meals by rotation  Recommended blood sugar levels on waking up are 90-130 and about 2 hours after meal is 130-160  Please bring your blood sugar monitor to each visit, thank you  Tome BOLO EN CADA COMIDA al comienzo de la comida.  No ms de 10 unidades a la hora de Passenger transport manager losartn y Health visitor presin arterial semanalmente  Controle el Production assistant, radio al despertarse 5 das a la semana y en cada comida  Tambin controle el azcar en sangre aproximadamente 2 horas despus de las comidas y hgalo despus de diferentes comidas por rotacin.  Los niveles de azcar en sangre recomendados al despertar son 38-130 y aproximadamente 2 horas despus de la comida son 130-160  Elsworth Soho su monitor de Production assistant, radio a cada visita, gracias

## 2020-05-15 ENCOUNTER — Other Ambulatory Visit: Payer: Self-pay | Admitting: Endocrinology

## 2020-07-10 ENCOUNTER — Other Ambulatory Visit: Payer: Self-pay | Admitting: Endocrinology

## 2020-07-10 ENCOUNTER — Other Ambulatory Visit: Payer: Self-pay | Admitting: Medical

## 2020-07-22 ENCOUNTER — Ambulatory Visit (INDEPENDENT_AMBULATORY_CARE_PROVIDER_SITE_OTHER): Payer: Medicare Other | Admitting: Medical

## 2020-07-22 ENCOUNTER — Other Ambulatory Visit: Payer: Self-pay

## 2020-07-22 VITALS — BP 132/65 | HR 66 | Temp 98.5°F | Resp 16 | Wt 204.4 lb

## 2020-07-22 DIAGNOSIS — E119 Type 2 diabetes mellitus without complications: Secondary | ICD-10-CM | POA: Diagnosis not present

## 2020-07-22 DIAGNOSIS — M545 Low back pain, unspecified: Secondary | ICD-10-CM

## 2020-07-22 DIAGNOSIS — E785 Hyperlipidemia, unspecified: Secondary | ICD-10-CM

## 2020-07-22 DIAGNOSIS — M5410 Radiculopathy, site unspecified: Secondary | ICD-10-CM

## 2020-07-22 DIAGNOSIS — Z794 Long term (current) use of insulin: Secondary | ICD-10-CM

## 2020-07-22 MED ORDER — GABAPENTIN 100 MG PO CAPS: 100.0000 mg | ORAL_CAPSULE | Freq: Three times a day (TID) | ORAL | 3 refills | Status: DC

## 2020-07-22 NOTE — Patient Instructions (Addendum)
You have back pain with radicular pain. Sounds like source could be from your back but recent mri came back negative for cause of pain per your report. I can't find neurosurgeon office note or recent mri done 2 weeks ago. Please sign release form so can get last note and mri.  Neurosurgeon mentioned possible diabetic neuropathy pain. Will rx low dose gabapentin. Will titrate up to three times daily as we discussed.   cmp and lipid panel future placed for diabetes and high cholesterol  Follow up in 3 weeks or as needed.

## 2020-07-22 NOTE — Progress Notes (Signed)
Subjective:    Patient ID: Samuel [name], male    DOB: Nov 14, 1950, 69 y.o.   MRN: [id number]  HPI  Pt in for follow up.  Pt has hx of chronic back pain and some pain radiating to his legs. Pt has had surgery in the past. Pt states pain is all day but more noticeable at night and can't sleep.  Pt states he returned to his surgeon that did his back surgery. Per pt back looks ok presently. Surgeon told him possible nerve pain from diabetes but no finding on mri that accounts for his specific symptoms.   Pt last mri I see in epic 2019 reads.  IMPRESSION: 1. Status post L3-4 PLIF.  Stable grade 1 L3-4 anterolisthesis. 2. 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 3. 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 Multilevel neural foraminal narrowing: Moderate on the LEFT at L2-3.  Pt surgeon Dr. Danielle Dess.   In the past I had rx'd tramadol. He had concerns about potential side effects. Though no side effects reported. No longer on tramadol.  Review of Systems  Constitutional: Negative for chills, fatigue and fever.  Respiratory: Negative for cough, chest tightness, shortness of breath and wheezing.   Cardiovascular: Negative for chest pain.  Gastrointestinal: Negative for abdominal pain.  Genitourinary: Negative for dysuria.  Musculoskeletal: Positive for back pain.  Skin: Negative for rash.  Neurological:       Potential radicular pain vs neuropathy.  Hematological: Negative for adenopathy. Does not bruise/bleed easily.  Psychiatric/Behavioral: Negative for behavioral problems and confusion.    Past Medical History:  Diagnosis Date   Blood transfusion without reported diagnosis    Diabetes mellitus without complication (HCC)    type II   History of ETOH abuse    Hypertension      Social History   Socioeconomic History   Marital status: Married    Spouse name: Not on file   Number of children: Not on file   Years of education: Not on file   Highest  education level: Not on file  Occupational History   Not on file  Tobacco Use   Smoking status: Former Smoker   Smokeless tobacco: Former Neurosurgeon    Quit date: 11/08/1993  Vaping Use   Vaping Use: Never used  Substance and Sexual Activity   Alcohol use: No    Alcohol/week: 0.0 standard drinks    Comment: occasional   Drug use: No   Sexual activity: Not on file  Other Topics Concern   Not on file  Social History Narrative   Not on file   Social Determinants of Health   Financial Resource Strain:    Difficulty of Paying Living Expenses: Not on file  Food Insecurity:    Worried About Programme researcher, broadcasting/film/video in the Last Year: Not on file   [employer] of Food in the Last Year: Not on file  Transportation Needs:    Lack of Transportation (Medical): Not on file   Lack of Transportation (Non-Medical): Not on file  Physical Activity:    Days of Exercise per Week: Not on file   Minutes of Exercise per Session: Not on file  Stress:    Feeling of Stress : Not on file  Social Connections:    Frequency of Communication with Friends and Family: Not on file   Frequency of Social Gatherings with Friends and Family: Not on file   Attends Religious Services: Not on file   Active Member of Clubs or  Organizations: Not on file   Attends Banker Meetings: Not on file   Marital Status: Not on file  Intimate Partner Violence:    Fear of Current or Ex-Partner: Not on file   Emotionally Abused: Not on file   Physically Abused: Not on file   Sexually Abused: Not on file    Past Surgical History:  Procedure Laterality Date   BACK SURGERY N/A 2010    Family History  Problem Relation Age of Onset   Diabetes Unknown    Hypertension Unknown    Arthritis Father    Diabetes Father     No Known Allergies  Current Outpatient Medications on File Prior to Visit  Medication Sig Dispense Refill   aspirin EC 81 MG tablet Take 81 mg by mouth at bedtime.      atorvastatin (LIPITOR) 40 MG tablet TAKE 1 TABLET BY MOUTH EVERY DAY AND 1/2 EVERY NIGHT AT BEDTIME 45 tablet 5   B-D UF III MINI PEN NEEDLES 31G X 5 MM MISC USE FIVE PER DAY AS DIRECTED 200 each 0   diazepam (VALIUM) 5 MG tablet Take 1 tablet (5 mg total) by mouth every 6 (six) hours as needed for muscle spasms. 40 tablet 0   Empagliflozin-metFORMIN HCl ER (SYNJARDY XR) 03-999 MG TB24 Take 2 tablets by mouth daily. 60 tablet 2   glucose blood (FREESTYLE LITE) test strip USE AS INSTRUCTED FOUR TIMES DAILY 150 strip 2   Insulin Disposable Pump (OMNIPOD 5 PACK) MISC CHANGE EVERY 48 HOURS AS DIRECTED 45 each 2   insulin lispro (HUMALOG) 100 UNIT/ML injection Use up to 90 units daily via insulin pump. 90 mL 3   Insulin Pen Needle 32G X 4 MM MISC Use 5 pens per day to inject insulin 200 each 3   Insulin Syringe-Needle U-100 (INSULIN SYRINGE .5CC/30GX5/16") 30G X 5/16" 0.5 ML MISC Use 3 times a day for insulin 100 each 1   losartan (COZAAR) 50 MG tablet TAKE 1 TABLET(50 MG) BY MOUTH DAILY 90 tablet 1   meloxicam (MOBIC) 15 MG tablet Take 1 tablet (15 mg total) by mouth daily. 30 tablet 2   NON FORMULARY Take 2 capsules by mouth 2 (two) times daily. 4LIFE TRANSFER FACTORY CARDIO     omeprazole (PRILOSEC) 20 MG capsule Take 1 capsule (20 mg total) by mouth daily. 30 capsule 3   OVER THE COUNTER MEDICATION Take 2 tablets by mouth 2 (two) times daily. 4LIFE TRANSFER FACTORY RECALL (Patient not taking: Reported on 07/22/2020)     terbinafine (LAMISIL) 250 MG tablet Take 1 tablet (250 mg total) by mouth daily. 7 tablet 0   tiZANidine (ZANAFLEX) 4 MG tablet Take 1 tablet (4 mg total) by mouth every 8 (eight) hours as needed. 60 tablet 1   TRULICITY 1.5 MG/0.5ML SOPN INJECT 1.5 MG AS DIRECTED ONCE WEEKLY 2 mL 2   No current facility-administered medications on file prior to visit.    BP 132/65    Pulse 66    Temp 98.5 F (36.9 C) (Oral)    Resp 16    Wt 204 lb 6.4 oz (92.7 kg)    SpO2 97%     BMI 36.21 kg/m       Objective:   Physical Exam  General Appearance- Not in acute distress.    Chest and Lung Exam Auscultation: Breath sounds:-Normal. Clear even and unlabored. Adventitious sounds:- No Adventitious sounds.  Cardiovascular Auscultation:Rythm - Regular, rate and rythm. Heart Sounds -Normal heart sounds.  Abdomen Inspection:-Inspection Normal.  Palpation/Perucssion: Palpation and Percussion of the abdomen reveal- Non Tender, No Rebound tenderness, No rigidity(Guarding) and No Palpable abdominal masses.  Liver:-Normal.  Spleen:- Normal.   Back Rt si area tenderness to palpation and pain radiates to buttock rt side.  Lower ext neurologic  L5-S1 sensation intact bilaterally. Normal patellar reflexes bilaterally. No foot drop bilaterally.   Assessment & Plan:  You have back pain with radicular pain. Sounds like source could be from your back but recent mri came back negative for cause of pain per your report. I can't find neurosurgeon office note or recent mri done 2 weeks ago.  Neurosurgeon mentioned possible diabetic neuropathy pain. Will rx low dose gabapentin. Will titrate up to three times daily as we discussed.   cmp and lipid panel future placed for diabetes and high cholesterol  Follow up in 3 weeks or as needed.

## 2020-07-24 ENCOUNTER — Other Ambulatory Visit (INDEPENDENT_AMBULATORY_CARE_PROVIDER_SITE_OTHER): Payer: Medicare Other

## 2020-07-24 ENCOUNTER — Other Ambulatory Visit: Payer: Self-pay

## 2020-07-24 DIAGNOSIS — Z794 Long term (current) use of insulin: Secondary | ICD-10-CM

## 2020-07-24 DIAGNOSIS — E785 Hyperlipidemia, unspecified: Secondary | ICD-10-CM | POA: Diagnosis not present

## 2020-07-25 LAB — COMPREHENSIVE METABOLIC PANEL
AG Ratio: 2 (calc) (ref 1.0–2.5)
ALT: 20 U/L (ref 9–46)
AST: 18 U/L (ref 10–35)
Albumin: 4.4 g/dL (ref 3.6–5.1)
Alkaline phosphatase (APISO): 49 U/L (ref 35–144)
BUN: 18 mg/dL (ref 7–25)
CO2: 26 mmol/L (ref 20–32)
Calcium: 9.2 mg/dL (ref 8.6–10.3)
Chloride: 104 mmol/L (ref 98–110)
Creat: 0.76 mg/dL (ref 0.70–1.25)
Globulin: 2.2 g/dL (calc) (ref 1.9–3.7)
Glucose, Bld: 133 mg/dL — ABNORMAL HIGH (ref 65–99)
Potassium: 4.1 mmol/L (ref 3.5–5.3)
Sodium: 139 mmol/L (ref 135–146)
Total Bilirubin: 0.7 mg/dL (ref 0.2–1.2)
Total Protein: 6.6 g/dL (ref 6.1–8.1)

## 2020-07-25 LAB — LIPID PANEL
Cholesterol: 119 mg/dL (ref <200)
HDL: 44 mg/dL (ref >=40)
LDL Cholesterol (Calc): 52 mg/dL (calc)
Non-HDL Cholesterol (Calc): 75 mg/dL (calc) (ref <130)
Total CHOL/HDL Ratio: 2.7 (calc) (ref <5.0)
Triglycerides: 150 mg/dL — ABNORMAL HIGH (ref <150)

## 2020-07-29 ENCOUNTER — Other Ambulatory Visit: Payer: Self-pay

## 2020-07-29 ENCOUNTER — Ambulatory Visit (INDEPENDENT_AMBULATORY_CARE_PROVIDER_SITE_OTHER): Payer: Medicare Other | Admitting: Endocrinology

## 2020-07-29 VITALS — BP 132/80 | HR 62 | Temp 98.9°F | Ht 63.0 in | Wt 203.0 lb

## 2020-07-29 DIAGNOSIS — E1165 Type 2 diabetes mellitus with hyperglycemia: Secondary | ICD-10-CM | POA: Diagnosis not present

## 2020-07-29 DIAGNOSIS — I10 Essential (primary) hypertension: Secondary | ICD-10-CM

## 2020-07-29 DIAGNOSIS — Z794 Long term (current) use of insulin: Secondary | ICD-10-CM

## 2020-07-29 LAB — POCT GLYCOSYLATED HEMOGLOBIN (HGB A1C): Hemoglobin A1C: 7 % — AB (ref 4.0–5.6)

## 2020-07-29 NOTE — Patient Instructions (Signed)
Bolus for all meals even if sugar normal

## 2020-07-29 NOTE — Progress Notes (Signed)
Patient ID: Samuel [name], male   DOB: Jul 11, 1951, 69 y.o.   MRN: [id number]            Reason for Appointment:  Followup for Type 2 Diabetes  Referring physician: Saguier  History of Present Illness:          Diagnosis: Type 2 diabetes mellitus, date of diagnosis:  1990      Past history: He thinks he has been on insulin for the last 10-12 years. Previously had been on metformin which has been continued. He was probably tried on different insulin regimens initially but has been taking 70/30 mostly because of cost Previous records are not available and appears that his sugars have been poorly controlled for several years He was  referred here because of an A1c in 8/15 of 9.7%. He was on a regimen of Novolin 70/30 twice a day but because of inadequate control and higher readings later in the day he was switched to basal bolus insulin regimen in 12/15. Previous insulin regimen: Humalog  ac meals 35-40-30 Toujeo 35-40 units daily  Recent history:   INSULIN regimen is: OMNIPOD INSULIN PUMP  Basal rate settings: Midnight = 1.3, 6am: 1.65, 5 PM = 1.6.  Boluses 12 units breakfast -14 units lunch and 18 units at dinner at meals Total daily insulin up to 70 units  Carbohydrate coverage 1:10 and correction 1:50 Usual mealtime boluses: 12 units at breakfast, 14 at lunch and 18 at dinner   Non-insulin hypoglycemic drugs: Metformin 1 g twice a day, Trulicity 1.5 mg weekly, Synjardy XR 03/999, 2 tablets daily  A1c is now 7.2, slightly higher  Current basal insulin about 36 units and bolus recently 20 units/day  Current management, blood sugar patterns and problems identified:  He is still not bolusing for his meals consistently  Although he is not having any technical difficulties doing this he is afraid to bolus when his blood sugars are near normal which will cause some relatively high readings  He does periodically adjust his bolus based on how much he is eating and his blood sugar  level but doing this somewhat arbitrarily  As before if his blood sugars are high in late in the evening after dinner will take up to 13 units correction bolus, currently not getting any hypoglycemia overnight by doing this  He is checking his blood sugars at least 4 times a day regularly  Again is sometimes not bolusing at breakfast time even though he has had carbohydrates in the morning  Also yesterday evening he had rice and tortillas and did not bolus  Sometimes he is bolusing 16 units late at night when his blood sugar is over 200 and has had couple of episodes of low sugars during the night pain  FASTING blood sugars are overall fairly stable and mostly below 120 in the last few days  Has had only one slightly low sugar of 66 probably from inadequate carbohydrate intake at lunch  Has not been having any side effects or difficulty with compliance with the Trulicity and Synjardy  No consistent pattern of high blood sugars seen, most of his high readings are likely from missing his boluses before starting the meal  His weight is about the same, he says he is trying to walk regularly  His mealtimes are generally variable, evening meal 5-7 PM       Side effects from medications have been: ?  Nausea/dizziness on Victoza  Compliance with the medical regimen: Fair  Glucose monitoring:  done 2-3 times a day         Glucometer:  Freestyle/Omnipod      Blood Glucose readings by download    PRE-MEAL Fasting Lunch Dinner Bedtime Overall  Glucose range:  116-162  103-234    66-256  Mean/median:  127  148  145   143   POST-MEAL PC Breakfast PC Lunch PC Dinner  Glucose range:    97-206  Mean/median:    152   Previous readings:  PRE-MEAL Fasting Lunch Dinner Bedtime Overall  Glucose range:  84-145  86-188  175-233  108-223   Mean/median:  126   189  166  145   POST-MEAL PC Breakfast PC Lunch PC Dinner  Glucose range:     Mean/median:          Dietician visit, most recent:  5/16 Last CDE visit: 12/2016            Weight history: Previous range 200-220  Wt Readings from Last 3 Encounters:  07/29/20 203 lb (92.1 kg)  07/22/20 204 lb 6.4 oz (92.7 kg)  04/24/20 201 lb 6.4 oz (91.4 kg)    Glycemic control:   Lab Results  Component Value Date   HGBA1C 7.0 (A) 07/29/2020   HGBA1C 7.2 (A) 04/24/2020   HGBA1C 7.1 (A) 01/24/2020   Lab Results  Component Value Date   MICROALBUR <0.7 01/24/2020   LDLCALC 52 07/24/2020   CREATININE 0.76 07/24/2020    Lab Results  Component Value Date   FRUCTOSAMINE 302 (H) 07/20/2017       Allergies as of 07/29/2020   No Known Allergies     Medication List       Accurate as of July 29, 2020 11:59 PM. If you have any questions, ask your nurse or doctor.        aspirin EC 81 MG tablet Take 81 mg by mouth at bedtime.   atorvastatin 40 MG tablet Commonly known as: LIPITOR TAKE 1 TABLET BY MOUTH EVERY DAY AND 1/2 EVERY NIGHT AT BEDTIME   diazepam 5 MG tablet Commonly known as: Valium Take 1 tablet (5 mg total) by mouth every 6 (six) hours as needed for muscle spasms.   FREESTYLE LITE test strip Generic drug: glucose blood USE AS INSTRUCTED FOUR TIMES DAILY   gabapentin 300 MG capsule Commonly known as: NEURONTIN Take 300 mg by mouth 2 (two) times daily.   gabapentin 100 MG capsule Commonly known as: NEURONTIN Take 1 capsule (100 mg total) by mouth 3 (three) times daily.   insulin lispro 100 UNIT/ML injection Commonly known as: HumaLOG Use up to 90 units daily via insulin pump.   Insulin Pen Needle 32G X 4 MM Misc Use 5 pens per day to inject insulin   B-D UF III MINI PEN NEEDLES 31G X 5 MM Misc Generic drug: Insulin Pen Needle USE FIVE PER DAY AS DIRECTED   INSULIN SYRINGE .5CC/30GX5/16" 30G X 5/16" 0.5 ML Misc Use 3 times a day for insulin   losartan 50 MG tablet Commonly known as: COZAAR TAKE 1 TABLET(50 MG) BY MOUTH DAILY   meloxicam 15 MG tablet Commonly known as: MOBIC Take 1  tablet (15 mg total) by mouth daily.   NON FORMULARY Take 2 capsules by mouth 2 (two) times daily. 4LIFE TRANSFER FACTORY CARDIO   omeprazole 20 MG capsule Commonly known as: PRILOSEC Take 1 capsule (20 mg total) by mouth daily.   OmniPod 5 Pack Misc CHANGE EVERY 48 HOURS  AS DIRECTED   OVER THE COUNTER MEDICATION Take 2 tablets by mouth 2 (two) times daily. 4LIFE TRANSFER FACTORY RECALL   Synjardy XR 03-999 MG Tb24 Generic drug: Empagliflozin-metFORMIN HCl ER Take 2 tablets by mouth daily.   terbinafine 250 MG tablet Commonly known as: LAMISIL Take 1 tablet (250 mg total) by mouth daily.   tiZANidine 4 MG tablet Commonly known as: ZANAFLEX Take 1 tablet (4 mg total) by mouth every 8 (eight) hours as needed.   Trulicity 1.5 MG/0.5ML Sopn Generic drug: Dulaglutide INJECT 1.5 MG AS DIRECTED ONCE WEEKLY       Allergies: No Known Allergies  Past Medical History:  Diagnosis Date  . Blood transfusion without reported diagnosis   . Diabetes mellitus without complication (HCC)    type II  . History of ETOH abuse   . Hypertension     Past Surgical History:  Procedure Laterality Date  . BACK SURGERY N/A 2010    Family History  Problem Relation Age of Onset  . Diabetes Unknown   . Hypertension Unknown   . Arthritis Father   . Diabetes Father     Social History:  reports that he has quit smoking. He quit smokeless tobacco use about 26 years ago. He reports that he does not drink alcohol and does not use drugs.    Review of Systems         Lipids:  He has been on Lipitor 40 mg for hypercholesterolemia since about 2011, lipid levels as below, has had good control         Lab Results  Component Value Date   CHOL 119 07/24/2020   HDL 44 07/24/2020   LDLCALC 52 07/24/2020   LDLDIRECT 44.0 01/24/2020   TRIG 150 (H) 07/24/2020   CHOLHDL 2.7 07/24/2020    Hypertension: Has been treated with losartan 50 mg, prescribed by PCP Also on Jardiance Blood pressure  appears consistently controlled  BP Readings from Last 3 Encounters:  07/29/20 132/80  07/22/20 132/65  04/24/20 110/60    Microalbumin normal as of 3/21   Physical Examination:  BP 132/80 (BP Location: Left Arm, Patient Position: Sitting, Cuff Size: Normal)   Pulse 62   Temp 98.9 F (37.2 C) (Oral)   Ht 5\' 3"  (1.6 m)   Wt 203 lb (92.1 kg)   SpO2 94%   BMI 35.96 kg/m     ASSESSMENT:  Diabetes type 2, with obesity and BMI of around 36  See history of present illness for  description of current diabetes management, blood sugar patterns and problems identified..  His diabetes is being managed with the Omnipod insulin pump, Synjardy and Trulicity 1.5 mg  His A1c is good at 7%   Even with his not bolusing consistently before every meal his overall level of control is fairly good Blood sugar variability is only depending on his compliance with mealtime boluses Today explained to him in detail the reason for doing boluses to cover his meals and carbohydrates regardless of Premeal blood sugar even if it is normal He can still adjusted mealtime boluses based on portion size and Premeal blood sugar also  He can also reduce his boluses if he is eating less carbohydrate or planning to be more active   HYPERTENSION: His blood pressure is well controlled on losartan half tablet daily  LIPIDS: Excellent control with 40 mg Lipitor   PLAN:   His mealtime boluses overall will be the same but he will need to adjust them as  discussed above We will try to be better compliant He will also try to get the freestyle Josephine Igo which will also make it more convenient and allow him to observe his blood sugars more closely Showed him how the freestyle libre works, information derived and also how he will check his sugars Gave him list of DME suppliers for this In the meantime we will continue the same basal settings To stay on Trulicity and Synjardy unchanged  Check blood pressure  periodically at home and keep a record for review   Patient Instructions  Bolus for all meals even if sugar normal    Reather Littler 07/30/2020, 8:40 AM   Note: This office note was prepared with Dragon voice recognition system technology. Any transcriptional errors that result from this process are unintentional.

## 2020-08-04 ENCOUNTER — Telehealth: Payer: Self-pay | Admitting: Endocrinology

## 2020-08-04 NOTE — Telephone Encounter (Signed)
I believe I gave him a list of DME suppliers for this, needs to call one of them to start the ordering process.  If he does not have the list I have the information to give him

## 2020-08-04 NOTE — Telephone Encounter (Signed)
Patient called to advise that he has reviewed information provided to him by Dr Lucianne Muss and he would like to pursue getting he [employer].  Any questions or concerns - [phone]

## 2020-08-05 NOTE — Telephone Encounter (Signed)
Advised patient to contact DME list for assistance with [facility].

## 2020-08-06 NOTE — Telephone Encounter (Signed)
Patient called back ,we got an interpreter on the line and advised him to call the DME list provided by Dr Lucianne Muss. Patient via interpreter acknowledged and verbalized understanding.

## 2020-08-12 ENCOUNTER — Telehealth: Payer: Self-pay | Admitting: *Deleted

## 2020-08-12 NOTE — Telephone Encounter (Signed)
Faxed/sent complete documentation to [facility] [phone]

## 2020-08-18 ENCOUNTER — Other Ambulatory Visit: Payer: Self-pay | Admitting: Endocrinology

## 2020-08-21 ENCOUNTER — Other Ambulatory Visit: Payer: Self-pay | Admitting: Endocrinology

## 2020-08-29 NOTE — Telephone Encounter (Signed)
Records were faxed to [facility] @ [facility]

## 2020-08-29 NOTE — Telephone Encounter (Signed)
Samuel [name] with [facility] ph# [phone] called re: [facility] received the RX but did not receive the last visit office notes. Samuel [name] requests the most recent office notes be faxed to [facility] at fax# [phone].

## 2020-09-05 ENCOUNTER — Other Ambulatory Visit: Payer: Self-pay | Admitting: Endocrinology

## 2020-10-07 ENCOUNTER — Other Ambulatory Visit: Payer: Self-pay

## 2020-10-07 ENCOUNTER — Telehealth (INDEPENDENT_AMBULATORY_CARE_PROVIDER_SITE_OTHER): Payer: Medicare Other | Admitting: Internal Medicine

## 2020-10-07 VITALS — BP 155/79 | HR 68 | Ht 63.0 in | Wt 205.0 lb

## 2020-10-07 DIAGNOSIS — J069 Acute upper respiratory infection, unspecified: Secondary | ICD-10-CM | POA: Diagnosis not present

## 2020-10-07 NOTE — Progress Notes (Signed)
Subjective:    Patient ID: Samuel [name], male    DOB: 01/05/1951, 69 y.o.   MRN: [id number]  DOS:  10/07/2020 Type of visit - description: Virtual Visit via Video Note  I connected with the above patient  by a video enabled telemedicine application and verified that I am speaking with the correct person using two identifiers.   THIS ENCOUNTER IS A VIRTUAL VISIT DUE TO COVID-19 - PATIENT WAS NOT SEEN IN THE OFFICE. PATIENT HAS CONSENTED TO VIRTUAL VISIT / TELEMEDICINE VISIT   Location of patient: home  Location of provider: office  Persons participating in the virtual visit: patient, provider   I discussed the limitations of evaluation and management by telemedicine and the availability of in person appointments. The patient expressed understanding and agreed to proceed.  Acute Symptoms started 4 to 5 days ago: Cough with no sputum production, some nasal congestion. The patient is taking Robitussin with some relief of symptoms.   Review of Systems Denies fever chills Admits to nasal congestion with clear discharge. No headache. No nausea or vomiting. No myalgias. No chest pain no difficulty breathing  Past Medical History:  Diagnosis Date  . Blood transfusion without reported diagnosis   . Diabetes mellitus without complication (HCC)    type II  . History of ETOH abuse   . Hypertension     Past Surgical History:  Procedure Laterality Date  . BACK SURGERY N/A 2010    Allergies as of 10/07/2020   No Known Allergies     Medication List       Accurate as of October 07, 2020 11:59 PM. If you have any questions, ask your nurse or doctor.        aspirin EC 81 MG tablet Take 81 mg by mouth at bedtime.   atorvastatin 40 MG tablet Commonly known as: LIPITOR TAKE 1 TABLET BY MOUTH EVERY DAY AND 1/2 EVERY NIGHT AT BEDTIME   diazepam 5 MG tablet Commonly known as: Valium Take 1 tablet (5 mg total) by mouth every 6 (six) hours as needed for muscle spasms.     FREESTYLE LITE test strip Generic drug: glucose blood USE AS INSTRUCTED FOUR TIMES DAILY   gabapentin 300 MG capsule Commonly known as: NEURONTIN Take 300 mg by mouth 2 (two) times daily.   gabapentin 100 MG capsule Commonly known as: NEURONTIN Take 1 capsule (100 mg total) by mouth 3 (three) times daily.   insulin lispro 100 UNIT/ML injection Commonly known as: HUMALOG INJECT UP TO 90 UNITS UNDER THE SKIN DAILY VIA INSULIN PUMP   Insulin Pen Needle 32G X 4 MM Misc Use 5 pens per day to inject insulin   B-D UF III MINI PEN NEEDLES 31G X 5 MM Misc Generic drug: Insulin Pen Needle USE FIVE PER DAY AS DIRECTED   INSULIN SYRINGE .5CC/30GX5/16" 30G X 5/16" 0.5 ML Misc Use 3 times a day for insulin   losartan 50 MG tablet Commonly known as: COZAAR TAKE 1 TABLET(50 MG) BY MOUTH DAILY   meloxicam 15 MG tablet Commonly known as: MOBIC Take 1 tablet (15 mg total) by mouth daily.   NON FORMULARY Take 2 capsules by mouth 2 (two) times daily. 4LIFE TRANSFER FACTORY CARDIO   omeprazole 20 MG capsule Commonly known as: PRILOSEC Take 1 capsule (20 mg total) by mouth daily.   OmniPod 5 Pack Misc CHANGE EVERY 48 HOURS AS DIRECTED   OVER THE COUNTER MEDICATION Take 2 tablets by mouth 2 (two) times daily.  4LIFE TRANSFER FACTORY RECALL   Synjardy XR 03-999 MG Tb24 Generic drug: Empagliflozin-metFORMIN HCl ER Take 2 tablets by mouth daily.   Trulicity 1.5 MG/0.5ML Sopn Generic drug: Dulaglutide INJECT 1.5 MG AS DIRECTED ONCE WEEKLY          Objective:   Physical Exam BP (!) 155/79 (BP Location: Left Arm, Patient Position: Sitting, Cuff Size: Large)   Pulse 68   Ht 5\' 3"  (1.6 m)   Wt 205 lb (93 kg)   SpO2 94%   BMI 36.31 kg/m  This is a virtual video visit, he is alert oriented x3 in no distress, speaking in complete sentences, nontoxic appearing.     Assessment    69 year old male, history of HTN, diabetes, high cholesterol, asthma (per chart review, on no  medicines) presents with:  URI: Symptoms started 4 to 5 days ago, cough, sinus congestion and no other symptoms. S/p COVID vaccines x3. DDx includes URI, bronchitis, Covid, other viruses. My suspicions for Covid is low but he is high risk. Plan: Get a PCR Covid testing at the pharmacy. Rest, fluids, Robitussin, start Flonase. Definitely call if not improving if he gets worse. The patient verbalized understanding and took notes during the visit.   I discussed the assessment and treatment plan with the patient. The patient was provided an opportunity to ask questions and all were answered. The patient agreed with the plan and demonstrated an understanding of the instructions.   The patient was advised to call back or seek an in-person evaluation if the symptoms worsen or if the condition fails to improve as anticipated.

## 2020-11-03 ENCOUNTER — Other Ambulatory Visit: Payer: Self-pay | Admitting: Endocrinology

## 2020-11-10 ENCOUNTER — Other Ambulatory Visit: Payer: Self-pay

## 2020-11-10 ENCOUNTER — Ambulatory Visit (INDEPENDENT_AMBULATORY_CARE_PROVIDER_SITE_OTHER): Payer: Medicare Other | Admitting: Medical

## 2020-11-10 ENCOUNTER — Encounter: Payer: Self-pay | Admitting: Medical

## 2020-11-10 VITALS — BP 130/70 | HR 67 | Resp 18 | Ht 63.0 in | Wt 205.0 lb

## 2020-11-10 DIAGNOSIS — M25562 Pain in left knee: Secondary | ICD-10-CM

## 2020-11-10 DIAGNOSIS — E119 Type 2 diabetes mellitus without complications: Secondary | ICD-10-CM

## 2020-11-10 DIAGNOSIS — N481 Balanitis: Secondary | ICD-10-CM

## 2020-11-10 DIAGNOSIS — E785 Hyperlipidemia, unspecified: Secondary | ICD-10-CM

## 2020-11-10 DIAGNOSIS — M25561 Pain in right knee: Secondary | ICD-10-CM

## 2020-11-10 DIAGNOSIS — G8929 Other chronic pain: Secondary | ICD-10-CM

## 2020-11-10 DIAGNOSIS — I10 Essential (primary) hypertension: Secondary | ICD-10-CM

## 2020-11-10 DIAGNOSIS — Z794 Long term (current) use of insulin: Secondary | ICD-10-CM

## 2020-11-10 MED ORDER — CELECOXIB 100 MG PO CAPS: 100.0000 mg | ORAL_CAPSULE | Freq: Two times a day (BID) | ORAL | 0 refills | Status: DC

## 2020-11-10 MED ORDER — NYSTATIN 100000 UNIT/GM EX CREA: 1.0000 "application " | TOPICAL_CREAM | Freq: Two times a day (BID) | CUTANEOUS | 0 refills | Status: DC

## 2020-11-10 MED ORDER — FLUCONAZOLE 150 MG PO TABS: ORAL_TABLET | ORAL | 0 refills | Status: DC

## 2020-11-10 NOTE — Patient Instructions (Signed)
Your blood pressure is well controlled today on recheck.  Continue losartan 50 mg daily.  History of hyperlipidemia and well controlled on atorvastatin 40 mg daily.  Diabetes also in control range of 7.0 A1c.  Continue follow-up with Dr. Lucianne Muss  Place in future CMP and lipid panel to be done fasting within the next month.  Please get scheduled on the way out.  Recent balanitis.  Contributing factors are diabetes and uncircumcised.  Will prescribe Diflucan to use 1 tablet a day for 3 days.  Then can use nystatin occasionally.  If you get chronic balanitis then might need to refer you to urologist as sometimes diabetics can get chronic fungal infections.  Bilateral knee pain.  Worse on the left side.  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  Prescribe the Celebrex NSAID and went ahead and place new referral to [facility] affiliated orthopedist.  Follow-up in 2 weeks or as needed.

## 2020-11-10 NOTE — Progress Notes (Signed)
Subjective:    Patient ID: Samuel [name], male    DOB: 1951/03/30, 70 y.o.   MRN: [id number]  HPI  Pt in today with bilateral knee pain. Worse left side. Pt has pain for months. Pt states he has used knee brace. Pain is keeping him up at night.   In the summer pt had xray of left knee. COMPARISON:  05/14/2016  FINDINGS: No evidence of fracture, dislocation, or joint effusion. Minimal tricompartmental osteophytosis without significant joint space narrowing. Soft tissues are unremarkable.  IMPRESSION: No fracture or dislocation of the left knee. Minimal tricompartmental osteophytosis without significant joint space narrowing. No knee joint effusion.   Pt had gone to orthopedist. Below was A/P Assessment:  1. Chronic pain of right knee XR KNEE RT 3 VIEWS AP, Lateral, Merchant  2. Primary osteoarthritis of right knee    Plan: Will order viscosupplementation. Follow-up once we have approval to receive the injection.       Pt states has used gabapentin. He has used in past. But no recent nsaids.  Pt has hx of back pain as well. He has followed up with his neurosurgeon and had repeat mri. Pt state his neurologist does not think lower ext pain coming from back.   Pt has htn. Pt bp initially high but better on recheck.Pt has not checked bp at home. Pt is on losartan 50 mg daily. He takes it at night. Last night stated did not take meds.   Pt has high cholesterol. Last lipid panel 3 or more months ago looked good. On atorvastatin 40 mg.   Pt last a1c was 7 in September.  Pt has had 3  covid vaccines.   Pt states he has some pain at head of penis. Pain after sex. Pt is not circumcised. Pt glands has been red and foreskin inflamed for about a month on and off. Worse pain after sex for about an hour.     Review of Systems  Constitutional: Negative for chills, fatigue and fever.  HENT: Negative for congestion, drooling, postnasal drip and sinus pain.   Respiratory:  Negative for cough, chest tightness, shortness of breath and wheezing.   Cardiovascular: Negative for chest pain and palpitations.  Gastrointestinal: Negative for abdominal pain.  Genitourinary: Negative for dysuria, frequency, hematuria and penile pain.  Musculoskeletal:       Knee pain. Both side. Left knee worse.  Skin: Negative for rash.  Neurological: Negative for dizziness, seizures, syncope, weakness and light-headedness.  Hematological: Negative for adenopathy. Does not bruise/bleed easily.  Psychiatric/Behavioral: Negative for behavioral problems, confusion and decreased concentration.    Past Medical History:  Diagnosis Date  . Blood transfusion without reported diagnosis   . Diabetes mellitus without complication (HCC)    type II  . History of ETOH abuse   . Hypertension      Social History   Socioeconomic History  . Marital status: Married    Spouse name: Not on file  . Number of children: Not on file  . Years of education: Not on file  . Highest education level: Not on file  Occupational History  . Not on file  Tobacco Use  . Smoking status: Former Games developer  . Smokeless tobacco: Former Neurosurgeon    Quit date: 11/08/1993  Vaping Use  . Vaping Use: Never used  Substance and Sexual Activity  . Alcohol use: No    Alcohol/week: 0.0 standard drinks    Comment: occasional  . Drug use: No  . Sexual  activity: Not on file  Other Topics Concern  . Not on file  Social History Narrative  . Not on file   Social Determinants of Health   Financial Resource Strain: Not on file  Food Insecurity: Not on file  Transportation Needs: Not on file  Physical Activity: Not on file  Stress: Not on file  Social Connections: Not on file  Intimate Partner Violence: Not on file    Past Surgical History:  Procedure Laterality Date  . BACK SURGERY N/A 2010    Family History  Problem Relation Age of Onset  . Diabetes Unknown   . Hypertension Unknown   . Arthritis Father   .  Diabetes Father     No Known Allergies  Current Outpatient Medications on File Prior to Visit  Medication Sig Dispense Refill  . aspirin EC 81 MG tablet Take 81 mg by mouth at bedtime.    [name] atorvastatin (LIPITOR) 40 MG tablet TAKE 1 TABLET BY MOUTH EVERY DAY AND 1/2 EVERY NIGHT AT BEDTIME 45 tablet 5  . B-D UF III MINI PEN NEEDLES 31G X 5 MM MISC USE FIVE PER DAY AS DIRECTED 200 each 0  . diazepam (VALIUM) 5 MG tablet Take 1 tablet (5 mg total) by mouth every 6 (six) hours as needed for muscle spasms. 40 tablet 0  . Empagliflozin-metFORMIN HCl ER (SYNJARDY XR) 03-999 MG TB24 Take 2 tablets by mouth daily. 60 tablet 2  . gabapentin (NEURONTIN) 100 MG capsule Take 1 capsule (100 mg total) by mouth 3 (three) times daily. 90 capsule 3  . gabapentin (NEURONTIN) 300 MG capsule Take 300 mg by mouth 2 (two) times daily.    [name] glucose blood (FREESTYLE LITE) test strip USE AS INSTRUCTED FOUR TIMES DAILY 150 strip 2  . Insulin Disposable Pump (OMNIPOD 5 PACK) MISC CHANGE EVERY 48 HOURS AS DIRECTED 45 each 2  . insulin lispro (HUMALOG) 100 UNIT/ML injection INJECT UP TO 90 UNITS UNDER THE SKIN DAILY VIA INSULIN PUMP 90 mL 3  . Insulin Pen Needle 32G X 4 MM MISC Use 5 pens per day to inject insulin 200 each 3  . Insulin Syringe-Needle U-100 (INSULIN SYRINGE .5CC/30GX5/16") 30G X 5/16" 0.5 ML MISC Use 3 times a day for insulin 100 each 1  . losartan (COZAAR) 50 MG tablet TAKE 1 TABLET(50 MG) BY MOUTH DAILY 90 tablet 1  . meloxicam (MOBIC) 15 MG tablet Take 1 tablet (15 mg total) by mouth daily. 30 tablet 2  . NON FORMULARY Take 2 capsules by mouth 2 (two) times daily. 4LIFE TRANSFER FACTORY CARDIO    . omeprazole (PRILOSEC) 20 MG capsule Take 1 capsule (20 mg total) by mouth daily. 30 capsule 3  . OVER THE COUNTER MEDICATION Take 2 tablets by mouth 2 (two) times daily. 4LIFE TRANSFER FACTORY RECALL    . TRULICITY 1.5 VH/8.4ON SOPN INJECT 1.5 MG AS DIRECTED ONCE WEEKLY 2 mL 2   No current  facility-administered medications on file prior to visit.    BP 130/70   Pulse 67   Resp 18   Ht 5\' 3"  (1.6 m)   Wt 205 lb (93 kg)   SpO2 96%   BMI 36.31 kg/m       Objective:   Physical Exam   General Mental Status- Alert. General Appearance- Not in acute distress.   Skin General: Color- Normal Color. Moisture- Normal Moisture.  Neck Carotid Arteries- Normal color. Moisture- Normal Moisture. No carotid bruits. No JVD.  Chest and Lung  Exam Auscultation: Breath Sounds:-Normal.  Cardiovascular Auscultation:Rythm- Regular. Murmurs & Other Heart Sounds:Auscultation of the heart reveals- No Murmurs.  Abdomen Inspection:-Inspeection Normal. Palpation/Percussion:Note:No mass. Palpation and Percussion of the abdomen reveal- Non Tender, Non Distended + BS, no rebound or guarding.   Neurologic Cranial Nerve exam:- CN III-XII intact(No nystagmus), symmetric smile. Strength:- 5/5 equal and symmetric strength both upper and lower extremities.  Right knee-no swelling or crepitus on range of motion.  No instability. Left knee-medial aspect over tibial plateau mild crepitus on flexion extension and tenderness to palpation.  No instability.  Genital-uncircumcised with mild faint pinkish redness to glans.  No obvious inflammation.       Assessment & Plan:  Your blood pressure is well controlled today on recheck.  Continue losartan 50 mg daily.  History of hyperlipidemia and well controlled on atorvastatin 40 mg daily.  Diabetes also in control range of 7.0 A1c.  Continue follow-up with Dr. Lucianne Muss  Place in future CMP and lipid panel to be done fasting within the next month.  Please get scheduled on the way out.  Recent balanitis.  Contributing factors are diabetes and uncircumcised.  Will prescribe Diflucan to use 1 tablet a day for 3 days.  Then can use nystatin occasionally.  If you get chronic balanitis then might need to refer you to urologist as sometimes diabetics can  get chronic fungal infections.  Bilateral knee pain.  Worse on the left side.  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  Prescribe the Celebrex NSAID and went ahead and place new referral to [facility] affiliated orthopedist.  Follow-up in 2 weeks or as needed.  Time spent with patient today was 40  minutes which consisted of chart review, discussing diagnoses, work up, treatment, placing referral, answering question and documentation.

## 2020-12-01 ENCOUNTER — Other Ambulatory Visit: Payer: Self-pay

## 2020-12-01 ENCOUNTER — Ambulatory Visit: Payer: Medicare Other | Admitting: Endocrinology

## 2020-12-01 ENCOUNTER — Ambulatory Visit: Payer: Self-pay

## 2020-12-01 ENCOUNTER — Ambulatory Visit (INDEPENDENT_AMBULATORY_CARE_PROVIDER_SITE_OTHER): Payer: Medicare Other | Admitting: Orthopaedic Surgery

## 2020-12-01 VITALS — Ht 63.0 in | Wt 205.0 lb

## 2020-12-01 DIAGNOSIS — M1712 Unilateral primary osteoarthritis, left knee: Secondary | ICD-10-CM

## 2020-12-01 DIAGNOSIS — M25561 Pain in right knee: Secondary | ICD-10-CM

## 2020-12-01 DIAGNOSIS — G8929 Other chronic pain: Secondary | ICD-10-CM

## 2020-12-01 DIAGNOSIS — M1711 Unilateral primary osteoarthritis, right knee: Secondary | ICD-10-CM | POA: Diagnosis not present

## 2020-12-01 MED ORDER — LIDOCAINE HCL 1 % IJ SOLN
3.0000 mL | INTRAMUSCULAR | Status: AC | PRN
  Administered 2020-12-01: 3 mL

## 2020-12-01 MED ORDER — METHYLPREDNISOLONE ACETATE 40 MG/ML IJ SUSP
40.0000 mg | INTRAMUSCULAR | Status: AC | PRN
  Administered 2020-12-01: 40 mg via INTRA_ARTICULAR

## 2020-12-01 MED ORDER — MELOXICAM 15 MG PO TABS: 15.0000 mg | ORAL_TABLET | Freq: Every day | ORAL | 1 refills | Status: DC

## 2020-12-01 NOTE — Progress Notes (Signed)
Office Visit Note   Patient: Samuel [name]           Date of Birth: 22-Sep-1951           MRN: [id number] Visit Date: 12/01/2020              Requested by: Esperanza Richters, PA-C [id number] [place],  [place] [address] PCP: Esperanza Richters, PA-C   Assessment & Plan: Visit Diagnoses:  1. Chronic pain of right knee   2. Unilateral primary osteoarthritis, left knee   3. Unilateral primary osteoarthritis, right knee     Plan: I went over his x-rays in detail and through the interpreter describe the arthritis he is dealing with in both knees.  He is a candidate for trying a steroid injection in at least his left knee today.  Given his status is a diabetic I only injected 1 knee.  He eventually is going to be a good candidate for hyaluronic acid.  I would like to send him to outpatient physical therapy to work on modalities that can strengthen both knees and help him get around better.  He agrees with this treatment plan.  He did tolerate the left knee steroid injection well today.  We can see him back in 6 weeks to see how he is doing overall.  We will try some meloxicam as an anti-inflammatory as well.  All questions and concerns were answered and addressed.  Follow-Up Instructions: Return in about 6 weeks (around 01/12/2021).   Orders:  Orders Placed This Encounter  Procedures  . Large Joint Inj  . XR Knee 1-2 Views Right   Meds ordered this encounter  Medications  . meloxicam (MOBIC) 15 MG tablet    Sig: Take 1 tablet (15 mg total) by mouth daily.    Dispense:  30 tablet    Refill:  1      Procedures: Large Joint Inj: L knee on 12/01/2020 2:52 PM Indications: diagnostic evaluation and pain Details: 22 G 1.5 in needle, superolateral approach  Arthrogram: No  Medications: 3 mL lidocaine 1 %; 40 mg methylPREDNISolone acetate 40 MG/ML Outcome: tolerated well, no immediate complications Procedure, treatment alternatives, risks and benefits explained, specific risks  discussed. Consent was given by the patient. Immediately prior to procedure a time out was called to verify the correct patient, procedure, equipment, support staff and site/side marked as required. Patient was prepped and draped in the usual sterile fashion.       Clinical Data: No additional findings.   Subjective: Chief Complaint  Patient presents with  . Right Knee - Pain  . Left Knee - Pain  The patient has some arm sling for the first time.  He is a very pleasant 70 year old gentleman who comes with an interpreter to discuss bilateral knee pain that is been hurting for years with the left worse than the right.  He is a diabetic.  I saw his last hemoglobin A1c was 7 which was several months ago.  He reports mainly arthritis type of pain.  His knees get stiff if he is been sitting for a while gets up to walk his knees hurt.  It is hard for him to get down on his knees and get back up.  Both knees have some grinding and pain on the medial aspect of both knees.  He has never had surgery on his knees.  He is never had injections in his knees.  HPI  Review of Systems He  currently denies any headache, chest pain, shortness of breath, fever, chills, nausea, vomiting  Objective: Vital Signs: Ht 5\' 3"  (1.6 m)   Wt 205 lb (93 kg)   BMI 36.31 kg/m   Physical Exam He is alert and orient x3 and in no acute distress Ortho Exam Examination of both knees show significant patellofemoral crepitation.  Both knees have varus malalignment that is correctable.  Both knees have medial joint line tenderness with good range of motion.  Both knees are ligamentously stable. Specialty Comments:  No specialty comments available.  Imaging: XR Knee 1-2 Views Right  Result Date: 12/01/2020 2 views of the right knee show tricompartmental arthritis with mainly medial joint space narrowing, patellofemoral narrowing and varus malalignment.  Previous x-rays of the left knee from last year and been reviewed  also shows tricompartmental arthritic changes which is moderate.  PMFS History: Patient Active Problem List   Diagnosis Date Noted  . Lumbar stenosis with neurogenic claudication 07/24/2018  . Colon cancer screening 08/03/2016  . Low back pain 05/24/2016  . Bilateral knee pain 05/24/2016  . Pain of right heel 05/01/2015  . Neuropathy due to secondary diabetes mellitus (HCC) 05/01/2015  . Cough 01/02/2015  . Diabetes (HCC) 10/02/2014  . Essential hypertension, benign 06/17/2014  . Type II or unspecified type diabetes mellitus without mention of complication, not stated as uncontrolled 06/17/2014  . Hyperlipidemia 06/17/2014  . Hip pain 06/17/2014  . Back pain 06/17/2014  . Frequent urination 06/17/2014  . Unspecified asthma(493.90) 06/17/2014  . Asthma 06/17/2014  . Diabetes mellitus type 2, uncontrolled (HCC) 01/26/2012   Past Medical History:  Diagnosis Date  . Blood transfusion without reported diagnosis   . Diabetes mellitus without complication (HCC)    type II  . History of ETOH abuse   . Hypertension     Family History  Problem Relation Age of Onset  . Diabetes Unknown   . Hypertension Unknown   . Arthritis Father   . Diabetes Father     Past Surgical History:  Procedure Laterality Date  . BACK SURGERY N/A 2010   Social History   Occupational History  . Not on file  Tobacco Use  . Smoking status: Former 2011  . Smokeless tobacco: Former Games developer    Quit date: 11/08/1993  Vaping Use  . Vaping Use: Never used  Substance and Sexual Activity  . Alcohol use: No    Alcohol/week: 0.0 standard drinks    Comment: occasional  . Drug use: No  . Sexual activity: Not on file

## 2020-12-16 ENCOUNTER — Encounter: Payer: Self-pay | Admitting: Endocrinology

## 2020-12-16 ENCOUNTER — Ambulatory Visit: Payer: Medicare Other | Admitting: Physical Therapy

## 2020-12-16 ENCOUNTER — Ambulatory Visit: Payer: Medicare Other | Admitting: Endocrinology

## 2020-12-16 ENCOUNTER — Other Ambulatory Visit (INDEPENDENT_AMBULATORY_CARE_PROVIDER_SITE_OTHER): Payer: Medicare Other

## 2020-12-16 ENCOUNTER — Other Ambulatory Visit: Payer: Self-pay

## 2020-12-16 VITALS — BP 138/78 | HR 69 | Ht 63.0 in | Wt 208.2 lb

## 2020-12-16 DIAGNOSIS — E1165 Type 2 diabetes mellitus with hyperglycemia: Secondary | ICD-10-CM | POA: Diagnosis not present

## 2020-12-16 DIAGNOSIS — I10 Essential (primary) hypertension: Secondary | ICD-10-CM | POA: Diagnosis not present

## 2020-12-16 DIAGNOSIS — E119 Type 2 diabetes mellitus without complications: Secondary | ICD-10-CM

## 2020-12-16 DIAGNOSIS — E785 Hyperlipidemia, unspecified: Secondary | ICD-10-CM

## 2020-12-16 DIAGNOSIS — Z794 Long term (current) use of insulin: Secondary | ICD-10-CM

## 2020-12-16 LAB — LIPID PANEL
Cholesterol: 96 mg/dL (ref 0–200)
HDL: 42.4 mg/dL (ref >39.00)
LDL Cholesterol: 37 mg/dL (ref 0–99)
NonHDL: 53.69
Total CHOL/HDL Ratio: 2
Triglycerides: 82 mg/dL (ref 0.0–149.0)
VLDL: 16.4 mg/dL (ref 0.0–40.0)

## 2020-12-16 LAB — COMPREHENSIVE METABOLIC PANEL
ALT: 23 U/L (ref 0–53)
AST: 20 U/L (ref 0–37)
Albumin: 4.5 g/dL (ref 3.5–5.2)
Alkaline Phosphatase: 40 U/L (ref 39–117)
BUN: 19 mg/dL (ref 6–23)
CO2: 33 mEq/L — ABNORMAL HIGH (ref 19–32)
Calcium: 9.6 mg/dL (ref 8.4–10.5)
Chloride: 102 mEq/L (ref 96–112)
Creatinine, Ser: 0.83 mg/dL (ref 0.40–1.50)
GFR: 89.33 mL/min (ref >60.00)
Glucose, Bld: 108 mg/dL — ABNORMAL HIGH (ref 70–99)
Potassium: 4.2 mEq/L (ref 3.5–5.1)
Sodium: 140 mEq/L (ref 135–145)
Total Bilirubin: 0.8 mg/dL (ref 0.2–1.2)
Total Protein: 6.8 g/dL (ref 6.0–8.3)

## 2020-12-16 LAB — POCT GLYCOSYLATED HEMOGLOBIN (HGB A1C): Hemoglobin A1C: 6.8 % — AB (ref 4.0–5.6)

## 2020-12-16 LAB — MICROALBUMIN / CREATININE URINE RATIO
Creatinine,U: 91.9 mg/dL
Microalb Creat Ratio: 0.8 mg/g (ref 0.0–30.0)
Microalb, Ur: <0.7 mg/dL (ref 0.0–1.9)

## 2020-12-16 MED ORDER — FREESTYLE LITE TEST VI STRP: ORAL_STRIP | 2 refills | Status: DC

## 2020-12-16 NOTE — Progress Notes (Signed)
Patient ID: Samuel [name], male   DOB: [birth year], 70 y.o.   MRN: [id number]            Reason for Appointment:  Followup for Type 2 Diabetes  Referring physician: Saguier  History of Present Illness:          Diagnosis: Type 2 diabetes mellitus, date of diagnosis:  1990      Past history: He thinks he has been on insulin for the last 10-12 years. Previously had been on metformin which has been continued. He was probably tried on different insulin regimens initially but has been taking 70/30 mostly because of cost Previous records are not available and appears that his sugars have been poorly controlled for several years He was  referred here because of an A1c in 8/15 of 9.7%. He was on a regimen of Novolin 70/30 twice a day but because of inadequate control and higher readings later in the day he was switched to basal bolus insulin regimen in 12/15. Previous insulin regimen: Humalog  ac meals 35-40-30 Toujeo 35-40 units daily  Recent history:   INSULIN regimen is: OMNIPOD INSULIN PUMP  Basal rate settings: Midnight = 1.3, 6am: 1.65, 5 PM = 1.6.  Boluses 12 units breakfast -14 units lunch and 18 units at dinner at meals Total daily insulin up to 70 units  Carbohydrate coverage 1:10 and correction 1:50 Usual mealtime boluses: 12 units at breakfast, 14 at lunch and 18 at dinner   Non-insulin hypoglycemic drugs: Metformin 1 g twice a day, Trulicity 1.5 mg weekly, Synjardy XR 03/999, 2 tablets daily  A1c is now 6.8 and gradually improving  Current basal insulin about 36 units and bolus recently 20 units/day  Current management, blood sugar patterns and problems identified:  He has finally started using the freestyle libre  He is still not bolusing for his meals consistently and will not bolus when the blood sugars are near normal despite previous advice on bolusing consistently  With this he has occasional boluses late at night when the blood sugars are higher blood sugar may  be low normal following the boluses  Although he is not having any technical difficulties with his pump he has frequent alarms noted, he thinks he is changing his pods when the insulin is running out  His blood sugars have been entered in the pump and he did not know how to do so  He thinks his freestyle Josephine Igo is not working consistently for 14 days and has data only for the last 4 days which is complete  Currently blood sugars are generally well controlled but seems to have some postprandial high readings at times, especially after lunch or high normal readings at bedtime  Does not feel he has had any hypoglycemic symptoms  Starting to get a little weight now  His mealtimes are generally variable, evening meal 5-7 PM       Side effects from medications have been: ?  Nausea/dizziness on Victoza  Compliance with the medical regimen: Fair   Glucose monitoring:  done 2-3 times a day         Glucometer:  Freestyle/Omnipod       CGM use % of time  thirty-five  2-week average/GV  145/27  Time in range      75%  % Time Above 180  24  % Time above 250   % Time Below 70  1%     PRE-MEAL Fasting Lunch Dinner  12 AM-2 AM Overall  Glucose range:       Averages:  125  148  128  115    POST-MEAL PC Breakfast PC Lunch PC Dinner  Glucose range:     Averages:  176  160  160   Previous data:   PRE-MEAL Fasting Lunch Dinner Bedtime Overall  Glucose range:  116-162  103-234    66-256  Mean/median:  127  148  145   143   POST-MEAL PC Breakfast PC Lunch PC Dinner  Glucose range:    97-206  Mean/median:    152      Dietician visit, most recent: 5/16 Last CDE visit: 12/2016            Weight history: Previous range 200-220  Wt Readings from Last 3 Encounters:  12/16/20 208 lb 3.2 oz (94.4 kg)  12/01/20 205 lb (93 kg)  11/10/20 205 lb (93 kg)    Glycemic control:   Lab Results  Component Value Date   HGBA1C 6.8 (A) 12/16/2020   HGBA1C 7.0 (A) 07/29/2020   HGBA1C 7.2 (A)  04/24/2020   Lab Results  Component Value Date   MICROALBUR <0.7 01/24/2020   LDLCALC 52 07/24/2020   CREATININE 0.76 07/24/2020    Lab Results  Component Value Date   FRUCTOSAMINE 302 (H) 07/20/2017       Allergies as of 12/16/2020   No Known Allergies     Medication List       Accurate as of December 16, 2020 11:00 AM. If you have any questions, ask your nurse or doctor.        aspirin EC 81 MG tablet Take 81 mg by mouth at bedtime.   atorvastatin 40 MG tablet Commonly known as: LIPITOR TAKE 1 TABLET BY MOUTH EVERY DAY AND 1/2 EVERY NIGHT AT BEDTIME   celecoxib 100 MG capsule Commonly known as: CeleBREX Take 1 capsule (100 mg total) by mouth 2 (two) times daily.   diazepam 5 MG tablet Commonly known as: Valium Take 1 tablet (5 mg total) by mouth every 6 (six) hours as needed for muscle spasms.   fluconazole 150 MG tablet Commonly known as: DIFLUCAN 1 tab po q day x 3 days   FREESTYLE LITE test strip Generic drug: glucose blood USE AS INSTRUCTED FOUR TIMES DAILY   gabapentin 300 MG capsule Commonly known as: NEURONTIN Take 300 mg by mouth 2 (two) times daily.   gabapentin 100 MG capsule Commonly known as: NEURONTIN Take 1 capsule (100 mg total) by mouth 3 (three) times daily.   insulin lispro 100 UNIT/ML injection Commonly known as: HUMALOG INJECT UP TO 90 UNITS UNDER THE SKIN DAILY VIA INSULIN PUMP   Insulin Pen Needle 32G X 4 MM Misc Use 5 pens per day to inject insulin   B-D UF III MINI PEN NEEDLES 31G X 5 MM Misc Generic drug: Insulin Pen Needle USE FIVE PER DAY AS DIRECTED   INSULIN SYRINGE .5CC/30GX5/16" 30G X 5/16" 0.5 ML Misc Use 3 times a day for insulin   losartan 50 MG tablet Commonly known as: COZAAR TAKE 1 TABLET(50 MG) BY MOUTH DAILY   meloxicam 15 MG tablet Commonly known as: MOBIC Take 1 tablet (15 mg total) by mouth daily.   NON FORMULARY Take 2 capsules by mouth 2 (two) times daily. 4LIFE TRANSFER FACTORY CARDIO    nystatin cream Commonly known as: MYCOSTATIN Apply 1 application topically 2 (two) times daily.   omeprazole 20 MG capsule Commonly known as: PRILOSEC Take 1  capsule (20 mg total) by mouth daily.   OmniPod 5 Pack Misc CHANGE EVERY 48 HOURS AS DIRECTED   OVER THE COUNTER MEDICATION Take 2 tablets by mouth 2 (two) times daily. 4LIFE TRANSFER FACTORY RECALL   Synjardy XR 03-999 MG Tb24 Generic drug: Empagliflozin-metFORMIN HCl ER Take 2 tablets by mouth daily.   Trulicity 1.5 MG/0.5ML Sopn Generic drug: Dulaglutide INJECT 1.5 MG AS DIRECTED ONCE WEEKLY       Allergies: No Known Allergies  Past Medical History:  Diagnosis Date  . Blood transfusion without reported diagnosis   . Diabetes mellitus without complication (HCC)    type II  . History of ETOH abuse   . Hypertension     Past Surgical History:  Procedure Laterality Date  . BACK SURGERY N/A 2010    Family History  Problem Relation Age of Onset  . Diabetes Unknown   . Hypertension Unknown   . Arthritis Father   . Diabetes Father     Social History:  reports that he has quit smoking. He quit smokeless tobacco use about 27 years ago. He reports that he does not drink alcohol and does not use drugs.    Review of Systems         Lipids:  He has been on Lipitor 40 mg for hypercholesterolemia since about 2011, lipid levels as below         Lab Results  Component Value Date   CHOL 119 07/24/2020   HDL 44 07/24/2020   LDLCALC 52 07/24/2020   LDLDIRECT 44.0 01/24/2020   TRIG 150 (H) 07/24/2020   CHOLHDL 2.7 07/24/2020    Hypertension: Has been treated with losartan 50 mg, prescribed by PCP Also on Jardiance Blood pressure recently consistently controlled  BP Readings from Last 3 Encounters:  12/16/20 138/78  11/10/20 130/70  10/07/20 (!) 155/79    Microalbumin normal as of 3/21   Physical Examination:  BP 138/78   Pulse 69   Ht 5\' 3"  (1.6 m)   Wt 208 lb 3.2 oz (94.4 kg)   SpO2 92%    BMI 36.88 kg/m     ASSESSMENT:  Diabetes type 2, with obesity and BMI of around 36  See history of present illness for  description of current diabetes management, blood sugar patterns and problems identified..  His diabetes is being managed with the Omnipod insulin pump, Synjardy and Trulicity 1.5 mg  His A1c is improved at 6.8, previously was at 7%   Recently with the freestyle libre blood sugars are averaging 145 without any significant hypoglycemia However he tends to have a rise in blood sugar after meals which may be more depending on what he is eating or whether or not he is bolusing on time He still not understanding that if he does not bolus his blood sugar will go up and this was demonstrated to him on his freestyle Also explained that he will have low sugars if he is bolusing late including at night for high sugars  Currently not using freestyle libre much and only in the last 4 days, not clear if this is a consistent pattern  HYPERTENSION: His blood pressure is well controlled on losartan half tablet daily  LIPIDS: Has had control with 40 mg Lipitor and labs are being checked by PCP today   PLAN:  Although he may need a little more insulin early morning will wait till next time to adjust any settings His mealtime boluses overall will be the same  but he will need to adjust them as discussed above He can also increase or decrease his boluses based on total meal size and carbohydrate quantity Enter blood sugars at time of boluses even if he does not do fingerstick Occasionally will compare fingersticks to freestyle libre Regular walking No postprandial bolusing at bedtime If he has any difficulties with his libre sensors he will need to call customer support to troubleshoot and get a replacement if needed  Follow-up in 3 months   There are no Patient Instructions on file for this visit.   Reather Littler 12/16/2020, 11:00 AM   Note: This office note was  prepared with Dragon voice recognition system technology. Any transcriptional errors that result from this process are unintentional.

## 2020-12-22 ENCOUNTER — Other Ambulatory Visit: Payer: Self-pay | Admitting: *Deleted

## 2020-12-29 ENCOUNTER — Other Ambulatory Visit: Payer: Self-pay

## 2020-12-29 ENCOUNTER — Ambulatory Visit: Payer: Medicare Other | Attending: Orthopaedic Surgery | Admitting: Physical Therapy

## 2020-12-29 ENCOUNTER — Other Ambulatory Visit: Payer: Self-pay | Admitting: Medical

## 2020-12-29 DIAGNOSIS — G8929 Other chronic pain: Secondary | ICD-10-CM

## 2020-12-29 DIAGNOSIS — M25662 Stiffness of left knee, not elsewhere classified: Secondary | ICD-10-CM | POA: Diagnosis present

## 2020-12-29 DIAGNOSIS — M6281 Muscle weakness (generalized): Secondary | ICD-10-CM

## 2020-12-29 DIAGNOSIS — M25561 Pain in right knee: Secondary | ICD-10-CM | POA: Insufficient documentation

## 2020-12-29 DIAGNOSIS — R29898 Other symptoms and signs involving the musculoskeletal system: Secondary | ICD-10-CM

## 2020-12-29 DIAGNOSIS — M25562 Pain in left knee: Secondary | ICD-10-CM | POA: Insufficient documentation

## 2020-12-29 NOTE — Therapy (Signed)
[facility] [address]  [address] [place], [place], [address] Phone: [phone]   Fax:  [phone]  Physical Therapy Evaluation  Patient Details  Name: Samuel [name] MRN: [id number] Date of Birth: September 29, 1951 Referring Provider (PT): Doneen Poisson, MD   Encounter Date: 12/29/2020   PT End of Session - 12/29/20 1532    Visit Number 1    Number of Visits 7    Date for PT Re-Evaluation 02/09/21    Authorization Type UHC Medicare    PT Start Time 1457   pt late   PT Stop Time 1528    PT Time Calculation (min) 31 min    Activity Tolerance Patient tolerated treatment well;Patient limited by pain    Behavior During Therapy [facility] for tasks assessed/performed           Past Medical History:  Diagnosis Date  . Blood transfusion without reported diagnosis   . Diabetes mellitus without complication (HCC)    type II  . History of ETOH abuse   . Hypertension     Past Surgical History:  Procedure Laterality Date  . BACK SURGERY N/A 2010    There were no vitals filed for this visit.    Subjective Assessment - 12/29/20 1459    Subjective Patient reporting B knee pain, L>R for about 8 years. Had 2 accidents when he lived in [place] and had 2 surgeries on his back. Pain is located diffusely over B anterior knees without N/T or radiation. Worse when squatting down, standing or sitting for short periods, difficulty sleeping. Pain better with meds.    Patient is accompained by: Interpreter    Pertinent History HTN, hx alcohol abuse, DM, back surgery 2010    Limitations Sitting;Standing;Walking;House hold activities    Diagnostic tests 12/01/20 R knee xray: tricompartmental arthritis with mainly   medial joint space narrowing, patellofemoral narrowing and varus   malalignment; 04/08/20 L knee xray: . Minimal tricompartmental osteophytosis without significant joint space narrowing    Patient Stated Goals decrease pain    Currently  in Pain? Yes    Pain Score 0-No pain    Pain Location Knee    Pain Orientation Right;Left    Pain Descriptors / Indicators Sharp    Pain Type Chronic pain              [facility] PT Assessment - 12/29/20 1506      Assessment   Medical Diagnosis Unilateral primary OA B knees    Referring Provider (PT) Doneen Poisson, MD    Onset Date/Surgical Date 12/29/12    Next MD Visit 01/12/21      Balance Screen   Has the patient fallen in the past 6 months No    Has the patient had a decrease in activity level because of a fear of falling?  No    Is the patient reluctant to leave their home because of a fear of falling?  No      Home Environment   Living Environment Private residence    Living Arrangements Children;Spouse/significant other    Available Help at Discharge Family    Type of Home House    Home Access Level entry    Home Layout One level      Prior Function   Level of Independence Independent    Vocation Retired    Leisure work around [employer]   Overall Cognitive Status Within [employer] for  tasks assessed      Sensation   Light Touch Appears Intact      Coordination   Gross Motor Movements are Fluid and Coordinated Yes      Posture/Postural Control   Posture/Postural Control Postural limitations    Postural Limitations Rounded Shoulders      ROM / Strength   AROM / PROM / Strength AROM;PROM;Strength      AROM   AROM Assessment Site Knee    Right/Left Knee Right;Left    Right Knee Extension 0    Right Knee Flexion 112    Left Knee Extension 0    Left Knee Flexion 110   pain in hip     PROM   PROM Assessment Site Knee    Right/Left Knee Right;Left    Right Knee Extension -2    Right Knee Flexion 117    Left Knee Extension 0    Left Knee Flexion 115   pain in knee     Strength   Strength Assessment Site Hip;Knee;Ankle    Right/Left Hip Right;Left    Right Hip Flexion 4+/5    Right Hip ABduction 4+/5    Right Hip  ADduction 4/5    Left Hip Flexion 4+/5    Left Hip ABduction 4+/5    Left Hip ADduction 4/5    Right/Left Knee Right;Left    Right Knee Flexion 4/5    Right Knee Extension 4+/5    Left Knee Flexion 4-/5   pain in knee and hip   Left Knee Extension 4/5    Right/Left Ankle Right;Left    Right Ankle Dorsiflexion 4/5    Right Ankle Plantar Flexion 4/5    Left Ankle Dorsiflexion 4/5    Left Ankle Plantar Flexion 4/5      Palpation   Palpation comment TTP over L patellar tendon; very mildly edematous over B knees      Ambulation/Gait   Assistive device None    Gait Pattern Step-through pattern;Decreased step length - right;Decreased step length - left;Decreased weight shift to left    Gait velocity slightly decreased                      Objective measurements completed on examination: See above findings.               PT Education - 12/29/20 1531    Education Details prognosis, POC, HEP    Person(s) Educated Patient    Methods Explanation;Demonstration;Tactile cues;Verbal cues;Handout    Comprehension Verbalized understanding            PT Short Term Goals - 12/29/20 1757      PT SHORT TERM GOAL #1   Title Patient to be independent with initial HEP.    Time 3    Period Weeks    Status New    Target Date 01/19/21             PT Long Term Goals - 12/29/20 1758      PT LONG TERM GOAL #1   Title Patient to be independent with advanced HEP.    Time 6    Period Weeks    Status New    Target Date 02/09/21      PT LONG TERM GOAL #2   Title Patient to demonstrate B LE strength >/=4+/5.    Time 6    Period Weeks    Status New    Target Date 02/09/21  PT LONG TERM GOAL #3   Title Patient to demonstrate B knee AROM WFL and without pain limiting.    Time 6    Period Weeks    Status New    Target Date 02/09/21      PT LONG TERM GOAL #4   Title Patient to report 75% improvement in sleep quality d/t decreased pain.    Time 6     Period Weeks    Status New    Target Date 02/09/21                  Plan - 12/29/20 1619    Clinical Impression Statement Patient is a 70 y/o M presenting to OPPT with interpreter with c/o chronic L>R knee pain for the past several years. Pain occurs diffusely over B knees without N/T or radiation. Worse with squatting down, standing or sitting for short periods, and notes difficulty sleeping d/t pain. Patient today presenting with limited and painful B knee ROM, decreased LE strength, TTP over L patellar tendon, edema, and gait deviations. Patient was educated on gentle ROM and strengthening HEP- patient reported understanding. Would benefit from skilled PT services 1x/week for 6 weeks to address aforementioned impairments.    Personal Factors and Comorbidities Age;Comorbidity 3+;Fitness;Past/Current Experience;Profession;Time since onset of injury/illness/exacerbation    Comorbidities HTN, hx alcohol abuse, DM, back surgery 2010    Examination-Activity Limitations Sit;Sleep;Bed Mobility;Bend;Squat;Stairs;Carry;Stand;Dressing;Transfers;Hygiene/Grooming;Lift;Locomotion Level    Examination-Participation Restrictions Church;Cleaning;Community Activity;Shop;Driving;Yard Work;Laundry;Meal Prep    Stability/Clinical Decision Making Stable/Uncomplicated    Clinical Decision Making Low    Rehab Potential Good    PT Frequency 1x / week    PT Duration 6 weeks    PT Treatment/Interventions ADLs/Self Care Home Management;Cryotherapy;Electrical Stimulation;Iontophoresis 4mg /ml Dexamethasone;Moist Heat;Ultrasound;Gait training;Stair training;Functional mobility training;Therapeutic activities;Therapeutic exercise;Balance training;Manual techniques;Patient/family education;Neuromuscular re-education;Passive range of motion;Dry needling;Energy conservation;Taping;Vasopneumatic Device    PT Next Visit Plan reassess HEP; progress LE strengthening and knee flexion ROM    Consulted and Agree with Plan of  Care Patient           Patient will benefit from skilled therapeutic intervention in order to improve the following deficits and impairments:  Hypomobility,Increased edema,Decreased activity tolerance,Decreased strength,Increased fascial restricitons,Pain,Decreased balance,Difficulty walking,Increased muscle spasms,Improper body mechanics,Decreased range of motion,Impaired flexibility,Postural dysfunction  Visit Diagnosis: Chronic pain of left knee  Chronic pain of right knee  Stiffness of left knee, not elsewhere classified  Muscle weakness (generalized)  Other symptoms and signs involving the musculoskeletal system     Problem List Patient Active Problem List   Diagnosis Date Noted  . Lumbar stenosis with neurogenic claudication 07/24/2018  . Colon cancer screening 08/03/2016  . Low back pain 05/24/2016  . Bilateral knee pain 05/24/2016  . Pain of right heel 05/01/2015  . Neuropathy due to secondary diabetes mellitus (HCC) 05/01/2015  . Cough 01/02/2015  . Diabetes (HCC) 10/02/2014  . Essential hypertension, benign 06/17/2014  . Type II or unspecified type diabetes mellitus without mention of complication, not stated as uncontrolled 06/17/2014  . Hyperlipidemia 06/17/2014  . Hip pain 06/17/2014  . Back pain 06/17/2014  . Frequent urination 06/17/2014  . Unspecified asthma(493.90) 06/17/2014  . Asthma 06/17/2014  . Diabetes mellitus type 2, uncontrolled (HCC) 01/26/2012     01/28/2012, PT, DPT 12/29/20 6:00 PM   [facility] [address]  [address] [place], [place], [place] Phone: [phone]   Fax:  [phone]  Name: LINDSAY SOULLIERE MRN: Manuela Schwartz Date of Birth:  02/13/1951  

## 2021-01-01 ENCOUNTER — Other Ambulatory Visit: Payer: Self-pay | Admitting: Endocrinology

## 2021-01-05 ENCOUNTER — Other Ambulatory Visit: Payer: Self-pay

## 2021-01-05 ENCOUNTER — Ambulatory Visit: Payer: Medicare Other | Admitting: Physical Therapy

## 2021-01-05 ENCOUNTER — Encounter: Payer: Self-pay | Admitting: Physical Therapy

## 2021-01-05 DIAGNOSIS — G8929 Other chronic pain: Secondary | ICD-10-CM

## 2021-01-05 DIAGNOSIS — M6281 Muscle weakness (generalized): Secondary | ICD-10-CM

## 2021-01-05 DIAGNOSIS — R29898 Other symptoms and signs involving the musculoskeletal system: Secondary | ICD-10-CM

## 2021-01-05 DIAGNOSIS — M25562 Pain in left knee: Secondary | ICD-10-CM | POA: Diagnosis not present

## 2021-01-05 DIAGNOSIS — M25662 Stiffness of left knee, not elsewhere classified: Secondary | ICD-10-CM

## 2021-01-05 NOTE — Patient Instructions (Signed)
   Cinta de kinesiologa  Qu es la cinta de kinesiologa?  Hay muchas marcas de cintas de kinesiologa. KTape, [employer], [employer], Dynamic tape, por nombrar algunos. Es una cinta elstica diseada para apoyar el proceso de curacin natural del cuerpo. Esta cinta proporciona estabilidad y soporte a los msculos y las articulaciones sin Surveyor, minerals. Tambin puede ayudar a disminuir la hinchazn en el rea de aplicacin.  Como funciona?  La cinta levanta y descomprime microscpicamente la piel para permitir que el drenaje de la linfa (hinchazn) fluya fuera del rea, reduciendo la inflamacin. La cinta tiene la capacidad de ayudar a Systems analyst al dirigirse a receptores especficos en la piel. La presencia de la cinta aumenta la conciencia del cuerpo sobre la postura y la Water quality scientist.  No usar con:  Heridas abiertas Lesiones de la piel Alergias [place]  En algunos casos raros, puede producirse una irritacin cutnea leve o moderada. Esto puede incluir enrojecimiento, picazn o urticaria. Si esto ocurre, retire Chiropodist cinta y consulte a su mdico de atencin primaria si los sntomas son graves o no desaparecen en 2 das.  Extraccin segura de la cinta:  Para quitar la cinta de forma segura, sostenga la piel cercana con una mano y enrolle suavemente la cinta hacia abajo con la [place]. Puede aplicar aceite o acondicionador a la [employer] se ducha antes de quitarla para aflojar el [place]. NO rasgue rpidamente la cinta como una curita, ya que esto podra causar desgarros e irritacin adicional de la piel.   Si tiene preguntas, comunquese con su terapeuta al:  Salud del cono Rehabilitacin para pacientes ambulatorios [employer] [address] [place], [place], [address] Telfono: [phone] Fax: [phone]

## 2021-01-05 NOTE — Therapy (Signed)
[facility] [address]  [address] [place], [place], [address] Phone: [phone]   Fax:  [phone]  Physical Therapy Treatment  Patient Details  Name: Samuel [name] MRN: [id number] Date of Birth: 1951-04-17 Referring Provider (PT): Doneen Poisson, MD   Encounter Date: 01/05/2021   PT End of Session - 01/05/21 0926    Visit Number 2    Number of Visits 7    Date for PT Re-Evaluation 02/09/21    Authorization Type UHC Medicare    PT Start Time 0846    PT Stop Time 0927    PT Time Calculation (min) 41 min    Activity Tolerance Patient tolerated treatment well    Behavior During Therapy [facility] for tasks assessed/performed           Past Medical History:  Diagnosis Date  . Blood transfusion without reported diagnosis   . Diabetes mellitus without complication (HCC)    type II  . History of ETOH abuse   . Hypertension     Past Surgical History:  Procedure Laterality Date  . BACK SURGERY N/A 2010    There were no vitals filed for this visit.   Subjective Assessment - 01/05/21 0847    Subjective Doing alright. Has performed his HEP and denies questions.    Patient is accompained by: Interpreter    Pertinent History HTN, hx alcohol abuse, DM, back surgery 2010    Diagnostic tests 12/01/20 R knee xray: tricompartmental arthritis with mainly   medial joint space narrowing, patellofemoral narrowing and varus   malalignment; 04/08/20 L knee xray: . Minimal tricompartmental osteophytosis without significant joint space narrowing    Patient Stated Goals decrease pain    Currently in Pain? Yes    Pain Score 9     Pain Location Knee    Pain Orientation Left    Pain Descriptors / Indicators Sharp    Pain Type Chronic pain                             [facility] Adult PT Treatment/Exercise - 01/05/21 0001      Exercises   Exercises Knee/Hip      Knee/Hip Exercises: Stretches   Passive Hamstring  Stretch Right;Left;1 rep;30 seconds    Passive Hamstring Stretch Limitations supine with strap    Hip Flexor Stretch Right;Left;1 rep;30 seconds    Hip Flexor Stretch Limitations mod thomas stretch with strap      Knee/Hip Exercises: Aerobic   Recumbent Bike L1 x 6 min   partial/full revolutions d/t L knee pain     Knee/Hip Exercises: Seated   Long Arc Quad Strengthening;Right;Left;1 set;10 reps;Weights    Long Arc Quad Weight 2 lbs.    Long Arc Quad Limitations good TKE      Knee/Hip Exercises: Supine   Heel Slides AAROM;Right;Left;1 set;5 reps    Heel Slides Limitations with strap and peanut ball   heavy manual cueing   Bridges with [employer] Strengthening;1 set;10 reps   limited ROM; c/o pain but tolerable   Straight Leg Raises Strengthening;Left;1 set;10 reps;Right    Straight Leg Raises Limitations no quad lag      Manual Therapy   Manual Therapy Taping    Kinesiotex Create Space      Kinesiotix   Create Space L knee chondramalacia patellae pattern with 50% stretch on long strips, 80% stretch on short strips  PT Education - 01/05/21 0917    Education Details edu on KT tape use, precautions, removal    Person(s) Educated Patient    Methods Explanation;Demonstration;Tactile cues;Verbal cues;Handout    Comprehension Verbalized understanding            PT Short Term Goals - 01/05/21 4174      PT SHORT TERM GOAL #1   Title Patient to be independent with initial HEP.    Time 3    Period Weeks    Status Achieved    Target Date 01/19/21             PT Long Term Goals - 01/05/21 0814      PT LONG TERM GOAL #1   Title Patient to be independent with advanced HEP.    Time 6    Period Weeks    Status On-going      PT LONG TERM GOAL #2   Title Patient to demonstrate B LE strength >/=4+/5.    Time 6    Period Weeks    Status On-going      PT LONG TERM GOAL #3   Title Patient to demonstrate B knee AROM WFL and without pain limiting.     Time 6    Period Weeks    Status On-going      PT LONG TERM GOAL #4   Title Patient to report 75% improvement in sleep quality d/t decreased pain.    Time 6    Period Weeks    Status On-going                 Plan - 01/05/21 4818    Clinical Impression Statement Patient without new complaints at end of session, but with c/o severe L knee pain. Reports compliance with HEP and denies questions. Reviewed HEP for max carryover, with patient requiring minor cueing for correction of form. Initiated gentle stretching, with patient demonstrating good B HS length, but with more tightness in B hip flexors. SLR's were performed well, without evidence of quad lag. Patient reported improvement in knee pain after performing ther-ex. Applied KT tape to L knee for continued pain relief at home. Patient was educated on KT tape wear time, removal, and precautions and reported understanding. Noted no complaints at end of session.    Comorbidities HTN, hx alcohol abuse, DM, back surgery 2010    PT Treatment/Interventions ADLs/Self Care Home Management;Cryotherapy;Electrical Stimulation;Iontophoresis 4mg /ml Dexamethasone;Moist Heat;Ultrasound;Gait training;Stair training;Functional mobility training;Therapeutic activities;Therapeutic exercise;Balance training;Manual techniques;Patient/family education;Neuromuscular re-education;Passive range of motion;Dry needling;Energy conservation;Taping;Vasopneumatic Device    PT Next Visit Plan progress LE strengthening and knee flexion ROM    Consulted and Agree with Plan of Care Patient           Patient will benefit from skilled therapeutic intervention in order to improve the following deficits and impairments:  Hypomobility,Increased edema,Decreased activity tolerance,Decreased strength,Increased fascial restricitons,Pain,Decreased balance,Difficulty walking,Increased muscle spasms,Improper body mechanics,Decreased range of motion,Impaired flexibility,Postural  dysfunction  Visit Diagnosis: Chronic pain of left knee  Chronic pain of right knee  Stiffness of left knee, not elsewhere classified  Muscle weakness (generalized)  Other symptoms and signs involving the musculoskeletal system     Problem List Patient Active Problem List   Diagnosis Date Noted  . Lumbar stenosis with neurogenic claudication 07/24/2018  . Colon cancer screening 08/03/2016  . Low back pain 05/24/2016  . Bilateral knee pain 05/24/2016  . Pain of right heel 05/01/2015  . Neuropathy due to secondary diabetes mellitus (HCC) 05/01/2015  .  Cough 01/02/2015  . Diabetes (HCC) 10/02/2014  . Essential hypertension, benign 06/17/2014  . Type II or unspecified type diabetes mellitus without mention of complication, not stated as uncontrolled 06/17/2014  . Hyperlipidemia 06/17/2014  . Hip pain 06/17/2014  . Back pain 06/17/2014  . Frequent urination 06/17/2014  . Unspecified asthma(493.90) 06/17/2014  . Asthma 06/17/2014  . Diabetes mellitus type 2, uncontrolled (HCC) 01/26/2012     Anette Guarneri, PT, DPT 01/05/21 9:30 AM   [facility] [address]  [address] [place], [place], [address] Phone: [phone]   Fax:  [phone]  Name: MARSHUN DUVA MRN: [id number] Date of Birth: 06-01-1951

## 2021-01-12 ENCOUNTER — Ambulatory Visit (INDEPENDENT_AMBULATORY_CARE_PROVIDER_SITE_OTHER): Payer: Medicare Other | Admitting: Orthopaedic Surgery

## 2021-01-12 ENCOUNTER — Encounter: Payer: Self-pay | Admitting: Orthopaedic Surgery

## 2021-01-12 DIAGNOSIS — M1712 Unilateral primary osteoarthritis, left knee: Secondary | ICD-10-CM

## 2021-01-12 NOTE — Progress Notes (Signed)
The patient is a very pleasant 70 year old gentleman who comes in with interpreter today because he is non-English-speaking.  I saw him last about 6 weeks ago and placed a steroid injection in his left knee to treat the pain from osteoarthritis.  He also has arthritis in his right knee but his left knee is really well bothersome.  He is a diabetic.  He had no adverse effects from the steroid injection on his left knee.  He is still having knee pain though.  He has tried anti-inflammatories and offloading that knee as well.  Is been more of a chronic issue with stiffness in the knee when he first gets up.  He has had therapy as well.  Examination of his left knee shows no effusion today.  There are some global tenderness but good range of motion.  The knee is not hot.  I do feel that given the arthritis in his left knee that he is a candidate for hyaluronic acid.  He has already been through a steroid injection as well as activity modification, physical therapy, anti-inflammatories and quad training exercises.  We will see if we can get hyaluronic acid approved for his left knee to treat the pain from osteoarthritis.  We will be in touch about this.  All questions and concerns were answered and addressed with interpreter.

## 2021-01-13 ENCOUNTER — Other Ambulatory Visit: Payer: Self-pay

## 2021-01-13 ENCOUNTER — Telehealth: Payer: Self-pay

## 2021-01-13 ENCOUNTER — Ambulatory Visit: Payer: Medicare Other | Attending: Orthopaedic Surgery

## 2021-01-13 DIAGNOSIS — G8929 Other chronic pain: Secondary | ICD-10-CM | POA: Insufficient documentation

## 2021-01-13 DIAGNOSIS — M6281 Muscle weakness (generalized): Secondary | ICD-10-CM | POA: Diagnosis present

## 2021-01-13 DIAGNOSIS — M25561 Pain in right knee: Secondary | ICD-10-CM | POA: Insufficient documentation

## 2021-01-13 DIAGNOSIS — M25662 Stiffness of left knee, not elsewhere classified: Secondary | ICD-10-CM | POA: Diagnosis present

## 2021-01-13 DIAGNOSIS — M25562 Pain in left knee: Secondary | ICD-10-CM | POA: Diagnosis present

## 2021-01-13 NOTE — Telephone Encounter (Signed)
Left knee gel injection ?

## 2021-01-13 NOTE — Therapy (Signed)
[facility] [address]  [address] [place], [place], [address] Phone: [phone]   Fax:  [phone]  Physical Therapy Treatment  Patient Details  Name: Samuel [name] MRN: [id number] Date of Birth: 29-Jun-1951 Referring Provider (PT): Doneen Poisson, MD   Encounter Date: 01/13/2021   PT End of Session - 01/13/21 1623    Visit Number 3    Number of Visits 7    Date for PT Re-Evaluation 02/09/21    Authorization Type UHC Medicare    PT Start Time 1431    PT Stop Time 1511    PT Time Calculation (min) 40 min    Activity Tolerance Patient tolerated treatment well    Behavior During Therapy [facility] for tasks assessed/performed           Past Medical History:  Diagnosis Date  . Blood transfusion without reported diagnosis   . Diabetes mellitus without complication (HCC)    type II  . History of ETOH abuse   . Hypertension     Past Surgical History:  Procedure Laterality Date  . BACK SURGERY N/A 2010    There were no vitals filed for this visit.   Subjective Assessment - 01/13/21 1533    Subjective Pt reports his home exercises are going well with no new complaints    Patient is accompained by: Interpreter    Pertinent History HTN, hx alcohol abuse, DM, back surgery 2010    Diagnostic tests 12/01/20 R knee xray: tricompartmental arthritis with mainly   medial joint space narrowing, patellofemoral narrowing and varus   malalignment; 04/08/20 L knee xray: . Minimal tricompartmental osteophytosis without significant joint space narrowing    Patient Stated Goals decrease pain    Currently in Pain? Yes    Pain Score 9     Pain Location Knee    Pain Orientation Left    Pain Descriptors / Indicators Sharp    Pain Type Chronic pain                             [facility] Adult PT Treatment/Exercise - 01/13/21 0001      Exercises   Exercises Knee/Hip      Knee/Hip Exercises: Aerobic   Recumbent Bike  partial rev 6 min      Knee/Hip Exercises: Standing   Hip Abduction Stengthening;Both;2 sets;10 reps;Knee straight    Abduction Limitations 2 HHA    Hip Extension Stengthening;Both;2 sets;10 reps;Knee straight    Extension Limitations 2 HHA      Knee/Hip Exercises: Seated   Long Arc Quad Strengthening;Both;2 sets;10 reps;Weights    Long Arc Quad Weight 2 lbs.    Sit to Sand 2 sets;10 reps;with UE support      Knee/Hip Exercises: Supine   Bridges Strengthening;Both;1 set;10 reps    Bridges Limitations limited ROM    Straight Leg Raises Strengthening;Both;1 set;10 reps    Straight Leg Raises Limitations good quad control                    PT Short Term Goals - 01/05/21 0737      PT SHORT TERM GOAL #1   Title Patient to be independent with initial HEP.    Time 3    Period Weeks    Status Achieved    Target Date 01/19/21             PT Long Term Goals -  01/05/21 0928      PT LONG TERM GOAL #1   Title Patient to be independent with advanced HEP.    Time 6    Period Weeks    Status On-going      PT LONG TERM GOAL #2   Title Patient to demonstrate B LE strength >/=4+/5.    Time 6    Period Weeks    Status On-going      PT LONG TERM GOAL #3   Title Patient to demonstrate B knee AROM WFL and without pain limiting.    Time 6    Period Weeks    Status On-going      PT LONG TERM GOAL #4   Title Patient to report 75% improvement in sleep quality d/t decreased pain.    Time 6    Period Weeks    Status On-going                 Plan - 01/13/21 1624    Clinical Impression Statement Progressed pt to more standing ther ex this session. He did need rest with the standing hip exercises along with cues to prevent trunk leaning. Had a mild c/o knee pain during STS but reported that it was tolerable. Pt showed good knee ROM during exercises, did need some cues for quad lag and to keep TKE in knee during SLR. He had no increase in pain during session and  reported that he felt good about everything today.    Personal Factors and Comorbidities Age;Comorbidity 3+;Fitness;Past/Current Experience;Profession;Time since onset of injury/illness/exacerbation    Comorbidities HTN, hx alcohol abuse, DM, back surgery 2010    PT Frequency 1x / week    PT Duration 6 weeks    PT Treatment/Interventions ADLs/Self Care Home Management;Cryotherapy;Electrical Stimulation;Iontophoresis 4mg /ml Dexamethasone;Moist Heat;Ultrasound;Gait training;Stair training;Functional mobility training;Therapeutic activities;Therapeutic exercise;Balance training;Manual techniques;Patient/family education;Neuromuscular re-education;Passive range of motion;Dry needling;Energy conservation;Taping;Vasopneumatic Device    PT Next Visit Plan progress LE strengthening and knee flexion ROM    Consulted and Agree with Plan of Care Patient           Patient will benefit from skilled therapeutic intervention in order to improve the following deficits and impairments:  Hypomobility,Increased edema,Decreased activity tolerance,Decreased strength,Increased fascial restricitons,Pain,Decreased balance,Difficulty walking,Increased muscle spasms,Improper body mechanics,Decreased range of motion,Impaired flexibility,Postural dysfunction  Visit Diagnosis: Chronic pain of left knee  Chronic pain of right knee  Stiffness of left knee, not elsewhere classified  Muscle weakness (generalized)     Problem List Patient Active Problem List   Diagnosis Date Noted  . Lumbar stenosis with neurogenic claudication 07/24/2018  . Colon cancer screening 08/03/2016  . Low back pain 05/24/2016  . Bilateral knee pain 05/24/2016  . Pain of right heel 05/01/2015  . Neuropathy due to secondary diabetes mellitus (HCC) 05/01/2015  . Cough 01/02/2015  . Diabetes (HCC) 10/02/2014  . Essential hypertension, benign 06/17/2014  . Type II or unspecified type diabetes mellitus without mention of complication, not  stated as uncontrolled 06/17/2014  . Hyperlipidemia 06/17/2014  . Hip pain 06/17/2014  . Back pain 06/17/2014  . Frequent urination 06/17/2014  . Unspecified asthma(493.90) 06/17/2014  . Asthma 06/17/2014  . Diabetes mellitus type 2, uncontrolled (HCC) 01/26/2012    01/28/2012, PTA 01/13/2021, 5:13 PM  [facility] [address]  [address] [place], [place], [place] Phone: [phone]   Fax:  [phone]  Name: Samuel [name] MRN: Manuela Schwartz Date of Birth: May 07, 1951

## 2021-01-13 NOTE — Telephone Encounter (Signed)
Noted  

## 2021-01-16 ENCOUNTER — Other Ambulatory Visit: Payer: Self-pay | Admitting: Endocrinology

## 2021-01-19 ENCOUNTER — Telehealth: Payer: Self-pay

## 2021-01-19 NOTE — Telephone Encounter (Signed)
Submitted VOB for Durolane, left knee. Pending BV. 

## 2021-01-20 ENCOUNTER — Other Ambulatory Visit: Payer: Self-pay

## 2021-01-20 ENCOUNTER — Ambulatory Visit: Payer: Medicare Other

## 2021-01-20 DIAGNOSIS — M25562 Pain in left knee: Secondary | ICD-10-CM | POA: Diagnosis not present

## 2021-01-20 DIAGNOSIS — M6281 Muscle weakness (generalized): Secondary | ICD-10-CM

## 2021-01-20 DIAGNOSIS — M25662 Stiffness of left knee, not elsewhere classified: Secondary | ICD-10-CM

## 2021-01-20 DIAGNOSIS — G8929 Other chronic pain: Secondary | ICD-10-CM

## 2021-01-20 NOTE — Therapy (Addendum)
[facility] [address]  [place], [place], [address] Phone: [phone]   Fax:  [phone]  Physical Therapy Treatment  Patient Details  Name: Samuel [name] MRN: [id number] Date of Birth: 02-Nov-1951 Referring Provider (PT): Jean Rosenthal, MD  Progress Note Reporting Period 12/29/20 to 01/20/21  See note below for Objective Data and Assessment of Progress/Goals.     Encounter Date: 01/20/2021   PT End of Session - 01/20/21 1624    Visit Number 4    Number of Visits 7    Date for PT Re-Evaluation 02/09/21    Authorization Type UHC Medicare    PT Start Time 3790    PT Stop Time 1612    PT Time Calculation (min) 41 min    Activity Tolerance Patient tolerated treatment well    Behavior During Therapy WFL for tasks assessed/performed           Past Medical History:  Diagnosis Date  . Blood transfusion without reported diagnosis   . Diabetes mellitus without complication ([place])    type II  . History of ETOH abuse   . Hypertension     Past Surgical History:  Procedure Laterality Date  . BACK SURGERY N/A 2010    There were no vitals filed for this visit.   Subjective Assessment - 01/20/21 1535    Subjective Pt reports he is feeling better since last session.    Pertinent History HTN, hx alcohol abuse, DM, back surgery 2010    Diagnostic tests 12/01/20 R knee xray: tricompartmental arthritis with mainly   medial joint space narrowing, patellofemoral narrowing and varus   malalignment; 04/08/20 L knee xray: . Minimal tricompartmental osteophytosis without significant joint space narrowing    Currently in Pain? Yes    Pain Score 5     Pain Location Knee    Pain Orientation Left    Pain Descriptors / Indicators Sharp    Pain Type Chronic pain                             [facility] Adult PT Treatment/Exercise - 01/20/21 0001      Exercises   Exercises Knee/Hip      Knee/Hip  Exercises: Aerobic   Recumbent Bike full rev on seat level 4 6 min      Knee/Hip Exercises: Standing   Hip Abduction Stengthening;Both;1 set;10 reps;Knee straight    Abduction Limitations 2 HHA; G tband    Hip Extension Stengthening;Both;1 set;10 reps;Knee straight    Extension Limitations 2 HHA; G tband    Lateral Step Up Left;2 sets;10 reps;Hand Hold: 1;Step Height: 6"    Forward Step Up Left;2 sets;10 reps;Hand Hold: 2;Step Height: 6"      Knee/Hip Exercises: Seated   Long Arc Quad Strengthening;Left;2 sets;10 reps;Weights    Long Arc Quad Weight 3 lbs.    Hamstring Curl Strengthening;Left;2 sets;10 reps;Limitations    Hamstring Limitations R tband    Sit to Sand 2 sets;10 reps;with UE support                  PT Education - 01/20/21 1617    Education Details HEP update; Access Code: [email or web address]    Person(s) Educated Patient    Methods Explanation;Demonstration;Tactile cues;Verbal cues;Handout    Comprehension Verbalized understanding;Returned demonstration;Verbal cues required;Need further instruction            PT Short Term Goals - 01/05/21  0928      PT SHORT TERM GOAL #1   Title Patient to be independent with initial HEP.    Time 3    Period Weeks    Status Achieved    Target Date 01/19/21             PT Long Term Goals - 01/05/21 1540      PT LONG TERM GOAL #1   Title Patient to be independent with advanced HEP.    Time 6    Period Weeks    Status On-going      PT LONG TERM GOAL #2   Title Patient to demonstrate B LE strength >/=4+/5.    Time 6    Period Weeks    Status On-going      PT LONG TERM GOAL #3   Title Patient to demonstrate B knee AROM WFL and without pain limiting.    Time 6    Period Weeks    Status On-going      PT LONG TERM GOAL #4   Title Patient to report 75% improvement in sleep quality d/t decreased pain.    Time 6    Period Weeks    Status On-going                 Plan - 01/20/21 1625    Clinical  Impression Statement Pt had a good response to standing ther ex, cues were given to prevent any substitutions and to properly execute exercises. He still has discomfort when bending his knees too far, had a c/o pain during the hamstring curls but instructed pt on staying away from painful ROM. Otherwise he had no other complaints during sessions and even stated that the exercises feel like they are making him better.    Personal Factors and Comorbidities Age;Comorbidity 3+;Fitness;Past/Current Experience;Profession;Time since onset of injury/illness/exacerbation    Comorbidities HTN, hx alcohol abuse, DM, back surgery 2010    PT Frequency 1x / week    PT Duration 6 weeks    PT Treatment/Interventions ADLs/Self Care Home Management;Cryotherapy;Electrical Stimulation;Iontophoresis 25m/ml Dexamethasone;Moist Heat;Ultrasound;Gait training;Stair training;Functional mobility training;Therapeutic activities;Therapeutic exercise;Balance training;Manual techniques;Patient/family education;Neuromuscular re-education;Passive range of motion;Dry needling;Energy conservation;Taping;Vasopneumatic Device    PT Next Visit Plan progress LE strengthening and knee flexion ROM    Consulted and Agree with Plan of Care Patient           Patient will benefit from skilled therapeutic intervention in order to improve the following deficits and impairments:  Hypomobility,Increased edema,Decreased activity tolerance,Decreased strength,Increased fascial restricitons,Pain,Decreased balance,Difficulty walking,Increased muscle spasms,Improper body mechanics,Decreased range of motion,Impaired flexibility,Postural dysfunction  Visit Diagnosis: Chronic pain of left knee  Stiffness of left knee, not elsewhere classified  Muscle weakness (generalized)     Problem List Patient Active Problem List   Diagnosis Date Noted  . Lumbar stenosis with neurogenic claudication 07/24/2018  . Colon cancer screening 08/03/2016  . Low  back pain 05/24/2016  . Bilateral knee pain 05/24/2016  . Pain of right heel 05/01/2015  . Neuropathy due to secondary diabetes mellitus ([place] 05/01/2015  . Cough 01/02/2015  . Diabetes ([facility] 10/02/2014  . Essential hypertension, benign 06/17/2014  . Type II or unspecified type diabetes mellitus without mention of complication, not stated as uncontrolled 06/17/2014  . Hyperlipidemia 06/17/2014  . Hip pain 06/17/2014  . Back pain 06/17/2014  . Frequent urination 06/17/2014  . Unspecified asthma(493.90) 06/17/2014  . Asthma 06/17/2014  . Diabetes mellitus type 2, uncontrolled ([place] 01/26/2012    Braylin LRenaldo Harrison  PTA 01/20/2021, 4:58 PM  [facility] [address]  [place], [place], [address] Phone: [phone]   Fax:  [phone]  Name: BRAIAN TIJERINA MRN: [id number] Date of Birth: 08-Apr-1951  PHYSICAL THERAPY DISCHARGE SUMMARY  Visits from Start of Care: 4  Current functional level related to goals / functional outcomes: Unable to assess; patient requested DC d/t going on a long vacation   Remaining deficits: Unable to assess   Education / Equipment: HEP  Plan: Patient agrees to discharge.  Patient goals were not met. Patient is being discharged due to the patient's request.  ?????     Janene Harvey, PT, DPT 02/23/21 11:29 AM

## 2021-01-20 NOTE — Patient Instructions (Signed)
Access Code: [email or web address] URL: [email or web address] Date: 01/20/2021 Prepared by: Verta Ellen  Exercises Standing Hip Abduction with Resistance at Ankles and Counter Support - 2 x daily - 7 x weekly - 2 sets - 10 reps Standing Hip Extension Kicks - 2 x daily - 7 x weekly - 2 sets - 10 reps

## 2021-02-02 ENCOUNTER — Telehealth: Payer: Self-pay

## 2021-02-02 ENCOUNTER — Encounter: Payer: Self-pay | Admitting: Orthopaedic Surgery

## 2021-02-02 ENCOUNTER — Ambulatory Visit (INDEPENDENT_AMBULATORY_CARE_PROVIDER_SITE_OTHER): Payer: Medicare Other | Admitting: Orthopaedic Surgery

## 2021-02-02 DIAGNOSIS — M1712 Unilateral primary osteoarthritis, left knee: Secondary | ICD-10-CM | POA: Insufficient documentation

## 2021-02-02 MED ORDER — SODIUM HYALURONATE 60 MG/3ML IX PRSY
60.0000 mg | PREFILLED_SYRINGE | INTRA_ARTICULAR | Status: AC | PRN
  Administered 2021-02-02: 60 mg via INTRA_ARTICULAR

## 2021-02-02 NOTE — Progress Notes (Signed)
   Procedure Note  Patient: Samuel [name]             Date of Birth: [birth year]           MRN: [id number]             Visit Date: 02/02/2021  Procedures: Visit Diagnoses:  1. Unilateral primary osteoarthritis, left knee     Large Joint Inj: L knee on 02/02/2021 10:57 AM Indications: diagnostic evaluation and pain Details: 22 G 1.5 in needle, superolateral approach  Arthrogram: No  Medications: 60 mg Sodium Hyaluronate 60 MG/3ML Outcome: tolerated well, no immediate complications Procedure, treatment alternatives, risks and benefits explained, specific risks discussed. Consent was given by the patient. Immediately prior to procedure a time out was called to verify the correct patient, procedure, equipment, support staff and site/side marked as required. Patient was prepped and draped in the usual sterile fashion.    The patient is here today for scheduled hyaluronic acid injection with Durolane to treat the pain from osteoarthritis in the left knee.  He has tried and failed other forms of conservative treatment with that left knee including steroid injections.  He has a [place] interpreter here with him today as well.  He understands fully why he is having this type of injection.  His knee on exam today shows no effusion.  There is global tenderness of the left knee.  I did place Durolane in the left knee today without difficulty.  We can certainly see him back in 2 months to see if this is helped.  He has been dealing with right knee pain as well.

## 2021-02-02 NOTE — Telephone Encounter (Signed)
Approved for Durolane, left knee. Buy & Bill Patient will be responsible for 20% OOP. Co-pay of $35.00 No PA required

## 2021-02-03 ENCOUNTER — Encounter: Payer: Medicare Other | Admitting: Physical Therapy

## 2021-02-05 ENCOUNTER — Other Ambulatory Visit: Payer: Self-pay | Admitting: Endocrinology

## 2021-03-19 ENCOUNTER — Other Ambulatory Visit: Payer: Self-pay

## 2021-03-19 ENCOUNTER — Encounter: Payer: Self-pay | Admitting: Endocrinology

## 2021-03-19 ENCOUNTER — Ambulatory Visit: Payer: Medicare Other | Admitting: Endocrinology

## 2021-03-19 VITALS — BP 148/72 | HR 78 | Ht 63.0 in | Wt 205.0 lb

## 2021-03-19 DIAGNOSIS — Z794 Long term (current) use of insulin: Secondary | ICD-10-CM

## 2021-03-19 DIAGNOSIS — E1165 Type 2 diabetes mellitus with hyperglycemia: Secondary | ICD-10-CM | POA: Diagnosis not present

## 2021-03-19 DIAGNOSIS — I10 Essential (primary) hypertension: Secondary | ICD-10-CM

## 2021-03-19 LAB — POCT GLYCOSYLATED HEMOGLOBIN (HGB A1C): Hemoglobin A1C: 7.1 % — AB (ref 4.0–5.6)

## 2021-03-19 NOTE — Progress Notes (Signed)
Patient ID: Samuel [name], male   DOB: 11-26-1950, 70 y.o.   MRN: [id number]            Reason for Appointment:  Followup for Type 2 Diabetes  Referring physician: Saguier  History of Present Illness:          Diagnosis: Type 2 diabetes mellitus, date of diagnosis:  1990      Past history: He thinks he has been on insulin for the last 10-12 years. Previously had been on metformin which has been continued. He was probably tried on different insulin regimens initially but has been taking 70/30 mostly because of cost Previous records are not available and appears that his sugars have been poorly controlled for several years He was  referred here because of an A1c in 8/15 of 9.7%. He was on a regimen of Novolin 70/30 twice a day but because of inadequate control and higher readings later in the day he was switched to basal bolus insulin regimen in 12/15. Previous insulin regimen: Humalog  ac meals 35-40-30 Toujeo 35-40 units daily  Recent history:   INSULIN regimen is: OMNIPOD INSULIN PUMP  Basal rate settings: Midnight = 1.3, 6am: 1.65, 5 PM = 1.6.  Boluses 12 units breakfast -14 units lunch and 18 units at dinner at meals Total daily insulin up to 80 units  Carbohydrate coverage 1:10 and correction 1:50 Usual mealtime boluses: 12 units at breakfast, 14 at lunch and 18 at dinner   Non-insulin hypoglycemic drugs: Metformin 1 g twice a day, Trulicity 1.5 mg weekly, Synjardy XR 03/999, 2 tablets daily  A1c is now 7.1  Current basal insulin about 36 units and bolus 20 units/day  Current management, blood sugar patterns and problems identified:  He has stopped using the freestyle libre stating that he was told he is not due for refills  He also thinks that the sensors do not work for 14 days and has not contacted the support for help  Again he is somewhat afraid of bolusing at mealtimes when his blood sugars are near normal  He does not put in his blood sugar readings when he is  doing any boluses for enabling corrections  However appears to be putting in arbitrary bolus amounts between 10-18 units at different times  Highest blood sugar recently was 273 postprandially and he gave 2 boluses of 18 and 10 units for this causing his blood sugar to be low  Currently however still trying to check sugars 4 times a day  FASTING blood sugars appear to be very close to normal consistently  He likely has good blood sugars in the evenings when he takes his bolus on time but most of his high readings are high in the evenings, unclear which readings are postprandial  He has been continuing his Trulicity consistently  Weight is down 3 pounds  No hypoglycemia  His mealtimes are generally variable, evening meal 5-7 PM       Side effects from medications have been: ?  Nausea/dizziness on Victoza     PRE-MEAL Fasting  midday  early evening Bedtime Overall  Glucose range:  108-162  112-317  115-253  49-208   Averages:     165   Data from [place] previously  CGM use % of time  thirty-five  2-week average/GV  145/27  Time in range      75%  % Time Above 180  24  % Time above 250   % Time Below 70  1%  PRE-MEAL Fasting Lunch Dinner  12 AM-2 AM Overall  Glucose range:       Averages:  125  148  128  115    POST-MEAL PC Breakfast PC Lunch PC Dinner  Glucose range:     Averages:  176  160  160      Dietician visit, most recent: 5/16 Last CDE visit: 12/2016            Weight history: Previous range 200-220  Wt Readings from Last 3 Encounters:  03/19/21 205 lb (93 kg)  12/16/20 208 lb 3.2 oz (94.4 kg)  12/01/20 205 lb (93 kg)    Glycemic control:   Lab Results  Component Value Date   HGBA1C 7.1 (A) 03/19/2021   HGBA1C 6.8 (A) 12/16/2020   HGBA1C 7.0 (A) 07/29/2020   Lab Results  Component Value Date   MICROALBUR <0.7 12/16/2020   LDLCALC 37 12/16/2020   CREATININE 0.83 12/16/2020    Lab Results  Component Value Date   FRUCTOSAMINE 302 (H)  07/20/2017       Allergies as of 03/19/2021   No Known Allergies     Medication List       Accurate as of Mar 19, 2021  1:46 PM. If you have any questions, ask your nurse or doctor.        aspirin EC 81 MG tablet Take 81 mg by mouth at bedtime.   atorvastatin 40 MG tablet Commonly known as: LIPITOR TAKE 1 TABLET BY MOUTH EVERY DAY AND 1/2 EVERY NIGHT AT BEDTIME   celecoxib 100 MG capsule Commonly known as: CeleBREX Take 1 capsule (100 mg total) by mouth 2 (two) times daily.   diazepam 5 MG tablet Commonly known as: Valium Take 1 tablet (5 mg total) by mouth every 6 (six) hours as needed for muscle spasms.   fluconazole 150 MG tablet Commonly known as: DIFLUCAN 1 tab po q day x 3 days   FREESTYLE LITE test strip Generic drug: glucose blood USE AS INSTRUCTED FOUR TIMES DAILY   gabapentin 300 MG capsule Commonly known as: NEURONTIN Take 300 mg by mouth 2 (two) times daily.   gabapentin 100 MG capsule Commonly known as: NEURONTIN TAKE 1 CAPSULE(100 MG) BY MOUTH THREE TIMES DAILY   insulin lispro 100 UNIT/ML injection Commonly known as: HUMALOG INJECT UP TO 90 UNITS UNDER THE SKIN DAILY VIA INSULIN PUMP   Insulin Pen Needle 32G X 4 MM Misc Use 5 pens per day to inject insulin   B-D UF III MINI PEN NEEDLES 31G X 5 MM Misc Generic drug: Insulin Pen Needle USE FIVE PER DAY AS DIRECTED   INSULIN SYRINGE .5CC/30GX5/16" 30G X 5/16" 0.5 ML Misc Use 3 times a day for insulin   losartan 50 MG tablet Commonly known as: COZAAR TAKE 1 TABLET(50 MG) BY MOUTH DAILY   meloxicam 15 MG tablet Commonly known as: MOBIC Take 1 tablet (15 mg total) by mouth daily.   NON FORMULARY Take 2 capsules by mouth 2 (two) times daily. 4LIFE TRANSFER FACTORY CARDIO   nystatin cream Commonly known as: MYCOSTATIN Apply 1 application topically 2 (two) times daily.   omeprazole 20 MG capsule Commonly known as: PRILOSEC Take 1 capsule (20 mg total) by mouth daily.   Omnipod  Classic Pods (Gen 3) Misc CHANGE EVERY 48 HOURS AS DIRECTED   OVER THE COUNTER MEDICATION Take 2 tablets by mouth 2 (two) times daily. 4LIFE TRANSFER FACTORY RECALL   Synjardy XR 03-999 MG Tb24 Generic  drug: Empagliflozin-metFORMIN HCl ER TAKE 2 TABLETS BY MOUTH DAILY   Trulicity 1.5 MG/0.5ML Sopn Generic drug: Dulaglutide ADMINISTER 1.5 MG UNDER THE SKIN 1 TIME WEEKLY AS DIRECTED       Allergies: No Known Allergies  Past Medical History:  Diagnosis Date  . Blood transfusion without reported diagnosis   . Diabetes mellitus without complication (HCC)    type II  . History of ETOH abuse   . Hypertension     Past Surgical History:  Procedure Laterality Date  . BACK SURGERY N/A 2010    Family History  Problem Relation Age of Onset  . Diabetes Unknown   . Hypertension Unknown   . Arthritis Father   . Diabetes Father     Social History:  reports that he has quit smoking. He quit smokeless tobacco use about 27 years ago. He reports that he does not drink alcohol and does not use drugs.    Review of Systems         Lipids:  He has been on Lipitor 40 mg for hypercholesterolemia since about 2011, lipid levels as below         Lab Results  Component Value Date   CHOL 96 12/16/2020   HDL 42.40 12/16/2020   LDLCALC 37 12/16/2020   LDLDIRECT 44.0 01/24/2020   TRIG 82.0 12/16/2020   CHOLHDL 2 12/16/2020    Hypertension: Has been treated with losartan 50 mg, prescribed by PCP Also on Jardiance Blood pressure relatively higher today  BP Readings from Last 3 Encounters:  03/19/21 (!) 148/72  12/16/20 138/78  11/10/20 130/70    Microalbumin normal as of 3/21   Physical Examination:  BP (!) 148/72   Pulse 78   Ht 5\' 3"  (1.6 m)   Wt 205 lb (93 kg)   SpO2 96%   BMI 36.31 kg/m     ASSESSMENT:  Diabetes type 2, with obesity and BMI of around 36  See history of present illness for  description of current diabetes management, blood sugar patterns and  problems identified..  His diabetes is being managed with the Omnipod insulin pump, Synjardy and Trulicity 1.5 mg  His A1c is 7.1  Recently with his blood sugar monitoring at home with fingersticks his blood sugars are averaging 165 A1c is lower than expected Most of his high blood sugars are related to late boluses Highest blood sugars are in the evenings likely when he is late for his mealtime boluses and not clear if he needs another increase in basal rate around 4-6 PM  Currently not using freestyle libre and he needs to find out why he is not able to get the refills as also discussed with the company any issues with the sensor not lasting 14 days   HYPERTENSION: His blood pressure is relatively higher May consider 100 mg of losartan, will defer to PCP    PLAN:  He will review his pump management with nurse educator again Carbohydrate coverage may be changed to 1: 1 so that he can enter the actual units in the carbohydrate field Correction factor I: 40 instead of 50, this was changed He will also need to coordinate blood sugar at the time of each bolus and showed him how to do this Restart freestyle libre Needs to bolus when he starts eating consistently regardless of blood sugar level Discussed that his blood sugars will go up if he does not bolus Blood sugars will not down when he is eating a meal and bolusing  Otherwise he needs to stop bolusing late in the evenings for high readings Continue Trulicity  Follow-up in 3 months   Patient Instructions  Always bolus when you eat (before eating) using 14 for average meal33  Call [facility] if Josephine Igo does not work well        Reather Littler 03/19/2021, 1:46 PM   Note: This office note was prepared with Dragon voice recognition system technology. Any transcriptional errors that result from this process are unintentional.

## 2021-03-19 NOTE — Patient Instructions (Signed)
Always bolus when you eat (before eating) using 14 for average meal33  Call [facility] if Samuel [name] does not work well

## 2021-03-22 ENCOUNTER — Other Ambulatory Visit: Payer: Self-pay | Admitting: Medical

## 2021-03-23 ENCOUNTER — Other Ambulatory Visit: Payer: Self-pay | Admitting: Medical

## 2021-04-09 ENCOUNTER — Ambulatory Visit: Payer: Medicare Other | Admitting: Orthopaedic Surgery

## 2021-04-22 ENCOUNTER — Other Ambulatory Visit: Payer: Self-pay

## 2021-04-22 ENCOUNTER — Encounter: Payer: Self-pay | Admitting: Orthopaedic Surgery

## 2021-04-22 ENCOUNTER — Ambulatory Visit: Payer: Medicare Other | Admitting: Orthopaedic Surgery

## 2021-04-22 DIAGNOSIS — M25562 Pain in left knee: Secondary | ICD-10-CM | POA: Diagnosis not present

## 2021-04-22 DIAGNOSIS — M1711 Unilateral primary osteoarthritis, right knee: Secondary | ICD-10-CM

## 2021-04-22 DIAGNOSIS — M1712 Unilateral primary osteoarthritis, left knee: Secondary | ICD-10-CM

## 2021-04-22 DIAGNOSIS — G8929 Other chronic pain: Secondary | ICD-10-CM

## 2021-04-22 DIAGNOSIS — M25561 Pain in right knee: Secondary | ICD-10-CM | POA: Diagnosis not present

## 2021-04-22 NOTE — Progress Notes (Signed)
The patient is a very pleasant 70 year old gentleman off seen before.  He is not an [place] speaking and does have a Spanish interpreter with him today.  I have been seeing him for both his knees.  He has arthritis in both his knees.  The x-rays show it looks worse on the right side but most of his pain is more on the left side.  He is a diabetic and I see that his hemoglobin A1c has gone up to 7.1 last month.  He understands that steroid injections can detrimentally affect this.  He reports that he has been through physical therapy for his knees and he does try topical anti-inflammatory medications.  Recently Advil has helped the most.  I described to the interpreter how to take this Advil.  He has had no other acute changes in medical status.  Most of his pain occurs at night and its with the left knee more than the right.  Both knees have some slight varus malalignment.  Both knees have global tenderness with good range of motion and are ligamentously stable.  At this point I would not recommend anything else other than continued quad training exercises as well as conservative treatment with over-the-counter arthritis medications and anti-inflammatories as well as weight loss.  He should still work on better blood glucose control.  We can always see him back in 3 months to consider repeat injections if needed.  All questions and concerns were answered and addressed through the interpreter.

## 2021-05-06 ENCOUNTER — Other Ambulatory Visit: Payer: Self-pay | Admitting: Endocrinology

## 2021-05-20 ENCOUNTER — Other Ambulatory Visit: Payer: Self-pay | Admitting: Medical

## 2021-06-03 ENCOUNTER — Other Ambulatory Visit: Payer: Self-pay

## 2021-06-03 ENCOUNTER — Ambulatory Visit (INDEPENDENT_AMBULATORY_CARE_PROVIDER_SITE_OTHER): Payer: Medicare Other | Admitting: Medical

## 2021-06-03 ENCOUNTER — Ambulatory Visit (HOSPITAL_BASED_OUTPATIENT_CLINIC_OR_DEPARTMENT_OTHER)
Admission: RE | Admit: 2021-06-03 | Discharge: 2021-06-03 | Disposition: A | Discharge status: Home / Self Care | Payer: Medicare Other | Source: Ambulatory Visit | Attending: Medical | Admitting: Medical

## 2021-06-03 VITALS — BP 136/78 | HR 55 | Temp 98.3°F | Resp 18 | Ht 63.0 in | Wt 204.0 lb

## 2021-06-03 DIAGNOSIS — M25559 Pain in unspecified hip: Secondary | ICD-10-CM | POA: Insufficient documentation

## 2021-06-03 DIAGNOSIS — M25569 Pain in unspecified knee: Secondary | ICD-10-CM

## 2021-06-03 DIAGNOSIS — G8929 Other chronic pain: Secondary | ICD-10-CM | POA: Diagnosis not present

## 2021-06-03 DIAGNOSIS — I10 Essential (primary) hypertension: Secondary | ICD-10-CM

## 2021-06-03 MED ORDER — TRAMADOL HCL 50 MG PO TABS: 50.0000 mg | ORAL_TABLET | Freq: Four times a day (QID) | ORAL | 0 refills | Status: AC | PRN

## 2021-06-03 NOTE — Progress Notes (Signed)
Subjective:    Patient ID: Samuel [name], male    DOB: August 09, 1951, 70 y.o.   MRN: [id number]  HPI  Pt in for some recent left knee pain. Hx of knee pain in past. Pt states he has upcoming appointment in august with orthopedist. This is with Dr. Magnus Ivan.   Last visit A/P with ortho below in march " "The patient is here today for scheduled hyaluronic acid injection with Durolane to treat the pain from osteoarthritis in the left knee.  He has tried and failed other forms of conservative treatment with that left knee including steroid injections.  He has a [place] interpreter here with him today as well.  He understands fully why he is having this type of injection.  His knee on exam today shows no effusion.  There is global tenderness of the left knee.   I did place Durolane in the left knee today without difficulty.  We can certainly see him back in 2 months to see if this is helped.  He has been dealing with right knee pain as well."   Now recently had some left hip pain with knee pain.  His left knee xray read below.  2 views of the right knee show tricompartmental arthritis with mainly  medial joint space narrowing, patellofemoral narrowing and varus  malalignment.  Pain is keeping him up at night. Can't sleep.  Pt does have celebrex available to use if needed.  Pt bp is elevated on first check. Bp better on recheck. Pt is on losartan 50 mg daily.   Due to knee pain pt also request handicap placard for car.  Patient mentions that in the past I did fill form but then he lost form.    Review of Systems  Constitutional:  Negative for chills, fatigue and fever.  HENT:  Negative for dental problem.   Respiratory:  Negative for cough, chest tightness, shortness of breath and wheezing.   Cardiovascular:  Negative for chest pain and palpitations.  Gastrointestinal:  Negative for abdominal pain.  Musculoskeletal:        See hpi.  Neurological:  Negative for dizziness, speech  difficulty, weakness, numbness and headaches.    Past Medical History:  Diagnosis Date   Blood transfusion without reported diagnosis    Diabetes mellitus without complication (HCC)    type II   History of ETOH abuse    Hypertension      Social History   Socioeconomic History   Marital status: Married    Spouse name: Not on file   Number of children: Not on file   Years of education: Not on file   Highest education level: Not on file  Occupational History   Not on file  Tobacco Use   Smoking status: Former   Smokeless tobacco: Former    Quit date: 11/08/1993  Vaping Use   Vaping Use: Never used  Substance and Sexual Activity   Alcohol use: No    Alcohol/week: 0.0 standard drinks    Comment: occasional   Drug use: No   Sexual activity: Not on file  Other Topics Concern   Not on file  Social History Narrative   Not on file   Social Determinants of Health   Financial Resource Strain: Not on file  Food Insecurity: Not on file  Transportation Needs: Not on file  Physical Activity: Not on file  Stress: Not on file  Social Connections: Not on file  Intimate Partner Violence: Not on file  Past Surgical History:  Procedure Laterality Date   BACK SURGERY N/A 2010    Family History  Problem Relation Age of Onset   Diabetes Unknown    Hypertension Unknown    Arthritis Father    Diabetes Father     No Known Allergies  Current Outpatient Medications on File Prior to Visit  Medication Sig Dispense Refill   aspirin EC 81 MG tablet Take 81 mg by mouth at bedtime.     atorvastatin (LIPITOR) 40 MG tablet TAKE 1 TABLET BY MOUTH DAILY THEN TAKE 1/2 TABLET BY MOUTH AT BEDTIME 135 tablet 0   B-D UF III MINI PEN NEEDLES 31G X 5 MM MISC USE FIVE PER DAY AS DIRECTED 200 each 0   celecoxib (CELEBREX) 100 MG capsule Take 1 capsule (100 mg total) by mouth 2 (two) times daily. 14 capsule 0   diazepam (VALIUM) 5 MG tablet Take 1 tablet (5 mg total) by mouth every 6 (six) hours  as needed for muscle spasms. (Patient not taking: Reported on 03/19/2021) 40 tablet 0   fluconazole (DIFLUCAN) 150 MG tablet 1 tab po q day x 3 days (Patient not taking: Reported on 03/19/2021) 3 tablet 0   gabapentin (NEURONTIN) 100 MG capsule TAKE 1 CAPSULE(100 MG) BY MOUTH THREE TIMES DAILY (Patient not taking: Reported on 03/19/2021) 90 capsule 3   gabapentin (NEURONTIN) 300 MG capsule Take 300 mg by mouth 2 (two) times daily. (Patient not taking: Reported on 03/19/2021)     glucose blood (FREESTYLE LITE) test strip USE AS INSTRUCTED FOUR TIMES DAILY (Patient not taking: Reported on 03/19/2021) 150 strip 2   Insulin Disposable Pump (OMNIPOD 5 PACK) MISC CHANGE EVERY 48 HOURS AS DIRECTED 45 each 2   insulin lispro (HUMALOG) 100 UNIT/ML injection INJECT UP TO 90 UNITS UNDER THE SKIN DAILY VIA INSULIN PUMP 90 mL 3   Insulin Pen Needle 32G X 4 MM MISC Use 5 pens per day to inject insulin 200 each 3   Insulin Syringe-Needle U-100 (INSULIN SYRINGE .5CC/30GX5/16") 30G X 5/16" 0.5 ML MISC Use 3 times a day for insulin 100 each 1   losartan (COZAAR) 50 MG tablet TAKE 1 TABLET(50 MG) BY MOUTH DAILY 90 tablet 1   meloxicam (MOBIC) 15 MG tablet Take 1 tablet (15 mg total) by mouth daily. (Patient not taking: Reported on 03/19/2021) 30 tablet 1   NON FORMULARY Take 2 capsules by mouth 2 (two) times daily. 4LIFE TRANSFER FACTORY CARDIO     nystatin cream (MYCOSTATIN) Apply 1 application topically 2 (two) times daily. (Patient not taking: Reported on 03/19/2021) 30 g 0   omeprazole (PRILOSEC) 20 MG capsule Take 1 capsule (20 mg total) by mouth daily. (Patient not taking: Reported on 03/19/2021) 30 capsule 3   OVER THE COUNTER MEDICATION Take 2 tablets by mouth 2 (two) times daily. 4LIFE TRANSFER FACTORY RECALL (Patient not taking: Reported on 03/19/2021)     SYNJARDY XR 03-999 MG TB24 TAKE 2 TABLETS BY MOUTH DAILY 60 tablet 2   TRULICITY 1.5 MG/0.5ML SOPN ADMINISTER 1.5 MG UNDER THE SKIN 1 TIME WEEKLY AS DIRECTED 2 mL 2    No current facility-administered medications on file prior to visit.    BP 136/78   Pulse (!) 55   Temp 98.3 F (36.8 C)   Resp 18   Ht 5\' 3"  (1.6 m)   Wt 204 lb (92.5 kg)   SpO2 96%   BMI 36.14 kg/m       Objective:  Physical Exam  General- No acute distress. Pleasant patient. Neck- Full range of motion, no jvd Lungs- Clear, even and unlabored. Heart- regular rate and rhythm. Neurologic- CNII- XII grossly intact.  Left knee- mild crepitus today on flexion and extension. Pain reported  Left hip- pain palpation. Mild pain on rom.        Assessment & Plan:   History of chronic left knee pain and does see orthopedics regularly for this.  May need knee replacement in the near future.  Pain recently enough to keep you up at night.  Recommend using Celebrex.  Rx advisement given regarding Celebrex.  Also making tramadol No. 12 tablets available.  Limit use as discussed.  If possible just limited to nighttime use since having difficulty sleeping due to high level of pain.  Follow-up with orthopedist in August.  Recent left hip pain associated with left knee pain.  We will get x-ray of hip today to assess joint space.  History of hypertension.  Initially blood pressure was high but on recheck much better.  Continue losartan 50 mg daily.  Will fill out handicap placard form if you or staff could get me on exam.  Follow-up in 3 months or sooner if needed.  Esperanza Richters, PA-C

## 2021-06-03 NOTE — Patient Instructions (Addendum)
History of chronic left knee pain and does see orthopedics regularly for this.  May need knee replacement in the near future.  Pain recently enough to keep you up at night.  Recommend using Celebrex.  Rx advisement given regarding Celebrex.  Also making tramadol No. 12 tablets available.  Limit use as discussed.  If possible just limited to nighttime use since having difficulty sleeping due to high level of pain.  Follow-up with orthopedist in August.  Recent left hip pain associated with left knee pain.  We will get x-ray of hip today to assess joint space.  History of hypertension.  Initially blood pressure was high but on recheck much better.  Continue losartan 50 mg daily.  Will fill out handicap placard form if you or staff could get me on exam.  Follow-up in 3 months or sooner if needed.

## 2021-06-15 ENCOUNTER — Ambulatory Visit (INDEPENDENT_AMBULATORY_CARE_PROVIDER_SITE_OTHER): Payer: Medicare Other | Admitting: Orthopaedic Surgery

## 2021-06-15 ENCOUNTER — Encounter: Payer: Self-pay | Admitting: Orthopaedic Surgery

## 2021-06-15 DIAGNOSIS — M25562 Pain in left knee: Secondary | ICD-10-CM | POA: Diagnosis not present

## 2021-06-15 DIAGNOSIS — G8929 Other chronic pain: Secondary | ICD-10-CM | POA: Diagnosis not present

## 2021-06-15 DIAGNOSIS — M1712 Unilateral primary osteoarthritis, left knee: Secondary | ICD-10-CM

## 2021-06-15 NOTE — Progress Notes (Signed)
Office Visit Note   Patient: Samuel [name]           Date of Birth: Aug 23, 1951           MRN: [id number] Visit Date: 06/15/2021              Requested by: Esperanza Richters, PA-C [id number] [place],  [place] [address] PCP: Esperanza Richters, PA-C   Assessment & Plan: Visit Diagnoses:  1. Chronic pain of left knee   2. Unilateral primary osteoarthritis, left knee     Plan: At this point he does wish to proceed with a total knee arthroplasty for his left knee and I agree with this as well given the x-ray findings combined with the failure of conservative treatment for over 12 months.  I went over knee replacement surgery with him in detail showing him a knee model and explaining how the surgery is performed.  I discussed the risks and benefits of surgery and what to expect with an intraoperative and postoperative course.  We will work on getting this scheduled in the near future.  All questions and concerns were answered and addressed.  Follow-Up Instructions: Return for 2 weeks post-op.   Orders:  No orders of the defined types were placed in this encounter.  No orders of the defined types were placed in this encounter.     Procedures: No procedures performed   Clinical Data: No additional findings.   Subjective: Chief Complaint  Patient presents with   Left Knee - Follow-up  The patient is very well-known to me.  He is a non-English-speaking 70 year old gentleman with known tricompartment arthritis of his left knee.  He does have right knee arthritis but his left knee is the one that hurts him the most.  At this point his pain is daily with his left knee and it is 10 out of 10.  It is detrimentally affecting his mobility, his quality of life and his activities daily living.  We have seen him for a long period time for this knee.  Is been hurting for over a year.  He has tried and failed activity modification, anti-inflammatories, weight loss, steroid injections  and hyaluronic acid injections for his left knee.  At this point he wants more of a permanent solution for his left knee.  He is a diabetic and his last hemoglobin A1c was 7.1.  He has an interpreter with him today.  HPI  Review of Systems There is currently listed no headache, chest pain, shortness of breath, fever, chills, nausea, vomiting  Objective: Vital Signs: There were no vitals taken for this visit.  Physical Exam He is alert and oriented x3 and in no acute distress Ortho Exam Examination of his left knee shows mild varus malalignment that is correctable.  There is patellofemoral crepitation and patellofemoral pain.  There is also medial lateral joint line tenderness and pain throughout the arc of motion of the left knee.  His left knee is ligamentously stable. Specialty Comments:  No specialty comments available.  Imaging: No results found. Previous x-rays of the left knee show tricompartmental arthritis with varus malalignment, joint space narrowing and periarticular osteophytes.  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) 05/01/2015   Cough 01/02/2015   Diabetes (HCC) 10/02/2014   Essential hypertension, benign 06/17/2014   Type II or unspecified type diabetes mellitus without mention of complication, not stated as uncontrolled 06/17/2014   Hyperlipidemia 06/17/2014   Hip pain 06/17/2014   Back pain 06/17/2014   Frequent urination 06/17/2014   Unspecified asthma(493.90) 06/17/2014   Asthma 06/17/2014   Diabetes mellitus type 2, uncontrolled (HCC) 01/26/2012   Past Medical History:  Diagnosis Date   Blood transfusion without reported diagnosis     Diabetes mellitus without complication (HCC)    type II   History of ETOH abuse    Hypertension     Family History  Problem Relation Age of Onset   Diabetes Unknown    Hypertension Unknown    Arthritis Father    Diabetes Father     Past Surgical History:  Procedure Laterality Date   BACK SURGERY N/A 2010   Social History   Occupational History   Not on file  Tobacco Use   Smoking status: Former   Smokeless tobacco: Former    Quit date: 11/08/1993  Vaping Use   Vaping Use: Never used  Substance and Sexual Activity   Alcohol use: No    Alcohol/week: 0.0 standard drinks    Comment: occasional   Drug use: No   Sexual activity: Not on file

## 2021-06-16 ENCOUNTER — Other Ambulatory Visit: Payer: Self-pay | Admitting: Endocrinology

## 2021-06-22 ENCOUNTER — Other Ambulatory Visit: Payer: Self-pay

## 2021-06-22 ENCOUNTER — Other Ambulatory Visit (INDEPENDENT_AMBULATORY_CARE_PROVIDER_SITE_OTHER): Payer: Medicare Other

## 2021-06-22 DIAGNOSIS — E1165 Type 2 diabetes mellitus with hyperglycemia: Secondary | ICD-10-CM | POA: Diagnosis not present

## 2021-06-22 DIAGNOSIS — Z794 Long term (current) use of insulin: Secondary | ICD-10-CM

## 2021-06-22 LAB — BASIC METABOLIC PANEL
BUN: 17 mg/dL (ref 6–23)
CO2: 30 mEq/L (ref 19–32)
Calcium: 9.5 mg/dL (ref 8.4–10.5)
Chloride: 105 mEq/L (ref 96–112)
Creatinine, Ser: 0.82 mg/dL (ref 0.40–1.50)
GFR: 89.33 mL/min (ref >60.00)
Glucose, Bld: 89 mg/dL (ref 70–99)
Potassium: 4.1 mEq/L (ref 3.5–5.1)
Sodium: 143 mEq/L (ref 135–145)

## 2021-06-22 LAB — HEMOGLOBIN A1C: Hgb A1c MFr Bld: 7.3 % — ABNORMAL HIGH (ref 4.6–6.5)

## 2021-06-25 ENCOUNTER — Encounter: Payer: Self-pay | Admitting: Endocrinology

## 2021-06-25 ENCOUNTER — Telehealth: Payer: Self-pay | Admitting: Orthopaedic Surgery

## 2021-06-25 ENCOUNTER — Other Ambulatory Visit: Payer: Self-pay

## 2021-06-25 ENCOUNTER — Ambulatory Visit (INDEPENDENT_AMBULATORY_CARE_PROVIDER_SITE_OTHER): Payer: Medicare Other | Admitting: Endocrinology

## 2021-06-25 VITALS — BP 154/73 | HR 66 | Ht 63.0 in | Wt 205.2 lb

## 2021-06-25 DIAGNOSIS — E1165 Type 2 diabetes mellitus with hyperglycemia: Secondary | ICD-10-CM

## 2021-06-25 DIAGNOSIS — Z794 Long term (current) use of insulin: Secondary | ICD-10-CM | POA: Diagnosis not present

## 2021-06-25 DIAGNOSIS — I10 Essential (primary) hypertension: Secondary | ICD-10-CM | POA: Diagnosis not present

## 2021-06-25 NOTE — Telephone Encounter (Signed)
I called and left voice mail for return call. 

## 2021-06-25 NOTE — Progress Notes (Signed)
Patient ID: AMER ALCINDOR, male   DOB: 1950-12-30, 70 y.o.   MRN: [id number]            Reason for Appointment:  Followup for Type 2 Diabetes  Referring physician: Saguier  History of Present Illness:          Diagnosis: Type 2 diabetes mellitus, date of diagnosis:  1990      Past history: He thinks he has been on insulin for the last 10-12 years. Previously had been on metformin which has been continued. He was probably tried on different insulin regimens initially but has been taking 70/30 mostly because of cost Previous records are not available and appears that his sugars have been poorly controlled for several years He was  referred here because of an A1c in 8/15 of 9.7%. He was on a regimen of Novolin 70/30 twice a day but because of inadequate control and higher readings later in the day he was switched to basal bolus insulin regimen in 12/15. Previous insulin regimen: Humalog  ac meals 35-40-30 Toujeo 35-40 units daily  Recent history:   INSULIN regimen is: OMNIPOD INSULIN PUMP  Basal rate settings: Midnight = 1.3, 6am: 1.65, 5 PM = 1.6.  Boluses 12 units breakfast -14 units lunch and 18 units at dinner at meals Total daily insulin up to 80 units  Carbohydrate coverage 1:10 and correction 1:50 Usual mealtime boluses: 12 units at breakfast, 14 at lunch and 18 at dinner   Non-insulin hypoglycemic drugs: Metformin 1 g twice a day, Trulicity 1.5 mg weekly, Synjardy XR 03/999, 2 tablets daily  A1c is now 7.3 and gradually decreasing  Current basal insulin about 36 units and bolus 20 units/day  Current management, blood sugar patterns and problems identified: He has not restarted using the freestyle libre blood he had used previously, did not get a refill for unknown reasons  However he was told recently that he is being mailed a supply from [facility] He also thinks that the freestyle Josephine Igo would not last for 14 days or he was told that he was not due for refills when needed   History is somewhat unclear Recently most of his high blood sugars appear to be from forgetting to bolus before eating  HIGHEST blood sugars appear to be in the afternoons and early evenings likely from missed boluses or late boluses at lunchtime  He does have sporadic hypoglycemia also  Today he got hypoglycemia because he a total of 27 units in the morning with a about 3 hours; earlier in the morning he had just toast and coffee and then he had a full breakfast  Has another low blood sugar around midnight probably from a late bolus He is limited with his walking ability because of knee pain and tries to walk as tolerated He has not forgotten any doses of Trulicity His weight is about the same  FASTING blood sugars are generally fairly well controlled and no overnight hypoglycemia  His mealtimes are generally variable, evening meal 5-7 PM       Side effects from medications have been: ?  Nausea/dizziness on Victoza    PRE-MEAL Fasting Lunch Dinner Bedtime Overall  Glucose range:     50-284  Mean/median: 143 183 193  161   POST-MEAL PC Breakfast PC Lunch PC Dinner  Glucose range:     Mean/median:   171   Previously:   PRE-MEAL Fasting  midday  early evening Bedtime Overall  Glucose range:  108-162  112-317  322-025  49-208   Averages:     165      Dietician visit, most recent: 5/16 Last CDE visit: 12/2016            Weight history: Previous range 200-220  Wt Readings from Last 3 Encounters:  06/25/21 205 lb 3.2 oz (93.1 kg)  06/03/21 204 lb (92.5 kg)  03/19/21 205 lb (93 kg)    Glycemic control:   Lab Results  Component Value Date   HGBA1C 7.3 (H) 06/22/2021   HGBA1C 7.1 (A) 03/19/2021   HGBA1C 6.8 (A) 12/16/2020   Lab Results  Component Value Date   MICROALBUR <0.7 12/16/2020   LDLCALC 37 12/16/2020   CREATININE 0.82 06/22/2021    Lab Results  Component Value Date   FRUCTOSAMINE 302 (H) 07/20/2017       Allergies as of 06/25/2021   No Known  Allergies      Medication List        Accurate as of June 25, 2021  8:34 PM. If you have any questions, ask your nurse or doctor.          aspirin EC 81 MG tablet Take 81 mg by mouth at bedtime.   atorvastatin 40 MG tablet Commonly known as: LIPITOR TAKE 1 TABLET BY MOUTH DAILY THEN TAKE 1/2 TABLET BY MOUTH AT BEDTIME   celecoxib 100 MG capsule Commonly known as: CeleBREX Take 1 capsule (100 mg total) by mouth 2 (two) times daily.   diazepam 5 MG tablet Commonly known as: Valium Take 1 tablet (5 mg total) by mouth every 6 (six) hours as needed for muscle spasms.   fluconazole 150 MG tablet Commonly known as: DIFLUCAN 1 tab po q day x 3 days   FREESTYLE LITE test strip Generic drug: glucose blood USE AS INSTRUCTED FOUR TIMES DAILY   gabapentin 300 MG capsule Commonly known as: NEURONTIN Take 300 mg by mouth 2 (two) times daily.   gabapentin 100 MG capsule Commonly known as: NEURONTIN TAKE 1 CAPSULE(100 MG) BY MOUTH THREE TIMES DAILY   insulin lispro 100 UNIT/ML injection Commonly known as: HUMALOG INJECT UP TO 90 UNITS UNDER THE SKIN DAILY VIA INSULIN PUMP   Insulin Pen Needle 32G X 4 MM Misc Use 5 pens per day to inject insulin   B-D UF III MINI PEN NEEDLES 31G X 5 MM Misc Generic drug: Insulin Pen Needle USE FIVE PER DAY AS DIRECTED   INSULIN SYRINGE .5CC/30GX5/16" 30G X 5/16" 0.5 ML Misc Use 3 times a day for insulin   losartan 50 MG tablet Commonly known as: COZAAR TAKE 1 TABLET(50 MG) BY MOUTH DAILY   meloxicam 15 MG tablet Commonly known as: MOBIC Take 1 tablet (15 mg total) by mouth daily.   NON FORMULARY Take 2 capsules by mouth 2 (two) times daily. 4LIFE TRANSFER FACTORY CARDIO   nystatin cream Commonly known as: MYCOSTATIN Apply 1 application topically 2 (two) times daily.   omeprazole 20 MG capsule Commonly known as: PRILOSEC Take 1 capsule (20 mg total) by mouth daily.   Omnipod Classic Pods (Gen 3) Misc CHANGE EVERY 48 HOURS  AS DIRECTED   OVER THE COUNTER MEDICATION Take 2 tablets by mouth 2 (two) times daily. 4LIFE TRANSFER FACTORY RECALL   Synjardy XR 03-999 MG Tb24 Generic drug: Empagliflozin-metFORMIN HCl ER TAKE 2 TABLETS BY MOUTH DAILY   Trulicity 1.5 MG/0.5ML Sopn Generic drug: Dulaglutide ADMINISTER 1.5 MG UNDER THE SKIN 1 TIME WEEKLY AS DIRECTED  Allergies: No Known Allergies  Past Medical History:  Diagnosis Date   Blood transfusion without reported diagnosis    Diabetes mellitus without complication (HCC)    type II   History of ETOH abuse    Hypertension     Past Surgical History:  Procedure Laterality Date   BACK SURGERY N/A 2010    Family History  Problem Relation Age of Onset   Diabetes Unknown    Hypertension Unknown    Arthritis Father    Diabetes Father     Social History:  reports that he has quit smoking. He quit smokeless tobacco use about 27 years ago. He reports that he does not drink alcohol and does not use drugs.    Review of Systems         Lipids:  He has been on Lipitor 40 mg from pcp for hypercholesterolemia since about 2011, lipid levels as below         Lab Results  Component Value Date   CHOL 96 12/16/2020   HDL 42.40 12/16/2020   LDLCALC 37 12/16/2020   LDLDIRECT 44.0 01/24/2020   TRIG 82.0 12/16/2020   CHOLHDL 2 12/16/2020    Hypertension: Has been treated with losartan 50 mg, prescribed by PCP Also on Jardiance Blood pressure be higher on first measurement  BP Readings from Last 3 Encounters:  06/25/21 (!) 154/73  06/03/21 136/78  03/19/21 (!) 148/72    Microalbumin normal as of 3/21   Physical Examination:  BP (!) 154/73   Pulse 66   Ht 5\' 3"  (1.6 m)   Wt 205 lb 3.2 oz (93.1 kg)   SpO2 93%   BMI 36.35 kg/m     ASSESSMENT:  Diabetes type 2, with obesity and BMI of around 36  See history of present illness for  description of current diabetes management, blood sugar patterns and problems identified..  His  diabetes is being managed with the Omnipod insulin pump, Synjardy and Trulicity 1.5 mg  His A1c is 7.3  Although his blood sugars are on an average about 160 he has several readings over 200  This is likely to be from late, and adequate or missed boluses  He tends to forget to bolus despite instructions   Generally appears not to be creating excessive carbohydrates or high fat meals Since fasting readings are fairly good and some Premeal readings are quite normal will not need to change his basal rate He also likely does better when he is on a continuous sensor instead of fingersticks and is reportedly waiting for shipment again   HYPERTENSION: His blood pressure is relatively higher He will continue to work with the PCP, does appear to have some whitecoat syndrome  May also benefit from home monitoring    PLAN:  He will be referred for education for pump management with nurse educator  He will likely need to have a programmed reminder for boluses so that he does not miss these Continue same bolus settings He needs to try remember to bolus when he starts eating and reminded him that he has high readings because he does not cover his food with bolus insulin Also again reminded him not to bolus late at night to avoid hypoglycemia overnight He will need to make sure he has some protein at each meal and discussed types of protein available  He will be able to have his upcoming knee surgery next month and with this he may be able to increase his walking subsequently Continue  Trulicity 3 mg  Follow-up in 3 months  Total visit time including counseling = 30 minutes  Patient Instructions  Bolus at the time of meal and not over 15 min after the meal  Have protein at each meal  Need reminder on pump for late or missed boluses   Reather Littler 06/25/2021, 8:34 PM   Note: This office note was prepared with Dragon voice recognition system technology. Any transcriptional errors that result  from this process are unintentional. May

## 2021-06-25 NOTE — Telephone Encounter (Signed)
Pt wanting to know when he will be available to get sch for surg. Pt spoke with Dr. And he stated no one has called him to sch. Pt wanted to know if there was something on his end he needed to do to get the surg sch started. Advised pt that surg sch can sometimes take time to complete but will send the message back. The best call back number is [phone].

## 2021-06-25 NOTE — Patient Instructions (Signed)
Bolus at the time of meal and not over 15 min after the meal  Have protein at each meal  Need reminder on pump for late or missed boluses

## 2021-07-01 NOTE — Telephone Encounter (Signed)
Received e-mail from daughter Robert Bellow requesting that I communicate with her regarding scheduling surgery.  I called and left her a voice mail for a return call. [phone]

## 2021-07-08 NOTE — Telephone Encounter (Signed)
I called patient daughter and left voice mail for return call.

## 2021-07-10 NOTE — Telephone Encounter (Signed)
Spoke with patient and daughter and scheduled surgery.

## 2021-07-20 ENCOUNTER — Other Ambulatory Visit: Payer: Self-pay

## 2021-07-22 ENCOUNTER — Encounter: Payer: Medicare Other | Attending: Endocrinology | Admitting: Nutrition

## 2021-07-22 ENCOUNTER — Ambulatory Visit: Payer: Medicare Other | Admitting: Orthopaedic Surgery

## 2021-07-23 ENCOUNTER — Other Ambulatory Visit: Payer: Self-pay | Admitting: Physician Assistant

## 2021-07-24 NOTE — Patient Instructions (Addendum)
DUE TO COVID-19 ONLY ONE VISITOR IS ALLOWED TO COME WITH YOU AND STAY IN THE WAITING ROOM ONLY DURING PRE OP AND PROCEDURE.   **NO VISITORS ARE ALLOWED IN THE SHORT STAY AREA OR RECOVERY ROOM!!**  IF YOU WILL BE ADMITTED INTO THE HOSPITAL YOU ARE ALLOWED ONLY TWO SUPPORT PEOPLE DURING VISITATION HOURS ONLY (10AM -8PM)   The support person(s) may change daily. The support person(s) must pass our screening, gel in and out, and wear a mask at all times, including in the patient's room. Patients must also wear a mask when staff or their support person are in the room.  No visitors under the age of 76. Any visitor under the age of 49 must be accompanied by an adult.    COVID SWAB TESTING MUST BE COMPLETED ON:  08/05/21 **MUST PRESENT COMPLETED FORM AT TESTING SITE**    [address]  [place] (backside of the building) Open 8am-3pm. No appointment needed. You are not required to quarantine, however you are required to wear a well-fitted mask when you are out and around people not in your household.  Hand Hygiene often Do NOT share personal items Notify your provider if you are in close contact with someone who has COVID or you develop fever 100.4 or greater, new onset of sneezing, cough, sore throat, shortness of breath or body aches.   Your procedure is scheduled on: 08/07/21   Report to [facility] Main  Entrance    Report to admitting at 8:15 AM   Call this number if you have problems the morning of surgery [phone]   Do not eat food :After Midnight.   May have liquids until 8:00 AM day of surgery  CLEAR LIQUID DIET  Foods Allowed                                                                     Foods Excluded  Water, Black Coffee and tea (no milk or creamer)            liquids that you cannot  Plain Jell-O in any flavor  (No red)                                     see through such as: Fruit ices (not with fruit pulp)                                              milk, soups, orange juice              Iced Popsicles (No red)                                                  All solid food  Apple juices             Sports drinks like Gatorade (No red) Lightly seasoned clear broth or consume(fat free) Sugar     The day of surgery:  Drink ONE (1) Pre-Surgery G2 by 8:00 am the morning of surgery. Drink in one sitting. Do not sip.  This drink was given to you during your hospital  pre-op appointment visit. Nothing else to drink after completing the  Pre-Surgery G2.          If you have questions, please contact your surgeon's office.     Oral Hygiene is also important to reduce your risk of infection.                                    Remember - BRUSH YOUR TEETH THE MORNING OF SURGERY WITH YOUR REGULAR TOOTHPASTE   Take these medicines the morning of surgery with A SIP OF WATER: Gabapentin  DO NOT TAKE ANY ORAL DIABETIC MEDICATIONS DAY OF YOUR SURGERY  How to Manage Your Diabetes Before and After Surgery  Why is it important to control my blood sugar before and after surgery? Improving blood sugar levels before and after surgery helps healing and can limit problems. A way of improving blood sugar control is eating a healthy diet by:  Eating less sugar and carbohydrates  Increasing activity/exercise  Talking with your doctor about reaching your blood sugar goals High blood sugars (greater than 180 mg/dL) can raise your risk of infections and slow your recovery, so you will need to focus on controlling your diabetes during the weeks before surgery. Make sure that the doctor who takes care of your diabetes knows about your planned surgery including the date and location.  How do I manage my blood sugar before surgery? Check your blood sugar at least 4 times a day, starting 2 days before surgery, to make sure that the level is not too high or low. Check your blood sugar the morning of your surgery when you  wake up and every 2 hours until you get to the Short Stay unit. If your blood sugar is less than 70 mg/dL, you will need to treat for low blood sugar: Do not take insulin. Treat a low blood sugar (less than 70 mg/dL) with  cup of clear juice (cranberry or apple), 4 glucose tablets, OR glucose gel. Recheck blood sugar in 15 minutes after treatment (to make sure it is greater than 70 mg/dL). If your blood sugar is not greater than 70 mg/dL on recheck, call [phone] for further instructions. Report your blood sugar to the short stay nurse when you get to Short Stay.  If you are admitted to the hospital after surgery: Your blood sugar will be checked by the staff and you will probably be given insulin after surgery (instead of oral diabetes medicines) to make sure you have good blood sugar levels. The goal for blood sugar control after surgery is 80-180 mg/dL.   WHAT DO I DO ABOUT MY DIABETES MEDICATION?  Do not take oral diabetes medicines (pills) the morning of surgery.  THE DAY BEFORE SURGERY, do not take Synjardy.      THE MORNING OF SURGERY, do not take Synjardy.  The day of surgery, do not take other diabetes injectables, including Byetta (exenatide), Bydureon (exenatide ER), Victoza (liraglutide), or Trulicity (dulaglutide).  If your CBG is greater than 220 mg/dL, you may  take  of your sliding scale  (correction) dose of insulin.    For patients with insulin pumps: Contact your diabetes doctor for specific instructions before surgery. Decrease basal rates by 20% at midnight the night before your surgery. Note that if your surgery is planned to be longer than 2 hours, your insulin pump will be removed and intravenous (IV) insulin will be started and managed by the nurses and the anesthesiologist. You will be able to restart your insulin pump once you are awake and able to manage it.  Make sure to bring insulin pump supplies to the hospital with you in case the  site needs to be  changed.   Reviewed and Endorsed by [facility] Patient Education Committee, August 2015                               You may not have any metal on your body including jewelry, and body piercing             Do not wear lotions, powders, cologne, or deodorant              Men may shave face and neck.   Do not bring valuables to the hospital. [facility] IS NOT             RESPONSIBLE   FOR VALUABLES.   Bring small overnight bag day of surgery.   Please read over the following fact sheets you were given: IF YOU HAVE QUESTIONS ABOUT YOUR PRE OP INSTRUCTIONS PLEASE CALL [phone]- [facility] - Preparing for Surgery Before surgery, you can play an important role.  Because skin is not sterile, your skin needs to be as free of germs as possible.  You can reduce the number of germs on your skin by washing with CHG (chlorahexidine gluconate) soap before surgery.  CHG is an antiseptic cleaner which kills germs and bonds with the skin to continue killing germs even after washing. Please DO NOT use if you have an allergy to CHG or antibacterial soaps.  If your skin becomes reddened/irritated stop using the CHG and inform your nurse when you arrive at Short Stay. Do not shave (including legs and underarms) for at least 48 hours prior to the first CHG shower.  You may shave your face/neck.  Please follow these instructions carefully:  1.  Shower with CHG Soap the night before surgery and the  morning of surgery.  2.  If you choose to wash your hair, wash your hair first as usual with your normal  shampoo.  3.  After you shampoo, rinse your hair and body thoroughly to remove the shampoo.                             4.  Use CHG as you would any other liquid soap.  You can apply chg directly to the skin and wash.  Gently with a scrungie or clean washcloth.  5.  Apply the CHG Soap to your body ONLY FROM THE NECK DOWN.   Do   not use on face/ open                           Wound or open sores.  Avoid contact with eyes, ears mouth and   genitals (private parts).  Wash face,  Genitals (private parts) with your normal soap.             6.  Wash thoroughly, paying special attention to the area where your    surgery  will be performed.  7.  Thoroughly rinse your body with warm water from the neck down.  8.  DO NOT shower/wash with your normal soap after using and rinsing off the CHG Soap.                9.  Pat yourself dry with a clean towel.            10.  Wear clean pajamas.            11.  Place clean sheets on your bed the night of your first shower and do not  sleep with pets. Day of Surgery : Do not apply any lotions/deodorants the morning of surgery.  Please wear clean clothes to the hospital/surgery center.  FAILURE TO FOLLOW THESE INSTRUCTIONS MAY RESULT IN THE CANCELLATION OF YOUR SURGERY  PATIENT SIGNATURE_________________________________  NURSE SIGNATURE__________________________________  ________________________________________________________________________   Rogelia Mire  An incentive spirometer is a tool that can help keep your lungs clear and active. This tool measures how well you are filling your lungs with each breath. Taking long deep breaths may help reverse or decrease the chance of developing breathing (pulmonary) problems (especially infection) following: A long period of time when you are unable to move or be active. BEFORE THE PROCEDURE  If the spirometer includes an indicator to show your best effort, your nurse or respiratory therapist will set it to a desired goal. If possible, sit up straight or lean slightly forward. Try not to slouch. Hold the incentive spirometer in an upright position. INSTRUCTIONS FOR USE  Sit on the edge of your bed if possible, or sit up as far as you can in bed or on a chair. Hold the incentive spirometer in an upright position. Breathe out normally. Place the mouthpiece in your mouth and seal  your lips tightly around it. Breathe in slowly and as deeply as possible, raising the piston or the ball toward the top of the column. Hold your breath for 3-5 seconds or for as long as possible. Allow the piston or ball to fall to the bottom of the column. Remove the mouthpiece from your mouth and breathe out normally. Rest for a few seconds and repeat Steps 1 through 7 at least 10 times every 1-2 hours when you are awake. Take your time and take a few normal breaths between deep breaths. The spirometer may include an indicator to show your best effort. Use the indicator as a goal to work toward during each repetition. After each set of 10 deep breaths, practice coughing to be sure your lungs are clear. If you have an incision (the cut made at the time of surgery), support your incision when coughing by placing a pillow or rolled up towels firmly against it. Once you are able to get out of bed, walk around indoors and cough well. You may stop using the incentive spirometer when instructed by your caregiver.  RISKS AND COMPLICATIONS Take your time so you do not get dizzy or light-headed. If you are in pain, you may need to take or ask for pain medication before doing incentive spirometry. It is harder to take a deep breath if you are having pain. AFTER USE Rest and breathe slowly and easily. It can be helpful to  keep track of a log of your progress. Your caregiver can provide you with a simple table to help with this. If you are using the spirometer at home, follow these instructions: SEEK MEDICAL CARE IF:  You are having difficultly using the spirometer. You have trouble using the spirometer as often as instructed. Your pain medication is not giving enough relief while using the spirometer. You develop fever of 100.5 F (38.1 C) or higher. SEEK IMMEDIATE MEDICAL CARE IF:  You cough up bloody sputum that had not been present before. You develop fever of 102 F (38.9 C) or greater. You  develop worsening pain at or near the incision site. MAKE SURE YOU:  Understand these instructions. Will watch your condition. Will get help right away if you are not doing well or get worse. Document Released: 03/07/2007 Document Revised: 01/17/2012 Document Reviewed: 05/08/2007 [facility] Patient Information 2014 [place], [place].   ________________________________________________________________________

## 2021-07-24 NOTE — Progress Notes (Addendum)
COVID swab appointment: 08/05/21  COVID Vaccine Completed: yes x3 Date COVID Vaccine completed: 01/03/20, 01/31/20 Has received booster: 09/20/20 COVID vaccine manufacturer: Pfizer       Date of COVID positive in last 90 days: No  PCP - Esperanza Richters, PA Cardiologist - N/a  Chest x-ray - N/a EKG - 07/28/21 Epic Stress Test - N/a ECHO - N/a Cardiac Cath - N/a Pacemaker/ICD device last checked: N/a Spinal Cord Stimulator: N/a  Sleep Study - N/a CPAP -   Fasting Blood Sugar - 115-160 Checks Blood Sugar _10_ times a day Insulin pump omnipod Continuous glucose monitor, freestyle libre  Blood Thinner Instructions: N/a Aspirin Instructions: Last Dose:  Activity level: Can go up a flight of stairs and perform activities of daily living without stopping and without symptoms of chest pain or shortness of breath.       Anesthesia review: HTN, DM,   Patient denies shortness of breath, fever, cough and chest pain at PAT appointment   Patient verbalized understanding of instructions that were given to them at the PAT appointment. Patient was also instructed that they will need to review over the PAT instructions again at home before surgery.

## 2021-07-28 ENCOUNTER — Other Ambulatory Visit: Payer: Self-pay

## 2021-07-28 ENCOUNTER — Encounter (HOSPITAL_COMMUNITY)
Admission: RE | Admit: 2021-07-28 | Discharge: 2021-07-28 | Disposition: A | Discharge status: Home / Self Care | Payer: Medicare Other | Source: Ambulatory Visit | Attending: Orthopaedic Surgery | Admitting: Orthopaedic Surgery

## 2021-07-28 ENCOUNTER — Encounter (HOSPITAL_COMMUNITY): Payer: Self-pay

## 2021-07-28 DIAGNOSIS — Z01818 Encounter for other preprocedural examination: Secondary | ICD-10-CM | POA: Diagnosis not present

## 2021-07-28 DIAGNOSIS — R7309 Other abnormal glucose: Secondary | ICD-10-CM | POA: Insufficient documentation

## 2021-07-28 LAB — CBC
HCT: 46.3 % (ref 39.0–52.0)
Hemoglobin: 16 g/dL (ref 13.0–17.0)
MCH: 30.5 pg (ref 26.0–34.0)
MCHC: 34.6 g/dL (ref 30.0–36.0)
MCV: 88.4 fL (ref 80.0–100.0)
Platelets: 197 10*3/uL (ref 150–400)
RBC: 5.24 MIL/uL (ref 4.22–5.81)
RDW: 13.2 % (ref 11.5–15.5)
WBC: 6.3 10*3/uL (ref 4.0–10.5)
nRBC: 0 % (ref 0.0–0.2)

## 2021-07-28 LAB — BASIC METABOLIC PANEL
Anion gap: 7 (ref 5–15)
BUN: 21 mg/dL (ref 8–23)
CO2: 27 mmol/L (ref 22–32)
Calcium: 9.8 mg/dL (ref 8.9–10.3)
Chloride: 109 mmol/L (ref 98–111)
Creatinine, Ser: 0.85 mg/dL (ref 0.61–1.24)
GFR, Estimated: >60 mL/min (ref >60)
Glucose, Bld: 152 mg/dL — ABNORMAL HIGH (ref 70–99)
Potassium: 3.9 mmol/L (ref 3.5–5.1)
Sodium: 143 mmol/L (ref 135–145)

## 2021-07-28 LAB — SURGICAL PCR SCREEN
MRSA, PCR: NEGATIVE
Staphylococcus aureus: NEGATIVE

## 2021-07-28 LAB — GLUCOSE, CAPILLARY: Glucose-Capillary: 165 mg/dL — ABNORMAL HIGH (ref 70–99)

## 2021-07-28 LAB — NO BLOOD PRODUCTS

## 2021-08-05 ENCOUNTER — Other Ambulatory Visit: Payer: Self-pay | Admitting: Orthopaedic Surgery

## 2021-08-05 LAB — SARS CORONAVIRUS 2 (TAT 6-24 HRS): SARS Coronavirus 2: NEGATIVE

## 2021-08-06 NOTE — H&P (Signed)
TOTAL KNEE ADMISSION H&P  Patient is being admitted for left total knee arthroplasty.  Subjective:  Chief Complaint:left knee pain.  HPI: Samuel [name], 70 y.o. male, has a history of pain and functional disability in the left knee due to arthritis and has failed non-surgical conservative treatments for greater than 12 weeks to includeNSAID's and/or analgesics, corticosteriod injections, flexibility and strengthening excercises, use of assistive devices, weight reduction as appropriate, and activity modification.  Onset of symptoms was gradual, starting 2 years ago with gradually worsening course since that time. The patient noted no past surgery on the left knee(s).  Patient currently rates pain in the left knee(s) at 10 out of 10 with activity. Patient has night pain, worsening of pain with activity and weight bearing, pain that interferes with activities of daily living, pain with passive range of motion, crepitus, and joint swelling.  Patient has evidence of subchondral sclerosis, periarticular osteophytes, and joint space narrowing by imaging studies. There is no active infection.  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) 05/01/2015   Cough 01/02/2015   Diabetes (HCC) 10/02/2014   Essential hypertension, benign 06/17/2014   Type II or unspecified type diabetes mellitus without mention of complication, not stated as uncontrolled 06/17/2014   Hyperlipidemia 06/17/2014   Hip pain 06/17/2014   Back pain 06/17/2014   Frequent urination 06/17/2014   Unspecified asthma(493.90) 06/17/2014   Asthma 06/17/2014   Diabetes mellitus type 2, uncontrolled (HCC) 01/26/2012   Past Medical  History:  Diagnosis Date   Blood transfusion without reported diagnosis    Diabetes mellitus without complication (HCC)    type II   History of ETOH abuse    Hypertension     Past Surgical History:  Procedure Laterality Date   BACK SURGERY N/A 2010    No current facility-administered medications for this encounter.   Current Outpatient Medications  Medication Sig Dispense Refill Last Dose   aspirin EC 81 MG tablet Take 81 mg by mouth daily as needed for mild pain.      atorvastatin (LIPITOR) 40 MG tablet TAKE 1 TABLET BY MOUTH DAILY THEN TAKE 1/2 TABLET BY MOUTH AT BEDTIME (Patient taking differently: Take 40 mg by mouth daily.) 135 tablet 0    gabapentin (NEURONTIN) 100 MG capsule TAKE 1 CAPSULE(100 MG) BY MOUTH THREE TIMES DAILY (Patient taking differently: Take 100 mg by mouth 3 (three) times daily.) 90 capsule 3    insulin lispro (HUMALOG) 100 UNIT/ML injection INJECT UP TO 90 UNITS UNDER THE SKIN DAILY VIA INSULIN PUMP (Patient taking differently: Inject 90 Units into the skin daily at 6 (six) AM. INJECT UP TO 90 UNITS UNDER THE SKIN DAILY VIA INSULIN PUMP) 90 mL 3    losartan (COZAAR) 50 MG tablet TAKE 1 TABLET(50 MG) BY MOUTH DAILY (Patient taking differently: Take 50 mg by mouth daily.) 90 tablet 1    NON FORMULARY Take 2 capsules by mouth daily. 4LIFE TRANSFER FACTORY CARDIO      OVER THE COUNTER MEDICATION Take 2 tablets by mouth daily. 4LIFE TRANSFER FACTORY RECALL      SYNJARDY XR 03-999 MG TB24 TAKE 2 TABLETS BY MOUTH DAILY 60 tablet 2    traMADol (ULTRAM)  50 MG tablet Take 1 tablet by mouth every 6 (six) hours as needed for pain.      TRULICITY 1.5 MG/0.5ML SOPN ADMINISTER 1.5 MG UNDER THE SKIN 1 TIME WEEKLY AS DIRECTED (Patient taking differently: Inject 1.5 mg into the skin once a week. Thursday) 2 mL 2    B-D UF III MINI PEN NEEDLES 31G X 5 MM MISC USE FIVE PER DAY AS DIRECTED 200 each 0    glucose blood (FREESTYLE LITE) test strip USE AS INSTRUCTED FOUR TIMES DAILY  (Patient not taking: No sig reported) 150 strip 2    Insulin Disposable Pump (OMNIPOD 5 PACK) MISC CHANGE EVERY 48 HOURS AS DIRECTED 45 each 2    Insulin Pen Needle 32G X 4 MM MISC Use 5 pens per day to inject insulin 200 each 3    Insulin Syringe-Needle U-100 (INSULIN SYRINGE .5CC/30GX5/16") 30G X 5/16" 0.5 ML MISC Use 3 times a day for insulin 100 each 1    No Known Allergies  Social History   Tobacco Use   Smoking status: Former   Smokeless tobacco: Former    Quit date: 11/08/1993  Substance Use Topics   Alcohol use: No    Family History  Problem Relation Age of Onset   Diabetes Unknown    Hypertension Unknown    Arthritis Father    Diabetes Father      Review of Systems  Musculoskeletal:  Positive for gait problem and joint swelling.  All other systems reviewed and are negative.  Objective:  Physical Exam Vitals reviewed.  Constitutional:      Appearance: Normal appearance. He is normal weight.  HENT:     Head: Normocephalic and atraumatic.  Eyes:     Extraocular Movements: Extraocular movements intact.     Pupils: Pupils are equal, round, and reactive to light.  Cardiovascular:     Rate and Rhythm: Normal rate.     Pulses: Normal pulses.  Pulmonary:     Effort: Pulmonary effort is normal.     Breath sounds: Normal breath sounds.  Abdominal:     Palpations: Abdomen is soft.  Musculoskeletal:     Cervical back: Normal range of motion.     Left knee: Effusion and bony tenderness present. Decreased range of motion. Tenderness present over the medial joint line, lateral joint line and patellar tendon. Abnormal alignment and abnormal meniscus.  Neurological:     Mental Status: He is alert and oriented to person, place, and time.  Psychiatric:        Behavior: Behavior normal.    Vital signs in last 24 hours:    Labs:   Estimated body mass index is 36.56 kg/m as calculated from the following:   Height as of 07/28/21: 5\' 3"  (1.6 m).   Weight as of 07/28/21:  93.6 kg.   Imaging Review Plain radiographs demonstrate severe degenerative joint disease of the left knee(s). The overall alignment ismild varus. The bone quality appears to be excellent for age and reported activity level.      Assessment/Plan:  End stage arthritis, left knee   The patient history, physical examination, clinical judgment of the provider and imaging studies are consistent with end stage degenerative joint disease of the left knee(s) and total knee arthroplasty is deemed medically necessary. The treatment options including medical management, injection therapy arthroscopy and arthroplasty were discussed at length. The risks and benefits of total knee arthroplasty were presented and reviewed. The risks due to aseptic loosening, infection, stiffness, patella  tracking problems, thromboembolic complications and other imponderables were discussed. The patient acknowledged the explanation, agreed to proceed with the plan and consent was signed. Patient is being admitted for inpatient treatment for surgery, pain control, PT, OT, prophylactic antibiotics, VTE prophylaxis, progressive ambulation and ADL's and discharge planning. The patient is planning to be discharged home with home health services

## 2021-08-07 ENCOUNTER — Observation Stay (HOSPITAL_COMMUNITY)
Admission: RE | Admit: 2021-08-07 | Discharge: 2021-08-08 | Disposition: A | Discharge status: Home / Self Care | Payer: Medicare Other | Attending: Orthopaedic Surgery | Admitting: Orthopaedic Surgery

## 2021-08-07 ENCOUNTER — Observation Stay (HOSPITAL_COMMUNITY): Payer: Medicare Other

## 2021-08-07 ENCOUNTER — Ambulatory Visit (HOSPITAL_COMMUNITY): Payer: Medicare Other | Admitting: Physician Assistant

## 2021-08-07 ENCOUNTER — Encounter (HOSPITAL_COMMUNITY)
Admission: RE | Disposition: A | Discharge status: Home / Self Care | Payer: Self-pay | Source: Home / Self Care | Attending: Orthopaedic Surgery

## 2021-08-07 ENCOUNTER — Ambulatory Visit (HOSPITAL_COMMUNITY): Payer: Medicare Other | Admitting: Anesthesiology

## 2021-08-07 ENCOUNTER — Other Ambulatory Visit: Payer: Self-pay

## 2021-08-07 ENCOUNTER — Encounter (HOSPITAL_COMMUNITY): Payer: Self-pay | Admitting: Orthopaedic Surgery

## 2021-08-07 DIAGNOSIS — Z7982 Long term (current) use of aspirin: Secondary | ICD-10-CM | POA: Diagnosis not present

## 2021-08-07 DIAGNOSIS — Z794 Long term (current) use of insulin: Secondary | ICD-10-CM | POA: Insufficient documentation

## 2021-08-07 DIAGNOSIS — Z87891 Personal history of nicotine dependence: Secondary | ICD-10-CM | POA: Insufficient documentation

## 2021-08-07 DIAGNOSIS — J45909 Unspecified asthma, uncomplicated: Secondary | ICD-10-CM | POA: Diagnosis not present

## 2021-08-07 DIAGNOSIS — Z96652 Presence of left artificial knee joint: Secondary | ICD-10-CM

## 2021-08-07 DIAGNOSIS — I10 Essential (primary) hypertension: Secondary | ICD-10-CM | POA: Diagnosis not present

## 2021-08-07 DIAGNOSIS — E119 Type 2 diabetes mellitus without complications: Secondary | ICD-10-CM | POA: Diagnosis not present

## 2021-08-07 DIAGNOSIS — M1712 Unilateral primary osteoarthritis, left knee: Secondary | ICD-10-CM | POA: Diagnosis not present

## 2021-08-07 DIAGNOSIS — Z7984 Long term (current) use of oral hypoglycemic drugs: Secondary | ICD-10-CM | POA: Diagnosis not present

## 2021-08-07 DIAGNOSIS — Z79899 Other long term (current) drug therapy: Secondary | ICD-10-CM | POA: Insufficient documentation

## 2021-08-07 HISTORY — PX: TOTAL KNEE ARTHROPLASTY: SHX125

## 2021-08-07 LAB — GLUCOSE, CAPILLARY
Glucose-Capillary: 149 mg/dL — ABNORMAL HIGH (ref 70–99)
Glucose-Capillary: 135 mg/dL — ABNORMAL HIGH (ref 70–99)
Glucose-Capillary: 293 mg/dL — ABNORMAL HIGH (ref 70–99)
Glucose-Capillary: 187 mg/dL — ABNORMAL HIGH (ref 70–99)

## 2021-08-07 SURGERY — ARTHROPLASTY, KNEE, TOTAL
Anesthesia: Monitor Anesthesia Care | Site: Knee | Laterality: Left

## 2021-08-07 MED ORDER — MIDAZOLAM HCL 2 MG/2ML IJ SOLN
INTRAMUSCULAR | Status: AC
  Filled 2021-08-07: qty 2

## 2021-08-07 MED ORDER — FENTANYL CITRATE PF 50 MCG/ML IJ SOSY
50.0000 ug | PREFILLED_SYRINGE | INTRAMUSCULAR | Status: DC
  Administered 2021-08-07: 50 ug via INTRAVENOUS
  Filled 2021-08-07: qty 2

## 2021-08-07 MED ORDER — MIDAZOLAM HCL 5 MG/5ML IJ SOLN
INTRAMUSCULAR | Status: DC | PRN
  Administered 2021-08-07: 2 mg via INTRAVENOUS

## 2021-08-07 MED ORDER — PHENYLEPHRINE HCL (PRESSORS) 10 MG/ML IV SOLN
INTRAVENOUS | Status: AC
  Filled 2021-08-07: qty 2

## 2021-08-07 MED ORDER — METHOCARBAMOL 500 MG IVPB - SIMPLE MED
500.0000 mg | Freq: Four times a day (QID) | INTRAVENOUS | Status: DC | PRN
  Filled 2021-08-07: qty 50

## 2021-08-07 MED ORDER — PROPOFOL 10 MG/ML IV BOLUS
INTRAVENOUS | Status: AC
  Filled 2021-08-07: qty 20

## 2021-08-07 MED ORDER — MIDAZOLAM HCL 2 MG/2ML IJ SOLN
1.0000 mg | INTRAMUSCULAR | Status: DC
  Administered 2021-08-07: 2 mg via INTRAVENOUS
  Filled 2021-08-07: qty 2

## 2021-08-07 MED ORDER — ALUM & MAG HYDROXIDE-SIMETH 200-200-20 MG/5ML PO SUSP: 30.0000 mL | ORAL | Status: DC | PRN

## 2021-08-07 MED ORDER — DEXAMETHASONE SODIUM PHOSPHATE 10 MG/ML IJ SOLN
INTRAMUSCULAR | Status: DC | PRN
  Administered 2021-08-07: 10 mg via INTRAVENOUS

## 2021-08-07 MED ORDER — ACETAMINOPHEN 10 MG/ML IV SOLN: 1000.0000 mg | Freq: Once | INTRAVENOUS | Status: DC | PRN

## 2021-08-07 MED ORDER — FENTANYL CITRATE PF 50 MCG/ML IJ SOSY: 25.0000 ug | PREFILLED_SYRINGE | INTRAMUSCULAR | Status: DC | PRN

## 2021-08-07 MED ORDER — DOCUSATE SODIUM 100 MG PO CAPS
100.0000 mg | ORAL_CAPSULE | Freq: Two times a day (BID) | ORAL | Status: DC
  Administered 2021-08-07 – 2021-08-08 (×2): 100 mg via ORAL
  Filled 2021-08-07 (×2): qty 1

## 2021-08-07 MED ORDER — OXYCODONE HCL 5 MG PO TABS
5.0000 mg | ORAL_TABLET | ORAL | Status: DC | PRN
  Administered 2021-08-07: 5 mg via ORAL
  Administered 2021-08-08: 10 mg via ORAL
  Filled 2021-08-07: qty 2
  Filled 2021-08-07: qty 1
  Filled 2021-08-07: qty 2

## 2021-08-07 MED ORDER — METOCLOPRAMIDE HCL 5 MG PO TABS: 5.0000 mg | ORAL_TABLET | Freq: Three times a day (TID) | ORAL | Status: DC | PRN

## 2021-08-07 MED ORDER — METHOCARBAMOL 500 MG PO TABS
500.0000 mg | ORAL_TABLET | Freq: Four times a day (QID) | ORAL | Status: DC | PRN
  Administered 2021-08-07 – 2021-08-08 (×2): 500 mg via ORAL
  Filled 2021-08-07 (×2): qty 1

## 2021-08-07 MED ORDER — PHENYLEPHRINE 40 MCG/ML (10ML) SYRINGE FOR IV PUSH (FOR BLOOD PRESSURE SUPPORT)
PREFILLED_SYRINGE | INTRAVENOUS | Status: AC
  Filled 2021-08-07: qty 10

## 2021-08-07 MED ORDER — METOCLOPRAMIDE HCL 5 MG/ML IJ SOLN: 5.0000 mg | Freq: Three times a day (TID) | INTRAMUSCULAR | Status: DC | PRN

## 2021-08-07 MED ORDER — MENTHOL 3 MG MT LOZG: 1.0000 | LOZENGE | OROMUCOSAL | Status: DC | PRN

## 2021-08-07 MED ORDER — LACTATED RINGERS IV SOLN: INTRAVENOUS | Status: DC

## 2021-08-07 MED ORDER — SODIUM CHLORIDE 0.9 % IR SOLN
Status: DC | PRN
  Administered 2021-08-07: 1000 mL

## 2021-08-07 MED ORDER — ROPIVACAINE HCL 5 MG/ML IJ SOLN
INTRAMUSCULAR | Status: DC | PRN
  Administered 2021-08-07: 25 mL via PERINEURAL

## 2021-08-07 MED ORDER — INSULIN ASPART 100 UNIT/ML IJ SOLN: 0.0000 [IU] | Freq: Three times a day (TID) | INTRAMUSCULAR | Status: DC

## 2021-08-07 MED ORDER — ONDANSETRON HCL 4 MG/2ML IJ SOLN: 4.0000 mg | Freq: Four times a day (QID) | INTRAMUSCULAR | Status: DC | PRN

## 2021-08-07 MED ORDER — INSULIN ASPART 100 UNIT/ML IJ SOLN: 0.0000 [IU] | Freq: Every day | INTRAMUSCULAR | Status: DC

## 2021-08-07 MED ORDER — TRANEXAMIC ACID-NACL 1000-0.7 MG/100ML-% IV SOLN
1000.0000 mg | INTRAVENOUS | Status: AC
  Administered 2021-08-07: 1000 mg via INTRAVENOUS
  Filled 2021-08-07: qty 100

## 2021-08-07 MED ORDER — BUPIVACAINE IN DEXTROSE 0.75-8.25 % IT SOLN
INTRATHECAL | Status: DC | PRN
  Administered 2021-08-07: 1.6 mL via INTRATHECAL

## 2021-08-07 MED ORDER — SODIUM CHLORIDE 0.9 % IV SOLN: INTRAVENOUS | Status: DC

## 2021-08-07 MED ORDER — ATORVASTATIN CALCIUM 40 MG PO TABS
40.0000 mg | ORAL_TABLET | Freq: Every day | ORAL | Status: DC
  Administered 2021-08-08: 40 mg via ORAL
  Filled 2021-08-07: qty 1

## 2021-08-07 MED ORDER — ASPIRIN 81 MG PO CHEW
81.0000 mg | CHEWABLE_TABLET | Freq: Two times a day (BID) | ORAL | Status: DC
  Administered 2021-08-07 – 2021-08-08 (×2): 81 mg via ORAL
  Filled 2021-08-07 (×2): qty 1

## 2021-08-07 MED ORDER — GABAPENTIN 100 MG PO CAPS
100.0000 mg | ORAL_CAPSULE | Freq: Three times a day (TID) | ORAL | Status: DC
  Administered 2021-08-07 – 2021-08-08 (×3): 100 mg via ORAL
  Filled 2021-08-07 (×3): qty 1

## 2021-08-07 MED ORDER — STERILE WATER FOR IRRIGATION IR SOLN
Status: DC | PRN
  Administered 2021-08-07: 2000 mL

## 2021-08-07 MED ORDER — ONDANSETRON HCL 4 MG/2ML IJ SOLN
INTRAMUSCULAR | Status: AC
  Filled 2021-08-07: qty 2

## 2021-08-07 MED ORDER — INSULIN PUMP
Freq: Three times a day (TID) | SUBCUTANEOUS | Status: DC
  Administered 2021-08-07: 18 via SUBCUTANEOUS
  Administered 2021-08-08: 10 via SUBCUTANEOUS
  Filled 2021-08-07: qty 1

## 2021-08-07 MED ORDER — PHENOL 1.4 % MT LIQD: 1.0000 | OROMUCOSAL | Status: DC | PRN

## 2021-08-07 MED ORDER — POVIDONE-IODINE 10 % EX SWAB: 2.0000 "application " | Freq: Once | CUTANEOUS | Status: DC

## 2021-08-07 MED ORDER — HYDROMORPHONE HCL 1 MG/ML IJ SOLN
0.5000 mg | INTRAMUSCULAR | Status: DC | PRN
  Administered 2021-08-08: 1 mg via INTRAVENOUS
  Filled 2021-08-07: qty 1

## 2021-08-07 MED ORDER — LOSARTAN POTASSIUM 50 MG PO TABS
50.0000 mg | ORAL_TABLET | Freq: Every day | ORAL | Status: DC
  Administered 2021-08-07 – 2021-08-08 (×2): 50 mg via ORAL
  Filled 2021-08-07 (×2): qty 1

## 2021-08-07 MED ORDER — POLYETHYLENE GLYCOL 3350 17 G PO PACK: 17.0000 g | PACK | Freq: Every day | ORAL | Status: DC | PRN

## 2021-08-07 MED ORDER — OXYCODONE HCL 5 MG PO TABS
10.0000 mg | ORAL_TABLET | ORAL | Status: DC | PRN
  Administered 2021-08-07: 10 mg via ORAL

## 2021-08-07 MED ORDER — PROPOFOL 500 MG/50ML IV EMUL
INTRAVENOUS | Status: DC | PRN
  Administered 2021-08-07: 75 ug/kg/min via INTRAVENOUS

## 2021-08-07 MED ORDER — PANTOPRAZOLE SODIUM 40 MG PO TBEC
40.0000 mg | DELAYED_RELEASE_TABLET | Freq: Every day | ORAL | Status: DC
  Administered 2021-08-07 – 2021-08-08 (×2): 40 mg via ORAL
  Filled 2021-08-07 (×2): qty 1

## 2021-08-07 MED ORDER — ORAL CARE MOUTH RINSE: 15.0000 mL | Freq: Once | OROMUCOSAL | Status: DC

## 2021-08-07 MED ORDER — ONDANSETRON HCL 4 MG PO TABS: 4.0000 mg | ORAL_TABLET | Freq: Four times a day (QID) | ORAL | Status: DC | PRN

## 2021-08-07 MED ORDER — FENTANYL CITRATE (PF) 100 MCG/2ML IJ SOLN
INTRAMUSCULAR | Status: DC | PRN
  Administered 2021-08-07: 100 ug via INTRAVENOUS

## 2021-08-07 MED ORDER — METFORMIN HCL ER 500 MG PO TB24
2000.0000 mg | ORAL_TABLET | Freq: Every day | ORAL | Status: DC
  Administered 2021-08-08: 2000 mg via ORAL
  Filled 2021-08-07 (×2): qty 4

## 2021-08-07 MED ORDER — PROPOFOL 1000 MG/100ML IV EMUL
INTRAVENOUS | Status: AC
  Filled 2021-08-07: qty 100

## 2021-08-07 MED ORDER — DEXAMETHASONE SODIUM PHOSPHATE 10 MG/ML IJ SOLN
INTRAMUSCULAR | Status: AC
  Filled 2021-08-07: qty 1

## 2021-08-07 MED ORDER — CEFAZOLIN SODIUM-DEXTROSE 2-4 GM/100ML-% IV SOLN
2.0000 g | INTRAVENOUS | Status: AC
  Administered 2021-08-07: 2 g via INTRAVENOUS
  Filled 2021-08-07: qty 100

## 2021-08-07 MED ORDER — CHLORHEXIDINE GLUCONATE 0.12 % MT SOLN: 15.0000 mL | Freq: Once | OROMUCOSAL | Status: DC

## 2021-08-07 MED ORDER — EMPAGLIFLOZIN 10 MG PO TABS
10.0000 mg | ORAL_TABLET | Freq: Every day | ORAL | Status: DC
  Administered 2021-08-08: 10 mg via ORAL
  Filled 2021-08-07 (×2): qty 1

## 2021-08-07 MED ORDER — CEFAZOLIN SODIUM-DEXTROSE 1-4 GM/50ML-% IV SOLN
1.0000 g | Freq: Four times a day (QID) | INTRAVENOUS | Status: AC
  Administered 2021-08-07 (×2): 1 g via INTRAVENOUS
  Filled 2021-08-07 (×2): qty 50

## 2021-08-07 MED ORDER — EMPAGLIFLOZIN-METFORMIN HCL ER 5-1000 MG PO TB24: 2.0000 | ORAL_TABLET | Freq: Every day | ORAL | Status: DC

## 2021-08-07 MED ORDER — DIPHENHYDRAMINE HCL 12.5 MG/5ML PO ELIX: 12.5000 mg | ORAL_SOLUTION | ORAL | Status: DC | PRN

## 2021-08-07 MED ORDER — INSULIN ASPART 100 UNIT/ML IJ SOLN: 90.0000 [IU] | Freq: Every day | INTRAMUSCULAR | Status: DC

## 2021-08-07 MED ORDER — FENTANYL CITRATE (PF) 100 MCG/2ML IJ SOLN
INTRAMUSCULAR | Status: AC
  Filled 2021-08-07: qty 2

## 2021-08-07 MED ORDER — 0.9 % SODIUM CHLORIDE (POUR BTL) OPTIME
TOPICAL | Status: DC | PRN
  Administered 2021-08-07: 1000 mL

## 2021-08-07 MED ORDER — ACETAMINOPHEN 325 MG PO TABS: 325.0000 mg | ORAL_TABLET | Freq: Four times a day (QID) | ORAL | Status: DC | PRN

## 2021-08-07 MED ORDER — ONDANSETRON HCL 4 MG/2ML IJ SOLN
INTRAMUSCULAR | Status: DC | PRN
  Administered 2021-08-07: 4 mg via INTRAVENOUS

## 2021-08-07 SURGICAL SUPPLY — 54 items
BAG COUNTER SPONGE SURGICOUNT (BAG) ×2 IMPLANT
BAG ZIPLOCK 12X15 (MISCELLANEOUS) IMPLANT
BENZOIN TINCTURE PRP APPL 2/3 (GAUZE/BANDAGES/DRESSINGS) IMPLANT
BLADE SAG 18X100X1.27 (BLADE) ×2 IMPLANT
BLADE SURG SZ10 CARB STEEL (BLADE) ×4 IMPLANT
BNDG ELASTIC 6X5.8 VLCR STR LF (GAUZE/BANDAGES/DRESSINGS) ×2 IMPLANT
BOWL SMART MIX CTS (DISPOSABLE) IMPLANT
COOLER ICEMAN CLASSIC (MISCELLANEOUS) ×2 IMPLANT
COVER SURGICAL LIGHT HANDLE (MISCELLANEOUS) ×2 IMPLANT
CUFF TOURN SGL QUICK 34 (TOURNIQUET CUFF) ×1
CUFF TRNQT CYL 34X4.125X (TOURNIQUET CUFF) ×1 IMPLANT
DECANTER SPIKE VIAL GLASS SM (MISCELLANEOUS) IMPLANT
DRAPE INCISE IOBAN 66X45 STRL (DRAPES) ×2 IMPLANT
DRAPE U-SHAPE 47X51 STRL (DRAPES) ×2 IMPLANT
DRSG PAD ABDOMINAL 8X10 ST (GAUZE/BANDAGES/DRESSINGS) ×4 IMPLANT
DURAPREP 26ML APPLICATOR (WOUND CARE) ×2 IMPLANT
ELECT BLADE TIP CTD 4 INCH (ELECTRODE) ×2 IMPLANT
ELECT REM PT RETURN 15FT ADLT (MISCELLANEOUS) ×2 IMPLANT
FEMORAL POSTERIOR SZ5 LFT (Femur) ×1 IMPLANT
GAUZE SPONGE 4X4 12PLY STRL (GAUZE/BANDAGES/DRESSINGS) ×2 IMPLANT
GAUZE XEROFORM 1X8 LF (GAUZE/BANDAGES/DRESSINGS) ×2 IMPLANT
GLOVE SRG 8 PF TXTR STRL LF DI (GLOVE) ×2 IMPLANT
GLOVE SURG ENC MOIS LTX SZ7.5 (GLOVE) ×2 IMPLANT
GLOVE SURG NEOPR MICRO LF SZ8 (GLOVE) ×2 IMPLANT
GLOVE SURG UNDER POLY LF SZ8 (GLOVE) ×2
GOWN STRL REUS W/TWL XL LVL3 (GOWN DISPOSABLE) ×4 IMPLANT
HANDPIECE INTERPULSE COAX TIP (DISPOSABLE) ×1
HOLDER FOLEY CATH W/STRAP (MISCELLANEOUS) ×2 IMPLANT
IMMOBILIZER KNEE 20 (SOFTGOODS) ×2
IMMOBILIZER KNEE 20 THIGH 36 (SOFTGOODS) ×1 IMPLANT
INSERT TIB PS SZ5 11 KNEE (Miscellaneous) ×2 IMPLANT
KIT TURNOVER KIT A (KITS) ×2 IMPLANT
KNEE PATELLA ASYMMETRIC 10X32 (Knees) ×2 IMPLANT
KNEE TIBIAL COMPONENT SZ5 (Knees) ×2 IMPLANT
NS IRRIG 1000ML POUR BTL (IV SOLUTION) IMPLANT
PACK TOTAL KNEE CUSTOM (KITS) ×2 IMPLANT
PAD COLD SHLDR WRAP-ON (PAD) ×2 IMPLANT
PADDING CAST COTTON 6X4 STRL (CAST SUPPLIES) ×2 IMPLANT
PIN FLUTED HEDLESS FIX 3.5X1/8 (PIN) ×2 IMPLANT
POSTERIOR FEMORAL SZ5 LFT (Femur) ×2 IMPLANT
PROTECTOR NERVE ULNAR (MISCELLANEOUS) ×2 IMPLANT
SET HNDPC FAN SPRY TIP SCT (DISPOSABLE) ×1 IMPLANT
SET PAD KNEE POSITIONER (MISCELLANEOUS) ×2 IMPLANT
SPONGE T-LAP 18X18 ~~LOC~~+RFID (SPONGE) ×4 IMPLANT
STAPLER VISISTAT 35W (STAPLE) ×2 IMPLANT
STRIP CLOSURE SKIN 1/2X4 (GAUZE/BANDAGES/DRESSINGS) IMPLANT
SUT MNCRL AB 4-0 PS2 18 (SUTURE) IMPLANT
SUT VIC AB 0 CT1 27 (SUTURE) ×1
SUT VIC AB 0 CT1 27XBRD ANTBC (SUTURE) ×1 IMPLANT
SUT VIC AB 1 CT1 36 (SUTURE) ×4 IMPLANT
SUT VIC AB 2-0 CT1 27 (SUTURE) ×2
SUT VIC AB 2-0 CT1 TAPERPNT 27 (SUTURE) ×2 IMPLANT
TRAY FOLEY MTR SLVR 16FR STAT (SET/KITS/TRAYS/PACK) ×2 IMPLANT
WATER STERILE IRR 1000ML POUR (IV SOLUTION) IMPLANT

## 2021-08-07 NOTE — Anesthesia Procedure Notes (Signed)
Anesthesia Regional Block: Adductor canal block   Pre-Anesthetic Checklist: , timeout performed,  Correct Patient, Correct Site, Correct Laterality,  Correct Procedure, Correct Position, site marked,  Risks and benefits discussed,  Surgical consent,  Pre-op evaluation,  At surgeon's request and post-op pain management  Laterality: Left  Prep: Dura Prep       Needles:  Injection technique: Single-shot  Needle Type: Echogenic Stimulator Needle     Needle Length: 10cm  Needle Gauge: 20     Additional Needles:   Procedures:,,,, ultrasound used (permanent image in chart),,    Narrative:  Start time: 08/07/2021 10:22 AM End time: 08/07/2021 10:24 AM Injection made incrementally with aspirations every 5 mL.  Performed by: Personally  Anesthesiologist: Atilano Median, DO  Additional Notes: Patient identified. Risks/Benefits/Options discussed with patient including but not limited to bleeding, infection, nerve damage, failed block, incomplete pain control. Patient expressed understanding and wished to proceed. All questions were answered. Sterile technique was used throughout the entire procedure. Please see nursing notes for vital signs. Aspirated in 5cc intervals with injection for negative confirmation. Patient was given instructions on fall risk and not to get out of bed. All questions and concerns addressed with instructions to call with any issues or inadequate analgesia.

## 2021-08-07 NOTE — Anesthesia Postprocedure Evaluation (Signed)
Anesthesia Post Note  Patient: Samuel [name]  Procedure(s) Performed: LEFT TOTAL KNEE ARTHROPLASTY (Left: Knee)     Patient location during evaluation: PACU Anesthesia Type: Regional, MAC and Spinal Level of consciousness: oriented and awake and alert Pain management: pain level controlled Vital Signs Assessment: post-procedure vital signs reviewed and stable Respiratory status: spontaneous breathing, respiratory function stable and patient connected to nasal cannula oxygen Cardiovascular status: blood pressure returned to baseline and stable Postop Assessment: no headache, no backache and no apparent nausea or vomiting Anesthetic complications: no   No notable events documented.  Last Vitals:  Vitals:   08/07/21 1415 08/07/21 1449  BP: (!) 149/76 121/68  Pulse: 66 66  Resp: 10 16  Temp:  36.7 C  SpO2: 98% 98%    Last Pain:  Vitals:   08/07/21 1449  TempSrc: Oral  PainSc: 4                  Trula Frede P Marshal Eskew

## 2021-08-07 NOTE — Brief Op Note (Signed)
08/07/2021  12:41 PM  PATIENT:  Samuel [name]  70 y.o. male  PRE-OPERATIVE DIAGNOSIS:  osteoarthritis left knee  POST-OPERATIVE DIAGNOSIS:  osteoarthritis left knee  PROCEDURE:  Procedure(s): LEFT TOTAL KNEE ARTHROPLASTY (Left)  SURGEON:  Surgeon(s) and Role:    Kathryne Hitch, MD - Primary  PHYSICIAN ASSISTANT:  Rexene Edison, PA-C assisted  ANESTHESIA:   regional and spinal  EBL:  100 mL   COUNTS:  YES  TOURNIQUET:   Total Tourniquet Time Documented: Thigh (Left) - 42 minutes Total: Thigh (Left) - 42 minutes   DICTATION: .Other Dictation: Dictation Number [id number]  PLAN OF CARE: Admit for overnight observation  PATIENT DISPOSITION:  PACU - hemodynamically stable.   Delay start of Pharmacological VTE agent (>24hrs) due to surgical blood loss or risk of bleeding: no

## 2021-08-07 NOTE — Transfer of Care (Signed)
Immediate Anesthesia Transfer of Care Note  Patient: Samuel [name]  Procedure(s) Performed: LEFT TOTAL KNEE ARTHROPLASTY (Left: Knee)  Patient Location: PACU  Anesthesia Type:Spinal  Level of Consciousness: awake and patient cooperative  Airway & Oxygen Therapy: Patient Spontanous Breathing and Patient connected to face mask oxygen  Post-op Assessment: Report given to RN and Post -op Vital signs reviewed and stable  Post vital signs: stable  Last Vitals:  Vitals Value Taken Time  BP 123/79 08/07/21 1300  Temp    Pulse 71 08/07/21 1303  Resp 19 08/07/21 1303  SpO2 100 % 08/07/21 1303  Vitals shown include unvalidated device data.  Last Pain:  Vitals:   08/07/21 1043  TempSrc:   PainSc: 0-No pain      Patients Stated Pain Goal: (P) 3 (08/07/21 1018)  Complications: No notable events documented.

## 2021-08-07 NOTE — Progress Notes (Signed)
Inpatient Diabetes Program Recommendations  AACE/ADA: New Consensus Statement on Inpatient Glycemic Control (2015)  Target Ranges:  Prepandial:   less than 140 mg/dL      Peak postprandial:   less than 180 mg/dL (1-2 hours)      Critically ill patients:  140 - 180 mg/dL   Lab Results  Component Value Date   GLUCAP 149 (H) 08/07/2021   HGBA1C 7.3 (H) 06/22/2021    Review of Glycemic Control  Diabetes history: DM2 Outpatient Diabetes medications: OmniPod (see below), metformin 1000 mg BID, Trulicity 1.5 mg weekly, Synjardy XR 03/999, 2 Tabs QD Current orders for Inpatient glycemic control: None  HgbA1C - 7.3%  INSULIN regimen is: OMNIPOD INSULIN PUMP   Basal rate settings: Midnight = 1.3, 6am: 1.65, 5 PM = 1.6.  Boluses 12 units breakfast -14 units lunch and 18 units at dinner at meals Total daily insulin up to 80 units   Carbohydrate coverage 1:10 and correction 1:50 Usual mealtime boluses: 12 units at breakfast, 14 at lunch and 18 at dinner     Non-insulin hypoglycemic drugs: Metformin 1 g twice a day, Trulicity 1.5 mg weekly, Synjardy XR 03/999, 2 tablets daily  Inpatient Diabetes Program Recommendations:    Will need insulin pump order set. Pt should be able to keep insulin pump on throughout surgery. Will not bolus for carbohydrates until pt is eating.  Check blood sugars every hour while in surgery and PACU.  Available to see pt in PACU if needed.   Thank you. Ailene Ards, RD, LDN, CDE Inpatient Diabetes Coordinator [phone]

## 2021-08-07 NOTE — Anesthesia Procedure Notes (Addendum)
Spinal  Patient location during procedure: OR Start time: 08/07/2021 11:20 AM End time: 08/07/2021 11:22 AM Staffing Performed: anesthesiologist  Anesthesiologist: Atilano Median, DO Preanesthetic Checklist Completed: patient identified, IV checked, site marked, risks and benefits discussed, surgical consent, monitors and equipment checked, pre-op evaluation and timeout performed Spinal Block Patient position: sitting Prep: DuraPrep Patient monitoring: heart rate, cardiac monitor, continuous pulse ox and blood pressure Approach: midline Location: L4-5 Injection technique: single-shot Needle Needle type: Pencan  Needle gauge: 24 G Needle length: 10 cm Assessment Events: CSF return Additional Notes Patient identified. Risks/Benefits/Options discussed with patient including but not limited to bleeding, infection, nerve damage, paralysis, failed block, incomplete pain control, headache, blood pressure changes, nausea, vomiting, reactions to medications, itching and postpartum back pain. Confirmed with bedside nurse the patient's most recent platelet count. Confirmed with patient that they are not currently taking any anticoagulation, have any bleeding history or any family history of bleeding disorders. Patient expressed understanding and wished to proceed. All questions were answered. Sterile technique was used throughout the entire procedure. Please see nursing notes for vital signs. Warning signs of high block given to the patient including shortness of breath, tingling/numbness in hands, complete motor block, or any concerning symptoms with instructions to call for help. Patient was given instructions on fall risk and not to get out of bed. All questions and concerns addressed with instructions to call with any issues or inadequate analgesia.

## 2021-08-07 NOTE — Anesthesia Preprocedure Evaluation (Addendum)
Anesthesia Evaluation  Patient identified by MRN, date of birth, ID band Patient awake    Reviewed: Allergy & Precautions, NPO status , Patient's Chart, lab work & pertinent test results  Airway Mallampati: II  TM Distance: >3 FB Neck ROM: Full    Dental  (+) Teeth Intact,    Pulmonary asthma , former smoker,    Pulmonary exam normal        Cardiovascular hypertension, Pt. on medications  Rhythm:Regular Rate:Normal     Neuro/Psych negative neurological ROS  negative psych ROS   GI/Hepatic negative GI ROS, Neg liver ROS,   Endo/Other  diabetes, Type 2, Insulin Dependent  Renal/GU negative Renal ROS  negative genitourinary   Musculoskeletal  (+) Arthritis , Osteoarthritis,    Abdominal (+)  Abdomen: soft.    Peds  Hematology  (+) REFUSES BLOOD PRODUCTS,   Anesthesia Other Findings   Reproductive/Obstetrics                            Anesthesia Physical Anesthesia Plan  ASA: 3  Anesthesia Plan: MAC, Regional and Spinal   Post-op Pain Management:  Regional for Post-op pain   Induction: Intravenous  PONV Risk Score and Plan: 1 and Ondansetron, Dexamethasone, Propofol infusion and Treatment may vary due to age or medical condition  Airway Management Planned: Simple Face Mask, Natural Airway and Nasal Cannula  Additional Equipment: None  Intra-op Plan:   Post-operative Plan:   Informed Consent: I have reviewed the patients History and Physical, chart, labs and discussed the procedure including the risks, benefits and alternatives for the proposed anesthesia with the patient or authorized representative who has indicated his/her understanding and acceptance.     Dental advisory given and Interpreter used for interveiw  Plan Discussed with: CRNA  Anesthesia Plan Comments: (Lab Results      Component                Value               Date                      WBC                       6.3                 07/28/2021                HGB                      16.0                07/28/2021                HCT                      46.3                07/28/2021                MCV                      88.4                07/28/2021                PLT  197                 07/28/2021           Lab Results      Component                Value               Date                      NA                       143                 07/28/2021                K                        3.9                 07/28/2021                CO2                      27                  07/28/2021                GLUCOSE                  152 (H)             07/28/2021                BUN                      21                  07/28/2021                CREATININE               0.85                07/28/2021                CALCIUM                  9.8                 07/28/2021                GFRNONAA                 >60                 07/28/2021          )        Anesthesia Quick Evaluation

## 2021-08-07 NOTE — Interval H&P Note (Signed)
History and Physical Interval Note: The patient is here today for a left knee replacement to treat his left hip osteoarthritis.  There is been no acute or interval change in his medical status.  Please see recent H&P.  The risks and benefits of surgery been explained in detail and informed consent is obtained.  The left knee has been marked.  08/07/2021 10:06 AM  Samuel [name]  has presented today for surgery, with the diagnosis of osteoarthritis left knee.  The various methods of treatment have been discussed with the patient and family. After consideration of risks, benefits and other options for treatment, the patient has consented to  Procedure(s): LEFT TOTAL KNEE ARTHROPLASTY (Left) as a surgical intervention.  The patient's history has been reviewed, patient examined, no change in status, stable for surgery.  I have reviewed the patient's chart and labs.  Questions were answered to the patient's satisfaction.     Kathryne Hitch

## 2021-08-07 NOTE — Evaluation (Signed)
Physical Therapy Evaluation Patient Details Name: Samuel [name] MRN: [id number] DOB: May 19, 1951 Today's Date: 08/07/2021  History of Present Illness  Patient is 70 y.o. male s/p Lt TKA on 08/07/21 with PMH significant for ETOH abuse, HTN, DM, back surgery in 2010.  Clinical Impression  Samuel [name] is a 71 y.o. male POD 0 s/p Lt TKA. Patient reports independence with mobility at baseline. Patient is now limited by functional impairments (see PT problem list below) and requires min assist for transfers and gait with RW. Patient was able to ambulate ~15 feet with RW and min assist. Patient instructed in exercise to facilitate circulation to manage edema and reduce risk of DVT. Patient will benefit from continued skilled PT interventions to address impairments and progress towards PLOF. Acute PT will follow to progress mobility and stair training in preparation for safe discharge home.        Recommendations for follow up therapy are one component of a multi-disciplinary discharge planning process, led by the attending physician.  Recommendations may be updated based on patient status, additional functional criteria and insurance authorization.  Follow Up Recommendations Follow surgeon's recommendation for DC plan and follow-up therapies;Home health PT    Equipment Recommendations  Rolling walker with 5" wheels    Recommendations for Other Services       Precautions / Restrictions Precautions Precautions: Fall Restrictions Weight Bearing Restrictions: No Other Position/Activity Restrictions: WBAT      Mobility  Bed Mobility Overal bed mobility: Needs Assistance Bed Mobility: Supine to Sit     Supine to sit: Min assist;HOB elevated          Transfers Overall transfer level: Needs assistance Equipment used: Rolling walker (2 wheeled) Transfers: Sit to/from Stand Sit to Stand: Min assist         General transfer comment: cues for hand placemet for power  up  Ambulation/Gait Ambulation/Gait assistance: Min assist Gait Distance (Feet): 15 Feet Assistive device: Rolling walker (2 wheeled) Gait Pattern/deviations: Step-to pattern;Decreased stride length;Decreased weight shift to left Gait velocity: decr   General Gait Details: cues for step to pattern, no overt LOB noted and pt  Stairs            Wheelchair Mobility    Modified Rankin (Stroke Patients Only)       Balance Overall balance assessment: Needs assistance Sitting-balance support: Feet supported Sitting balance-Leahy Scale: Good     Standing balance support: During functional activity;Bilateral upper extremity supported Standing balance-Leahy Scale: Fair                               Pertinent Vitals/Pain Pain Assessment: 0-10 Pain Score: 5  Pain Location: Lt knee Pain Descriptors / Indicators: Aching;Discomfort;Sore Pain Intervention(s): Limited activity within patient's tolerance;Monitored during session;Repositioned    Home Living Family/patient expects to be discharged to:: Private residence Living Arrangements: Spouse/significant other;Children Available Help at Discharge: Family Type of Home: House Home Access: Level entry     Home Layout: One level Home Equipment: None      Prior Function Level of Independence: Independent               Hand Dominance   Dominant Hand: Right    Extremity/Trunk Assessment   Upper Extremity Assessment Upper Extremity Assessment: Overall WFL for tasks assessed    Lower Extremity Assessment Lower Extremity Assessment: LLE deficits/detail LLE Deficits / Details: good quad activation, no extensor lag with SLR LLE Sensation:  WNL LLE Coordination: WNL    Cervical / Trunk Assessment Cervical / Trunk Assessment: Normal  Communication   Communication: Prefers language other than [place];Interpreter utilized  Cognition Arousal/Alertness: Awake/alert Behavior During Therapy: WFL for tasks  assessed/performed Overall Cognitive Status: Within Functional Limits for tasks assessed                                        General Comments      Exercises Total Joint Exercises Ankle Circles/Pumps: AROM;Both;20 reps;Seated   Assessment/Plan    PT Assessment Patient needs continued PT services  PT Problem List Decreased strength;Decreased range of motion;Decreased activity tolerance;Decreased balance;Decreased mobility;Decreased knowledge of use of DME;Decreased knowledge of precautions;Pain       PT Treatment Interventions DME instruction;Gait training;Stair training;Functional mobility training;Therapeutic activities;Therapeutic exercise;Balance training;Patient/family education    PT Goals (Current goals can be found in the Care Plan section)  Acute Rehab PT Goals Patient Stated Goal: to get independent and not hurt so badly PT Goal Formulation: With patient Time For Goal Achievement: 08/14/21 Potential to Achieve Goals: Good    Frequency 7X/week   Barriers to discharge        Co-evaluation               AM-PAC PT "6 Clicks" Mobility  Outcome Measure Help needed turning from your back to your side while in a flat bed without using bedrails?: A Little Help needed moving from lying on your back to sitting on the side of a flat bed without using bedrails?: A Little Help needed moving to and from a bed to a chair (including a wheelchair)?: A Little Help needed standing up from a chair using your arms (e.g., wheelchair or bedside chair)?: A Little Help needed to walk in hospital room?: A Little Help needed climbing 3-5 steps with a railing? : A Little 6 Click Score: 18    End of Session Equipment Utilized During Treatment: Gait belt Activity Tolerance: Patient tolerated treatment well Patient left: in chair;with call bell/phone within reach;with chair alarm set;with family/visitor present Nurse Communication: Mobility status PT Visit  Diagnosis: Muscle weakness (generalized) (M62.81);Difficulty in walking, not elsewhere classified (R26.2)    Time: 3086-5784 PT Time Calculation (min) (ACUTE ONLY): 26 min   Charges:   PT Evaluation $PT Eval Low Complexity: 1 Low PT Treatments $Gait Training: 8-22 mins        Wynn Maudlin, DPT Acute Rehabilitation Services Office [phone] Pager [phone]   Anitra Lauth 08/07/2021, 5:50 PM

## 2021-08-07 NOTE — Plan of Care (Signed)
  Problem: Clinical Measurements: Goal: Ability to maintain clinical measurements within normal limits will improve Outcome: Progressing   Problem: Activity: Goal: Risk for activity intolerance will decrease Outcome: Progressing   Problem: Pain Managment: Goal: General experience of comfort will improve Outcome: Progressing   

## 2021-08-07 NOTE — Anesthesia Procedure Notes (Signed)
Procedure Name: MAC Date/Time: 08/07/2021 11:15 AM Performed by: Lissa Morales, CRNA Pre-anesthesia Checklist: Patient identified, Emergency Drugs available and Suction available Oxygen Delivery Method: Simple face mask Preoxygenation: Pre-oxygenation with 100% oxygen Placement Confirmation: positive ETCO2

## 2021-08-08 ENCOUNTER — Encounter (HOSPITAL_COMMUNITY): Payer: Self-pay | Admitting: Orthopaedic Surgery

## 2021-08-08 DIAGNOSIS — M1712 Unilateral primary osteoarthritis, left knee: Secondary | ICD-10-CM | POA: Diagnosis not present

## 2021-08-08 LAB — CBC
HCT: 40.3 % (ref 39.0–52.0)
Hemoglobin: 13.7 g/dL (ref 13.0–17.0)
MCH: 29.9 pg (ref 26.0–34.0)
MCHC: 34 g/dL (ref 30.0–36.0)
MCV: 88 fL (ref 80.0–100.0)
Platelets: 195 10*3/uL (ref 150–400)
RBC: 4.58 MIL/uL (ref 4.22–5.81)
RDW: 13.2 % (ref 11.5–15.5)
WBC: 9.7 10*3/uL (ref 4.0–10.5)
nRBC: 0 % (ref 0.0–0.2)

## 2021-08-08 LAB — GLUCOSE, CAPILLARY
Glucose-Capillary: 179 mg/dL — ABNORMAL HIGH (ref 70–99)
Glucose-Capillary: 145 mg/dL — ABNORMAL HIGH (ref 70–99)

## 2021-08-08 LAB — BASIC METABOLIC PANEL
Anion gap: 8 (ref 5–15)
BUN: 27 mg/dL — ABNORMAL HIGH (ref 8–23)
CO2: 27 mmol/L (ref 22–32)
Calcium: 8.8 mg/dL — ABNORMAL LOW (ref 8.9–10.3)
Chloride: 101 mmol/L (ref 98–111)
Creatinine, Ser: 0.83 mg/dL (ref 0.61–1.24)
GFR, Estimated: >60 mL/min (ref >60)
Glucose, Bld: 182 mg/dL — ABNORMAL HIGH (ref 70–99)
Potassium: 3.9 mmol/L (ref 3.5–5.1)
Sodium: 136 mmol/L (ref 135–145)

## 2021-08-08 MED ORDER — OXYCODONE HCL 5 MG PO TABS: 5.0000 mg | ORAL_TABLET | ORAL | 0 refills | Status: DC | PRN

## 2021-08-08 MED ORDER — ASPIRIN 81 MG PO CHEW: 81.0000 mg | CHEWABLE_TABLET | Freq: Two times a day (BID) | ORAL | 0 refills | Status: DC

## 2021-08-08 MED ORDER — METHOCARBAMOL 500 MG PO TABS: 500.0000 mg | ORAL_TABLET | Freq: Four times a day (QID) | ORAL | 1 refills | Status: DC | PRN

## 2021-08-08 NOTE — Discharge Instructions (Signed)

## 2021-08-08 NOTE — Plan of Care (Signed)
  Problem: Clinical Measurements: Goal: Respiratory complications will improve Outcome: Progressing   Problem: Clinical Measurements: Goal: Cardiovascular complication will be avoided Outcome: Progressing   Problem: Elimination: Goal: Will not experience complications related to bowel motility Outcome: Progressing   Problem: Education: Goal: Knowledge of the prescribed therapeutic regimen will improve Outcome: Progressing

## 2021-08-08 NOTE — Progress Notes (Signed)
Pt stable at time of d/c instructions and education. Pt dressing remains clean, dry, and intact. Pt to d/c home with family.

## 2021-08-08 NOTE — Progress Notes (Signed)
Physical Therapy Treatment Patient Details Name: Samuel [name] MRN: [id number] DOB: [birth year] Today's Date: 08/08/2021   History of Present Illness Patient is 70 y.o. male s/p Lt TKA on 08/07/21 with PMH significant for ETOH abuse, HTN, DM, back surgery in 2010.    PT Comments    Patient making good progress with acute PT and ambulated increased distance of ~130' with min guard/supervision for for gait with guarding provided by pt's daughter. No LOB noted and pt maintained safe management of RW with minimal cues. Completed toilet transfer with no difficulty using grab bar. Reviewed HEP for ROM and circulation and addressed all questions in preparation for safe discharge home. Pt is ready to return home with assist from family. Acute PT will continue to progress mobility as able throughout stay.     Recommendations for follow up therapy are one component of a multi-disciplinary discharge planning process, led by the attending physician.  Recommendations may be updated based on patient status, additional functional criteria and insurance authorization.  Follow Up Recommendations  Follow surgeon's recommendation for DC plan and follow-up therapies;Home health PT     Equipment Recommendations  Rolling walker with 5" wheels    Recommendations for Other Services       Precautions / Restrictions Precautions Precautions: Fall Restrictions Weight Bearing Restrictions: No LLE Weight Bearing: Weight bearing as tolerated Other Position/Activity Restrictions: WBAT     Mobility  Bed Mobility Overal bed mobility: Needs Assistance Bed Mobility: Supine to Sit     Supine to sit: HOB elevated;Min guard;Supervision     General bed mobility comments: pt taking extra time to sit up to EOB, using UE's to bring Lt LE off edge. no assist needed, guarding for safety.    Transfers Overall transfer level: Needs assistance Equipment used: Rolling walker (2 wheeled) Transfers: Sit to/from  Stand Sit to Stand: Min guard;Supervision         General transfer comment: guarding/supervision for safety with rise from EOB, recliner, and toilet. pt demonstrated good carryover for hand placement with power up and min cues needed to extend Lt LE with sitting.  Ambulation/Gait Ambulation/Gait assistance: Min guard;Supervision Gait Distance (Feet): 130 Feet Assistive device: Rolling walker (2 wheeled) Gait Pattern/deviations: Step-to pattern;Decreased stride length;Decreased weight shift to left Gait velocity: decr   General Gait Details: min guard for safety and pt demonstrated good carryover for safe step to pattern and proximity to RW. pt's daughter provided safe guarding during gait with supervision/cues from therapist.   Stairs             Wheelchair Mobility    Modified Rankin (Stroke Patients Only)       Balance Overall balance assessment: Needs assistance Sitting-balance support: Feet supported Sitting balance-Leahy Scale: Good     Standing balance support: During functional activity;Bilateral upper extremity supported Standing balance-Leahy Scale: Fair                              Cognition Arousal/Alertness: Awake/alert Behavior During Therapy: WFL for tasks assessed/performed Overall Cognitive Status: Within Functional Limits for tasks assessed                                        Exercises Total Joint Exercises Ankle Circles/Pumps: AROM;Both;20 reps;Seated Quad Sets: AROM;Left;5 reps;Seated Short Arc Quad: AROM;Left;5 reps;Seated Heel Slides: AROM;Left;5 reps;Seated Hip ABduction/ADduction: AROM;Left;5 reps;Seated  Straight Leg Raises: AROM;Left;Seated;Other reps (comment)    General Comments        Pertinent Vitals/Pain Pain Assessment: 0-10 Pain Score: 8  Pain Location: Lt knee Pain Descriptors / Indicators: Aching;Discomfort;Sore Pain Intervention(s): Limited activity within patient's tolerance;Monitored  during session;Repositioned;Patient requesting pain meds-RN notified;Ice applied    Home Living                      Prior Function            PT Goals (current goals can now be found in the care plan section) Acute Rehab PT Goals Patient Stated Goal: to get independent and not hurt so badly PT Goal Formulation: With patient Time For Goal Achievement: 08/14/21 Potential to Achieve Goals: Good Progress towards PT goals: Progressing toward goals    Frequency    7X/week      PT Plan Current plan remains appropriate    Co-evaluation              AM-PAC PT "6 Clicks" Mobility   Outcome Measure  Help needed turning from your back to your side while in a flat bed without using bedrails?: A Little Help needed moving from lying on your back to sitting on the side of a flat bed without using bedrails?: A Little Help needed moving to and from a bed to a chair (including a wheelchair)?: A Little Help needed standing up from a chair using your arms (e.g., wheelchair or bedside chair)?: A Little Help needed to walk in hospital room?: A Little Help needed climbing 3-5 steps with a railing? : A Little 6 Click Score: 18    End of Session Equipment Utilized During Treatment: Gait belt Activity Tolerance: Patient tolerated treatment well Patient left: in chair;with call bell/phone within reach;with chair alarm set;with family/visitor present Nurse Communication: Mobility status PT Visit Diagnosis: Muscle weakness (generalized) (M62.81);Difficulty in walking, not elsewhere classified (R26.2)     Time: 3825-0539 PT Time Calculation (min) (ACUTE ONLY): 56 min  Charges:  $Gait Training: 23-37 mins $Therapeutic Exercise: 8-22 mins $Therapeutic Activity: 8-22 mins                     Wynn Maudlin, DPT Acute Rehabilitation Services Office [phone] Pager [phone]    Anitra Lauth 08/08/2021, 12:05 PM

## 2021-08-08 NOTE — Plan of Care (Signed)
Pt stable at this time. Pt to d/c home with family when RN is ready. Pt dressing remains clean, dry, and intact.

## 2021-08-08 NOTE — Plan of Care (Signed)
  Problem: Coping: Goal: Level of anxiety will decrease Outcome: Progressing   Problem: Pain Managment: Goal: General experience of comfort will improve Outcome: Progressing   

## 2021-08-08 NOTE — TOC Transition Note (Signed)
Transition of Care [facility]) - CM/SW Discharge Note   Patient Details  Name: AARIV MEDLOCK MRN: [id number] Date of Birth: 06/20/1951  [facility] of [facility]) CM/SW Contact:  Lennart Pall, LCSW Phone Number: 08/08/2021, 10:45 AM   Clinical Narrative:    Met with pt/ family and confirmed need for rw and 3n1.  No agency preference. Order placed with [facility]. Pt with HHPT prearranged with [facility] by ortho MD office.  No further needs.   Final next level of care: [place] Barriers to Discharge: No Barriers Identified   Patient Goals and CMS Choice Patient states their goals for this hospitalization and ongoing recovery are:: return home      Discharge Placement                       Discharge Plan and Services                DME Arranged: 3-N-1, Walker rolling DME Agency: [employer] Date DME Agency Contacted: 08/08/21 Time DME Agency Contacted: [phone] Representative spoke with at DME Agency: [place]: PT [place]: [place]        Social Determinants of Health (SDOH) Interventions     Readmission Risk Interventions No flowsheet data found.

## 2021-08-08 NOTE — Progress Notes (Signed)
Subjective: 1 Day Post-Op Procedure(s) (LRB): LEFT TOTAL KNEE ARTHROPLASTY (Left) Patient reports pain as moderate.    Objective: Vital signs in last 24 hours: Temp:  [97.6 F (36.4 C)-98.8 F (37.1 C)] 98 F (36.7 C) (10/01 0506) Pulse Rate:  [66-86] 70 (10/01 0506) Resp:  [10-18] 12 (10/01 0506) BP: (101-150)/(66-87) 125/72 (10/01 0506) SpO2:  [94 %-100 %] 98 % (10/01 0506)  Intake/Output from previous day: 09/30 0701 - 10/01 0700 In: 3552.5 [P.O.:660; I.V.:2892.5] Out: 3650 [Urine:3550; Blood:100] Intake/Output this shift: Total I/O In: 650 [P.O.:650] Out: -   Recent Labs    08/08/21 0340  HGB 13.7   Recent Labs    08/08/21 0340  WBC 9.7  RBC 4.58  HCT 40.3  PLT 195   Recent Labs    08/08/21 0340  NA 136  K 3.9  CL 101  CO2 27  BUN 27*  CREATININE 0.83  GLUCOSE 182*  CALCIUM 8.8*   No results for input(s): LABPT, INR in the last 72 hours.  Sensation intact distally Intact pulses distally Dorsiflexion/Plantar flexion intact Incision: dressing C/D/I No cellulitis present Compartment soft   Assessment/Plan: 1 Day Post-Op Procedure(s) (LRB): LEFT TOTAL KNEE ARTHROPLASTY (Left) Up with therapy Discharge home with home health      Samuel [name] 08/08/2021, 11:20 AM

## 2021-08-08 NOTE — Op Note (Signed)
NAMEWILMONT, [name]: [id number] ACCOUNT NO: [id number] DATE OF BIRTH: [birth year] FACILITY: [facility] LOCATION: [id number] PHYSICIAN: Vanita Panda. Magnus Ivan, MD  Operative Report   DATE OF PROCEDURE: 08/07/2021  PREOPERATIVE DIAGNOSES:  Primary osteoarthritis and degenerative joint disease, left knee.  POSTOPERATIVE DIAGNOSES:  Primary osteoarthritis and degenerative joint disease, left knee.  PROCEDURE:  Left total knee arthroplasty.  IMPLANTS:  Stryker Triathlon press-fit knee system with size 5 femur, size 5 tibial tray, 11 mm fixed bearing polyethylene insert, size 32 patellar button.  SURGEON:  Vanita Panda. Magnus Ivan, MD  ASSISTANT:  Richardean Canal, PA-C  ANESTHESIA: 1.  Left lower extremity adductor canal block. 2.  Spinal.  ANTIBIOTICS:  2 g IV Ancef.  TOURNIQUET TIME:  Less than 1 hour.  BLOOD LOSS:  100 mL.  COMPLICATIONS:  None.  INDICATIONS:  The patient is a 70 year old gentleman with debilitating arthritis and pain involving his left knee.  He has tried and failed all forms of conservative treatment. At this point his left knee pain is daily and it is detrimentally affecting  his mobility, his quality of life and his activities of daily living.  His x-rays do show osteoarthritis of the knee and at this point, having tried and failed all forms of conservative treatment, he does wish to proceed with a left total knee  arthroplasty and we agreed to this and we recommended it as well.  We talked in length and detail about the risk of acute blood loss anemia, nerve and vessel injury, infection, DVT, implant failure and skin and soft tissue issues.  We talked about our  goals being decrease pain, improve mobility and overall improve quality of life.  DESCRIPTION OF PROCEDURE:  After informed consent was obtained, appropriate left knee was marked.  An adductor canal block was obtained in the left lower extremity in the holding room.  He was then brought to the  operating room and sat up on the  operating table where spinal anesthesia was obtained, he was laid in supine position on the operating table.  Foley catheter was placed and a nonsterile tourniquet was placed around his upper left thigh.  His left thigh, knee, leg, and ankle were prepped  and draped with DuraPrep and sterile drapes including a sterile stockinette.  A timeout was called and he was identified correct patient, correct left knee.  We then used Esmarch to wrap that leg and tourniquet was inflated to 300 mm of pressure.  We  then made a direct midline incision over the patella and carried this proximally and distally.  We dissected the knee joint, carried out a medial parapatellar arthrotomy, finding a moderate joint effusion and significant osteophytes in all 3 compartments  of the knee as well as significant cartilage wear on the medial compartment and the patellofemoral joint. With the knee in a flexed position, we removed remnants of ACL, PCL, medial and lateral meniscus.  We then set our extramedullary cutting guide for  making our proximal tibia cut for taking 9 mm off the high side and correcting for varus and valgus and a neutral slope.  We made this cut without difficulty.  We then went into the femur and used an intramedullary guide for making our distal femoral  cut, setting this for a left knee at 5 degrees externally rotated and 8 mm distal femoral cut.  We made that cut without difficulty and brought the knee back down to full extension. With a 9 mm extension block, we had  achieved full extension.  We then  went back to the femur and used the femoral sizing guide based off the epicondylar axis for a left knee at 3 degrees and we chose the size 5 femur.  We put a 4-in-1 cutting block for a size 5 femur, made our anterior and posterior cuts, followed by our  chamfer cuts.  We then made our femoral box cut.  Of note, we did feel his bone quality was excellent and he was a good candidate  for press-fit implants.  We then chose a size 5 tibial tray for coverage, setting the rotation off the tibial tubercle and  the femur.  We did our keel punch off of this.  With a size 5 trial tibia, we trialed a 5 left femur and a 9 mm and then 11 mm polyethylene insert.  We were pleased with stability and range of motion of the 11 mm thickness insert.  We then made our  patellar cut and drilled three holes for a size 32 press-fit patellar button and again put his knee through range of motion with all trial instrumentation in the knee and we were pleased with range of motion and stability.  We then removed all  instrumentation from the knee and all trial components.  We irrigated the knee with normal saline solution using pulsatile lavage.  We then dried the knee real well.  With the knee in a flexed position, we then placed our real Stryker Triathlon press-fit  tibial tray size 5 followed by our real 5 press-fit femur.  We placed our 11 mm fixed bearing polyethylene insert and press-fit our size 32 patellar button.  I put him through several cycles of range of motion and then was pleased again with range of  motion and stability.  We then let the tourniquet down and hemostasis was obtained with electrocautery.  The arthrotomy was closed with interrupted #1 Vicryl suture followed by 0 Vicryl to close the deep tissue and 2-0 Vicryl to close the subcutaneous  tissue.  The skin was reapproximated with staples.  Xeroform well-padded sterile dressing was applied.  He was taken to recovery room in stable condition with all final counts being correct.  No complications noted.  Of note, Rexene Edison, PA-C, did assist  during the entire case and his assistance was crucial for facilitating all aspects of this case.   SHW D: 08/07/2021 12:39:07 pm T: 08/08/2021 12:34:00 am  JOB: [id number]/ [id number]

## 2021-08-08 NOTE — Discharge Summary (Signed)
Patient ID: Samuel [name] MRN: [id number] DOB/AGE: 1951/03/18 70 y.o.  Admit date: 08/07/2021 Discharge date: 08/08/2021  Admission Diagnoses:  Principal Problem:   Unilateral primary osteoarthritis, left knee Active Problems:   Status post left knee replacement   Discharge Diagnoses:  Same  Past Medical History:  Diagnosis Date   Blood transfusion without reported diagnosis    Diabetes mellitus without complication (HCC)    type II   History of ETOH abuse    Hypertension     Surgeries: Procedure(s): LEFT TOTAL KNEE ARTHROPLASTY on 08/07/2021   Consultants:   Discharged Condition: Improved  Hospital Course: Samuel [name] is an 70 y.o. male who was admitted 08/07/2021 for operative treatment ofUnilateral primary osteoarthritis, left knee. Patient has severe unremitting pain that affects sleep, daily activities, and work/hobbies. After pre-op clearance the patient was taken to the operating room on 08/07/2021 and underwent  Procedure(s): LEFT TOTAL KNEE ARTHROPLASTY.    Patient was given perioperative antibiotics:  Anti-infectives (From admission, onward)    Start     Dose/Rate Route Frequency Ordered Stop   08/07/21 1700  ceFAZolin (ANCEF) IVPB 1 g/50 mL premix        1 g 100 mL/hr over 30 Minutes Intravenous Every 6 hours 08/07/21 1523 08/07/21 2233   08/07/21 0845  ceFAZolin (ANCEF) IVPB 2g/100 mL premix        2 g 200 mL/hr over 30 Minutes Intravenous On call to O.R. 08/07/21 9629 08/07/21 1115        Patient was given sequential compression devices, early ambulation, and chemoprophylaxis to prevent DVT.  Patient benefited maximally from hospital stay and there were no complications.    Recent vital signs: Patient Vitals for the past 24 hrs:  BP Temp Temp src Pulse Resp SpO2  08/08/21 0506 125/72 98 F (36.7 C) Oral 70 12 98 %  08/08/21 0123 101/68 98 F (36.7 C) Oral 75 17 94 %  08/07/21 2036 116/73 97.8 F (36.6 C) Oral 86 18 96 %  08/07/21 1700 121/69  98.8 F (37.1 C) -- 82 18 95 %  08/07/21 1449 121/68 98 F (36.7 C) Oral 66 16 98 %  08/07/21 1415 (!) 149/76 -- -- 66 10 98 %  08/07/21 1400 138/71 97.6 F (36.4 C) -- 69 18 99 %  08/07/21 1345 (!) 145/66 -- -- 67 17 99 %  08/07/21 1330 (!) 150/87 -- -- 70 14 98 %  08/07/21 1315 (!) 150/81 -- -- 71 15 99 %  08/07/21 1310 -- -- -- 71 18 99 %  08/07/21 1305 -- -- -- 73 15 99 %  08/07/21 1300 123/79 97.6 F (36.4 C) -- 71 -- 100 %     Recent laboratory studies:  Recent Labs    08/08/21 0340  WBC 9.7  HGB 13.7  HCT 40.3  PLT 195  NA 136  K 3.9  CL 101  CO2 27  BUN 27*  CREATININE 0.83  GLUCOSE 182*  CALCIUM 8.8*     Discharge Medications:   Allergies as of 08/08/2021   No Known Allergies      Medication List     STOP taking these medications    aspirin EC 81 MG tablet Replaced by: aspirin 81 MG chewable tablet       TAKE these medications    aspirin 81 MG chewable tablet Chew 1 tablet (81 mg total) by mouth 2 (two) times daily. Replaces: aspirin EC 81 MG tablet   atorvastatin 40 MG  tablet Commonly known as: LIPITOR TAKE 1 TABLET BY MOUTH DAILY THEN TAKE 1/2 TABLET BY MOUTH AT BEDTIME What changed: See the new instructions.   FREESTYLE LITE test strip Generic drug: glucose blood USE AS INSTRUCTED FOUR TIMES DAILY   gabapentin 100 MG capsule Commonly known as: NEURONTIN TAKE 1 CAPSULE(100 MG) BY MOUTH THREE TIMES DAILY What changed: See the new instructions.   insulin lispro 100 UNIT/ML injection Commonly known as: HUMALOG INJECT UP TO 90 UNITS UNDER THE SKIN DAILY VIA INSULIN PUMP What changed: See the new instructions.   Insulin Pen Needle 32G X 4 MM Misc Use 5 pens per day to inject insulin   B-D UF III MINI PEN NEEDLES 31G X 5 MM Misc Generic drug: Insulin Pen Needle USE FIVE PER DAY AS DIRECTED   INSULIN SYRINGE .5CC/30GX5/16" 30G X 5/16" 0.5 ML Misc Use 3 times a day for insulin   losartan 50 MG tablet Commonly known as:  COZAAR TAKE 1 TABLET(50 MG) BY MOUTH DAILY What changed: See the new instructions.   methocarbamol 500 MG tablet Commonly known as: ROBAXIN Take 1 tablet (500 mg total) by mouth every 6 (six) hours as needed for muscle spasms.   NON FORMULARY Take 2 capsules by mouth daily. 4LIFE TRANSFER FACTORY CARDIO   Omnipod Classic Pods (Gen 3) Misc CHANGE EVERY 48 HOURS AS DIRECTED   OVER THE COUNTER MEDICATION Take 2 tablets by mouth daily. 4LIFE TRANSFER FACTORY RECALL   oxyCODONE 5 MG immediate release tablet Commonly known as: Oxy IR/ROXICODONE Take 1-2 tablets (5-10 mg total) by mouth every 4 (four) hours as needed for moderate pain (pain score 4-6).   Synjardy XR 03-999 MG Tb24 Generic drug: Empagliflozin-metFORMIN HCl ER TAKE 2 TABLETS BY MOUTH DAILY   traMADol 50 MG tablet Commonly known as: ULTRAM Take 1 tablet by mouth every 6 (six) hours as needed for pain.   Trulicity 1.5 MG/0.5ML Sopn Generic drug: Dulaglutide ADMINISTER 1.5 MG UNDER THE SKIN 1 TIME WEEKLY AS DIRECTED What changed: See the new instructions.               Durable Medical Equipment  (From admission, onward)           Start     Ordered   08/07/21 1524  DME 3 n 1  Once        08/07/21 1523   08/07/21 1524  DME Walker rolling  Once       Question Answer Comment  Walker: With 5 Inch Wheels   Patient needs a walker to treat with the following condition Status post left knee replacement      08/07/21 1523            Diagnostic Studies: DG Knee Left Port  Result Date: 08/07/2021 CLINICAL DATA:  Status post left knee arthroplasty EXAM: PORTABLE LEFT KNEE - 1-2 VIEW COMPARISON:  Left knee radiographs 05/14/2016 FINDINGS: Left total knee prosthesis is well seated without periprosthetic fracture or lucency. Subcutaneous emphysema skin staples consistent with immediate postop status. IMPRESSION: Uncomplicated left total knee prosthesis with immediate postop changes. Electronically Signed   By:  Acquanetta Belling M.D.   On: 08/07/2021 14:17    Disposition: Discharge disposition: 01-Home or Self Care          Follow-up Information     Kathryne Hitch, MD Follow up in 2 week(s).   Specialty: Orthopedic Surgery Contact information: [address] [place] [address] [phone]  [facility] Follow up.   Specialty: Home Health Services Why: to provide home health physical therapy Contact information: [address] [place] [address] [phone]                  Signed: Kathryne Hitch 08/08/2021, 11:22 AM

## 2021-08-13 ENCOUNTER — Telehealth: Payer: Self-pay

## 2021-08-13 NOTE — Telephone Encounter (Signed)
Transition Care Management Unsuccessful Follow-up Telephone Call  Date of discharge and from where:  [facility] 08/08/21  Attempts:  1st Attempt  Reason for unsuccessful TCM follow-up call:  No answer/busy   Jodelle Gross, RN, BSN, CCM Care Management Coordinator [facility] Phone: [phone] / Fax: [phone]

## 2021-08-14 ENCOUNTER — Telehealth: Payer: Self-pay | Admitting: Orthopaedic Surgery

## 2021-08-14 ENCOUNTER — Other Ambulatory Visit: Payer: Self-pay | Admitting: Orthopaedic Surgery

## 2021-08-14 ENCOUNTER — Telehealth: Payer: Self-pay

## 2021-08-14 MED ORDER — OXYCODONE HCL 5 MG PO TABS: 5.0000 mg | ORAL_TABLET | Freq: Four times a day (QID) | ORAL | 0 refills | Status: DC | PRN

## 2021-08-14 NOTE — Telephone Encounter (Signed)
Pt needs a refill on his oxycodone 5 mg rx please and he would like a CB when that's been sent.   [phone]

## 2021-08-14 NOTE — Telephone Encounter (Signed)
Transition Care Management Follow-up Telephone Call Date of discharge and from where: 08/08/2021  Samuel [name] How have you been since you were released from the hospital? Doing well per daughter. Has had several PT appointments.  Any questions or concerns? Yes Reports patient is running out of pain medications.  Encouraged daughter to call orthopedic sugeon.   Items Reviewed: Did the pt receive and understand the discharge instructions provided? Yes  Medications obtained and verified? Yes  Other? No  Any new allergies since your discharge? No  Dietary orders reviewed? Yes Do you have support at home? Yes   Home Care and Equipment/Supplies: Were home health services ordered? yes If so, what is the name of the agency?  Has the agency set up a time to come to the patient's home? yes Were any new equipment or medical supplies ordered?  YES What is the name of the medical supply agency?  Were you able to get the supplies/equipment? yes Do you have any questions related to the use of the equipment or supplies? No  Functional Questionnaire: (I = Independent and D = Dependent) ADLs: I  Bathing/Dressing- I  Meal Prep- I  Eating- I  Maintaining continence- I  Transferring/Ambulation- I  Managing Meds- I  Follow up appointments reviewed: PCP Hospital f/u appt confirmed? No   Specialist Hospital f/u appt confirmed?  Scheduled to see Dr. Rayburn Ma on 08/20/2021 @ 1445pm. Are transportation arrangements needed? No  If their condition worsens, is the pt aware to call PCP or go to the Emergency Dept.? Yes Was the patient provided with contact information for the PCP's office or ED? Yes Was to pt encouraged to call back with questions or concerns? Yes  Rowe Pavy, RN, BSN, CEN [facility] [phone]

## 2021-08-19 ENCOUNTER — Ambulatory Visit (INDEPENDENT_AMBULATORY_CARE_PROVIDER_SITE_OTHER): Payer: Medicare Other | Admitting: Physician Assistant

## 2021-08-19 ENCOUNTER — Other Ambulatory Visit: Payer: Self-pay | Admitting: Medical

## 2021-08-19 ENCOUNTER — Encounter: Payer: Self-pay | Admitting: Physician Assistant

## 2021-08-19 DIAGNOSIS — Z96652 Presence of left artificial knee joint: Secondary | ICD-10-CM

## 2021-08-19 NOTE — Progress Notes (Signed)
HPI: Mr. Low is a pleasant 70 year old male comes in today 2-week status post left total knee arthroplasty.  The knee is overall doing well.  He does require an interpreter and the [employer] video interpreting is used to communicate with the patient as he speaks [place]..  Patient overall notes that the knee range of motion is doing well.  He is taking aspirin for DVT prophylaxis he was on an 81 mg aspirin prior to surgery.  He does not have outpatient therapy set up as of yet.  He has had no fevers chills shortness of breath chest pain or calf pain.  Physical exam: Left knee surgical incisions healing well.  No signs of infection.  Calf supple nontender.  He has full extension and flexion to 90 degrees.  Dorsiflexion plantarflexion ankle intact.  Impression: Status post left total knee arthroplasty 08/07/2021  Plan: We will have him start outpatient physical therapy work on range of motion strengthening the knee.  Staples were removed Steri-Strips applied.  He will work on scar tissue mobilization.  Did go back on his 81 mg aspirin.  Prescription for outpatient therapy was given.  We will see him back in 1 month sooner if there is any questions concerns.  Questions were encouraged and answered.

## 2021-08-20 ENCOUNTER — Encounter: Payer: Medicare Other | Admitting: Orthopaedic Surgery

## 2021-08-20 NOTE — Telephone Encounter (Signed)
Please deny auto refill , patient was prescribed medication back in 2015 by previous provider

## 2021-08-20 NOTE — Telephone Encounter (Signed)
Old prescription. Appears auto refill request?

## 2021-08-28 ENCOUNTER — Other Ambulatory Visit: Payer: Self-pay | Admitting: Medical

## 2021-08-30 ENCOUNTER — Other Ambulatory Visit: Payer: Self-pay | Admitting: Endocrinology

## 2021-09-02 ENCOUNTER — Telehealth: Payer: Self-pay | Admitting: Medical

## 2021-09-02 NOTE — Telephone Encounter (Signed)
[facility] is calling because they need a copy of the PT referral faxed over to them. It can be faxed to [phone]. Please advise.

## 2021-09-11 ENCOUNTER — Other Ambulatory Visit: Payer: Self-pay | Admitting: Medical

## 2021-09-11 NOTE — Telephone Encounter (Addendum)
Requesting: tramadol Contract: n/a UDS:n/a Last Visit:06/03/21 Next Visit:n/a Last Refill:03/11/21  Please Advise   Would you clarify whether the patient needs this.  I just do not note placed by bruits this states that the patient no longer needs this.

## 2021-09-11 NOTE — Telephone Encounter (Signed)
Pt daughter said he was requesting refill.    Medication: traMADol (ULTRAM) 50 MG tablet  Has the patient contacted their pharmacy? No.   Preferred Pharmacy (with phone number or street name): [id number] [place], [place] - [address] AT [facility] [address]  [address], [place] [address]  Phone:  [phone]  Fax:  [phone]

## 2021-09-14 ENCOUNTER — Other Ambulatory Visit: Payer: Self-pay | Admitting: Orthopaedic Surgery

## 2021-09-14 IMAGING — DX DG HIP (WITH OR WITHOUT PELVIS) 2-3V*L*
3 series · 3 of 3 positions shown · non-contrast
Comparison: None.

CLINICAL DATA: Left hip pain.

EXAM:
DG HIP (WITH OR WITHOUT PELVIS) 2-3V LEFT

[pelvis ap]
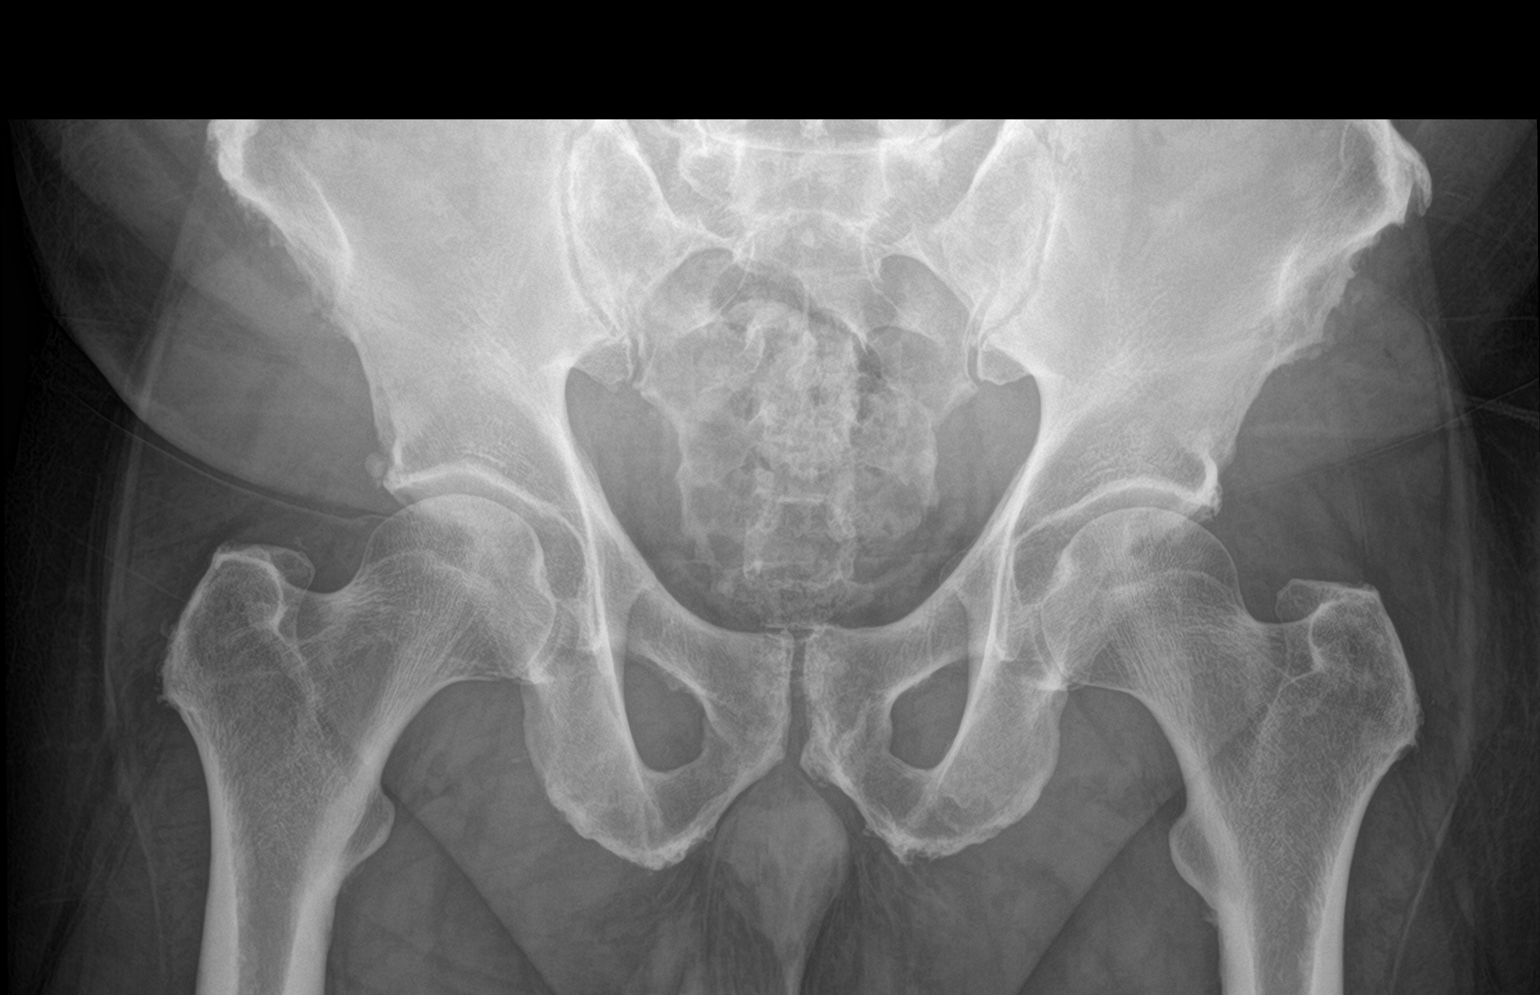

[hip ap]
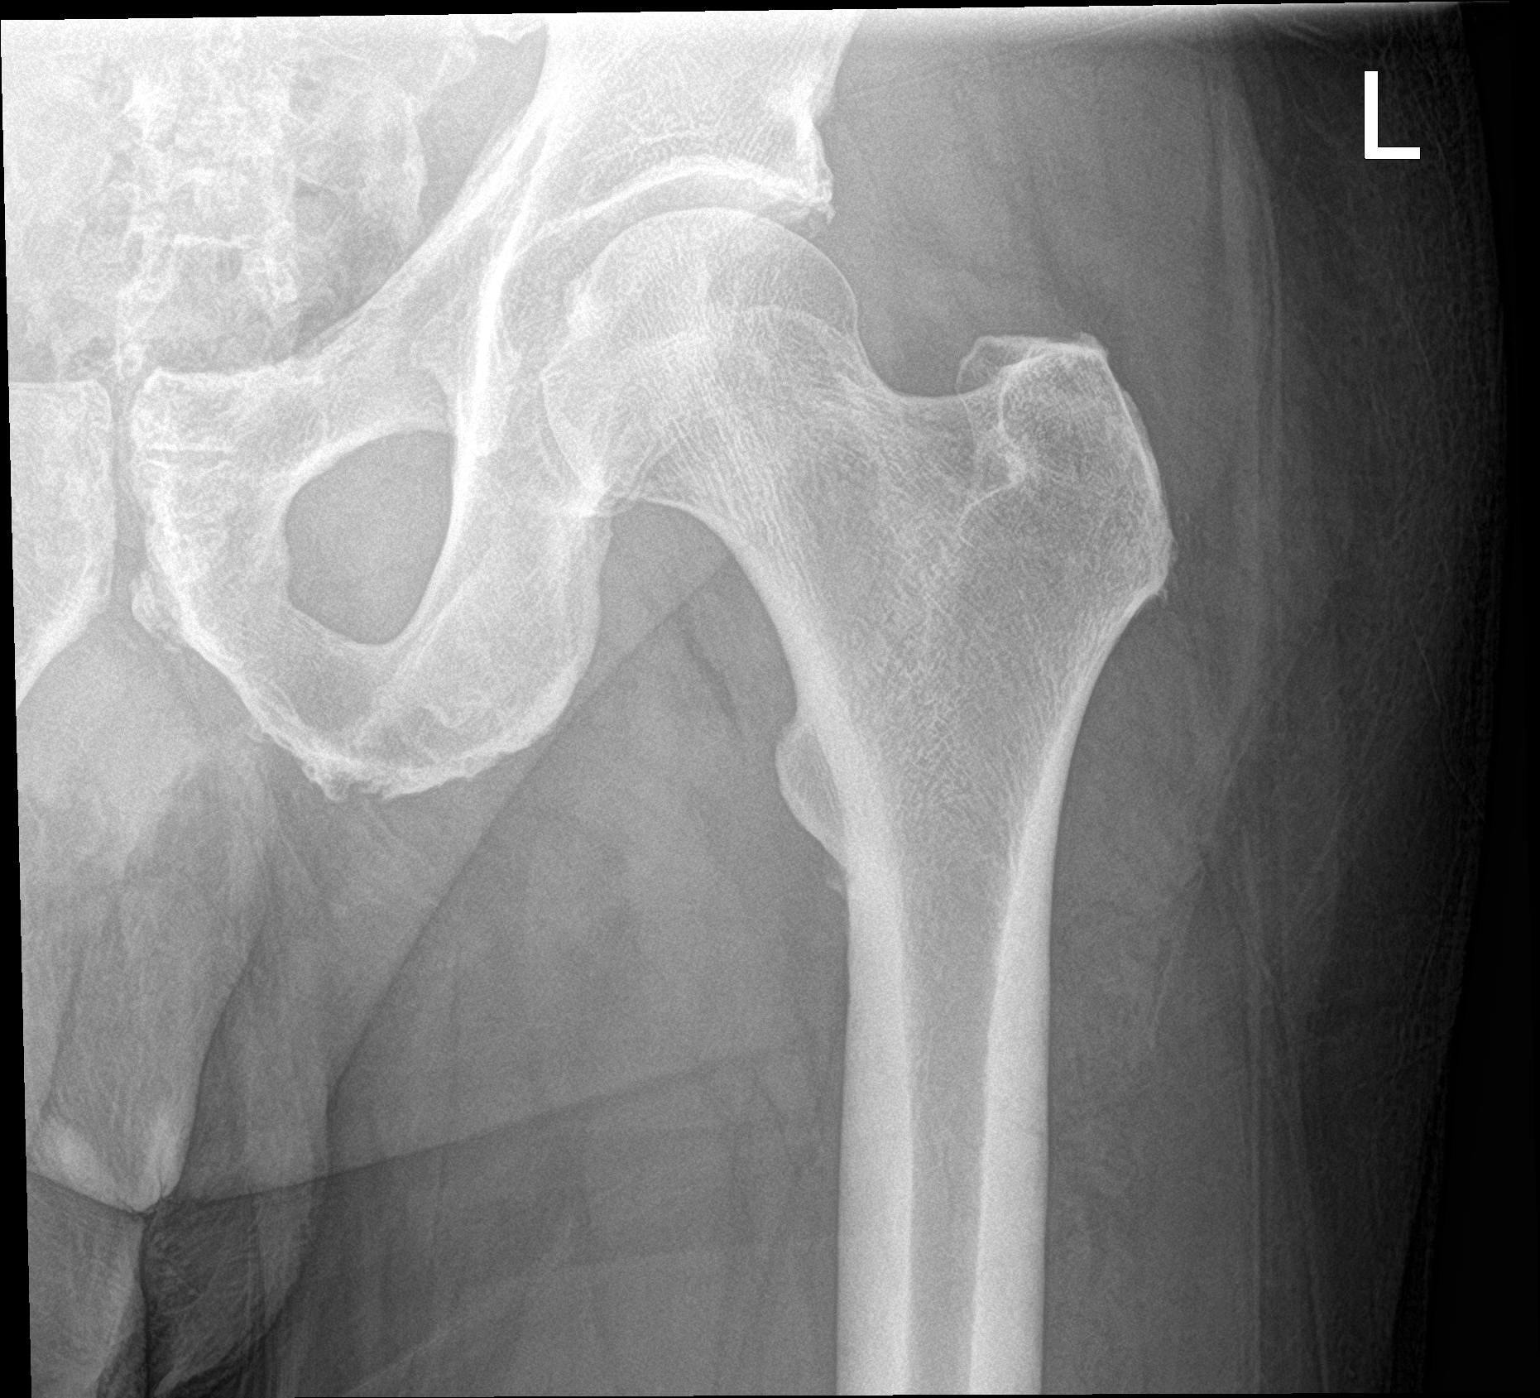

[hip lat]
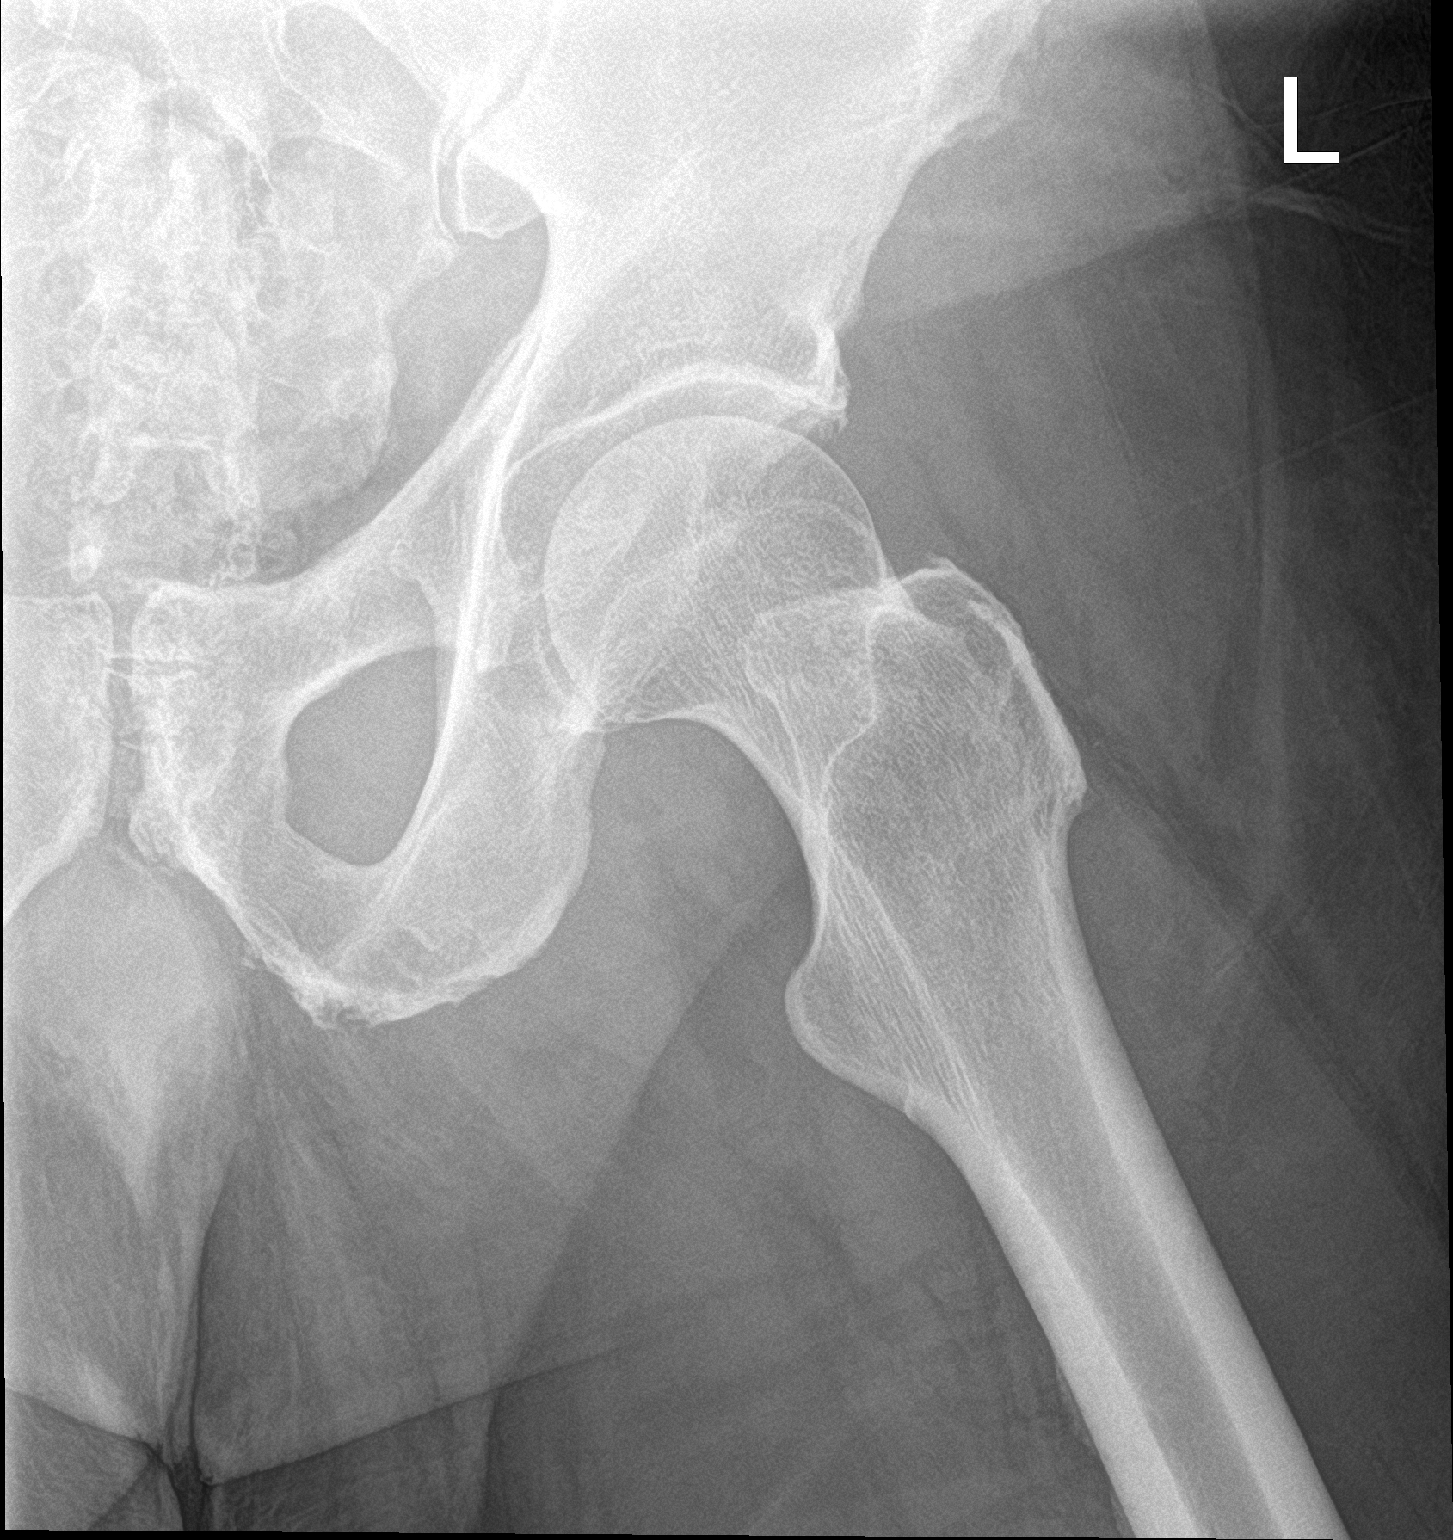

[3 of 3 positions shown; findings below may reference images not displayed]

FINDINGS: Mild degenerative changes are noted at the attachment sites of the
left iliac and ischium. No acute or healing fractures are present.
Left hip is located.
IMPRESSION: Mild degenerative changes.

## 2021-09-17 ENCOUNTER — Ambulatory Visit (INDEPENDENT_AMBULATORY_CARE_PROVIDER_SITE_OTHER): Payer: Medicare Other | Admitting: Physician Assistant

## 2021-09-17 ENCOUNTER — Other Ambulatory Visit: Payer: Self-pay

## 2021-09-17 ENCOUNTER — Encounter: Payer: Self-pay | Admitting: Physician Assistant

## 2021-09-17 DIAGNOSIS — Z96652 Presence of left artificial knee joint: Secondary | ICD-10-CM

## 2021-09-17 MED ORDER — HYDROCODONE-ACETAMINOPHEN 5-325 MG PO TABS: 1.0000 | ORAL_TABLET | Freq: Four times a day (QID) | ORAL | 0 refills | Status: DC | PRN

## 2021-09-17 NOTE — Progress Notes (Signed)
HPI: Samuel [name] returns today status post left total knee arthroplasty 08/07/2021.  He states he is doing well he states mainly having pain at night.  States Advil is not helping with this.  He continues to work with therapy on range of motion.  Otherwise he has no complaints.  Physical exam: Left knee surgical incisions well-healed.  Has full extension and flexion to 90 degrees.  Calf supple nontender.  Ambulating with a cane.  Impression: Status post left total knee arthroplasty 08/07/2021  Plan: Recommend that he work on flexion.  We will see him back in just 2 weeks to see how he is doing in regards to flexion he may require manipulation if he is unable to bend the knee past 90.  Discussed this with him today using interpreter.  Also refill his pain medicine today.  Questions were encouraged and answered at length.

## 2021-09-18 ENCOUNTER — Encounter: Payer: Self-pay | Admitting: Dietician

## 2021-09-18 ENCOUNTER — Encounter: Payer: Medicare Other | Attending: Endocrinology | Admitting: Dietician

## 2021-09-18 DIAGNOSIS — E119 Type 2 diabetes mellitus without complications: Secondary | ICD-10-CM | POA: Diagnosis not present

## 2021-09-18 NOTE — Progress Notes (Signed)
Diabetes Self-Management Education  Visit Type: First/Initial  Appt. Start Time: 1445 Appt. End Time: 1600  09/18/2021  Mr. Aragorn Recker, identified by name and date of birth, is a 70 y.o. male with a diagnosis of Diabetes: Type 2.   ASSESSMENT Patient is here today with his Spanish interpretor Cleotis Nipper D. With [facility] [employer]).  He does not know what he would like to learn but states that he came due to his MD's wishes.  Chart reviewed and Omnipod and Libre were downloaded.    Time in range:  89% and 0% lows He is not counting carbohydrates nor entering meal intake. He is guessing the amount of insulin to give based on his blood sugar and entering the units.   He checked his sensor reading here and found it to be 170 and entered a bolus of 14 units without my knowledge. Sensor reading decreased to 129 1 hour later.  Provided patient a snack with protein to eat now.  Provided juice and instructed him to drink half of the bottle if his blood sugar goes too low.  Patient is leaving to go to dinner.  Showed patient how to bolus - enter blood glucose and carbohydrate amounts. Instructed patient basic carb counting and provided a handout on meal planning in Spanish which discussed protein at each meal, label reading and basic meal planning. Meal plan card provided in Spanish. Reviewed carb counting using portion sizes. Emailed patient's daughter a youtube video showing how to bolus per patient's request as he is concerned about forgetting. He can read a little English, enough to use his pump.  States that he is good in math and was able to demonstrate this with carbohydrate counting.  History includes Type 2 Diabetes, HTN He uses a [place] day Stevenson and an Omnipod 3 Insulin Pump. A1C 7.3% 06/22/2021 and 7.1% 03/19/2021  Patient lives with his wife and daughters.  He does much of the shopping and cooking. He is retired and used to be Retail banker for  buildings.  Height 5\' 3"  (1.6 m), weight 197 lb (89.4 kg). Body mass index is 34.9 kg/m.   Diabetes Self-Management Education - 09/18/21 1631       Visit Information   Visit Type First/Initial      Initial Visit   Diabetes Type Type 2    Are you currently following a meal plan? No    Are you taking your medications as prescribed? Yes      Psychosocial Assessment   Self-management support Doctor's office;Family    Other persons present Patient;Interpreter    Patient Concerns Problem Solving;Other (comment)   pump education   Special Needs Simplified materials    Preferred Learning Style No preference indicated    Learning Readiness Ready    How often do you need to have someone help you when you read instructions, pamphlets, or other written materials from your doctor or pharmacy? 4 - Often    What is the last grade level you completed in school? 5th grade in 13/11/22      Pre-Education Assessment   Patient understands the diabetes disease and treatment process. Demonstrates understanding / competency    Patient understands incorporating nutritional management into lifestyle. Needs Review    Patient undertands incorporating physical activity into lifestyle. Demonstrates understanding / competency    Patient understands using medications safely. Needs Review    Patient understands monitoring blood glucose, interpreting and using results Demonstrates understanding / competency  Patient understands prevention, detection, and treatment of acute complications. Demonstrates understanding / competency    Patient understands prevention, detection, and treatment of chronic complications. Demonstrates understanding / competency    Patient understands how to develop strategies to address psychosocial issues. Demonstrates understanding / competency    Patient understands how to develop strategies to promote health/change behavior. Needs Review      Complications   How often do you  check your blood sugar? > 4 times/day    Fasting Blood glucose range (mg/dL) 38-250    Postprandial Blood glucose range (mg/dL) 539-767;>341    Number of hypoglycemic episodes per month 0    Number of hyperglycemic episodes per week 3      Dietary Intake   Dinner vegetables, chicken or beans, occasional rice, 1 tortilla      Patient Education   Previous Diabetes Education Yes (please comment)   2016 by myself and Pump training in 2019   Nutrition management  Carbohydrate counting    Medications Other (comment)   pump dosing     Individualized Goals (developed by patient)   Nutrition Other (comment)   carbohydrate counting   Monitoring  test my blood glucose as discussed    Reducing Risk examine blood glucose patterns    Health Coping discuss diabetes with (comment)   MD, RD, CDCES     Post-Education Assessment   Patient understands the diabetes disease and treatment process. Demonstrates understanding / competency    Patient understands incorporating nutritional management into lifestyle. Needs Review    Patient undertands incorporating physical activity into lifestyle. Demonstrates understanding / competency    Patient understands using medications safely. Demonstrates understanding / competency    Patient understands monitoring blood glucose, interpreting and using results Demonstrates understanding / competency    Patient understands prevention, detection, and treatment of acute complications. Demonstrates understanding / competency    Patient understands prevention, detection, and treatment of chronic complications. Demonstrates understanding / competency    Patient understands how to develop strategies to address psychosocial issues. Demonstrates understanding / competency    Patient understands how to develop strategies to promote health/change behavior. Demonstrates understanding / competency      Outcomes   Expected Outcomes Demonstrated interest in learning. Expect positive  outcomes    Future DMSE PRN    Program Status Completed             Individualized Plan for Diabetes Self-Management Training:   Learning Objective:  Patient will have a greater understanding of diabetes self-management. Patient education plan is to attend individual and/or group sessions per assessed needs and concerns.   Plan:   Patient Instructions  Begin bolusing before each meal   Select bolus Enter your current glucose using arrows Enter your carbohydrate amount that you will eat Be sure to select the total and confirm 5.   Enter 6.  Confirm   Expected Outcomes:  Demonstrated interest in learning. Expect positive outcomes  Education material provided: Meal plan card in Spanish, Meal planning including label reading in Spanish  If problems or questions, patient to contact team via:  Phone  Future DSME appointment: PRN

## 2021-09-18 NOTE — Patient Instructions (Signed)
Begin bolusing before each meal   Select bolus Enter your current glucose using arrows Enter your carbohydrate amount that you will eat Be sure to select the total and confirm 5.   Enter 6.  Confirm

## 2021-10-08 ENCOUNTER — Encounter: Payer: Self-pay | Admitting: Physician Assistant

## 2021-10-08 ENCOUNTER — Ambulatory Visit: Payer: Medicare Other | Admitting: Endocrinology

## 2021-10-08 ENCOUNTER — Ambulatory Visit (INDEPENDENT_AMBULATORY_CARE_PROVIDER_SITE_OTHER): Payer: Medicare Other | Admitting: Physician Assistant

## 2021-10-08 DIAGNOSIS — M1712 Unilateral primary osteoarthritis, left knee: Secondary | ICD-10-CM

## 2021-10-08 NOTE — Progress Notes (Signed)
  HPI: Mr. Cardin returns today for follow-up of his left total knee arthroplasty which was performed on 08/07/2021.  He is here with an interpreter today.  He states that overall the knee is doing well.  He has no significant pain some discomfort at night from time to time.  Has been going to therapy working on range of motion strengthening.  He has had no fevers chills.  Physical exam: Left knee full extension flexion to approximately 110 degrees.  No instability valgus varus stressing.  Surgical incisions well-healed no signs of infection or dehiscence.  Calf supple nontender.  Impression: Status post left total knee arthroplasty 08/07/2021  Plan: We will see him back at the 22-month mark at that time obtain an AP and lateral view of the left knee.  We will see him back sooner if there is any questions concerns.  Questions were encouraged and answered at length today.  He will continue scar tissue mobilization and continue to work on strengthening and range of motion of the knee in the interim.

## 2021-10-12 ENCOUNTER — Other Ambulatory Visit: Payer: Self-pay | Admitting: Medical

## 2021-10-20 ENCOUNTER — Other Ambulatory Visit: Payer: Self-pay | Admitting: Endocrinology

## 2021-10-25 ENCOUNTER — Other Ambulatory Visit: Payer: Self-pay | Admitting: Endocrinology

## 2021-10-30 ENCOUNTER — Other Ambulatory Visit: Payer: Self-pay | Admitting: Medical

## 2021-10-30 ENCOUNTER — Ambulatory Visit (INDEPENDENT_AMBULATORY_CARE_PROVIDER_SITE_OTHER): Payer: Medicare Other | Admitting: Medical

## 2021-10-30 VITALS — BP 108/70 | HR 77 | Resp 18 | Ht 63.0 in | Wt 191.0 lb

## 2021-10-30 DIAGNOSIS — I10 Essential (primary) hypertension: Secondary | ICD-10-CM

## 2021-10-30 DIAGNOSIS — R1032 Left lower quadrant pain: Secondary | ICD-10-CM

## 2021-10-30 DIAGNOSIS — E119 Type 2 diabetes mellitus without complications: Secondary | ICD-10-CM | POA: Diagnosis not present

## 2021-10-30 DIAGNOSIS — Z23 Encounter for immunization: Secondary | ICD-10-CM

## 2021-10-30 DIAGNOSIS — Z794 Long term (current) use of insulin: Secondary | ICD-10-CM

## 2021-10-30 DIAGNOSIS — E785 Hyperlipidemia, unspecified: Secondary | ICD-10-CM

## 2021-10-30 LAB — CBC WITH DIFFERENTIAL/PLATELET
Basophils Absolute: 0 10*3/uL (ref 0.0–0.1)
Basophils Relative: 0.6 % (ref 0.0–3.0)
Eosinophils Absolute: 0.2 10*3/uL (ref 0.0–0.7)
Eosinophils Relative: 3.5 % (ref 0.0–5.0)
HCT: 44.9 % (ref 39.0–52.0)
Hemoglobin: 14.8 g/dL (ref 13.0–17.0)
Lymphocytes Relative: 28.8 % (ref 12.0–46.0)
Lymphs Abs: 1.5 10*3/uL (ref 0.7–4.0)
MCHC: 33 g/dL (ref 30.0–36.0)
MCV: 85.5 fl (ref 78.0–100.0)
Monocytes Absolute: 0.5 10*3/uL (ref 0.1–1.0)
Monocytes Relative: 9 % (ref 3.0–12.0)
Neutro Abs: 3 10*3/uL (ref 1.4–7.7)
Neutrophils Relative %: 58.1 % (ref 43.0–77.0)
Platelets: 211 10*3/uL (ref 150.0–400.0)
RBC: 5.26 Mil/uL (ref 4.22–5.81)
RDW: 15.9 % — ABNORMAL HIGH (ref 11.5–15.5)
WBC: 5.2 10*3/uL (ref 4.0–10.5)

## 2021-10-30 LAB — COMPREHENSIVE METABOLIC PANEL
ALT: 19 U/L (ref 0–53)
AST: 18 U/L (ref 0–37)
Albumin: 4.6 g/dL (ref 3.5–5.2)
Alkaline Phosphatase: 54 U/L (ref 39–117)
BUN: 19 mg/dL (ref 6–23)
CO2: 30 mEq/L (ref 19–32)
Calcium: 9.5 mg/dL (ref 8.4–10.5)
Chloride: 101 mEq/L (ref 96–112)
Creatinine, Ser: 0.76 mg/dL (ref 0.40–1.50)
GFR: 91.18 mL/min (ref >60.00)
Glucose, Bld: 110 mg/dL — ABNORMAL HIGH (ref 70–99)
Potassium: 3.9 mEq/L (ref 3.5–5.1)
Sodium: 139 mEq/L (ref 135–145)
Total Bilirubin: 0.6 mg/dL (ref 0.2–1.2)
Total Protein: 7.2 g/dL (ref 6.0–8.3)

## 2021-10-30 LAB — LIPID PANEL
Cholesterol: 118 mg/dL (ref 0–200)
HDL: 45.6 mg/dL (ref >39.00)
LDL Cholesterol: 44 mg/dL (ref 0–99)
NonHDL: 72.03
Total CHOL/HDL Ratio: 3
Triglycerides: 138 mg/dL (ref 0.0–149.0)
VLDL: 27.6 mg/dL (ref 0.0–40.0)

## 2021-10-30 MED ORDER — CIPROFLOXACIN HCL 500 MG PO TABS: 500.0000 mg | ORAL_TABLET | Freq: Two times a day (BID) | ORAL | 0 refills | Status: DC

## 2021-10-30 MED ORDER — METRONIDAZOLE 500 MG PO TABS: 500.0000 mg | ORAL_TABLET | Freq: Three times a day (TID) | ORAL | 0 refills | Status: AC

## 2021-10-30 MED ORDER — FAMOTIDINE 20 MG PO TABS: 20.0000 mg | ORAL_TABLET | Freq: Every day | ORAL | 0 refills | Status: DC

## 2021-10-30 NOTE — Patient Instructions (Addendum)
One month of left lower quadrant pain. Approaching holidary weekend and office close on Monday. Will rx cipro and flagyl antibiotic. Treat for possible diverticulitis. If pain persists consider ct abd/pelvis. On review may be due for colonosocpy at I saw on report one polyp found in 2017. Referring to GI MD to evaluate area and to see if colonoscopy due.  For recent sour taste to mouth will rx famotadine. Sour taste could be reflux. See if this helps.  For high cholesterol continue statin and get lipid panel today with cmp.  For htn continue losrartan.   Follow up with Dr. Lucianne Muss for diabetes. Did place referral for diabetic eye exam  I think you left knee pain is coming along. Transient hip pain may be related to gait change but if constant pain let me know and will get hip xray.  Follow up in 10 days or sooner if needed.

## 2021-10-30 NOTE — Progress Notes (Signed)
Subjective:    Patient ID: Samuel [name], male    DOB: June 28, 1951, 70 y.o.   MRN: [id number]  HPI   Pt in for left knee pain. Pt had knee surgery 3 months ago. His pain is minimal and gradually decreasing. Pt states is doing PT. He rarely needs norco for pain. Will have appointment with ortho in April. Occasional rare and transient low level left hip pain.  Pt mentions has new left lower quadrant area pain for one month. Pt states pain is constant and low level. No nausea, no vomiting, no constipation or diarrhea. No fever.   Pt tells me has no epigastric pain. No reflux but at times sour taste in mouth.  He states has sour taste to his mouth at time.  Htn- on losartan 50 mg q day. When wife checks his bp is 20-130/80.  Hyperlipidemia- on atorvastatin 40 mg daily.  Diabetic pt- Dr. Lucianne Muss if following/treating his diabetes/   Review of Systems  Constitutional:  Negative for chills, fatigue and fever.  HENT:  Negative for congestion.   Respiratory:  Negative for cough, chest tightness, shortness of breath and wheezing.   Cardiovascular:  Negative for chest pain and palpitations.  Gastrointestinal:  Positive for abdominal pain. Negative for diarrhea, nausea and rectal pain.  Genitourinary:  Negative for dysuria, flank pain, frequency, penile pain and testicular pain.  Musculoskeletal:  Negative for back pain, joint swelling and neck stiffness.       Mild left hip and knee pain.    Past Medical History:  Diagnosis Date   Blood transfusion without reported diagnosis    Diabetes mellitus without complication (HCC)    type II   History of ETOH abuse    Hypertension      Social History   Socioeconomic History   Marital status: Married    Spouse name: Not on file   Number of children: Not on file   Years of education: Not on file   Highest education level: Not on file  Occupational History   Not on file  Tobacco Use   Smoking status: Former   Smokeless tobacco: Former     Quit date: 11/08/1993  Vaping Use   Vaping Use: Never used  Substance and Sexual Activity   Alcohol use: No   Drug use: No   Sexual activity: Not on file  Other Topics Concern   Not on file  Social History Narrative   Not on file   Social Determinants of Health   Financial Resource Strain: Not on file  Food Insecurity: Not on file  Transportation Needs: Not on file  Physical Activity: Not on file  Stress: Not on file  Social Connections: Not on file  Intimate Partner Violence: Not on file    Past Surgical History:  Procedure Laterality Date   BACK SURGERY N/A 2010   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   Procedure: LEFT TOTAL KNEE ARTHROPLASTY;  Surgeon: Kathryne Hitch, MD;  Location: [facility];  Service: Orthopedics;  Laterality: Left;    Family History  Problem Relation Age of Onset   Diabetes Unknown    Hypertension Unknown    Arthritis Father    Diabetes Father     No Known Allergies  Current Outpatient Medications on File Prior to Visit  Medication Sig Dispense Refill   aspirin 81 MG chewable tablet Chew 1 tablet (81 mg total) by mouth 2 (two) times daily. 60 tablet 0   atorvastatin (LIPITOR) 40 MG tablet  TAKE 1 TABLET BY MOUTH DAILY THEN TAKE 1/2 TABLET BY MOUTH AT BEDTIME (Patient taking differently: Take 40 mg by mouth daily.) 135 tablet 0   B-D UF III MINI PEN NEEDLES 31G X 5 MM MISC USE FIVE PER DAY AS DIRECTED 200 each 0   gabapentin (NEURONTIN) 100 MG capsule TAKE 1 CAPSULE(100 MG) BY MOUTH THREE TIMES DAILY 90 capsule 0   glucose blood (FREESTYLE LITE) test strip USE AS INSTRUCTED FOUR TIMES DAILY 150 strip 2   HYDROcodone-acetaminophen (NORCO/VICODIN) 5-325 MG tablet Take 1-2 tablets by mouth every 6 (six) hours as needed for moderate pain. 30 tablet 0   Insulin Disposable Pump (OMNIPOD CLASSIC PODS, GEN 3,) MISC CHANGE EVERY 48 HOURS AS DIRECTED 45 each 2   insulin lispro (HUMALOG) 100 UNIT/ML injection INJECT UP TO 90 UNITS UNDER THE SKIN  DAILY VIA INSULIN PUMP 90 mL 1   Insulin Pen Needle 32G X 4 MM MISC Use 5 pens per day to inject insulin 200 each 3   Insulin Syringe-Needle U-100 (INSULIN SYRINGE .5CC/30GX5/16") 30G X 5/16" 0.5 ML MISC Use 3 times a day for insulin 100 each 1   losartan (COZAAR) 50 MG tablet TAKE 1 TABLET(50 MG) BY MOUTH DAILY 90 tablet 1   methocarbamol (ROBAXIN) 500 MG tablet TAKE 1 TABLET(500 MG) BY MOUTH EVERY 6 HOURS AS NEEDED FOR MUSCLE SPASMS 40 tablet 1   NON FORMULARY Take 2 capsules by mouth daily. 4LIFE TRANSFER FACTORY CARDIO     OVER THE COUNTER MEDICATION Take 2 tablets by mouth daily. 4LIFE TRANSFER FACTORY RECALL     SYNJARDY XR 03-999 MG TB24 TAKE 2 TABLETS BY MOUTH DAILY 60 tablet 2   TRULICITY 1.5 MG/0.5ML SOPN ADMINISTER 1.5 MG UNDER THE SKIN 1 TIME WEEKLY AS DIRECTED 2 mL 2   No current facility-administered medications on file prior to visit.    BP 108/70    Pulse 77    Resp 18    Ht 5\' 3"  (1.6 m)    Wt 191 lb (86.6 kg)    SpO2 98%    BMI 33.83 kg/m       Objective:   Physical Exam  General Mental Status- Alert. General Appearance- Not in acute distress.   Skin General: Color- Normal Color. Moisture- Normal Moisture.  Neck Carotid Arteries- Normal color. Moisture- Normal Moisture. No carotid bruits. No JVD.  Chest and Lung Exam Auscultation: Breath Sounds:-Normal.  Cardiovascular Auscultation:Rythm- Regular. Murmurs & Other Heart Sounds:Auscultation of the heart reveals- No Murmurs.  Abdomen Inspection:-Inspeection Normal. Palpation/Percussion:Note:No mass. Palpation and Percussion of the abdomen reveal- faint left lower quadrant Tender, Non Distended + BS, no rebound or guarding.   Neurologic Cranial Nerve exam:- CN III-XII intact(No nystagmus), symmetric smile. Strength:- 5/5 equal and symmetric strength both upper and lower extremities.       Assessment & Plan:   Patient Instructions  One month of left lower quadrant pain. Approaching holidary weekend and  office close on Monday. Will rx cipro and flagyl antibiotic. Treat for possible diverticulitis. If pain persists consider ct abd/pelvis. On review may be due for colonosocpy at I saw on report one polyp found in 2017. Referring to GI MD to evaluate area and to see if colonoscopy due.  For recent sour taste to mouth will rx famotadine. Sour taste could be reflux. See if this helps.  For high cholesterol continue statin and get lipid panel today with cmp.  For htn continue losrartan.   Follow up with Dr. 2018 for  diabetes. Did place referral for diabetic eye exam  I think you left knee pain is coming along. Transient hip pain may be related to gait change but if constant pain let me know and will get hip xray.  Follow up in 10 days or sooner if needed.   Esperanza Richters, PA-C

## 2021-11-15 ENCOUNTER — Other Ambulatory Visit: Payer: Self-pay | Admitting: Endocrinology

## 2021-11-18 ENCOUNTER — Other Ambulatory Visit: Payer: Self-pay | Admitting: Endocrinology

## 2021-11-22 ENCOUNTER — Other Ambulatory Visit: Payer: Self-pay | Admitting: Endocrinology

## 2021-12-07 ENCOUNTER — Other Ambulatory Visit: Payer: Self-pay

## 2021-12-07 ENCOUNTER — Telehealth: Payer: Self-pay

## 2021-12-07 DIAGNOSIS — E1165 Type 2 diabetes mellitus with hyperglycemia: Secondary | ICD-10-CM

## 2021-12-07 MED ORDER — TOUJEO SOLOSTAR 300 UNIT/ML ~~LOC~~ SOPN: PEN_INJECTOR | SUBCUTANEOUS | 0 refills | Status: DC

## 2021-12-07 NOTE — Telephone Encounter (Signed)
Daughter called in stating that her father's omnipod is messed up and needs a new one. Wants to know what to do until the new one is sent to him.

## 2021-12-08 NOTE — Telephone Encounter (Signed)
Information was given to patient on yesterday and he is scheduled to see you on Thursday

## 2021-12-10 ENCOUNTER — Encounter: Payer: Self-pay | Admitting: Endocrinology

## 2021-12-10 ENCOUNTER — Ambulatory Visit (INDEPENDENT_AMBULATORY_CARE_PROVIDER_SITE_OTHER): Payer: Medicare Other | Admitting: Endocrinology

## 2021-12-10 ENCOUNTER — Other Ambulatory Visit: Payer: Self-pay

## 2021-12-10 VITALS — BP 144/80 | HR 73 | Ht 63.0 in | Wt 205.0 lb

## 2021-12-10 DIAGNOSIS — E1165 Type 2 diabetes mellitus with hyperglycemia: Secondary | ICD-10-CM

## 2021-12-10 DIAGNOSIS — Z794 Long term (current) use of insulin: Secondary | ICD-10-CM

## 2021-12-10 DIAGNOSIS — I10 Essential (primary) hypertension: Secondary | ICD-10-CM

## 2021-12-10 LAB — POCT GLYCOSYLATED HEMOGLOBIN (HGB A1C): Hemoglobin A1C: 7 % — AB (ref 4.0–5.6)

## 2021-12-10 NOTE — Progress Notes (Signed)
Patient ID: Samuel [name], male   DOB: Dec 07, 1950, 71 y.o.   MRN: [id number]            Reason for Appointment:  Followup for Type 2 Diabetes  Referring physician: Saguier  History of Present Illness:          Diagnosis: Type 2 diabetes mellitus, date of diagnosis:  1990      Past history: He thinks he has been on insulin for the last 10-12 years. Previously had been on metformin which has been continued. He was probably tried on different insulin regimens initially but has been taking 70/30 mostly because of cost Previous records are not available and appears that his sugars have been poorly controlled for several years He was  referred here because of an A1c in 8/15 of 9.7%. He was on a regimen of Novolin 70/30 twice a day but because of inadequate control and higher readings later in the day he was switched to basal bolus insulin regimen in 12/15. Previous insulin regimen: Humalog  ac meals 35-40-30 Toujeo 35-40 units daily  Recent history:   INSULIN regimen is: OMNIPOD INSULIN PUMP  Basal rate settings: Midnight = 1.3, 6am: 1.65, 5 PM = 1.6.  Boluses 12 units breakfast -14 units lunch and 18 units at dinner at meals Total daily insulin up to 80 units  Carbohydrate coverage 1:10 and correction 1:50 Usual mealtime boluses: 12 units at breakfast, 14 at lunch and 18 at dinner   Non-insulin hypoglycemic drugs:   Trulicity 1.5 mg weekly, Synjardy XR 03/999, 2 tablets daily  A1c is now 7.0 and improved  Current basal insulin about 36 units and bolus 20 units/day  Current management, blood sugar patterns and problems identified: He states that his blood sugars are higher now and he thinks that he has not been getting his basal insulin delivery from his pump which he calls the automated insulin for about 2 weeks Review of his pump has data only for the last 3 days or so and he may have had the wrong date programmed previously Also now his pump basal rate is only 0.05 instead of the  previous settings as above Also he was unaware of how to contact the manufacturer for difficulties with the pump History is somewhat unclear as his freestyle libre does not show significant hyperglycemia including overnight with only rare blood sugars 200 in the morning He was given Toujeo and Humalog injections to use since he thinks his pump is not working but he only took an injection once He thinks he is still taking boluses up to 18 units at mealtimes His weight is going up He has recovered from his knee surgery but not still doing much walking for exercise Unclear how accurate his freestyle libre as compared to fingersticks  His mealtimes are generally variable, evening meal 5-7 PM       Side effects from medications have been: ?  Nausea/dizziness on Victoza   Analysis of the freestyle libre download for the last 2 weeks through 12/10/2021:  Overnight blood sugars are generally excellent and averaging below 130, he has a rise in blood sugar after about 7 AM with highest readings around 10 AM and subsequently persistently mildly increased blood sugars still bedtime HYPERGLYCEMIA is seen periodically midmorning but also Premeal blood sugars appear to be higher in the afternoon and early evening averaging about 150-160 Lowest blood sugars are between 2-4 AM without hypoglycemia Variability is not noted except in the morning hours after waking  up   CGM use % of time 96  2-week average/GV 149  Time in range    83    %  % Time Above 180 17  % Time above 250   % Time Below 70      PRE-MEAL Fasting Lunch Dinner Bedtime Overall  Glucose range:       Averages: 136 143 159 160 149   POST-MEAL PC Breakfast PC Lunch PC Dinner  Glucose range:     Averages: 170 153 167    Prior:   PRE-MEAL Fasting Lunch Dinner Bedtime Overall  Glucose range:     50-284  Mean/median: 143 183 193  161   POST-MEAL PC Breakfast PC Lunch PC Dinner  Glucose range:     Mean/median:   171       Dietician visit, most recent: 5/16 Last CDE visit: 12/2016            Weight history: Previous range 200-220  Wt Readings from Last 3 Encounters:  12/10/21 205 lb (93 kg)  10/30/21 191 lb (86.6 kg)  09/18/21 197 lb (89.4 kg)    Glycemic control:   Lab Results  Component Value Date   HGBA1C 7.0 (A) 12/10/2021   HGBA1C 7.3 (H) 06/22/2021   HGBA1C 7.1 (A) 03/19/2021   Lab Results  Component Value Date   MICROALBUR <0.7 12/16/2020   LDLCALC 44 10/30/2021   CREATININE 0.76 10/30/2021    Lab Results  Component Value Date   FRUCTOSAMINE 302 (H) 07/20/2017       Allergies as of 12/10/2021   No Known Allergies      Medication List        Accurate as of December 10, 2021 11:14 AM. If you have any questions, ask your nurse or doctor.          aspirin 81 MG chewable tablet Chew 1 tablet (81 mg total) by mouth 2 (two) times daily.   atorvastatin 40 MG tablet Commonly known as: LIPITOR TAKE 1 TABLET BY MOUTH DAILY THEN TAKE 1/2 TABLET BY MOUTH AT BEDTIME What changed: See the new instructions.   ciprofloxacin 500 MG tablet Commonly known as: CIPRO Take 1 tablet (500 mg total) by mouth 2 (two) times daily.   famotidine 20 MG tablet Commonly known as: PEPCID Take 1 tablet (20 mg total) by mouth daily.   FREESTYLE LITE test strip Generic drug: glucose blood USE AS INSTRUCTED FOUR TIMES DAILY   gabapentin 100 MG capsule Commonly known as: NEURONTIN TAKE 1 CAPSULE(100 MG) BY MOUTH THREE TIMES DAILY   HYDROcodone-acetaminophen 5-325 MG tablet Commonly known as: NORCO/VICODIN Take 1-2 tablets by mouth every 6 (six) hours as needed for moderate pain.   insulin lispro 100 UNIT/ML injection Commonly known as: HumaLOG INJECT UP TO 90 UNITS UNDER THE SKIN DAILY VIA INSULIN PUMP   Insulin Pen Needle 32G X 4 MM Misc Use 5 pens per day to inject insulin   B-D UF III MINI PEN NEEDLES 31G X 5 MM Misc Generic drug: Insulin Pen Needle USE FIVE PER DAY AS DIRECTED    INSULIN SYRINGE .5CC/30GX5/16" 30G X 5/16" 0.5 ML Misc Use 3 times a day for insulin   losartan 50 MG tablet Commonly known as: COZAAR TAKE 1 TABLET(50 MG) BY MOUTH DAILY   methocarbamol 500 MG tablet Commonly known as: ROBAXIN TAKE 1 TABLET(500 MG) BY MOUTH EVERY 6 HOURS AS NEEDED FOR MUSCLE SPASMS   NON FORMULARY Take 2 capsules by mouth daily. 4LIFE TRANSFER  FACTORY CARDIO   Omnipod Classic Pods (Gen 3) Misc CHANGE EVERY 48 HOURS AS DIRECTED   OVER THE COUNTER MEDICATION Take 2 tablets by mouth daily. 4LIFE TRANSFER FACTORY RECALL   Synjardy XR 03-999 MG Tb24 Generic drug: Empagliflozin-metFORMIN HCl ER TAKE 2 TABLETS BY MOUTH DAILY   Toujeo SoloStar 300 UNIT/ML Solostar Pen Generic drug: insulin glargine (1 Unit Dial) Inject 40 units daily   Trulicity 1.5 MG/0.5ML Sopn Generic drug: Dulaglutide ADMINISTER 1.5 MG UNDER THE SKIN 1 TIME WEEKLY AS DIRECTED        Allergies: No Known Allergies  Past Medical History:  Diagnosis Date   Blood transfusion without reported diagnosis    Diabetes mellitus without complication (HCC)    type II   History of ETOH abuse    Hypertension     Past Surgical History:  Procedure Laterality Date   BACK SURGERY N/A 2010   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   Procedure: LEFT TOTAL KNEE ARTHROPLASTY;  Surgeon: Kathryne Hitch, MD;  Location: [facility];  Service: Orthopedics;  Laterality: Left;    Family History  Problem Relation Age of Onset   Diabetes Unknown    Hypertension Unknown    Arthritis Father    Diabetes Father     Social History:  reports that he has quit smoking. He quit smokeless tobacco use about 28 years ago. He reports that he does not drink alcohol and does not use drugs.    Review of Systems         Lipids:  He has been on Lipitor 40 mg from his PCP for hypercholesterolemia since about 2011, lipid levels as below         Lab Results  Component Value Date   CHOL 118 10/30/2021   HDL  45.60 10/30/2021   LDLCALC 44 10/30/2021   LDLDIRECT 44.0 01/24/2020   TRIG 138.0 10/30/2021   CHOLHDL 3 10/30/2021    Hypertension: Has been treated with losartan 50 mg, prescribed by PCP Also on Jardiance Blood pressure sometimes higher on first measurement  BP Readings from Last 3 Encounters:  12/10/21 (!) 144/80  10/30/21 108/70  08/08/21 125/72    Microalbumin normal as of 3/21   Physical Examination:  BP (!) 144/80    Pulse 73    Ht 5\' 3"  (1.6 m)    Wt 205 lb (93 kg)    SpO2 96%    BMI 36.31 kg/m     ASSESSMENT:  Diabetes type 2, with obesity and BMI of around 36  See history of present illness for  description of current diabetes management, blood sugar patterns and problems identified..  His diabetes is being managed with the Omnipod insulin pump, Synjardy and Trulicity 1.5 mg  His A1c is 7.0  His blood sugars are improved and this is despite his weight gain Appears to be less insulin deficient as he has only minimal increase in his blood sugars lately with not delivering his basal insulin on the pump  He likely had information erased from his basal settings when changing his battery sometime ago and he was not aware of what was going on with his pump His basal rate was set at 0.05, previously basal rates were between 1.30 and 1.65 Not clear what his bolus compliance is since his pump could not be downloaded today  However he is likely doing better with having the facility of his freestyle libre sensor with consistent monitoring and may be doing better with boluses  HYPERTENSION: His blood pressure is relatively higher but may be partly whitecoat syndrome He will continue follow-up with his PCP  Will need follow-up urine microalbumin    PLAN:   His basal rates will be reprogrammed today Since he thinks he may have had low sugars overnight will only use 0.9 basal rate until 6 AM overnight and during the day 1.6 units per hour This was done in the  office for him Also told him to call the phone number on the back of the pump if he has any further difficulties including when he changes his battery Showed him how to locate his basal rate settings  No change in his boluses as yet He will continue Trulicity, Synjardy Needs more regular follow-up and scheduled him for follow-up in 2 months Have encouraged him to start walking for exercise and cut back on overall calories to provide some weight loss  Follow-up chemistry labs to be done on the next visit  Total visit time including counseling = 30 minutes  There are no Patient Instructions on file for this visit.   Reather LittlerAjay Aimie Wagman 12/10/2021, 11:14 AM   Note: This office note was prepared with Dragon voice recognition system technology. Any transcriptional errors that result from this process are unintentional. May

## 2021-12-17 ENCOUNTER — Ambulatory Visit (HOSPITAL_BASED_OUTPATIENT_CLINIC_OR_DEPARTMENT_OTHER)
Admission: RE | Admit: 2021-12-17 | Discharge: 2021-12-17 | Disposition: A | Discharge status: Home / Self Care | Payer: Medicare Other | Source: Ambulatory Visit | Attending: Medical | Admitting: Medical

## 2021-12-17 ENCOUNTER — Other Ambulatory Visit: Payer: Self-pay

## 2021-12-17 ENCOUNTER — Encounter: Payer: Self-pay | Admitting: Endocrinology

## 2021-12-17 ENCOUNTER — Ambulatory Visit (INDEPENDENT_AMBULATORY_CARE_PROVIDER_SITE_OTHER): Payer: Medicare Other | Admitting: Medical

## 2021-12-17 VITALS — BP 146/70 | HR 67 | Resp 18 | Ht 63.0 in | Wt 209.6 lb

## 2021-12-17 DIAGNOSIS — M25511 Pain in right shoulder: Secondary | ICD-10-CM | POA: Insufficient documentation

## 2021-12-17 DIAGNOSIS — M25512 Pain in left shoulder: Secondary | ICD-10-CM | POA: Diagnosis present

## 2021-12-17 DIAGNOSIS — I10 Essential (primary) hypertension: Secondary | ICD-10-CM

## 2021-12-17 DIAGNOSIS — R251 Tremor, unspecified: Secondary | ICD-10-CM | POA: Diagnosis not present

## 2021-12-17 MED ORDER — DICLOFENAC SODIUM 75 MG PO TBEC: 75.0000 mg | DELAYED_RELEASE_TABLET | Freq: Two times a day (BID) | ORAL | 0 refills | Status: DC

## 2021-12-17 NOTE — Patient Instructions (Addendum)
Bilateral shoulder pain over the past months.  Decreased range of motion/not able to bring arms above shoulder level without significant pain.  We will get bilateral shoulder x-ray to assess joint space and see if any other findings present.  I am prescribing diclofenac for pain and inflammation.  If x-rays negative and symptoms not improving then would refer to sports medicine to evaluate possible rotator cuff etiology.  Blood pressure is borderline today.  Continue losartan.  Check blood pressures daily.  Diclofenac can elevate blood pressure.  We will take that into consideration on BP levels but might need to increase your losartan to 100 mg daily.  Please notify us if you see any blood pressures above 150/90.   Recent increasing tremor bilaterally.  Worse on the right hand.  Consider use of beta-blocker but decided against since that could mask hypoglycemic signs and symptoms and you are not diabetic taking insulin.  Notify me if tremors worsen.  In that case would refer to neurologist.  Follow-up in 3 weeks or sooner if needed.

## 2021-12-17 NOTE — Progress Notes (Signed)
Subjective:    Patient ID: Samuel [name], male    DOB: [birth year], 71 y.o.   MRN: [id number]  HPI  Pt states he has been having bilateral shoulder pain for one months. Pain when lifts arms. He can lift arms above shoulder level without moderate pain.  Pt states for one month pain preventing adequate sleep.   Pt has some pain medicine norco at house for prior knee pain post knee operation. But has not been using.    Htn- bp mild elevated initially. Pt on losartan 50 mg daily.   Pt also reports increasing trembling to both hands. Worse rt side but some on left side. Gradual worsening over past 2 years. No family hx of tremors that he can remember in parents. No mask face. No shuffle gait or stiff upper ext movement.   Review of Systems  Constitutional:  Negative for chills, fatigue and fever.  Respiratory:  Negative for cough, chest tightness, shortness of breath and wheezing.   Cardiovascular:  Negative for chest pain and palpitations.  Gastrointestinal:  Negative for abdominal pain.  Musculoskeletal:        Shoulder pain.  Skin:  Negative for rash.  Neurological:        See HPI tremors.  Psychiatric/Behavioral:  Negative for behavioral problems, confusion and decreased concentration. The patient is not nervous/anxious.     Past Medical History:  Diagnosis Date   Blood transfusion without reported diagnosis    Diabetes mellitus without complication (HCC)    type II   History of ETOH abuse    Hypertension      Social History   Socioeconomic History   Marital status: Married    Spouse name: Not on file   Number of children: Not on file   Years of education: Not on file   Highest education level: Not on file  Occupational History   Not on file  Tobacco Use   Smoking status: Former   Smokeless tobacco: Former    Quit date: 11/08/1993  Vaping Use   Vaping Use: Never used  Substance and Sexual Activity   Alcohol use: No   Drug use: No   Sexual activity: Not on  file  Other Topics Concern   Not on file  Social History Narrative   Not on file   Social Determinants of Health   Financial Resource Strain: Not on file  Food Insecurity: Not on file  Transportation Needs: Not on file  Physical Activity: Not on file  Stress: Not on file  Social Connections: Not on file  Intimate Partner Violence: Not on file    Past Surgical History:  Procedure Laterality Date   BACK SURGERY N/A 2010   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   Procedure: LEFT TOTAL KNEE ARTHROPLASTY;  Surgeon: Kathryne Hitch, MD;  Location: [facility];  Service: Orthopedics;  Laterality: Left;    Family History  Problem Relation Age of Onset   Diabetes Unknown    Hypertension Unknown    Arthritis Father    Diabetes Father     No Known Allergies  Current Outpatient Medications on File Prior to Visit  Medication Sig Dispense Refill   aspirin 81 MG chewable tablet Chew 1 tablet (81 mg total) by mouth 2 (two) times daily. 60 tablet 0   atorvastatin (LIPITOR) 40 MG tablet TAKE 1 TABLET BY MOUTH DAILY THEN TAKE 1/2 TABLET BY MOUTH AT BEDTIME (Patient taking differently: Take 40 mg by mouth daily.) 135 tablet 0  B-D UF III MINI PEN NEEDLES 31G X 5 MM MISC USE FIVE PER DAY AS DIRECTED 200 each 0   ciprofloxacin (CIPRO) 500 MG tablet Take 1 tablet (500 mg total) by mouth 2 (two) times daily. 14 tablet 0   famotidine (PEPCID) 20 MG tablet Take 1 tablet (20 mg total) by mouth daily. 30 tablet 0   gabapentin (NEURONTIN) 100 MG capsule TAKE 1 CAPSULE(100 MG) BY MOUTH THREE TIMES DAILY 90 capsule 0   glucose blood (FREESTYLE LITE) test strip USE AS INSTRUCTED FOUR TIMES DAILY 150 strip 2   HYDROcodone-acetaminophen (NORCO/VICODIN) 5-325 MG tablet Take 1-2 tablets by mouth every 6 (six) hours as needed for moderate pain. 30 tablet 0   Insulin Disposable Pump (OMNIPOD CLASSIC PODS, GEN 3,) MISC CHANGE EVERY 48 HOURS AS DIRECTED 45 each 2   insulin glargine, 1 Unit Dial, (TOUJEO  SOLOSTAR) 300 UNIT/ML Solostar Pen Inject 40 units daily 3 mL 0   insulin lispro (HUMALOG) 100 UNIT/ML injection INJECT UP TO 90 UNITS UNDER THE SKIN DAILY VIA INSULIN PUMP 90 mL 1   Insulin Pen Needle 32G X 4 MM MISC Use 5 pens per day to inject insulin 200 each 3   Insulin Syringe-Needle U-100 (INSULIN SYRINGE .5CC/30GX5/16") 30G X 5/16" 0.5 ML MISC Use 3 times a day for insulin 100 each 1   losartan (COZAAR) 50 MG tablet TAKE 1 TABLET(50 MG) BY MOUTH DAILY 90 tablet 1   methocarbamol (ROBAXIN) 500 MG tablet TAKE 1 TABLET(500 MG) BY MOUTH EVERY 6 HOURS AS NEEDED FOR MUSCLE SPASMS 40 tablet 1   NON FORMULARY Take 2 capsules by mouth daily. 4LIFE TRANSFER FACTORY CARDIO     OVER THE COUNTER MEDICATION Take 2 tablets by mouth daily. 4LIFE TRANSFER FACTORY RECALL     SYNJARDY XR 03-999 MG TB24 TAKE 2 TABLETS BY MOUTH DAILY 60 tablet 0   TRULICITY 1.5 MG/0.5ML SOPN ADMINISTER 1.5 MG UNDER THE SKIN 1 TIME WEEKLY AS DIRECTED 2 mL 2   No current facility-administered medications on file prior to visit.    BP (!) 146/70    Pulse 67    Resp 18    Ht 5\' 3"  (1.6 m)    Wt 209 lb 9.6 oz (95.1 kg)    SpO2 95%    BMI 37.13 kg/m       Objective:   Physical Exam   General Mental Status- Alert. General Appearance- Not in acute distress.   Skin General: Color- Normal Color. Moisture- Normal Moisture.  Neck Carotid Arteries- Normal color. Moisture- Normal Moisture. No carotid bruits. No JVD.  Chest and Lung Exam Auscultation: Breath Sounds:-Normal.  Cardiovascular Auscultation:Rythm- Regular. Murmurs & Other Heart Sounds:Auscultation of the heart reveals- No Murmurs.  Abdomen Inspection:-Inspeection Normal. Palpation/Percussion:Note:No mass. Palpation and Percussion of the abdomen reveal- Non Tender, Non Distended + BS, no rebound or guarding.   Neurologic Cranial Nerve exam:- CN III-XII intact(No nystagmus), symmetric smile. Drift Test:- No drift. Romberg Exam:- Negative.  Heal to Toe  Gait exam:-Normal. Finger to Nose:- Normal/Intact Strength:- 5/5 equal and symmetric strength both upper and lower extremities.  Pt has mild tremor presently on rt hand. Not seen on exam left side.  Shoulder- pain on palpation anterior aspect and pain on lifting arm to shoulder level. No mask face. On walking no shuffled gaint.    Assessment & Plan:   Patient Instructions  Bilateral shoulder pain over the past months.  Decreased range of motion/not able to bring arms above shoulder level without  significant pain.  We will get bilateral shoulder x-ray to assess joint space and see if any other findings present.  I am prescribing diclofenac for pain and inflammation.  If x-rays negative and symptoms not improving then would refer to sports medicine to evaluate possible rotator cuff etiology.  Blood pressure is borderline today.  Continue losartan.  Check blood pressures daily.  Diclofenac can elevate blood pressure.  We will take that into consideration on BP levels but might need to increase your losartan to 100 mg daily.  Please notify us if you see any blood pressures above 150/90.   Recent increasing tremor bilaterally.  Worse on the right hand.  Consider use of beta-blocker but decided against since that could mask hypoglycemic signs and symptoms and you are not diabetic taking insulin.  Notify me if tremors worsen.  In that case would refer to neurologist.  Follow-up in 3 weeks or sooner if needed.    Esperanza Richters, PA-C

## 2021-12-19 NOTE — Addendum Note (Signed)
Addended by: Gwenevere Abbot on: 12/19/2021 08:56 AM   Modules accepted: Orders

## 2021-12-21 ENCOUNTER — Other Ambulatory Visit: Payer: Self-pay | Admitting: Endocrinology

## 2021-12-21 NOTE — Addendum Note (Signed)
Addended by: Gwenevere Abbot on: 12/21/2021 05:06 PM   Modules accepted: Orders

## 2022-01-06 LAB — HM DIABETES EYE EXAM

## 2022-01-11 ENCOUNTER — Encounter (HOSPITAL_BASED_OUTPATIENT_CLINIC_OR_DEPARTMENT_OTHER): Payer: Self-pay | Admitting: *Deleted

## 2022-01-11 ENCOUNTER — Emergency Department (HOSPITAL_BASED_OUTPATIENT_CLINIC_OR_DEPARTMENT_OTHER)
Admission: EM | Admit: 2022-01-11 | Discharge: 2022-01-11 | Disposition: A | Discharge status: Home / Self Care | Payer: Medicare Other | Attending: Emergency Medicine | Admitting: Emergency Medicine

## 2022-01-11 ENCOUNTER — Other Ambulatory Visit: Payer: Self-pay

## 2022-01-11 DIAGNOSIS — Z79899 Other long term (current) drug therapy: Secondary | ICD-10-CM | POA: Diagnosis not present

## 2022-01-11 DIAGNOSIS — Z7982 Long term (current) use of aspirin: Secondary | ICD-10-CM | POA: Insufficient documentation

## 2022-01-11 DIAGNOSIS — Z23 Encounter for immunization: Secondary | ICD-10-CM | POA: Insufficient documentation

## 2022-01-11 DIAGNOSIS — Z794 Long term (current) use of insulin: Secondary | ICD-10-CM | POA: Diagnosis not present

## 2022-01-11 DIAGNOSIS — T23102A Burn of first degree of left hand, unspecified site, initial encounter: Secondary | ICD-10-CM | POA: Insufficient documentation

## 2022-01-11 DIAGNOSIS — T300 Burn of unspecified body region, unspecified degree: Secondary | ICD-10-CM

## 2022-01-11 DIAGNOSIS — X102XXA Contact with fats and cooking oils, initial encounter: Secondary | ICD-10-CM | POA: Insufficient documentation

## 2022-01-11 MED ORDER — TETANUS-DIPHTH-ACELL PERTUSSIS 5-2.5-18.5 LF-MCG/0.5 IM SUSY
0.5000 mL | PREFILLED_SYRINGE | Freq: Once | INTRAMUSCULAR | Status: AC
  Administered 2022-01-11: 0.5 mL via INTRAMUSCULAR
  Filled 2022-01-11: qty 0.5

## 2022-01-11 NOTE — ED Provider Notes (Signed)
?[facility] [facility] EMERGENCY DEPARTMENT ?Provider Note ? ? ?CSN: [id number] ?Arrival date & time: 01/11/22  8127 ? ?  ? ?History ? ?Chief Complaint  ?Patient presents with  ? Burn  ? ? ?Samuel [name] is a 71 y.o. male.  Presented to the emergency department with concern for burn.  Burn happened about 5 days ago.  Occurred while cooking.  Did not seek care at the time.  States that it was cooking oil that spilled on his hand.  Has developed a scab.  No significant pain.  Able to move all his fingers without difficulty.  No significant redness.  No fevers or chills.  States that he contacted his primary care office this morning and they advised going to ER for initial evaluation and wound care. ? ?Wife provides some additional history. ? ?Utilize Electronics engineer. ? ?Past medical history diabetes. ? ?Reviewed last PCP note, endocrinology note.  History of diabetes.  On insulin. ? ? ?HPI ? ?  ? ?Home Medications ?Prior to Admission medications   ?Medication Sig Start Date End Date Taking? Authorizing Provider  ?aspirin 81 MG chewable tablet Chew 1 tablet (81 mg total) by mouth 2 (two) times daily. 08/08/21   Kathryne Hitch, MD  ?atorvastatin (LIPITOR) 40 MG tablet TAKE 1 TABLET BY MOUTH DAILY THEN TAKE 1/2 TABLET BY MOUTH AT BEDTIME ?Patient taking differently: Take 40 mg by mouth daily. 05/20/21   Saguier, Ramon Dredge, PA-C  ?B-D UF III MINI PEN NEEDLES 31G X 5 MM MISC USE FIVE PER DAY AS DIRECTED 07/26/17   Reather Littler, MD  ?ciprofloxacin (CIPRO) 500 MG tablet Take 1 tablet (500 mg total) by mouth 2 (two) times daily. 10/30/21   Saguier, Ramon Dredge, PA-C  ?diclofenac (VOLTAREN) 75 MG EC tablet Take 1 tablet (75 mg total) by mouth 2 (two) times daily. 12/17/21   Saguier, Ramon Dredge, PA-C  ?famotidine (PEPCID) 20 MG tablet Take 1 tablet (20 mg total) by mouth daily. 10/30/21   Saguier, Ramon Dredge, PA-C  ?gabapentin (NEURONTIN) 100 MG capsule TAKE 1 CAPSULE(100 MG) BY MOUTH THREE TIMES DAILY 10/12/21   Saguier, Ramon Dredge, PA-C   ?glucose blood (FREESTYLE LITE) test strip USE AS INSTRUCTED FOUR TIMES DAILY 12/16/20   Reather Littler, MD  ?HYDROcodone-acetaminophen (NORCO/VICODIN) 5-325 MG tablet Take 1-2 tablets by mouth every 6 (six) hours as needed for moderate pain. 09/17/21   Kirtland Bouchard, PA-C  ?Insulin Disposable Pump (OMNIPOD CLASSIC PODS, GEN 3,) MISC CHANGE EVERY 48 HOURS AS DIRECTED 10/26/21   Reather Littler, MD  ?insulin glargine, 1 Unit Dial, (TOUJEO SOLOSTAR) 300 UNIT/ML Solostar Pen Inject 40 units daily 12/07/21   Reather Littler, MD  ?insulin lispro (HUMALOG) 100 UNIT/ML injection INJECT UP TO 90 UNITS UNDER THE SKIN DAILY VIA INSULIN PUMP 10/20/21   Reather Littler, MD  ?Insulin Pen Needle 32G X 4 MM MISC Use 5 pens per day to inject insulin 01/25/17   Reather Littler, MD  ?Insulin Syringe-Needle U-100 (INSULIN SYRINGE .5CC/30GX5/16") 30G X 5/16" 0.5 ML MISC Use 3 times a day for insulin 04/18/17   Reather Littler, MD  ?losartan (COZAAR) 50 MG tablet TAKE 1 TABLET(50 MG) BY MOUTH DAILY 08/31/21   Saguier, Ramon Dredge, PA-C  ?methocarbamol (ROBAXIN) 500 MG tablet TAKE 1 TABLET(500 MG) BY MOUTH EVERY 6 HOURS AS NEEDED FOR MUSCLE SPASMS 09/15/21   Kathryne Hitch, MD  ?NON FORMULARY Take 2 capsules by mouth daily. 4LIFE TRANSFER FACTORY CARDIO    [provider]  ?OVER THE COUNTER MEDICATION Take  2 tablets by mouth daily. 4LIFE TRANSFER FACTORY RECALL    [provider]  ?SYNJARDY XR 03-999 MG TB24 TAKE 2 TABLETS BY MOUTH DAILY 11/17/21   Reather Littler, MD  ?TRULICITY 1.5 MG/0.5ML SOPN ADMINISTER 1.5 MG UNDER THE SKIN 1 TIME WEEKLY AS DIRECTED 12/22/21   Reather Littler, MD  ?   ? ?Allergies    ?Patient has no known allergies.   ? ?Review of Systems   ?Review of Systems  ?Skin:  Positive for wound.  ?All other systems reviewed and are negative. ? ?Physical Exam ?Updated Vital Signs ?BP 133/77   Pulse 64   Temp 98.1 ?F (36.7 ?C) (Oral)   Resp 18   Ht 5\' 3"  (1.6 m)   Wt 93.4 kg   SpO2 97%   BMI 36.49 kg/m?  ?Physical Exam ?Vitals and  nursing note reviewed.  ?Constitutional:   ?   General: He is not in acute distress. ?   Appearance: He is well-developed.  ?HENT:  ?   Head: Normocephalic and atraumatic.  ?Eyes:  ?   Conjunctiva/sclera: Conjunctivae normal.  ?Cardiovascular:  ?   Rate and Rhythm: Normal rate.  ?   Pulses: Normal pulses.  ?Pulmonary:  ?   Effort: Pulmonary effort is normal. No respiratory distress.  ?Musculoskeletal:     ?   General: No swelling.  ?   Cervical back: Neck supple.  ?   Comments: Left hand: There is approximately 1 cm diameter superficial burn that appears to be healing well over the dorsum of the left hand in between first and second digits.  No surrounding erythema, no fluctuance or purulence, no blisters noted  ?Skin: ?   General: Skin is warm and dry.  ?   Capillary Refill: Capillary refill takes less than 2 seconds.  ?Neurological:  ?   General: No focal deficit present.  ?   Mental Status: He is alert.  ?Psychiatric:     ?   Mood and Affect: Mood normal.  ? ?  ?Media Information ?Document Information ? ?Photos  ?Left hand  ?01/11/2022 11:35  ?Attached To:  ?Hospital Encounter on 01/11/22  ? ?Source Information ? ?03/13/22, MD  [facility]  ? ? ?ED Results / Procedures / Treatments   ?Labs ?(all labs ordered are listed, but only abnormal results are displayed) ?Labs Reviewed - No data to display ? ?EKG ?None ? ?Radiology ?No results found. ? ?Procedures ?Procedures  ? ? ?Medications Ordered in ED ?Medications  ?Tdap (BOOSTRIX) injection 0.5 mL (has no administration in time range)  ? ? ?ED Course/ Medical Decision Making/ A&P ?  ?                        ?Medical Decision Making ?Risk ?Prescription drug management. ? ? ?72 year old male presenting to ER with concern for left hand burn.  Burn occurred 5 days ago.  On exam burn appears to be superficial, relatively small area.  He has normal flexion extension of all his digits.  No significant erythema to suggest superimposed infection at this  time.  Due to location on hand, I did discuss the case with on-call hand surgery, Dr. 66.  He states that he would recommend local wound care, following up with patient's primary care doctor for follow-up.  Does not necessarily have to follow-up with specialist.  Discussed these recommendations with patient, RN here to provide local wound care and patient to follow-up with his PCP.  Updated tetanus, discharged. ? ? ? ?After the discussed management above, the patient was determined to be safe for discharge.  The patient was in agreement with this plan and all questions regarding their care were answered.  ED return precautions were discussed and the patient will return to the ED with any significant worsening of condition. ? ? ? ? ? ? ? ? ?Final Clinical Impression(s) / ED Diagnoses ?Final diagnoses:  ?Burn  ? ? ?Rx / DC Orders ?ED Discharge Orders   ? ? None  ? ?  ? ? ?  ?Milagros Loll, MD ?01/11/22 1213 ? ?

## 2022-01-11 NOTE — ED Notes (Addendum)
Presents to ED with burn on left hand at base of rt thumb, states 5 days ago, cooking oil spilled onto his hand, has old burn, scab like area. Appears comfortable at this time. Pt also states unknown on tetanus status ?Has full ROM of all digits on Rt hand, capillary refill WNL, denies any numbness or tingling ?

## 2022-01-11 NOTE — ED Triage Notes (Signed)
Burn to left hand 5 days ago scabbed w slight redness ?

## 2022-01-11 NOTE — Discharge Instructions (Signed)
Please follow up with your primary doctor in the next few days for wound. Keep area clean and dry. Use dressings as discussed. If you develop increasing redness come back to ER for reassessment.  ?

## 2022-01-11 NOTE — ED Notes (Signed)
Wound cleansed and wrapped with guaze bandage. Cap refills < 3, pt able to wiggle fingers, states dressing is "feeling good".  ?

## 2022-01-15 ENCOUNTER — Other Ambulatory Visit: Payer: Self-pay | Admitting: Endocrinology

## 2022-01-15 DIAGNOSIS — Z794 Long term (current) use of insulin: Secondary | ICD-10-CM

## 2022-01-15 DIAGNOSIS — E1165 Type 2 diabetes mellitus with hyperglycemia: Secondary | ICD-10-CM

## 2022-01-17 ENCOUNTER — Other Ambulatory Visit: Payer: Self-pay | Admitting: Medical

## 2022-01-27 ENCOUNTER — Telehealth: Payer: Self-pay

## 2022-01-27 ENCOUNTER — Other Ambulatory Visit: Payer: Self-pay

## 2022-01-27 DIAGNOSIS — E1165 Type 2 diabetes mellitus with hyperglycemia: Secondary | ICD-10-CM

## 2022-01-27 MED ORDER — SYNJARDY XR 5-1000 MG PO TB24: 2.0000 | ORAL_TABLET | Freq: Every day | ORAL | 2 refills | Status: DC

## 2022-01-27 NOTE — Telephone Encounter (Signed)
Rx sent in to pharmacy. 

## 2022-01-27 NOTE — Telephone Encounter (Signed)
Patients daughter requesting refill for patient medication  ? ?SYNJARDY XR 03-999 MG TB24 ? ?[id number] - [place], [place] - [address] AT [facility] OF [place] [address] ? ? ? ?Please advise  ?

## 2022-02-11 ENCOUNTER — Encounter: Payer: Self-pay | Admitting: Physician Assistant

## 2022-02-11 ENCOUNTER — Other Ambulatory Visit: Payer: Medicare Other

## 2022-02-11 ENCOUNTER — Ambulatory Visit (INDEPENDENT_AMBULATORY_CARE_PROVIDER_SITE_OTHER): Payer: Medicare Other | Admitting: Physician Assistant

## 2022-02-11 ENCOUNTER — Ambulatory Visit (INDEPENDENT_AMBULATORY_CARE_PROVIDER_SITE_OTHER): Payer: Medicare Other

## 2022-02-11 DIAGNOSIS — Z96652 Presence of left artificial knee joint: Secondary | ICD-10-CM

## 2022-02-11 DIAGNOSIS — M1711 Unilateral primary osteoarthritis, right knee: Secondary | ICD-10-CM

## 2022-02-11 MED ORDER — DICLOFENAC SODIUM 75 MG PO TBEC: DELAYED_RELEASE_TABLET | ORAL | 1 refills | Status: DC

## 2022-02-11 NOTE — Progress Notes (Signed)
? ? ? ? ? ? ? ? ? ? ? ? ? ? ? ? ? ? ? ? ? ? ? ? ? ? ? ? ? ? ? ? ? ? ? ? ? ? ? ? ? ? ? ? ? ? ? ? ? ? ? ? ? ? ? ? ? ? ? ? ? ? ? ? ? ? ? ? ? ? ? ? ? ? ? ? ? ? ? ? ? ? ? ? ? ? ? ? ? ? ? ? ? ? ? ? ? ? ? ? ? ? ? ? ? ? ? ? ? ? ? ? ? ? ? ? ? ? ? ? ? ? ? ? ? ? ? ? ? ? ? ? ? ? ? ? ? ? ? ? ? ? ? ? ? ? ? ? ? ? ? ? ? ? ? ? ? ? ? ? ? ? ? ? ? ? ? ? ? ? ? ? ? ? ? ? ? ? ? ? ? ? ? ? ? ? ? ? ? ? ? ? ? ? ? ? ? ? ? ? ? ? ? ? ? ? ? ? ? ? ? ? ? ? ? ? ? ? ? ? ? ? ? ? ? ? ? ? ? ? ? ? ? ? ? ? ? ? ? ? ? ? ? ? ? ? ? ? ? ? ? ? ? ? ? ? ? ? ? ? ? ? ? ? ? ? ? ? ? ? ? ? ? ? ? ? ? ? ? ? ? ? ? ? ? ? ? ? ? ? ? ? ? ? ? ? ? ? ? ? ? ? ? ? ? ? ? ? ? ? ? ? ? ? ? ? ? ? ? ? ? ? ? ? ? ? ? ? ? ? ? ? ? ? ? ? ? ? ? ? ? ? ? ? ? ? ? ? ? ? ? ? ? ? ? ? ? ? ? ? ? ? ? ? ? ? ? ? ? ? ? ? ? ? ? ? ? ? ? ? ? ? ? ? ? ? ? ? ? ? ? ? ? ? ? ? ? ? ? ? ? ? ? ? ? ? ? ? ? ? ? ? ? ? ? ? ? ? ? ? ? ? ? ? ? ? ? ? ? ? ? ? ? ? ? ? ? ? ? ? ? ? ? ? ? ? ? ? ? ? ? ? ? ? ? ? ? ? ? ? ? ? ? ? ? ? ? ? ? ? ? ? ? ? ? ? ? ? ? ? ? ? ? ? ? ? ? ? ? ? ? ? ? ? ? ? ? ? ? ? ? ? ? ? ? ? ? ? ? ? ? ? ? ? ? ? ? ? ? ? ? ? ? ? ? ? ? ? ? ? ? ? ? ? ? ? ? ? ? ? ? ? ? ? ? ? ? ? ? ? ? ? ? ? ? ? ? ? ? ? ? ? ? ? ? ? ? ? ? ? ? ? ? ? ? ? ? ? ? ? ? ? ? ? ? ? ? ? ? ? ? ? ? ? ? ? ? ? ? ? ? ? ? ? ? ? ? ? ? ? ? ? ? ? ? ? ? ? ? ? ? ? ? ? ? ? ? ? ? ? ? ? ? ? ? ? ? ? ? ? ? ? ? ? ? ? ? ? ? ? ? ? ? ? ? ? ? ? ? ? ? ? ? ? ? ? ? ? ? ? ? ? ? ? ? ? ? ? ? ? ? ? ? ? ? ? ? ? ? ? ? ? ? ? ? ? ? ? ? ? ? ? ? ? ? ? ? ? ? ? ? ? ? ? ? ? ? ? ? ? ? ? ? ? ? ? ? ? ? ? ? ? ? ? ? ? ? ? ? ? ? ? ? ? ? ? ? ? ? ? ? ? ? ? ? ? ? ? ? ? ? ? ? ? ? ? ? ? ? ? ? ? ? ? ? ? ? ? ? ? ? ? ? ? ? ? ? ? ? ? ? ? ? ? ? ? ? ? ? ? ? ? ? ? ? ? ? ? ? ? ? ? ? ? ? ? ? ? ? ? ? ? ? ? ? ? ? ? ? ? ? ? ? ?+----------------------------------------------------------------------------------------------------------------------- ? ? ? ? ? ? ? ? ? ? ? ? ? ? ? ? ? ? ? ? ? ? ? ? ? ? ? ? ? ? ? ? ? ? ? ? ? ? ? ? ? ? ? ? ? ? ? ? ? ? ? ? ? ? ? ? ? ? ? ? ? ? ? ? ? ?  Office Visit  Note ?  ?Patient: Samuel [name]           ?Date of Birth: 08/23/1951           ?MRN: [id number] ?Visit Date: 02/11/2022 ?             ?Requested by: Esperanza Richters, PA-C ?[address] ?[address] ?[place],  [place] [address] ?PCP: Esperanza Richters, PA-C ? ? ?Assessment & Plan: ?Visit Diagnoses:  ?1. Status post total left knee replacement   ?2. Unilateral primary osteoarthritis, right knee   ? ? ?Plan:  ?He would like to schedule surgery on his right knee to be sometime around the end of September therefore we will have him back in August of this year to talk about right knee surgery.  In the interim we will have him continue the oral and topical diclofenac.  He will continue to work on quad strengthening bilaterally.  Questions were encouraged and answered at length today.  An interpreter was used to communicate with patient today. ? ?Follow-Up Instructions: Return in about 4 months (around 06/13/2022).  ? ?Orders:  ?Orders Placed This Encounter  ?Procedures  ? XR Knee 1-2 Views Left  ? ?Meds ordered this encounter  ?Medications  ? diclofenac (VOLTAREN) 75 MG EC tablet  ?  Sig: TAKE 1 TABLET(75 MG) BY MOUTH TWICE DAILY  ?  Dispense:  60 tablet  ?  Refill:  1  ? ? ? ? Procedures: ?No procedures performed ? ? ?Clinical Data: ?No additional findings. ? ? ?Subjective: ?Chief Complaint  ?Patient presents with  ? Left Knee - Routine Post Op  ? ? ?HPI ?Mr. Mccaster returns today status post left total knee arthroplasty 08/07/2021.  He states left knee is doing well.  He has no complaints or pain in regards to the left knee.  His main complaint today is right knee pain.  He states his pain is 4 out of 10 at worst.  The knee gives way.  He is using Voltaren gel on the knee once daily.  He is asking about oral medications that he can take for the knee pain.  He has had no new injuries to the knee.  He is diabetic last hemoglobin A1c 2 months ago was 7.0. ?Review of Systems  ?Constitutional:  Negative for chills and fever.   ? ? ?Objective: ?Vital Signs: There were no vitals taken for this visit. ? ?Physical Exam ?Constitutional:   ?   Appearance: He is not ill-appearing or diaphoretic.  ?Neurological:  ?   Mental Status: He is alert and oriented to person, place, and time.  ?Psychiatric:     ?   Mood and Affect: Mood normal.  ? ? ?Ortho Exam ?Left knee full extension full flexion slight laxity with valgus stressing.  Otherwise no laxity of the knee.  Surgical incisions well-healed.  Calf supple nontender. ?Right knee slight hyperextension flexion to approximately 110 degrees.  Passive range of motion reveals patellofemoral crepitus.  Tenderness along medial joint line.  No abnormal warmth erythema or effusion right knee.  Slight valgus deformity. ?Specialty Comments:  ?No specialty comments available. ? ?Imaging: ?XR Knee 1-2 Views Left ? ?Result Date: 02/11/2022 ?Left knee 2 views: Status post left total knee arthroplasty well-seated components.  No acute fractures or findings.  Knee is well located.  ? ? ?PMFS History: ?Patient Active Problem List  ? Diagnosis Date Noted  ? Status post left knee replacement 08/07/2021  ? Unilateral primary osteoarthritis, left  knee 02/02/2021  ? Lumbar stenosis with neurogenic claudication 07/24/2018  ? Colon cancer screening 08/03/2016  ? Low back pain 05/24/2016  ? Bilateral knee pain 05/24/2016  ? Pain of right heel 05/01/2015  ? 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 Cough 01/02/2015  ? Diabetes (HCC) 10/02/2014  ? Essential hypertension, benign 06/17/2014  ? Type II or unspecified type diabetes mellitus without mention of complication, not stated as uncontrolled 06/17/2014  ? Hyperlipidemia 06/17/2014  ? Hip pain 06/17/2014  ? Back pain 06/17/2014  ? Frequent urination 06/17/2014  ? Unspecified asthma(493.90) 06/17/2014  ? Asthma 06/17/2014  ? Diabetes mellitus type 2, uncontrolled 01/26/2012  ? ?Past Medical History:  ?Diagnosis Date  ? Blood transfusion without  reported diagnosis   ? Diabetes mellitus without complication (HCC)   ? type II  ? History of ETOH abuse   ? Hypertension   ?  ?Family History  ?Problem Relation Age of Onset  ? Diabetes Unknown   ? Hypertension Unknown   ? Arthritis Father   ? Diabetes Father   ?  ?Past Surgical History:  ?Procedure Laterality Date  ? BACK SURGERY N/A 2010  ? 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  ? Procedure: LEFT TOTAL KNEE ARTHROPLASTY;  Surgeon: Kathryne Hitch, MD;  Location: [facility];  Service: Orthopedics;  Laterality: Left;  ? ?Social History  ? ?Occupational History  ? Not on file  ?Tobacco Use  ? Smoking status: Former  ? Smokeless tobacco: Former  ?  Quit date: 11/08/1993  ?Vaping Use  ? Vaping Use: Never used  ?Substance and Sexual Activity  ? Alcohol use: No  ? Drug use: No  ? Sexual activity: Not on file  ? ? ? ? ? ? ?

## 2022-02-18 ENCOUNTER — Ambulatory Visit: Payer: Medicare Other | Admitting: Endocrinology

## 2022-02-18 ENCOUNTER — Encounter: Payer: Self-pay | Admitting: Endocrinology

## 2022-02-18 VITALS — BP 142/70 | HR 66 | Ht 63.0 in | Wt 207.8 lb

## 2022-02-18 DIAGNOSIS — Z794 Long term (current) use of insulin: Secondary | ICD-10-CM | POA: Diagnosis not present

## 2022-02-18 DIAGNOSIS — E1165 Type 2 diabetes mellitus with hyperglycemia: Secondary | ICD-10-CM

## 2022-02-18 MED ORDER — OMNIPOD DASH INTRO (GEN 4) KIT: PACK | 0 refills | Status: DC

## 2022-02-18 MED ORDER — OMNIPOD DASH PODS (GEN 4) MISC: 5 refills | Status: DC

## 2022-02-18 NOTE — Patient Instructions (Signed)
Change pump time ? ?Basal now 1.7 from 4-8 am and then 1.60 ?

## 2022-02-18 NOTE — Progress Notes (Signed)
Patient ID: Samuel [name], male   DOB: 12/18/1950, 71 y.o.   MRN: [id number] ? ? ?       ? ? ?Reason for Appointment:  Followup for Type 2 Diabetes ? ?Referring physician: Saguier ? ?History of Present Illness:  ?        ?Diagnosis: Type 2 diabetes mellitus, date of diagnosis:  1990     ? ?Past history: He thinks he has been on insulin for the last 10-12 years. Previously had been on metformin which has been continued. He was probably tried on different insulin regimens initially but has been taking 70/30 mostly because of cost ?Previous records are not available and appears that his sugars have been poorly controlled for several years ?He was  referred here because of an A1c in 8/15 of 9.7%. ?He was on a regimen of Novolin 70/30 twice a day but because of inadequate control and higher readings later in the day he was switched to basal bolus insulin regimen in 12/15. ?Previous insulin regimen: Humalog  ac meals 35-40-30 Toujeo 35-40 units daily ? ?Recent history:  ? ?INSULIN regimen is: OMNIPOD INSULIN PUMP ? ?Basal rate settings: Midnight = 0.9, 6am: 1.5.  Boluses 12 units breakfast -14 units lunch and 18 units at dinner at meals ?Total daily insulin up to 80 units ? ?Carbohydrate coverage 1:10 and correction 1:50 ? ? ?Non-insulin hypoglycemic drugs:   Trulicity 1.5 mg weekly, Synjardy XR 03/999, 2 tablets daily ? ?A1c is last 7.0 and improved ? ? ?Current management, blood sugar patterns and problems identified: ?He apparently got a new pump replacement and his bolus calculator has not been set up  ?Also on his last visit his insulin pump was not giving him any basal insulin except for 0.05  ?Even with this his fasting blood sugars were fairly good at 136 and [place]al blood sugars lower than 160  ?Currently because of not having the bolus calculator set up he is taking bolus insulin empirically only when the blood sugar is high  ?With this he appears not to be bolusing BEFORE meals consistently ?Today with not  bolusing before lunch his blood sugar is now 260 postprandially  ?At times he has taken about 5 boluses a day ranging from 12 to 18 units each time without hypoglycemia  ?Also from his CGM he appears to have a dawn phenomenon with blood sugars gradually rising around 4 AM until breakfast  ?Because of lack of mealtime insulin at times he has had periodic readings over 200 especially at breakfast and sometimes after lunch  ?Only occasionally will have low normal readings at random times ?Unclear how accurate his freestyle libre as compared to fingersticks ? ?His mealtimes are generally variable, evening meal 5-7 PM ?      ?Side effects from medications have been: ?  Nausea/dizziness on Victoza ? ? ?CGM use % of time   ?2-week average/GV 150  ?Time in range     83   %  ?% Time Above 180 15+2  ?% Time above 250   ?% Time Below 70   ? ?  ?PRE-MEAL Fasting Lunch Dinner Bedtime Overall  ?Glucose range:       ?Averages: 141 148 140 146   ? ?POST-MEAL PC Breakfast PC Lunch PC Dinner  ?Glucose range:     ?Averages: 182 154 158  ? ?Previously: ? ?CGM use % of time 96  ?2-week average/GV 149  ?Time in range    83    %  ?%  Time Above 180 17  ?% Time above 250   ?% Time Below 70   ? ?  ?PRE-MEAL Fasting Lunch Dinner Bedtime Overall  ?Glucose range:       ?Averages: 136 143 159 160 149  ? ?POST-MEAL PC Breakfast PC Lunch PC Dinner  ?Glucose range:     ?Averages: 170 153 167  ? ? ?Prior: ? ? ?PRE-MEAL Fasting Lunch Dinner Bedtime Overall  ?Glucose range:     50-284  ?Mean/median: [id number]  ? ?POST-MEAL PC Breakfast PC Lunch PC Dinner  ?Glucose range:     ?Mean/median:   171  ? ?   ?Dietician visit, most recent: 5/16 ?Last CDE visit: 12/2016           ? ?Weight history: Previous range 200-220 ? ?Wt Readings from Last 3 Encounters:  ?02/18/22 207 lb 12.8 oz (94.3 kg)  ?01/11/22 206 lb (93.4 kg)  ?12/17/21 209 lb 9.6 oz (95.1 kg)  ? ? ?Glycemic control: ?  ?Lab Results  ?Component Value Date  ? HGBA1C 7.0 (A) 12/10/2021  ?  HGBA1C 7.3 (H) 06/22/2021  ? HGBA1C 7.1 (A) 03/19/2021  ? ?Lab Results  ?Component Value Date  ? MICROALBUR <0.7 12/16/2020  ? LDLCALC 44 10/30/2021  ? CREATININE 0.76 10/30/2021  ? ? ?Lab Results  ?Component Value Date  ? FRUCTOSAMINE 302 (H) 07/20/2017  ?  ? ? ? ?Allergies as of 02/18/2022   ?No Known Allergies ?  ? ?  ?Medication List  ?  ? ?  ? Accurate as of February 18, 2022  1:51 PM. If you have any questions, ask your nurse or doctor.  ?  ?  ? ?  ? ?aspirin 81 MG chewable tablet ?Chew 1 tablet (81 mg total) by mouth 2 (two) times daily. ?  ?atorvastatin 40 MG tablet ?Commonly known as: LIPITOR ?TAKE 1 TABLET BY MOUTH DAILY THEN TAKE 1/2 TABLET BY MOUTH AT BEDTIME ?What changed: See the new instructions. ?  ?ciprofloxacin 500 MG tablet ?Commonly known as: CIPRO ?Take 1 tablet (500 mg total) by mouth 2 (two) times daily. ?  ?diclofenac 75 MG EC tablet ?Commonly known as: VOLTAREN ?TAKE 1 TABLET(75 MG) BY MOUTH TWICE DAILY ?  ?famotidine 20 MG tablet ?Commonly known as: PEPCID ?Take 1 tablet (20 mg total) by mouth daily. ?  ?FREESTYLE LITE test strip ?Generic drug: glucose blood ?USE AS INSTRUCTED FOUR TIMES DAILY ?  ?gabapentin 100 MG capsule ?Commonly known as: NEURONTIN ?TAKE 1 CAPSULE(100 MG) BY MOUTH THREE TIMES DAILY ?  ?HYDROcodone-acetaminophen 5-325 MG tablet ?Commonly known as: NORCO/VICODIN ?Take 1-2 tablets by mouth every 6 (six) hours as needed for moderate pain. ?  ?insulin lispro 100 UNIT/ML injection ?Commonly known as: HumaLOG ?INJECT UP TO 90 UNITS UNDER THE SKIN DAILY VIA INSULIN PUMP ?  ?Insulin Pen Needle 32G X 4 MM Misc ?Use 5 pens per day to inject insulin ?  ?B-D UF III MINI PEN NEEDLES 31G X 5 MM Misc ?Generic drug: Insulin Pen Needle ?USE FIVE PER DAY AS DIRECTED ?  ?INSULIN SYRINGE .5CC/30GX5/16" 30G X 5/16" 0.5 ML Misc ?Use 3 times a day for insulin ?  ?losartan 50 MG tablet ?Commonly known as: COZAAR ?TAKE 1 TABLET(50 MG) BY MOUTH DAILY ?  ?methocarbamol 500 MG tablet ?Commonly known  as: ROBAXIN ?TAKE 1 TABLET(500 MG) BY MOUTH EVERY 6 HOURS AS NEEDED FOR MUSCLE SPASMS ?  ?NON FORMULARY ?Take 2 capsules by mouth daily. 4LIFE TRANSFER FACTORY CARDIO ?  ?  Omnipod Classic Pods (Gen 3) Misc ?CHANGE EVERY 48 HOURS AS DIRECTED ?  ?OVER THE COUNTER MEDICATION ?Take 2 tablets by mouth daily. 4LIFE TRANSFER FACTORY RECALL ?  ?Synjardy XR 03-999 MG Tb24 ?Generic drug: Empagliflozin-metFORMIN HCl ER ?Take 2 tablets by mouth daily. ?  ?Toujeo SoloStar 300 UNIT/ML Solostar Pen ?Generic drug: insulin glargine (1 Unit Dial) ?ADMINISTER 40 UNITS UNDER THE SKIN DAILY ?  ?Trulicity 1.5 0000000 Sopn ?Generic drug: Dulaglutide ?ADMINISTER 1.5 MG UNDER THE SKIN 1 TIME WEEKLY AS DIRECTED ?  ? ?  ? ? ?Allergies: No Known Allergies ? ?Past Medical History:  ?Diagnosis Date  ? Blood transfusion without reported diagnosis   ? Diabetes mellitus without complication ([place])   ? type II  ? History of ETOH abuse   ? Hypertension   ? ? ?Past Surgical History:  ?Procedure Laterality Date  ? BACK SURGERY N/A 2010  ? 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  ? Procedure: LEFT TOTAL KNEE ARTHROPLASTY;  Surgeon: Mcarthur Rossetti, MD;  Location: [facility];  Service: Orthopedics;  Laterality: Left;  ? ? ?Family History  ?Problem Relation Age of Onset  ? Diabetes Unknown   ? Hypertension Unknown   ? Arthritis Father   ? Diabetes Father   ? ? ?Social History:  reports that he has quit smoking. He quit smokeless tobacco use about 28 years ago. He reports that he does not drink alcohol and does not use drugs. ? ?  ?Review of Systems  ? ?  ?    Lipids:  He has been on Lipitor 40 mg from his PCP for hypercholesterolemia since about 2011, lipid levels as below ? ?  ?     ?Lab Results  ?Component Value Date  ? CHOL 118 10/30/2021  ? HDL 45.60 10/30/2021  ? LDLCALC 44 10/30/2021  ? LDLDIRECT 44.0 01/24/2020  ? TRIG 138.0 10/30/2021  ? CHOLHDL 3 10/30/2021  ? ? ?Hypertension: Has been treated with losartan 50 mg, prescribed by PCP ?Also on  Jardiance ? ? ?BP Readings from Last 3 Encounters:  ?02/18/22 (!) 142/70  ?01/11/22 135/82  ?12/17/21 (!) 146/70  ? ? ?Microalbumin normal as of 3/21 ? ? ?Physical Examination: ? ?BP (!) 142/70 (BP Location: Left

## 2022-04-11 ENCOUNTER — Other Ambulatory Visit: Payer: Self-pay | Admitting: Endocrinology

## 2022-04-16 ENCOUNTER — Telehealth: Payer: Self-pay

## 2022-04-16 NOTE — Telephone Encounter (Signed)
ADS requested most recent chart notes be faxed. Faxed to [phone] electronically from chart.

## 2022-04-19 ENCOUNTER — Other Ambulatory Visit: Payer: Self-pay | Admitting: Endocrinology

## 2022-05-24 ENCOUNTER — Encounter: Payer: Self-pay | Admitting: Endocrinology

## 2022-05-24 ENCOUNTER — Ambulatory Visit: Payer: Medicare Other | Admitting: Endocrinology

## 2022-05-24 VITALS — BP 128/70 | HR 71 | Ht 63.0 in | Wt 207.8 lb

## 2022-05-24 DIAGNOSIS — I10 Essential (primary) hypertension: Secondary | ICD-10-CM

## 2022-05-24 DIAGNOSIS — Z794 Long term (current) use of insulin: Secondary | ICD-10-CM

## 2022-05-24 DIAGNOSIS — E1165 Type 2 diabetes mellitus with hyperglycemia: Secondary | ICD-10-CM | POA: Diagnosis not present

## 2022-05-24 LAB — POCT GLYCOSYLATED HEMOGLOBIN (HGB A1C): Hemoglobin A1C: 7.4 % — AB (ref 4.0–5.6)

## 2022-05-24 MED ORDER — FREESTYLE LIBRE 2 SENSOR MISC: 2.0000 | 3 refills | Status: DC

## 2022-05-24 MED ORDER — FREESTYLE LIBRE 2 READER DEVI: 1.0000 | Freq: Once | 0 refills | Status: AC

## 2022-05-24 MED ORDER — TRULICITY 3 MG/0.5ML ~~LOC~~ SOAJ: 3.0000 mg | SUBCUTANEOUS | 5 refills | Status: DC

## 2022-05-24 NOTE — Progress Notes (Signed)
Patient ID: Samuel [name], male   DOB: [birth year], 71 y.o.   MRN: [id number]            Reason for Appointment:  Followup for Type 2 Diabetes   History of Present Illness:          Diagnosis: Type 2 diabetes mellitus, date of diagnosis:  1990      Past history: He thinks he has been on insulin for the last 10-12 years. Previously had been on metformin which has been continued. He was probably tried on different insulin regimens initially but has been taking 70/30 mostly because of cost Previous records are not available and appears that his sugars have been poorly controlled for several years He was  referred here because of an A1c in 8/15 of 9.7%. He was on a regimen of Novolin 70/30 twice a day but because of inadequate control and higher readings later in the day he was switched to basal bolus insulin regimen in 12/15. Previous insulin regimen: Humalog  ac meals 35-40-30 Toujeo 35-40 units daily  Recent history:   INSULIN regimen is: OMNIPOD INSULIN PUMP  Basal rate settings: Midnight = 0.9, 4am: 1.7. 8 a.m. = 1.6 Boluses 12 units breakfast -14 units lunch and 18 units at dinner at meals Total daily insulin up to 100 units  Carbohydrate coverage 1:10 and correction 1:50   Non-insulin hypoglycemic drugs:   Trulicity 1.5 mg weekly, Synjardy XR 03/999, 2 tablets daily  A1c is 7.4 compared to 7%   Current management, blood sugar patterns and problems identified: He still does not enter his blood sugars or carbohydrates when he is bolusing Difficult to know how he is planning his boluses as he is stating that sometimes he will bolus only half his bolus amount of about 10 units before starting to eat and another 8 to 10 units if blood sugars are going up after the meal However occasionally has 3 consecutive boluses within a couple of hours and he says this is because sugars are not coming down With this his blood sugars may be low normal at times before lunch or bedtime but not  hypoglycemic Currently requiring much more and bolus insulin compared to basal insulin and only 36% with basal insulin He is not able to do much walking because of knee pain Even with adjusting his basal rates he still has significant dawn phenomenon and may take some boluses around 5 AM if blood sugars are higher, he thinks his breakfast is after 8 AM usually Although he thinks he has a fair amount of satiety from Trulicity he thinks he can benefit from a higher dose  His mealtimes are generally variable, evening meal 5-7 PM       Side effects from medications have been: ?  Nausea/dizziness on Victoza  Interpretation of the download from his freestyle libre 14-day CGM shows the following  His blood sugars are showing mild hyperglycemia at most times with lowest readings between 11 PM and about 2 AM and HIGHEST readings in the mornings between 6-8 AM OVERNIGHT blood sugars are somewhat variable but usually start rising after about 2-3 AM until breakfast time, may be mildly increased at midnight to start with.  Overnight average blood sugars are about 135 before 4 AM POSTPRANDIAL readings are mostly higher after breakfast although he appears to have a dawn phenomenon before eating breakfast also Blood sugars after lunch are mostly well controlled with an average rise of less than 50 After dinner blood sugars  rise is also within 30-40 points on average Pre-meal readings may tend to be higher before dinner No hypoglycemia  CGM use % of time 98  2-week average/GV 158/27  Time in range    72   %  % Time Above 180 26+2  % Time above 250   % Time Below 70 0     PRE-MEAL Fasting Lunch Dinner Bedtime Overall  Glucose range:       Averages: 150    158   POST-MEAL PC Breakfast PC Lunch PC Dinner  Glucose range:     Averages: 184 176 176    Prior   CGM use % of time   2-week average/GV 150  Time in range     83   %  % Time Above 180 15+2  % Time above 250   % Time Below 70       PRE-MEAL Fasting Lunch Dinner Bedtime Overall  Glucose range:       Averages: 141 148 140 146    POST-MEAL PC Breakfast PC Lunch PC Dinner  Glucose range:     Averages: 182 154 158       Dietician visit, most recent: 5/16 Last CDE visit: 12/2016            Weight history: Previous range 200-220  Wt Readings from Last 3 Encounters:  05/24/22 207 lb 12.8 oz (94.3 kg)  02/18/22 207 lb 12.8 oz (94.3 kg)  01/11/22 206 lb (93.4 kg)    Glycemic control:   Lab Results  Component Value Date   HGBA1C 7.4 (A) 05/24/2022   HGBA1C 7.0 (A) 12/10/2021   HGBA1C 7.3 (H) 06/22/2021   Lab Results  Component Value Date   MICROALBUR <0.7 12/16/2020   LDLCALC 44 10/30/2021   CREATININE 0.76 10/30/2021    Lab Results  Component Value Date   FRUCTOSAMINE 302 (H) 07/20/2017       Allergies as of 05/24/2022   No Known Allergies      Medication List        Accurate as of May 24, 2022  5:00 PM. If you have any questions, ask your nurse or doctor.          STOP taking these medications    Trulicity 1.5 MV/6.7MC Sopn Generic drug: Dulaglutide Replaced by: Trulicity 3 NO/7.0JG Sopn Stopped by: Elayne Snare, MD       TAKE these medications    aspirin 81 MG chewable tablet Chew 1 tablet (81 mg total) by mouth 2 (two) times daily.   atorvastatin 40 MG tablet Commonly known as: LIPITOR TAKE 1 TABLET BY MOUTH DAILY THEN TAKE 1/2 TABLET BY MOUTH AT BEDTIME What changed: See the new instructions.   ciprofloxacin 500 MG tablet Commonly known as: CIPRO Take 1 tablet (500 mg total) by mouth 2 (two) times daily.   diclofenac 75 MG EC tablet Commonly known as: VOLTAREN TAKE 1 TABLET(75 MG) BY MOUTH TWICE DAILY   famotidine 20 MG tablet Commonly known as: PEPCID Take 1 tablet (20 mg total) by mouth daily.   FreeStyle Libre 2 Reader Devi 1 Device by Does not apply route once for 1 dose. Started by: Elayne Snare, MD   FreeStyle Libre 2 Sensor Misc 2 Devices by Does not  apply route every 14 (fourteen) days. Started by: Elayne Snare, MD   FREESTYLE LITE test strip Generic drug: glucose blood USE AS INSTRUCTED FOUR TIMES DAILY   gabapentin 100 MG capsule Commonly known as: NEURONTIN TAKE  1 CAPSULE(100 MG) BY MOUTH THREE TIMES DAILY   HYDROcodone-acetaminophen 5-325 MG tablet Commonly known as: NORCO/VICODIN Take 1-2 tablets by mouth every 6 (six) hours as needed for moderate pain.   insulin lispro 100 UNIT/ML injection Commonly known as: HumaLOG INJECT UP TO 90 UNITS UNDER THE SKIN VIA INSULIN PUMP DAILY   Insulin Pen Needle 32G X 4 MM Misc Use 5 pens per day to inject insulin   B-D UF III MINI PEN NEEDLES 31G X 5 MM Misc Generic drug: Insulin Pen Needle USE FIVE PER DAY AS DIRECTED   INSULIN SYRINGE .5CC/30GX5/16" 30G X 5/16" 0.5 ML Misc Use 3 times a day for insulin   losartan 50 MG tablet Commonly known as: COZAAR TAKE 1 TABLET(50 MG) BY MOUTH DAILY   methocarbamol 500 MG tablet Commonly known as: ROBAXIN TAKE 1 TABLET(500 MG) BY MOUTH EVERY 6 HOURS AS NEEDED FOR MUSCLE SPASMS   NON FORMULARY Take 2 capsules by mouth daily. 4LIFE TRANSFER FACTORY CARDIO   Omnipod Classic Pods (Gen 3) Misc CHANGE EVERY 48 HOURS AS DIRECTED   Omnipod DASH Intro (Gen 4) Kit Use for insulin pumping   Omnipod DASH Pods (Gen 4) Misc Use for insulin pump every 2 days   OVER THE COUNTER MEDICATION Take 2 tablets by mouth daily. 4LIFE TRANSFER FACTORY RECALL   Synjardy XR 03-999 MG Tb24 Generic drug: Empagliflozin-metFORMIN HCl ER Take 2 tablets by mouth daily.   Toujeo SoloStar 300 UNIT/ML Solostar Pen Generic drug: insulin glargine (1 Unit Dial) ADMINISTER 40 UNITS UNDER THE SKIN DAILY   Trulicity 3 GN/0.0BB Sopn Generic drug: Dulaglutide Inject 3 mg into the skin once a week. Replaces: Trulicity 1.5 CW/8.8QB Sopn Started by: Elayne Snare, MD        Allergies: No Known Allergies  Past Medical History:  Diagnosis Date   Blood  transfusion without reported diagnosis    Diabetes mellitus without complication ([place])    type II   History of ETOH abuse    Hypertension     Past Surgical History:  Procedure Laterality Date   BACK SURGERY N/A 2010   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   Procedure: LEFT TOTAL KNEE ARTHROPLASTY;  Surgeon: Mcarthur Rossetti, MD;  Location: [facility];  Service: Orthopedics;  Laterality: Left;    Family History  Problem Relation Age of Onset   Diabetes Unknown    Hypertension Unknown    Arthritis Father    Diabetes Father     Social History:  reports that he has quit smoking. He quit smokeless tobacco use about 28 years ago. He reports that he does not drink alcohol and does not use drugs.    Review of Systems         Lipids:  He has been on Lipitor 40 mg from his PCP for hypercholesterolemia since about 2011, lipid levels as below        Lab Results  Component Value Date   CHOL 118 10/30/2021   HDL 45.60 10/30/2021   LDLCALC 44 10/30/2021   LDLDIRECT 44.0 01/24/2020   TRIG 138.0 10/30/2021   CHOLHDL 3 10/30/2021    Hypertension: Controlled Has been treated with losartan 50 mg, prescribed by PCP Also on Jardiance   BP Readings from Last 3 Encounters:  05/24/22 128/70  02/18/22 (!) 142/70  01/11/22 135/82    Microalbumin needs to be rechecked today   Physical Examination:  BP 128/70 (BP Location: Left Arm, Patient Position: Sitting, Cuff Size: Normal)   Pulse 71  Ht '5\' 3"'  (1.6 m)   Wt 207 lb 12.8 oz (94.3 kg)   SpO2 96%   BMI 36.81 kg/m   No ankle edema present  ASSESSMENT:  Diabetes type 2, with obesity and BMI of around 36  See history of present illness for  description of current diabetes management, blood sugar patterns and problems identified..  His diabetes is being managed with Humalog in the Omnipod 3 insulin pump, Synjardy and Trulicity 1.5 mg  His A5W is 7.4  Explained to him that he needs to stop stacking boluses as this is  causing low normal sugars subsequently and then rebounding Again explained to him that his boluses for meals should be taken just before mealtime even if the blood sugar is normal because he is covering his food intake Also he does tend to have a rise in blood sugar early morning despite adjusting his basal settings on the last visit  Unable to count carbohydrates  Currently using a freestyle libre and needs to upgrade to the [place] 2 blood sugar at least  Also his not capable of changing his pump settings himself Not clear why he is not upgrading to the newer OmniPod systems  HYPERTENSION: Mild and well controlled    PLAN:   Will need to increase his early morning basal rate as he has a dawn phenomenon   Because a basal rate for his dawn phenomenon and will give him 1.7 units an hour from 4 AM to 8 AM  Programming was done for basal to be 1.6 the rest of the day and continue 0.9 from 12 AM-4 AM He will also need to periodically check fingerstick to compare with his freestyle libre  Discussed needing to take no more than 10 units as a correction dose for high sugars and will have improved and his blood sugar in order to do the correction   He will benefit from increase of the dose of Trulicity up to 3 mg and new prescription sent This will hopefully improve his need for mealtime insulin as well as promote some weight loss with his BMI nearly 37  No change in [place] but will need to follow-up renal function studies today   Today's visit including counseling and evaluation and management duration = 30 minutes  Patient Instructions  Bolus 18-20 for meals and only 8 or less for sugars staying high   Elayne Snare 05/24/2022, 5:00 PM   Note: This office note was prepared with Dragon voice recognition system technology. Any transcriptional errors that result from this process are unintentional. May

## 2022-05-24 NOTE — Patient Instructions (Signed)
Bolus 18-20 for meals and only 8 or less for sugars staying high

## 2022-05-25 LAB — COMPREHENSIVE METABOLIC PANEL
ALT: 24 U/L (ref 0–53)
AST: 22 U/L (ref 0–37)
Albumin: 4.6 g/dL (ref 3.5–5.2)
Alkaline Phosphatase: 57 U/L (ref 39–117)
BUN: 18 mg/dL (ref 6–23)
CO2: 30 mEq/L (ref 19–32)
Calcium: 9.5 mg/dL (ref 8.4–10.5)
Chloride: 104 mEq/L (ref 96–112)
Creatinine, Ser: 0.8 mg/dL (ref 0.40–1.50)
GFR: 89.42 mL/min (ref >60.00)
Glucose, Bld: 149 mg/dL — ABNORMAL HIGH (ref 70–99)
Potassium: 3.8 mEq/L (ref 3.5–5.1)
Sodium: 141 mEq/L (ref 135–145)
Total Bilirubin: 0.5 mg/dL (ref 0.2–1.2)
Total Protein: 7.2 g/dL (ref 6.0–8.3)

## 2022-05-25 LAB — MICROALBUMIN / CREATININE URINE RATIO
Creatinine,U: 94.7 mg/dL
Microalb Creat Ratio: 0.7 mg/g (ref 0.0–30.0)
Microalb, Ur: <0.7 mg/dL (ref 0.0–1.9)

## 2022-05-28 ENCOUNTER — Encounter: Payer: Self-pay | Admitting: Endocrinology

## 2022-06-02 ENCOUNTER — Encounter: Payer: Self-pay | Admitting: Endocrinology

## 2022-06-02 DIAGNOSIS — E1165 Type 2 diabetes mellitus with hyperglycemia: Secondary | ICD-10-CM

## 2022-06-03 ENCOUNTER — Other Ambulatory Visit: Payer: Self-pay

## 2022-06-03 DIAGNOSIS — E1165 Type 2 diabetes mellitus with hyperglycemia: Secondary | ICD-10-CM

## 2022-06-03 MED ORDER — OMNIPOD DASH INTRO (GEN 4) KIT: PACK | 0 refills | Status: AC

## 2022-06-03 MED ORDER — OMNIPOD DASH INTRO (GEN 4) KIT: PACK | 0 refills | Status: DC

## 2022-06-03 MED ORDER — OMNIPOD DASH PODS (GEN 4) MISC: 5 refills | Status: DC

## 2022-06-03 NOTE — Telephone Encounter (Signed)
Also, daughter states that Dad is injecting the amount of insulin that the omnipod classic is telling him to inject. Wants to know if that is ok.

## 2022-06-03 NOTE — Telephone Encounter (Signed)
Rx sent in to [facility] for dash. Tried calling daughter back but no answer. Left vm. What are the instructions for toujeo?

## 2022-06-14 ENCOUNTER — Ambulatory Visit: Payer: Medicare Other | Admitting: Physician Assistant

## 2022-06-17 ENCOUNTER — Encounter: Payer: Self-pay | Admitting: Endocrinology

## 2022-06-17 ENCOUNTER — Telehealth: Payer: Self-pay | Admitting: Nutrition

## 2022-06-17 NOTE — Telephone Encounter (Signed)
Samuel [name], this patient needs an appointment for help with his new diabetic pump. Says he has tried to call many times..Came to the practice asking for help. Told him you would reach out for an appointment.

## 2022-06-22 ENCOUNTER — Encounter: Payer: Medicare Other | Attending: Endocrinology | Admitting: Nutrition

## 2022-06-22 DIAGNOSIS — E1165 Type 2 diabetes mellitus with hyperglycemia: Secondary | ICD-10-CM | POA: Insufficient documentation

## 2022-06-22 NOTE — Progress Notes (Unsigned)
Patient was trained on the use of the Dash insulin pump with the help of an interpreter. Settings were transferred from his OmniPod:  Basal rate: MN: 0.9, 2:30AM; 2.0,  8AM: 1.65  I/C: 1, target: 120 with correction over 140, ISF: 25,  timing: 4.0 hours. He was shown how to bolus by putting in his insulin dose for his meals: 10u for breakfast and supper and 18u for Lunch, with a correction dose that is calculated by putting his blood sugar readings into the PDM calculator.  He redemonstrated this correctly X3 and reported good understanding that he does not do a correction dose in his head, but rather puts in the blood sugar reading into the bolus calculator.  He had no final questions.   He filled a pod with Novolog insulin and attached it to his upper left arm without difficulty and started his pod at 3:30PM.  His blood sugar was 195, and he was shown how to give a correction dose, since he had no IOB from his lunch insulin injection at 11:30 AM.  He was told to do this whenever his blood sugar is over 250, and he agreed to do this.   He reports that his daughter will read his starter booklet and interpret this for him, as well as the manual. We reviewed all topics on the checklist and he signed this as understanding all topics with no final questions.

## 2022-06-23 ENCOUNTER — Telehealth: Payer: Self-pay | Admitting: Nutrition

## 2022-06-23 NOTE — Patient Instructions (Signed)
REad over starter booklet and manual with help from your daughter Call help line if questions or problems with your pump.

## 2022-06-23 NOTE — Telephone Encounter (Signed)
Patient reports no difficulties with using the Dash pump.  Reports FBS today was126 and denies any lows or any blood sugars in the 200s.  He had no final questions.for me.  He was reminded to call the 800 help line if questions.  I also sent him a manual in spanish to his email today.  He was reminded to read this and let his daughter view it as well. He agreed to do this.

## 2022-07-06 ENCOUNTER — Other Ambulatory Visit: Payer: Self-pay | Admitting: Endocrinology

## 2022-07-06 DIAGNOSIS — E1165 Type 2 diabetes mellitus with hyperglycemia: Secondary | ICD-10-CM

## 2022-08-16 ENCOUNTER — Other Ambulatory Visit: Payer: Self-pay | Admitting: Medical

## 2022-08-16 ENCOUNTER — Telehealth: Payer: Self-pay | Admitting: Medical

## 2022-08-16 MED ORDER — ATORVASTATIN CALCIUM 40 MG PO TABS: ORAL_TABLET | ORAL | 1 refills | Status: DC

## 2022-08-16 NOTE — Telephone Encounter (Addendum)
Daughter called to follow up on refill for Atorvastatin. Pharmacy had sent something to our office and they hadn't heard back. Advised last refill in 2022. Please send refill:  [id number], [address] AT [facility] [address], [place] [address] Phone: [phone]     Rx sent to pharmacy.  Mackie Pai, PA-C

## 2022-08-16 NOTE — Telephone Encounter (Signed)
Can you get pt scheduled for folllow up. Atorvastatin  sent to pharmacy.

## 2022-08-17 NOTE — Telephone Encounter (Signed)
Called patient at home and cell but no answer and no voice mail

## 2022-08-17 NOTE — Telephone Encounter (Signed)
Appt made for 10/13.

## 2022-08-20 ENCOUNTER — Ambulatory Visit (INDEPENDENT_AMBULATORY_CARE_PROVIDER_SITE_OTHER): Payer: Medicare Other | Admitting: Medical

## 2022-08-20 VITALS — BP 112/66 | HR 68 | Temp 98.2°F | Resp 18 | Ht 63.0 in | Wt 205.6 lb

## 2022-08-20 DIAGNOSIS — Z794 Long term (current) use of insulin: Secondary | ICD-10-CM

## 2022-08-20 DIAGNOSIS — I10 Essential (primary) hypertension: Secondary | ICD-10-CM | POA: Diagnosis not present

## 2022-08-20 DIAGNOSIS — E785 Hyperlipidemia, unspecified: Secondary | ICD-10-CM

## 2022-08-20 DIAGNOSIS — E119 Type 2 diabetes mellitus without complications: Secondary | ICD-10-CM

## 2022-08-20 MED ORDER — NYSTATIN-TRIAMCINOLONE 100000-0.1 UNIT/GM-% EX OINT: 1.0000 | TOPICAL_OINTMENT | Freq: Two times a day (BID) | CUTANEOUS | 1 refills | Status: DC

## 2022-08-20 NOTE — Progress Notes (Signed)
Subjective:    Patient ID: Samuel [name], male    DOB: 1951/05/26, 71 y.o.   MRN: [id number]  HPI   Htn- bp well controlled today. Pt is on losartan 100 mg daily.   High cholesterol- on atorvastatin 40 mg daily. Last lipid panel in 10-30-21. Pt ate today.  Diabetic. Last a1c in July was 7.4.  Continue  humalog in the Omnipod 3 insulin pump, Synjardy and Trulicity 1.5 mg. Upcoming appt with [name] per pt.  Pt wants flu vaccine today. Pt only got 2 covid vaccines in past. He had covid in past and does not have plan to get further.   Pt had bilateral upper arm rash for 2 weeks. Pt state some improvement over past 2 weeks. He states some yard work occasional. Pt has only used calamine lotion.        Review of Systems  Constitutional:  Negative for chills, fatigue and fever.  HENT:  Negative for congestion.   Respiratory:  Negative for cough, chest tightness, shortness of breath and wheezing.   Cardiovascular:  Negative for chest pain and palpitations.  Gastrointestinal:  Negative for abdominal pain, diarrhea, nausea and rectal pain.  Genitourinary:  Negative for dysuria, flank pain, frequency, penile pain and testicular pain.  Musculoskeletal:  Negative for back pain, joint swelling and neck stiffness.    Past Medical History:  Diagnosis Date   Blood transfusion without reported diagnosis    Diabetes mellitus without complication ([place])    type II   History of ETOH abuse    Hypertension      Social History   Socioeconomic History   Marital status: Married    Spouse name: Not on file   Number of children: Not on file   Years of education: Not on file   Highest education level: Not on file  Occupational History   Not on file  Tobacco Use   Smoking status: Former   Smokeless tobacco: Former    Quit date: 11/08/1993  Vaping Use   Vaping Use: Never used  Substance and Sexual Activity   Alcohol use: No   Drug use: No   Sexual activity: Not on file  Other  Topics Concern   Not on file  Social History Narrative   Not on file   Social Determinants of Health   Financial Resource Strain: Not on file  Food Insecurity: Not on file  Transportation Needs: Not on file  Physical Activity: Not on file  Stress: Not on file  Social Connections: Not on file  Intimate Partner Violence: Not on file    Past Surgical History:  Procedure Laterality Date   BACK SURGERY N/A 2010   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 60 tablet 0   atorvastatin (LIPITOR) 40 MG tablet 1 tab po am and 1/2 tab po q day 135 tablet 1   B-D UF III MINI PEN NEEDLES 31G X 5 MM MISC USE FIVE PER DAY  AS DIRECTED 200 each 0   Continuous Blood Gluc Sensor (FREESTYLE LIBRE 2 SENSOR) MISC 2 Devices by Does not apply route every 14 (fourteen) days. 2 each 3   diclofenac (VOLTAREN) 75 MG EC tablet TAKE 1 TABLET(75 MG) BY MOUTH TWICE DAILY 60 tablet 1   Dulaglutide (TRULICITY) 3 QJ/1.9ER SOPN Inject 3 mg into the skin once a week. 2 mL 5   famotidine (PEPCID) 20 MG tablet Take 1 tablet (20 mg total) by mouth daily. 30 tablet 0   gabapentin (NEURONTIN) 100 MG capsule TAKE 1 CAPSULE(100 MG) BY MOUTH THREE TIMES DAILY 90 capsule 0   glucose blood (FREESTYLE LITE) test strip USE AS INSTRUCTED FOUR TIMES DAILY 150 strip 2   HYDROcodone-acetaminophen (NORCO/VICODIN) 5-325 MG tablet Take 1-2 tablets by mouth every 6 (six) hours as needed for moderate pain. 30 tablet 0   Insulin Disposable Pump (OMNIPOD DASH INTRO, GEN  4,) KIT Change every 3 days 1 kit 0   Insulin Disposable Pump (OMNIPOD DASH PODS, GEN 4,) MISC Use for insulin pump every 3 days 10 each 5   insulin glargine, 1 Unit Dial, (TOUJEO SOLOSTAR) 300 UNIT/ML Solostar Pen ADMINISTER 40 UNITS UNDER THE SKIN DAILY 3 mL 2   insulin lispro (HUMALOG) 100 UNIT/ML injection INJECT UP TO 90 UNITS UNDER THE SKIN VIA INSULIN PUMP DAILY 90 mL 1   Insulin Pen Needle 32G X 4 MM MISC Use 5 pens per day to inject insulin 200 each 3   Insulin Syringe-Needle U-100 (INSULIN SYRINGE .5CC/30GX5/16") 30G X 5/16" 0.5 ML MISC Use 3 times a day for insulin 100 each 1   losartan (COZAAR) 50 MG tablet TAKE 1 TABLET(50 MG) BY MOUTH DAILY 90 tablet 1   methocarbamol (ROBAXIN) 500 MG tablet TAKE 1 TABLET(500 MG) BY MOUTH EVERY 6 HOURS AS NEEDED FOR MUSCLE SPASMS 40 tablet 1   NON FORMULARY Take 2 capsules by mouth daily. 4LIFE TRANSFER FACTORY CARDIO     OVER THE COUNTER MEDICATION Take 2 tablets by mouth daily. 4LIFE TRANSFER FACTORY RECALL     SYNJARDY XR 03-999 MG TB24 TAKE 2 TABLETS BY MOUTH DAILY 60 tablet 2   No current facility-administered medications on file prior to visit.    BP 112/66   Pulse 68   Temp 98.2 F (36.8 C)   Resp 18   Ht _0  (1.6 m)   Wt 205 lb 9.6 oz (93.3 kg)   SpO2 94%   BMI 36.42 kg/m        Objective:   Physical Exam  General- No acute distress. Pleasant patient. Neck- Full range of motion, no jvd Lungs- Clear, even and unlabored. Heart- regular rate and rhythm. Neurologic- CNII- XII grossly intact.   Skin- bilateal skin rash red and linear pattern both arms.  In antecubuital area. Left side has apearance of circular rash with some central clearing.    Assessment & Plan:   Patient Instructions  Htn- bp well controlled today. Pt is on losartan 100 mg daily.   High cholesterol- on atorvastatin 40 mg daily. Last lipid panel in 10-30-21. Pt ate today.  Diabetic. Last a1c in July was 7.4.  Continue  humalog in the Omnipod 3  insulin pump, Synjardy and Trulicity 1.5 mg. Upcoming appt with [name] per pt.  Rash- allrergic reaction but one area appears possible fungal. Rx mycolog to use twice daily. Update me in 2 weeks if rash cleared. If not will refer to dermatologist.  Future fasting cmp and lipid  panel.  Follow up date to be determined after lab review.   Mackie Pai, PA-C

## 2022-08-20 NOTE — Patient Instructions (Addendum)
Htn- bp well controlled today. Pt is on losartan 100 mg daily.   High cholesterol- on atorvastatin 40 mg daily. Last lipid panel in 10-30-21. Pt ate today.  Diabetic. Last a1c in July was 7.4.  Continue  humalog in the Omnipod 3 insulin pump, Synjardy and Trulicity 1.5 mg. Upcoming appt with [name] per pt.  Rash- allrergic reaction but one area appears possible fungal. Rx mycolog to use twice daily. Update me in 2 weeks if rash cleared. If not will refer to dermatologist.  Future fasting cmp and lipid panel.  Follow up date to be determined after lab review.

## 2022-08-24 ENCOUNTER — Other Ambulatory Visit (INDEPENDENT_AMBULATORY_CARE_PROVIDER_SITE_OTHER): Payer: Medicare Other

## 2022-08-24 DIAGNOSIS — E785 Hyperlipidemia, unspecified: Secondary | ICD-10-CM

## 2022-08-24 DIAGNOSIS — I10 Essential (primary) hypertension: Secondary | ICD-10-CM

## 2022-08-24 LAB — COMPREHENSIVE METABOLIC PANEL
ALT: 28 U/L (ref 0–53)
AST: 26 U/L (ref 0–37)
Albumin: 4.3 g/dL (ref 3.5–5.2)
Alkaline Phosphatase: 55 U/L (ref 39–117)
BUN: 19 mg/dL (ref 6–23)
CO2: 28 mEq/L (ref 19–32)
Calcium: 8.9 mg/dL (ref 8.4–10.5)
Chloride: 103 mEq/L (ref 96–112)
Creatinine, Ser: 0.76 mg/dL (ref 0.40–1.50)
GFR: 90.66 mL/min (ref >60.00)
Glucose, Bld: 133 mg/dL — ABNORMAL HIGH (ref 70–99)
Potassium: 4 mEq/L (ref 3.5–5.1)
Sodium: 139 mEq/L (ref 135–145)
Total Bilirubin: 0.6 mg/dL (ref 0.2–1.2)
Total Protein: 6.6 g/dL (ref 6.0–8.3)

## 2022-08-24 LAB — LIPID PANEL
Cholesterol: 96 mg/dL (ref 0–200)
HDL: 38.7 mg/dL — ABNORMAL LOW (ref >39.00)
LDL Cholesterol: 17 mg/dL (ref 0–99)
NonHDL: 57.15
Total CHOL/HDL Ratio: 2
Triglycerides: 199 mg/dL — ABNORMAL HIGH (ref 0.0–149.0)
VLDL: 39.8 mg/dL (ref 0.0–40.0)

## 2022-09-06 ENCOUNTER — Encounter: Payer: Self-pay | Admitting: Endocrinology

## 2022-09-06 ENCOUNTER — Ambulatory Visit: Payer: Medicare Other | Admitting: Endocrinology

## 2022-09-06 VITALS — BP 152/82 | HR 72 | Ht 63.0 in | Wt 204.6 lb

## 2022-09-06 DIAGNOSIS — Z794 Long term (current) use of insulin: Secondary | ICD-10-CM | POA: Diagnosis not present

## 2022-09-06 DIAGNOSIS — E1165 Type 2 diabetes mellitus with hyperglycemia: Secondary | ICD-10-CM | POA: Diagnosis not present

## 2022-09-06 DIAGNOSIS — I10 Essential (primary) hypertension: Secondary | ICD-10-CM | POA: Diagnosis not present

## 2022-09-06 LAB — POCT GLYCOSYLATED HEMOGLOBIN (HGB A1C): Hemoglobin A1C: 7.1 % — AB (ref 4.0–5.6)

## 2022-09-06 NOTE — Patient Instructions (Signed)
Enter sugar at time of bolus  Bolus 10 min before breakfast at least  Introduzca el azcar en el momento del bolo  Bolo 10 min antes del desayuno al [facility]

## 2022-09-06 NOTE — Progress Notes (Unsigned)
Patient ID: FAIZ WEBER, male   DOB: 15-Sep-1951, 71 y.o.   MRN: [id number]            Reason for Appointment:  Followup for Type 2 Diabetes   History of Present Illness:          Diagnosis: Type 2 diabetes mellitus, date of diagnosis:  1990      Past history: He thinks he has been on insulin for the last 10-12 years. Previously had been on metformin which has been continued. He was probably tried on different insulin regimens initially but has been taking 70/30 mostly because of cost Previous records are not available and appears that his sugars have been poorly controlled for several years He was  referred here because of an A1c in 8/15 of 9.7%. He was on a regimen of Novolin 70/30 twice a day but because of inadequate control and higher readings later in the day he was switched to basal bolus insulin regimen in 12/15. Previous insulin regimen: Humalog  ac meals 35-40-30 Toujeo 35-40 units daily  Recent history:   INSULIN regimen is: OMNIPOD INSULIN PUMP  Basal rate settings: Midnight = 0.9, 2.30am: 2.0. 8 a.m. = 1.65 Boluses 12 units breakfast -14 units lunch and 18 units at dinner at meals Total daily insulin up to 100 units  Carbohydrate coverage 1:10 and correction 1:50   Non-insulin hypoglycemic drugs:   Trulicity 3 mg weekly, Synjardy XR 03/999, 2 tablets daily  A1c is 7.4 compared to 7%  Current management, blood sugar patterns and problems identified: He still does not enter his blood sugars or carbohydrates when he is bolusing Difficult to know how he is planning his boluses as he is stating that sometimes he will bolus only half his bolus amount of about 10 units before starting to eat and another 8 to 10 units if blood sugars are going up after the meal However occasionally has 3 consecutive boluses within a couple of hours and he says this is because sugars are not coming down With this his blood sugars may be low normal at times before lunch or bedtime but not  hypoglycemic Currently requiring much more and bolus insulin compared to basal insulin and only 36% with basal insulin He is not able to do much walking because of knee pain Even with adjusting his basal rates he still has significant dawn phenomenon and may take some boluses around 5 AM if blood sugars are higher, he thinks his breakfast is after 8 AM usually Although he thinks he has a fair amount of satiety from Trulicity he thinks he can benefit from a higher dose  His mealtimes are generally variable, evening meal 5-7 PM       Side effects from medications have been: ?  Nausea/dizziness on Victoza  Interpretation of the download from his freestyle libre 14-day CGM shows the following  His blood sugars are showing mild hyperglycemia at most times with lowest readings between 11 PM and about 2 AM and HIGHEST readings in the mornings between 6-8 AM OVERNIGHT blood sugars are somewhat variable but usually start rising after about 2-3 AM until breakfast time, may be mildly increased at midnight to start with.  Overnight average blood sugars are about 135 before 4 AM POSTPRANDIAL readings are mostly higher after breakfast although he appears to have a dawn phenomenon before eating breakfast also Blood sugars after lunch are mostly well controlled with an average rise of less than 50 After dinner blood sugars rise  is also within 30-40 points on average Pre-meal readings may tend to be higher before dinner No hypoglycemia  CGM use % of time 98  2-week average/GV 158/27  Time in range    72   %  % Time Above 180 26+2  % Time above 250   % Time Below 70 0     PRE-MEAL Fasting Lunch Dinner Bedtime Overall  Glucose range:       Averages: 150    158   POST-MEAL PC Breakfast PC Lunch PC Dinner  Glucose range:     Averages: 184 176 176    Prior   CGM use % of time   2-week average/GV 150  Time in range     83   %  % Time Above 180 15+2  % Time above 250   % Time Below 70       PRE-MEAL Fasting Lunch Dinner Bedtime Overall  Glucose range:       Averages: 141 148 140 146    POST-MEAL PC Breakfast PC Lunch PC Dinner  Glucose range:     Averages: 182 154 158       Dietician visit, most recent: 5/16 Last CDE visit: 12/2016            Weight history: Previous range 200-220  Wt Readings from Last 3 Encounters:  09/06/22 204 lb 9.6 oz (92.8 kg)  08/20/22 205 lb 9.6 oz (93.3 kg)  05/24/22 207 lb 12.8 oz (94.3 kg)    Glycemic control:   Lab Results  Component Value Date   HGBA1C 7.1 (A) 09/06/2022   HGBA1C 7.4 (A) 05/24/2022   HGBA1C 7.0 (A) 12/10/2021   Lab Results  Component Value Date   MICROALBUR <0.7 05/24/2022   LDLCALC 17 08/24/2022   CREATININE 0.76 08/24/2022    Lab Results  Component Value Date   FRUCTOSAMINE 302 (H) 07/20/2017       Allergies as of 09/06/2022   No Known Allergies      Medication List        Accurate as of September 06, 2022  4:06 PM. If you have any questions, ask your nurse or doctor.          aspirin 81 MG chewable tablet Chew 1 tablet (81 mg total) by mouth 2 (two) times daily.   atorvastatin 40 MG tablet Commonly known as: LIPITOR 1 tab po am and 1/2 tab po q day   diclofenac 75 MG EC tablet Commonly known as: VOLTAREN TAKE 1 TABLET(75 MG) BY MOUTH TWICE DAILY   famotidine 20 MG tablet Commonly known as: PEPCID Take 1 tablet (20 mg total) by mouth daily.   FreeStyle Libre 2 Sensor Misc 2 Devices by Does not apply route every 14 (fourteen) days.   FREESTYLE LITE test strip Generic drug: glucose blood USE AS INSTRUCTED FOUR TIMES DAILY   gabapentin 100 MG capsule Commonly known as: NEURONTIN TAKE 1 CAPSULE(100 MG) BY MOUTH THREE TIMES DAILY   HYDROcodone-acetaminophen 5-325 MG tablet Commonly known as: NORCO/VICODIN Take 1-2 tablets by mouth every 6 (six) hours as needed for moderate pain.   insulin lispro 100 UNIT/ML injection Commonly known as: HumaLOG INJECT UP TO 90 UNITS UNDER  THE SKIN VIA INSULIN PUMP DAILY   Insulin Pen Needle 32G X 4 MM Misc Use 5 pens per day to inject insulin   B-D UF III MINI PEN NEEDLES 31G X 5 MM Misc Generic drug: Insulin Pen Needle USE FIVE PER DAY AS  DIRECTED   INSULIN SYRINGE .5CC/30GX5/16" 30G X 5/16" 0.5 ML Misc Use 3 times a day for insulin   losartan 50 MG tablet Commonly known as: COZAAR TAKE 1 TABLET(50 MG) BY MOUTH DAILY   methocarbamol 500 MG tablet Commonly known as: ROBAXIN TAKE 1 TABLET(500 MG) BY MOUTH EVERY 6 HOURS AS NEEDED FOR MUSCLE SPASMS   NON FORMULARY Take 2 capsules by mouth daily. 4LIFE TRANSFER FACTORY CARDIO   nystatin-triamcinolone ointment Commonly known as: MYCOLOG Apply 1 Application topically 2 (two) times daily.   Omnipod DASH Pods (Gen 4) Misc Use for insulin pump every 3 days   Omnipod DASH Intro (Gen 4) Kit Change every 3 days   OVER THE COUNTER MEDICATION Take 2 tablets by mouth daily. 4LIFE TRANSFER FACTORY RECALL   Synjardy XR 03-999 MG Tb24 Generic drug: Empagliflozin-metFORMIN HCl ER TAKE 2 TABLETS BY MOUTH DAILY   Toujeo SoloStar 300 UNIT/ML Solostar Pen Generic drug: insulin glargine (1 Unit Dial) ADMINISTER 40 UNITS UNDER THE SKIN DAILY   Trulicity 3 GU/4.4IH Sopn Generic drug: Dulaglutide Inject 3 mg into the skin once a week.        Allergies: No Known Allergies  Past Medical History:  Diagnosis Date   Blood transfusion without reported diagnosis    Diabetes mellitus without complication ([place])    type II   History of ETOH abuse    Hypertension     Past Surgical History:  Procedure Laterality Date   BACK SURGERY N/A 2010   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   Procedure: LEFT TOTAL KNEE ARTHROPLASTY;  Surgeon: Mcarthur Rossetti, MD;  Location: [facility];  Service: Orthopedics;  Laterality: Left;    Family History  Problem Relation Age of Onset   Diabetes Unknown    Hypertension Unknown    Arthritis Father    Diabetes Father     Social  History:  reports that he has quit smoking. He quit smokeless tobacco use about 28 years ago. He reports that he does not drink alcohol and does not use drugs.    Review of Systems         Lipids:  He has been on Lipitor 40 mg from his PCP for hypercholesterolemia since about 2011, lipid levels as below        Lab Results  Component Value Date   CHOL 96 08/24/2022   HDL 38.70 (L) 08/24/2022   LDLCALC 17 08/24/2022   LDLDIRECT 44.0 01/24/2020   TRIG 199.0 (H) 08/24/2022   CHOLHDL 2 08/24/2022    Hypertension: Controlled Has been treated with losartan 50 mg, prescribed by PCP Also on Jardiance   BP Readings from Last 3 Encounters:  09/06/22 (!) 152/82  08/20/22 112/66  05/24/22 128/70    Microalbumin needs to be rechecked today   Physical Examination:  BP (!) 152/82   Pulse 72   Ht _0  (1.6 m)   Wt 204 lb 9.6 oz (92.8 kg)   SpO2 92%   BMI 36.24 kg/m    ASSESSMENT:  Diabetes type 2, with obesity and BMI of around 36  See history of present illness for  description of current diabetes management, blood sugar patterns and problems identified..  His diabetes is being managed with Humalog in the Omnipod 3 insulin pump, Synjardy and Trulicity 1.5 mg  His K7Q is 7.4  Explained to him that he needs to stop stacking boluses as this is causing low normal sugars subsequently and then rebounding Again explained to him that his  boluses for meals should be taken just before mealtime even if the blood sugar is normal because he is covering his food intake Also he does tend to have a rise in blood sugar early morning despite adjusting his basal settings on the last visit  Unable to count carbohydrates  Currently using a freestyle libre and needs to upgrade to the [place] 2 blood sugar at least  Also his not capable of changing his pump settings himself Not clear why he is not upgrading to the newer OmniPod systems  HYPERTENSION: Mild and well controlled    PLAN:    Will need to increase his early morning basal rate as he has a dawn phenomenon   Because a basal rate for his dawn phenomenon and will give him 1.7 units an hour from 4 AM to 8 AM  Programming was done for basal to be 1.6 the rest of the day and continue 0.9 from 12 AM-4 AM He will also need to periodically check fingerstick to compare with his freestyle libre  Discussed needing to take no more than 10 units as a correction dose for high sugars and will have improved and his blood sugar in order to do the correction   He will benefit from increase of the dose of Trulicity up to 3 mg and new prescription sent This will hopefully improve his need for mealtime insulin as well as promote some weight loss with his BMI nearly 37  No change in [place] but will need to follow-up renal function studies today   Today's visit including counseling and evaluation and management duration = 30 minutes  There are no Patient Instructions on file for this visit.   Elayne Snare 09/06/2022, 4:06 PM   Note: This office note was prepared with Dragon voice recognition system technology. Any transcriptional errors that result from this process are unintentional. May

## 2022-09-07 ENCOUNTER — Encounter: Payer: Self-pay | Admitting: Endocrinology

## 2022-09-07 MED ORDER — TRULICITY 4.5 MG/0.5ML ~~LOC~~ SOAJ: 4.5000 mg | SUBCUTANEOUS | 5 refills | Status: DC

## 2022-10-26 ENCOUNTER — Other Ambulatory Visit: Payer: Self-pay | Admitting: Endocrinology

## 2022-10-26 DIAGNOSIS — E1165 Type 2 diabetes mellitus with hyperglycemia: Secondary | ICD-10-CM

## 2022-10-29 ENCOUNTER — Telehealth: Payer: Self-pay | Admitting: Endocrinology

## 2022-10-29 DIAGNOSIS — E1165 Type 2 diabetes mellitus with hyperglycemia: Secondary | ICD-10-CM

## 2022-10-29 MED ORDER — FREESTYLE LITE TEST VI STRP: ORAL_STRIP | 2 refills | Status: DC

## 2022-10-29 NOTE — Telephone Encounter (Signed)
Rx has been sent to pharmacy

## 2022-10-29 NOTE — Telephone Encounter (Signed)
MEDICATION:  FREESTYLE LITE glucose blood (FREESTYLE LITE) test strip  PHARMACY:    [id number] - [place], [place] - [address] AT [facility] OF [address] (Ph: [phone])    HAS THE PATIENT CONTACTED THEIR PHARMACY?  Yes  IS THIS A 90 DAY SUPPLY : No  IS PATIENT OUT OF MEDICATION:  Yes  IF NOT; HOW MUCH IS LEFT:   LAST APPOINTMENT DATE: @10 /30/2023  NEXT APPOINTMENT DATE:@3 /09/2023  DO WE HAVE YOUR PERMISSION TO LEAVE A DETAILED MESSAGE?: Yes  OTHER COMMENTS:  Per patient's daughter he needs 1 box of test strips until they receive the sensors.   **Let patient know to contact pharmacy at the end of the day to make sure medication is ready. **  ** Please notify patient to allow 48-72 hours to process**  **Encourage patient to contact the pharmacy for refills or they can request refills through [facility]**

## 2022-11-05 ENCOUNTER — Other Ambulatory Visit: Payer: Self-pay

## 2022-11-05 DIAGNOSIS — E1165 Type 2 diabetes mellitus with hyperglycemia: Secondary | ICD-10-CM

## 2022-11-05 MED ORDER — INSULIN LISPRO 100 UNIT/ML IJ SOLN: INTRAMUSCULAR | 1 refills | Status: DC

## 2022-11-09 IMAGING — DX DG HIP (WITH OR WITHOUT PELVIS) 2-3V*L*
3 series · 3 of 3 positions shown · non-contrast
Comparison: None.

CLINICAL DATA: Atraumatic lateral left hip pain and left knee pain
x1.

EXAM:
DG HIP (WITH OR WITHOUT PELVIS) 2-3V LEFT

[hip ap]
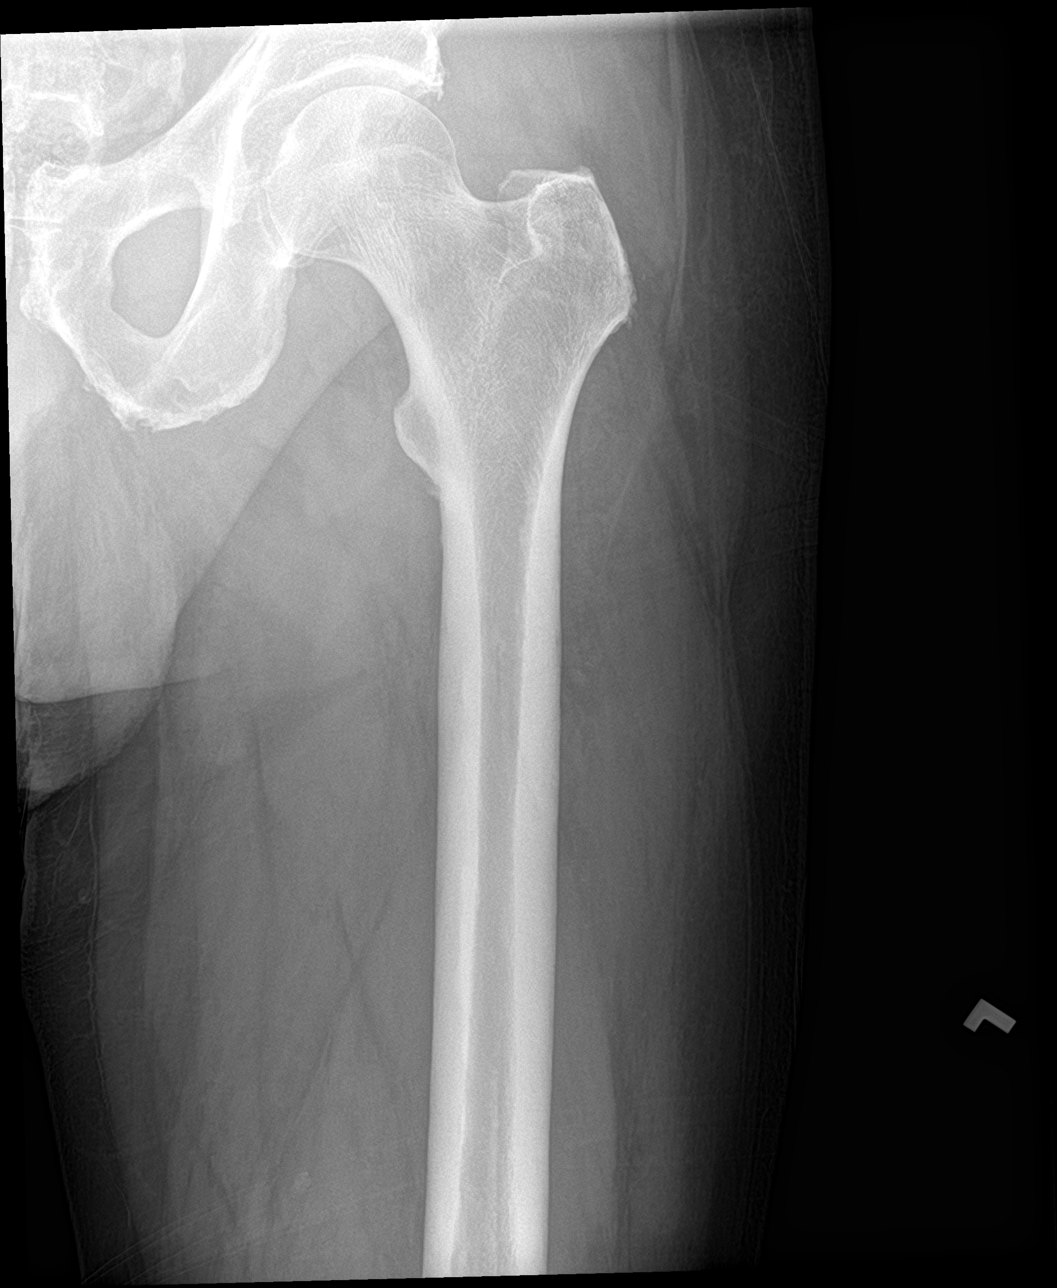

[hip lat]
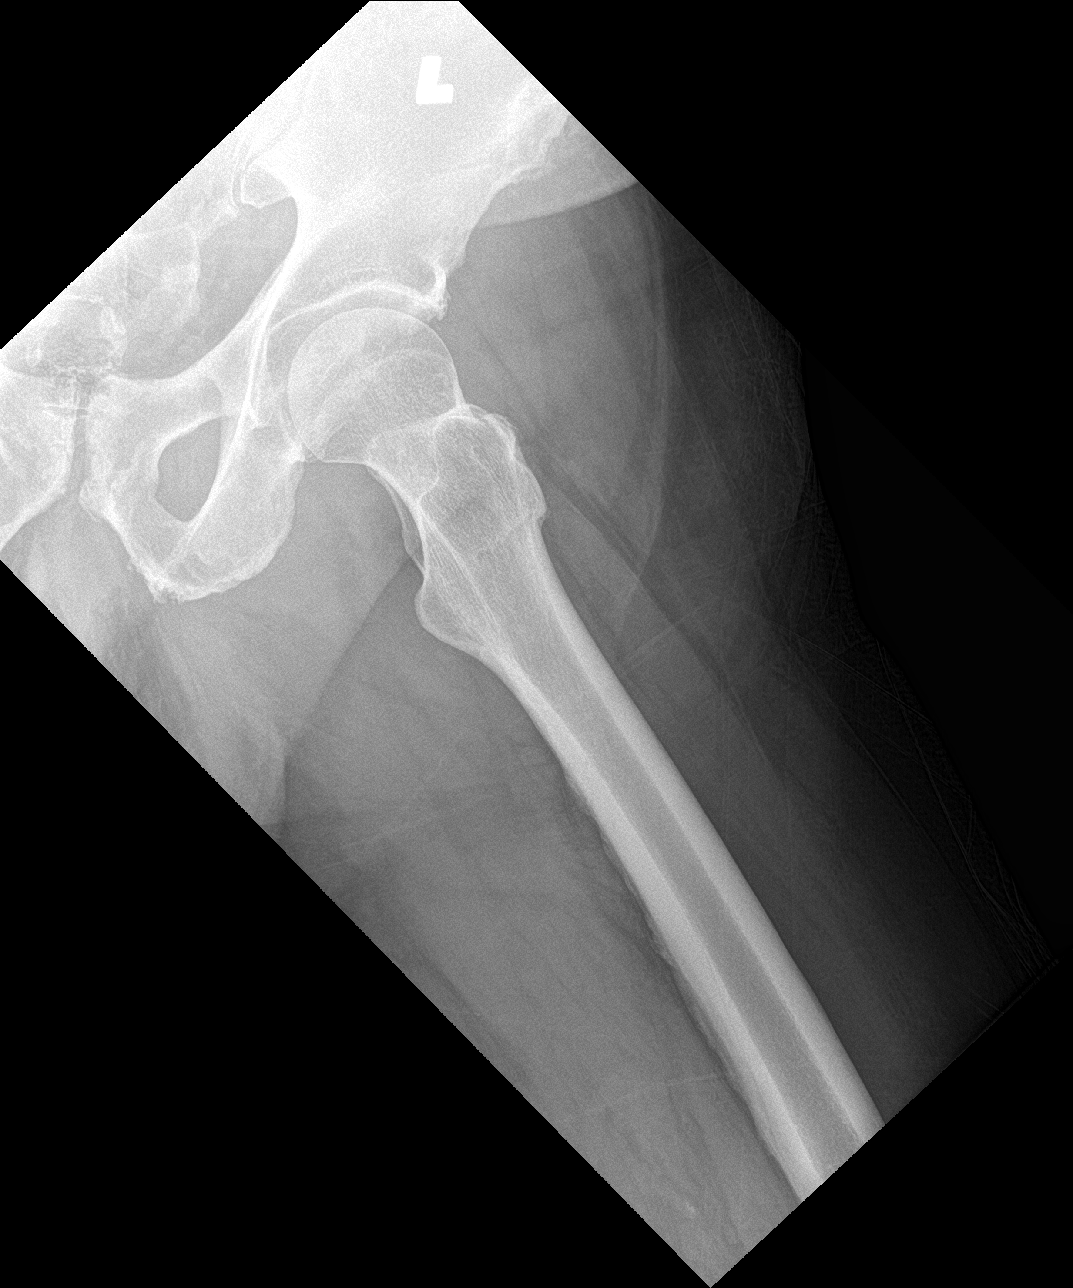

[pelvis ap]
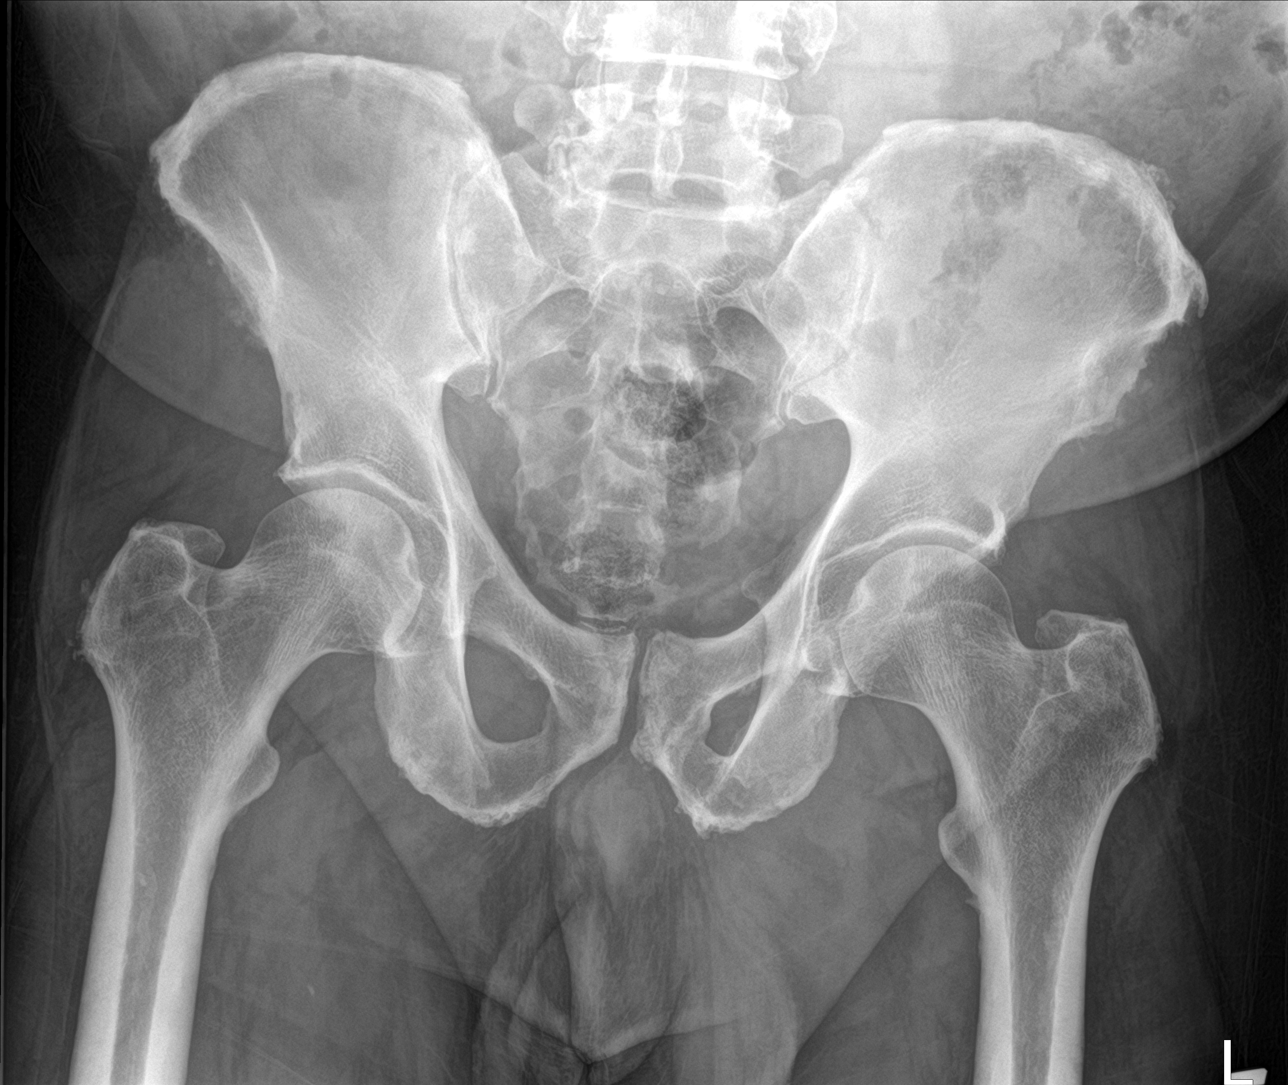

[3 of 3 positions shown; findings below may reference images not displayed]

FINDINGS: There is no evidence of hip fracture or dislocation. Mild to
moderate severity degenerative changes are seen involving both hips
in the form of joint space narrowing and acetabular sclerosis. Soft
tissue structures are unremarkable.
IMPRESSION: No acute osseous abnormality.

## 2022-11-21 ENCOUNTER — Other Ambulatory Visit: Payer: Self-pay | Admitting: Medical

## 2022-11-26 ENCOUNTER — Ambulatory Visit (INDEPENDENT_AMBULATORY_CARE_PROVIDER_SITE_OTHER): Payer: Medicare Other | Admitting: Medical

## 2022-11-26 VITALS — BP 136/60 | HR 72 | Temp 98.0°F | Resp 18 | Ht 63.0 in | Wt 206.4 lb

## 2022-11-26 DIAGNOSIS — R42 Dizziness and giddiness: Secondary | ICD-10-CM

## 2022-11-26 DIAGNOSIS — R5383 Other fatigue: Secondary | ICD-10-CM

## 2022-11-26 NOTE — Progress Notes (Signed)
Subjective:    Patient ID: Samuel [name], male    DOB: 1951/02/21, 72 y.o.   MRN: [id number]  HPI  Pt in for follow up.states he feels fatigued and occasional rare dizziness intermittent for past 2 months. Pt wonders if his bp elevated. He states will random sensation of desperation and anxiousness(but denies hx). Will feel slight sob for only one minute and then will subside. No calf pain.  Pt states his random dizziness is only seconds. He states none recently but last time had was more than a week ago. Not describing vertigo. No associated with ha, blurred vision, tachycardia, chest pain or palpation.   When these events occurred his bp and sugar normal. Admits never checks pulse.    Review of Systems  Constitutional:  Negative for chills, fatigue and fever.  HENT:  Negative for congestion and facial swelling.   Eyes:  Negative for photophobia, pain and visual disturbance.  Respiratory:  Negative for cough, chest tightness, shortness of breath and wheezing.   Cardiovascular:  Negative for chest pain and palpitations.  Gastrointestinal:  Negative for abdominal pain.  Genitourinary:  Negative for dysuria.  Musculoskeletal:  Negative for back pain and myalgias.  Skin:        Asymetric swelling and bump to left side of nose. For year. He states squeezed and no dc. No pain or warmth.  Neurological:  Negative for dizziness, seizures, speech difficulty, weakness, numbness and headaches.  Hematological:  Negative for adenopathy. Does not bruise/bleed easily.  Psychiatric/Behavioral:  Negative for sleep disturbance. The patient is not nervous/anxious.        Possible anxious    Past Medical History:  Diagnosis Date   Blood transfusion without reported diagnosis    Diabetes mellitus without complication ([place])    type II   History of ETOH abuse    Hypertension      Social History   Socioeconomic History   Marital status: Married    Spouse name: Not on file   Number of  children: Not on file   Years of education: Not on file   Highest education level: Not on file  Occupational History   Not on file  Tobacco Use   Smoking status: Former   Smokeless tobacco: Former    Quit date: 11/08/1993  Vaping Use   Vaping Use: Never used  Substance and Sexual Activity   Alcohol use: No   Drug use: No   Sexual activity: Not on file  Other Topics Concern   Not on file  Social History Narrative   Not on file   Social Determinants of Health   Financial Resource Strain: Not on file  Food Insecurity: Not on file  Transportation Needs: Not on file  Physical Activity: Not on file  Stress: Not on file  Social Connections: Not on file  Intimate Partner Violence: Not on file    Past Surgical History:  Procedure Laterality Date   BACK SURGERY N/A 2010   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 60 tablet 0   atorvastatin (LIPITOR) 40 MG tablet 1 tab po am and 1/2 tab po q day 135 tablet 1   B-D UF III MINI PEN NEEDLES 31G X 5 MM MISC USE FIVE PER DAY AS DIRECTED 200 each 0   Continuous Blood Gluc Sensor (FREESTYLE LIBRE 2 SENSOR) MISC 2 Devices by Does not apply route every 14 (fourteen) days. 2 each 3   diclofenac (VOLTAREN) 75 MG EC tablet TAKE 1 TABLET(75 MG) BY MOUTH TWICE DAILY 60 tablet 1   Dulaglutide (TRULICITY) 4.5 GU/4.4IH SOPN Inject 4.5 mg as directed once a week. 2 mL 5   famotidine (PEPCID) 20 MG tablet Take 1 tablet (20 mg total) by mouth daily. (Patient not taking: Reported on  09/06/2022) 30 tablet 0   glucose blood (FREESTYLE LITE) test strip USE AS INSTRUCTED FOUR TIMES DAILY 150 strip 2   HYDROcodone-acetaminophen (NORCO/VICODIN) 5-325 MG tablet Take 1-2 tablets by mouth every 6 (six) hours as needed for moderate pain. (Patient not taking: Reported on 09/06/2022) 30 tablet 0   Insulin Disposable Pump (OMNIPOD DASH INTRO, GEN 4,) KIT Change every 3 days 1 kit 0   Insulin Disposable Pump (OMNIPOD DASH PODS, GEN 4,) MISC USE FOR INSULIN PUMP EVEY 3 DAYS 10 each 5   insulin lispro (HUMALOG) 100 UNIT/ML injection INJECT UP TO 90 UNITS UNDER THE SKIN VIA INSULIN PUMP DAILY 90 mL 1   Insulin Pen Needle 32G X 4 MM MISC Use 5 pens per day to inject insulin 200 each 3   Insulin Syringe-Needle U-100 (INSULIN SYRINGE .5CC/30GX5/16") 30G X 5/16" 0.5 ML MISC Use 3 times a day for insulin 100 each 1   losartan (COZAAR) 50 MG tablet TAKE 1 TABLET(50 MG) BY MOUTH DAILY 90 tablet 1   methocarbamol (ROBAXIN) 500 MG tablet TAKE 1 TABLET(500 MG) BY MOUTH EVERY 6 HOURS AS NEEDED FOR MUSCLE SPASMS (Patient not taking: Reported on 09/06/2022) 40 tablet 1   NON FORMULARY Take 2 capsules by mouth daily. 4LIFE TRANSFER FACTORY CARDIO (Patient not taking: Reported on 09/06/2022)     nystatin-triamcinolone ointment (MYCOLOG) Apply 1 Application topically 2 (two) times daily. 30 g 1   OVER THE COUNTER MEDICATION Take 2 tablets by mouth daily. 4LIFE TRANSFER FACTORY RECALL (Patient not taking: Reported on 09/06/2022)     SYNJARDY XR 03-999 MG TB24 TAKE 2 TABLETS BY MOUTH DAILY 60 tablet 2   No current facility-administered medications on file prior to visit.    BP 136/60   Pulse 72   Temp 98 F (36.7 C)   Resp 18   Ht 5\' 3"  (1.6 m)   Wt 206 lb 6.4 oz (93.6 kg)   SpO2 95%   BMI 36.56 kg/m        Objective:   Physical Exam  General Mental Status- Alert. General Appearance- Not in acute distress.   Skin General: Color- Normal Color. Moisture- Normal Moisture.  Neck Carotid  Arteries- Normal color. Moisture- Normal Moisture. No carotid bruits. No JVD.  Chest and Lung Exam Auscultation: Breath Sounds:-Normal.  Cardiovascular Auscultation:Rythm- Regular. Murmurs & Other Heart Sounds:Auscultation of the heart reveals- No Murmurs.  Abdomen Inspection:-Inspeection Normal. Palpation/Percussion:Note:No mass. Palpation and Percussion of the abdomen reveal- Non Tender, Non Distended + BS, no rebound or guarding.   Neurologic Cranial Nerve exam:- CN III-XII intact(No nystagmus), symmetric smile. Drift Test:- No drift. Finger to Nose:- Normal/Intact Strength:- 5/5 equal and symmetric strength both upper  and lower extremities.  Lying supine and turning head no vertigo.  Random      Assessment & Plan:   Patient Instructions  Some fatigue over the past 2 months.  Will get CBC, CMP, TSH, T4 and B12 level today.  Random episodes of transient very brief dizziness with no other associated cardiac or neurologic signs and symptoms.  However does note sensation of desperation briefly for seconds then that subsides.  No current dizziness symptoms presently.  Advised that when you do get symptoms I want you to check your blood pressure, pulse and sugar levels.  Notify me blood pressures greater than 150/90 or less than 110/60.  Pulse readings notify me if you get readings over 100 or less than 60.  Sugar levels notify me if any sugar levels over 300 or less than 70.  Will follow your labs and follow vital signs and sugar levels.  If your symptoms randomly recur and workup negative might consider low-dose BuSpar to treat possible anxiety.  Note I am not diagnosing you with anxiety  presently but your description of desperation makes me think this is possible.  Follow up in 2 weeks or sooner if needed.   Esperanza Richters, PA-C

## 2022-11-26 NOTE — Patient Instructions (Addendum)
Some fatigue over the past 2 months.  Will get CBC, CMP, TSH, T4 and B12 level today.  Random episodes of transient very brief dizziness with no other associated cardiac or neurologic signs and symptoms.  However does note sensation of desperation briefly for seconds then that subsides.  No current dizziness symptoms presently.  Advised that when you do get symptoms I want you to check your blood pressure, pulse and sugar levels.  Notify me blood pressures greater than 150/90 or less than 110/60.  Pulse readings notify me if you get readings over 100 or less than 60.  Sugar levels notify me if any sugar levels over 300 or less than 70.  Will follow your labs and follow vital signs and sugar levels.  If your symptoms randomly recur and workup negative might consider low-dose BuSpar to treat possible anxiety.  Note I am not diagnosing you with anxiety  presently but your description of desperation makes me think this is possible.  Follow up in 2 weeks or sooner if needed.  EKG sinus rhythm. Possible anterior fascicular block.(Discuss further pt on follow up). I don't see old EKG to compare.

## 2022-11-27 LAB — CBC WITH DIFFERENTIAL/PLATELET
Absolute Monocytes: 482 cells/uL (ref 200–950)
Basophils Absolute: 62 cells/uL (ref 0–200)
Basophils Relative: 1.1 %
Eosinophils Absolute: 168 cells/uL (ref 15–500)
Eosinophils Relative: 3 %
HCT: 44.9 % (ref 38.5–50.0)
Hemoglobin: 15 g/dL (ref 13.2–17.1)
Lymphs Abs: 1999 cells/uL (ref 850–3900)
MCH: 30.3 pg (ref 27.0–33.0)
MCHC: 33.4 g/dL (ref 32.0–36.0)
MCV: 90.7 fL (ref 80.0–100.0)
MPV: 9.3 fL (ref 7.5–12.5)
Monocytes Relative: 8.6 %
Neutro Abs: 2890 cells/uL (ref 1500–7800)
Neutrophils Relative %: 51.6 %
Platelets: 187 10*3/uL (ref 140–400)
RBC: 4.95 10*6/uL (ref 4.20–5.80)
RDW: 13.4 % (ref 11.0–15.0)
Total Lymphocyte: 35.7 %
WBC: 5.6 10*3/uL (ref 3.8–10.8)

## 2022-11-27 LAB — COMPREHENSIVE METABOLIC PANEL
AG Ratio: 1.9 (calc) (ref 1.0–2.5)
ALT: 20 U/L (ref 9–46)
AST: 17 U/L (ref 10–35)
Albumin: 4.3 g/dL (ref 3.6–5.1)
Alkaline phosphatase (APISO): 60 U/L (ref 35–144)
BUN: 22 mg/dL (ref 7–25)
CO2: 27 mmol/L (ref 20–32)
Calcium: 9.1 mg/dL (ref 8.6–10.3)
Chloride: 105 mmol/L (ref 98–110)
Creat: 0.87 mg/dL (ref 0.70–1.28)
Globulin: 2.3 g/dL (calc) (ref 1.9–3.7)
Glucose, Bld: 150 mg/dL — ABNORMAL HIGH (ref 65–99)
Potassium: 4.3 mmol/L (ref 3.5–5.3)
Sodium: 141 mmol/L (ref 135–146)
Total Bilirubin: 0.5 mg/dL (ref 0.2–1.2)
Total Protein: 6.6 g/dL (ref 6.1–8.1)

## 2022-11-27 LAB — T4, FREE: Free T4: 0.9 ng/dL (ref 0.8–1.8)

## 2022-11-27 LAB — TSH: TSH: 1.94 mIU/L (ref 0.40–4.50)

## 2022-12-01 ENCOUNTER — Telehealth: Payer: Self-pay

## 2022-12-01 NOTE — Telephone Encounter (Signed)
[facility] called requesting chart notes. Chart notes faxed to [phone].

## 2022-12-08 DIAGNOSIS — Z794 Long term (current) use of insulin: Secondary | ICD-10-CM | POA: Diagnosis not present

## 2022-12-08 DIAGNOSIS — E1165 Type 2 diabetes mellitus with hyperglycemia: Secondary | ICD-10-CM | POA: Diagnosis not present

## 2022-12-08 DIAGNOSIS — Z1211 Encounter for screening for malignant neoplasm of colon: Secondary | ICD-10-CM | POA: Diagnosis not present

## 2022-12-08 DIAGNOSIS — Z86010 Personal history of colonic polyps: Secondary | ICD-10-CM | POA: Diagnosis not present

## 2022-12-08 DIAGNOSIS — K5730 Diverticulosis of large intestine without perforation or abscess without bleeding: Secondary | ICD-10-CM | POA: Diagnosis not present

## 2022-12-08 DIAGNOSIS — E11319 Type 2 diabetes mellitus with unspecified diabetic retinopathy without macular edema: Secondary | ICD-10-CM | POA: Diagnosis not present

## 2022-12-08 DIAGNOSIS — K648 Other hemorrhoids: Secondary | ICD-10-CM | POA: Diagnosis not present

## 2022-12-08 DIAGNOSIS — Z8719 Personal history of other diseases of the digestive system: Secondary | ICD-10-CM | POA: Diagnosis not present

## 2022-12-08 LAB — HM COLONOSCOPY

## 2022-12-13 DIAGNOSIS — D485 Neoplasm of uncertain behavior of skin: Secondary | ICD-10-CM | POA: Diagnosis not present

## 2022-12-13 DIAGNOSIS — D234 Other benign neoplasm of skin of scalp and neck: Secondary | ICD-10-CM | POA: Diagnosis not present

## 2022-12-13 DIAGNOSIS — L2089 Other atopic dermatitis: Secondary | ICD-10-CM | POA: Diagnosis not present

## 2022-12-26 ENCOUNTER — Other Ambulatory Visit: Payer: Self-pay | Admitting: Endocrinology

## 2022-12-26 DIAGNOSIS — E1165 Type 2 diabetes mellitus with hyperglycemia: Secondary | ICD-10-CM

## 2022-12-29 ENCOUNTER — Ambulatory Visit (INDEPENDENT_AMBULATORY_CARE_PROVIDER_SITE_OTHER): Payer: Medicare Other | Admitting: Medical

## 2022-12-29 ENCOUNTER — Other Ambulatory Visit: Payer: Self-pay | Admitting: Medical

## 2022-12-29 VITALS — BP 140/80 | HR 78 | Temp 98.7°F | Resp 20 | Ht 63.0 in | Wt 206.0 lb

## 2022-12-29 DIAGNOSIS — J3489 Other specified disorders of nose and nasal sinuses: Secondary | ICD-10-CM | POA: Diagnosis not present

## 2022-12-29 DIAGNOSIS — R0981 Nasal congestion: Secondary | ICD-10-CM

## 2022-12-29 DIAGNOSIS — H9202 Otalgia, left ear: Secondary | ICD-10-CM | POA: Diagnosis not present

## 2022-12-29 MED ORDER — BENZONATATE 100 MG PO CAPS: 100.0000 mg | ORAL_CAPSULE | Freq: Three times a day (TID) | ORAL | 0 refills | Status: DC | PRN

## 2022-12-29 MED ORDER — FLUTICASONE PROPIONATE 50 MCG/ACT NA SUSP: 2.0000 | Freq: Every day | NASAL | 1 refills | Status: DC

## 2022-12-29 MED ORDER — AZITHROMYCIN 250 MG PO TABS: ORAL_TABLET | ORAL | 0 refills | Status: AC

## 2022-12-29 NOTE — Patient Instructions (Addendum)
Day 4 of uri type signs/symptoms but now sinus pressure, left ear pain and upcoming trip.  Covid test negative yesterday. Recommend repeat test tomorrow morning. If + let me know as that would be day 5 and would rx antiviral if positive.  Rx flonase for nasal congestion and benzonatate for cough.  Rx azithromycin. If sinus pressure or ear pain worsens between now and friday go ahead and start in light of upcoming trip.   Follow up August(chronic problem follow up) or sooner if needed.month or sooner if needed.

## 2022-12-29 NOTE — Progress Notes (Signed)
Subjective:    Patient ID: Samuel [name], male    DOB: Feb 28, 1951, 72 y.o.   MRN: [id number]  HPI Pt has 4 days of nasal congestion, cough, mild ha and left ear pain. No fever, no chills or sweats. Pt check covid test yesterday and was negative. No bodyaches.    Pt wants to get better better as he will be traveling on Friday to [place] and then on Sunday will travel to [place].   Review of Systems  Constitutional:  Negative for chills, fatigue and fever.  HENT:  Positive for congestion, sinus pressure and sinus pain. Negative for sore throat.   Respiratory:  Positive for cough. Negative for chest tightness, shortness of breath and wheezing.   Cardiovascular:  Negative for chest pain and palpitations.  Gastrointestinal:  Negative for abdominal pain, blood in stool and diarrhea.  Genitourinary:  Negative for dysuria.  Musculoskeletal:  Negative for back pain and joint swelling.  Neurological:  Negative for dizziness, speech difficulty, weakness, numbness and headaches.  Hematological:  Negative for adenopathy.  Psychiatric/Behavioral:  Negative for behavioral problems, decreased concentration and dysphoric mood.     Past Medical History:  Diagnosis Date   Blood transfusion without reported diagnosis    Diabetes mellitus without complication ([place])    type II   History of ETOH abuse    Hypertension      Social History   Socioeconomic History   Marital status: Married    Spouse name: Not on file   Number of children: Not on file   Years of education: Not on file   Highest education level: Not on file  Occupational History   Not on file  Tobacco Use   Smoking status: Former   Smokeless tobacco: Former    Quit date: 11/08/1993  Vaping Use   Vaping Use: Never used  Substance and Sexual Activity   Alcohol use: No   Drug use: No   Sexual activity: Not on file  Other Topics Concern   Not on file  Social History Narrative   Not on file   Social Determinants of  Health   Financial Resource Strain: Not on file  Food Insecurity: Not on file  Transportation Needs: Not on file  Physical Activity: Not on file  Stress: Not on file  Social Connections: Not on file  Intimate Partner Violence: Not on file    Past Surgical History:  Procedure Laterality Date   BACK SURGERY N/A 2010   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 60 tablet 0   atorvastatin (LIPITOR) 40 MG tablet 1 tab po am and 1/2 tab po q day 135 tablet 1   B-D UF III MINI PEN NEEDLES 31G X 5 MM MISC USE FIVE PER DAY AS DIRECTED 200 each 0   Continuous Blood Gluc Sensor (FREESTYLE LIBRE 2 SENSOR) MISC 2 Devices by Does not apply route every 14 (fourteen) days. 2 each 3   diclofenac (VOLTAREN)  75 MG EC tablet TAKE 1 TABLET(75 MG) BY MOUTH TWICE DAILY 60 tablet 1   Dulaglutide (TRULICITY) 4.5 0000000 SOPN Inject 4.5 mg as directed once a week. 2 mL 5   famotidine (PEPCID) 20 MG tablet Take 1 tablet (20 mg total) by mouth daily. (Patient not taking: Reported on 09/06/2022) 30 tablet 0   glucose blood (FREESTYLE LITE) test strip USE AS INSTRUCTED FOUR TIMES DAILY 150 strip 2   HYDROcodone-acetaminophen (NORCO/VICODIN) 5-325 MG tablet Take 1-2 tablets by mouth every 6 (six) hours as needed for moderate pain. (Patient not taking: Reported on 09/06/2022) 30 tablet 0   Insulin Disposable Pump (OMNIPOD DASH INTRO, GEN 4,) KIT Change every 3 days 1 kit 0   Insulin Disposable Pump (OMNIPOD DASH PODS, GEN 4,) MISC USE FOR INSULIN  PUMP EVEY 3 DAYS 10 each 5   insulin lispro (HUMALOG) 100 UNIT/ML injection INJECT UP TO 90 UNITS UNDER THE SKIN VIA INSULIN PUMP DAILY 90 mL 1   Insulin Pen Needle 32G X 4 MM MISC Use 5 pens per day to inject insulin 200 each 3   Insulin Syringe-Needle U-100 (INSULIN SYRINGE .5CC/30GX5/16") 30G X 5/16" 0.5 ML MISC Use 3 times a day for insulin 100 each 1   losartan (COZAAR) 50 MG tablet TAKE 1 TABLET(50 MG) BY MOUTH DAILY 90 tablet 1   methocarbamol (ROBAXIN) 500 MG tablet TAKE 1 TABLET(500 MG) BY MOUTH EVERY 6 HOURS AS NEEDED FOR MUSCLE SPASMS (Patient not taking: Reported on 09/06/2022) 40 tablet 1   NON FORMULARY Take 2 capsules by mouth daily. 4LIFE TRANSFER FACTORY CARDIO (Patient not taking: Reported on 09/06/2022)     nystatin-triamcinolone ointment (MYCOLOG) Apply 1 Application topically 2 (two) times daily. 30 g 1   OVER THE COUNTER MEDICATION Take 2 tablets by mouth daily. 4LIFE TRANSFER FACTORY RECALL (Patient not taking: Reported on 09/06/2022)     SYNJARDY XR 03-999 MG TB24 TAKE 2 TABLETS BY MOUTH DAILY 60 tablet 2   No current facility-administered medications on file prior to visit.    BP (!) 140/80   Pulse 78   Temp 98.7 F (37.1 C)   Resp 20   Ht 5' 3"$  (1.6 m)   Wt 206 lb (93.4 kg)   SpO2 94%   BMI 36.49 kg/m          Objective:   Physical Exam  General Mental Status- Alert. General Appearance- Not in acute distress.   Skin General: Color- Normal Color. Moisture- Normal Moisture.  Neck Carotid Arteries- Normal color. Moisture- Normal Moisture. No carotid bruits. No JVD.  Chest and Lung Exam Auscultation: Breath Sounds:-Normal.  Cardiovascular Auscultation:Rythm- Regular. Murmurs & Other Heart Sounds:Auscultation of the heart reveals- No Murmurs.  Abdomen Inspection:-Inspeection Normal. Palpation/Percussion:Note:No mass. Palpation and Percussion of the abdomen reveal- Non Tender, Non Distended + BS, no rebound or guarding.  Neurologic Cranial  Nerve exam:- CN III-XII intact(No nystagmus), symmetric smile. Strength:- 5/5 equal and symmetric strength both upper and lower extremities.   Heent- faint maxillary sinus pressure. Mild dull left tm. Posterior pharynx normal.     Assessment & Plan:   Patient Instructions  Day 4 of uri type signs/symptoms but now sinus pressure, left ear pain and upcoming trip.  Covid test negative yesterday. Recommend repeat test tomorrow morning. If + let me know as that would be day 5 and would rx antiviral if positive.  Rx flonase for nasal congestion and benzonatate for cough.  Rx azithromycin. If sinus pressure or ear pain worsens  between now and friday go ahead and start in light of upcoming trip.   Follow up August(chronic problem follow up) or sooner if needed.month or sooner if needed.   Mackie Pai PA-C

## 2023-01-13 ENCOUNTER — Encounter: Payer: Self-pay | Admitting: Radiology

## 2023-01-17 ENCOUNTER — Ambulatory Visit: Payer: Medicare Other | Admitting: Endocrinology

## 2023-01-19 ENCOUNTER — Encounter: Payer: Self-pay | Admitting: Internal Medicine

## 2023-01-19 ENCOUNTER — Ambulatory Visit: Payer: Medicare Other | Admitting: Internal Medicine

## 2023-01-19 VITALS — BP 136/82 | HR 71 | Ht 63.0 in | Wt 203.0 lb

## 2023-01-19 DIAGNOSIS — Z794 Long term (current) use of insulin: Secondary | ICD-10-CM | POA: Diagnosis not present

## 2023-01-19 DIAGNOSIS — E1165 Type 2 diabetes mellitus with hyperglycemia: Secondary | ICD-10-CM | POA: Diagnosis not present

## 2023-01-19 LAB — POCT GLYCOSYLATED HEMOGLOBIN (HGB A1C): Hemoglobin A1C: 7.2 % — AB (ref 4.0–5.6)

## 2023-01-19 MED ORDER — SYNJARDY XR 5-1000 MG PO TB24: 2.0000 | ORAL_TABLET | Freq: Every day | ORAL | 3 refills | Status: DC

## 2023-01-19 MED ORDER — INSULIN LISPRO 100 UNIT/ML IJ SOLN: INTRAMUSCULAR | 3 refills | Status: DC

## 2023-01-19 MED ORDER — TRULICITY 4.5 MG/0.5ML ~~LOC~~ SOAJ: 4.5000 mg | SUBCUTANEOUS | 3 refills | Status: DC

## 2023-01-19 MED ORDER — "INSULIN SYRINGE 30G X 5/16"" 0.5 ML MISC": 1 refills | Status: DC

## 2023-01-19 NOTE — Patient Instructions (Addendum)
-   CMO TRATAR LOS BAJOS DE AZCAR EN LA SANGRE (azcar en la sangre MENOS DE 70 MG/DL) - Siga la REGLA DEL 15 para el tratamiento de la hipoglucemia (cuando su nivel de azcar en la sangre sea inferior a 70 mg/dL).  - PASO 1: Tome 15 gramos de carbohidratos cuando su nivel de azcar en la sangre sea bajo, lo que incluye: - 3-4 COMPRIMIDOS DE GLUCOSA O - 3-4 OZ DE JUGO O REFRESCO REGULAR O - UN TUBO DE GEL DE GLUCOSA  - PASO 2: REVISAR el nivel de azcar en sangre en 15 MINUTOS PASO 3: Si su nivel de azcar en sangre todava est bajo en la nueva revisin de 15 minutos --> entonces, regrese al PASO 1 y trtelo OTRA VEZ con otros 15 gramos de carbohidratos.      HOW TO TREAT LOW BLOOD SUGARS (Blood sugar LESS THAN 70 MG/DL) Please follow the RULE OF 15 for the treatment of hypoglycemia treatment (when your (blood sugars are less than 70 mg/dL)   STEP 1: Take 15 grams of carbohydrates when your blood sugar is low, which includes:  3-4 GLUCOSE TABS  OR 3-4 OZ OF JUICE OR REGULAR SODA OR ONE TUBE OF GLUCOSE GEL    STEP 2: RECHECK blood sugar in 15 MINUTES STEP 3: If your blood sugar is still low at the 15 minute recheck --> then, go back to STEP 1 and treat AGAIN with another 15 grams of carbohydrates.

## 2023-01-19 NOTE — Progress Notes (Signed)
Name: Samuel [name]  Age/ Sex: 72 y.o., male   MRN/ DOB: [id number], 06-10-1951     PCP: Elise Benne   Reason for Endocrinology Evaluation: Type 2 Diabetes Mellitus  Initial Endocrine Consultative Visit: 07/24/2014    PATIENT IDENTIFIER: Samuel [name] is a 72 y.o. male with a past medical history of DM, HTN and dyslipidemia. The patient has followed with Endocrinology clinic since 07/24/2014 for consultative assistance with management of his diabetes.  DIABETIC HISTORY:  Samuel [name] was diagnosed with DM 1990, he has been on insulin for many years. His hemoglobin A1c has ranged from 6.8% in 2022, peaking at 7.9% in 2019.   SUBJECTIVE:   During the last visit (09/06/2022): Saw [name]  Today (01/19/2023): Samuel [name] is here for follow-up on his diabetes management while [name] is not available.   An interpreter line has been used today   He checks his blood sugars multiple times daily through CGM. The patient has  had hypoglycemic episodes since the last clinic visit.The pt is symptomatic during these symptoms    Denies nausea, or abdominal pain    This patient with type 2 diabetes is treated with OmniPod (insulin pump). During the visit the pump basal and bolus doses were reviewed including carb/insulin rations and supplemental doses. The clinical list was updated. The glucose meter download was reviewed in detail to determine if the current pump settings are providing the best glycemic control without excessive hypoglycemia.  Pump and meter download:    Pump   OmniPod Settings   Insulin type   Humalog   Basal rate       0000 0.9   0230 2.0   0800 1.65      I:C ratio       0000 1:1                  Sensitivity       0000  25      Goal       0000  120            PUMP STATISTICS: Average BG: no data  Average Daily Carbs (g): no data Average Total Daily Insulin: 82.4  Average Daily Basal: 36 (44 %) Average Daily Bolus: 46.4 (56  %)     HOME DIABETES REGIMEN:  Trulicity 4.5 mg weekly Synjardy XR 03/999, 2 tabs daily     Statin: Yes ACE-I/ARB: Yes Prior Diabetic Education: Yes   CONTINUOUS GLUCOSE MONITORING RECORD INTERPRETATION    Dates of Recording: 2/29-3/13/2024  Sensor description: freestyle libre  Results statistics:   CGM use % of time 68  Average and SD 138/31.5  Time in range     82   %  % Time Above 180 14  % Time above 250 1  % Time Below target 3   Glycemic patterns summary: Hypoglycemia noted at night, with fluctuating BG's at night   Hyperglycemic episodes  postprandial   Hypoglycemic episodes occurred at night and post bolus   Overnight periods: trends down      DIABETIC COMPLICATIONS: Microvascular complications:  Neuropathy Denies:  Last Eye Exam: Completed   Macrovascular complications:  Denies: CAD, CVA, PVD   HISTORY:  Past Medical History:  Past Medical History:  Diagnosis Date   Blood transfusion without reported diagnosis    Diabetes mellitus without complication ([place])    type II   History of ETOH abuse    Hypertension    Past Surgical  History:  Past Surgical History:  Procedure Laterality Date   BACK SURGERY N/A 2010   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   Procedure: LEFT TOTAL KNEE ARTHROPLASTY;  Surgeon: Mcarthur Rossetti, MD;  Location: [facility];  Service: Orthopedics;  Laterality: Left;   Social History:  reports that he has quit smoking. He quit smokeless tobacco use about 29 years ago. He reports that he does not drink alcohol and does not use drugs. Family History:  Family History  Problem Relation Age of Onset   Diabetes Unknown    Hypertension Unknown    Arthritis Father    Diabetes Father      HOME MEDICATIONS: Allergies as of 01/19/2023   No Known Allergies      Medication List        Accurate as of January 19, 2023  1:35 PM. If you have any questions, ask your nurse or doctor.          STOP taking these  medications    benzonatate 100 MG capsule Commonly known as: TESSALON Stopped by: Dorita Sciara, MD       TAKE these medications    aspirin 81 MG chewable tablet Chew 1 tablet (81 mg total) by mouth 2 (two) times daily.   atorvastatin 40 MG tablet Commonly known as: LIPITOR 1 tab po am and 1/2 tab po q day   diclofenac 75 MG EC tablet Commonly known as: VOLTAREN TAKE 1 TABLET(75 MG) BY MOUTH TWICE DAILY   famotidine 20 MG tablet Commonly known as: PEPCID Take 1 tablet (20 mg total) by mouth daily.   fluticasone 50 MCG/ACT nasal spray Commonly known as: FLONASE SHAKE LIQUID AND USE 2 SPRAYS IN EACH NOSTRIL DAILY   FreeStyle Libre 2 Sensor Misc 2 Devices by Does not apply route every 14 (fourteen) days.   FREESTYLE LITE test strip Generic drug: glucose blood USE AS INSTRUCTED FOUR TIMES DAILY   HYDROcodone-acetaminophen 5-325 MG tablet Commonly known as: NORCO/VICODIN Take 1-2 tablets by mouth every 6 (six) hours as needed for moderate pain.   insulin lispro 100 UNIT/ML injection Commonly known as: HumaLOG INJECT UP TO 90 UNITS UNDER THE SKIN VIA INSULIN PUMP DAILY   Insulin Pen Needle 32G X 4 MM Misc Use 5 pens per day to inject insulin   B-D UF III MINI PEN NEEDLES 31G X 5 MM Misc Generic drug: Insulin Pen Needle USE FIVE PER DAY AS DIRECTED   INSULIN SYRINGE .5CC/30GX5/16" 30G X 5/16" 0.5 ML Misc Use 3 times a day for insulin   losartan 50 MG tablet Commonly known as: COZAAR TAKE 1 TABLET(50 MG) BY MOUTH DAILY   methocarbamol 500 MG tablet Commonly known as: ROBAXIN TAKE 1 TABLET(500 MG) BY MOUTH EVERY 6 HOURS AS NEEDED FOR MUSCLE SPASMS   NON FORMULARY Take 2 capsules by mouth daily. 4LIFE TRANSFER FACTORY CARDIO   nystatin-triamcinolone ointment Commonly known as: MYCOLOG Apply 1 Application topically 2 (two) times daily.   Omnipod DASH Intro (Gen 4) Kit Change every 3 days   Omnipod DASH Pods (Gen 4) Misc USE FOR INSULIN PUMP EVEY 3  DAYS   OVER THE COUNTER MEDICATION Take 2 tablets by mouth daily. 4LIFE TRANSFER FACTORY RECALL   Synjardy XR 03-999 MG Tb24 Generic drug: Empagliflozin-metFORMIN HCl ER TAKE 2 TABLETS BY MOUTH DAILY   Trulicity 4.5 0000000 Sopn Generic drug: Dulaglutide Inject 4.5 mg as directed once a week.         OBJECTIVE:   Vital Signs:  BP 136/82 (BP Location: Left Arm, Patient Position: Sitting, Cuff Size: Large)   Pulse 71   Ht '5\' 3"'$  (1.6 m)   Wt 203 lb (92.1 kg)   SpO2 94%   BMI 35.96 kg/m   Wt Readings from Last 3 Encounters:  01/19/23 203 lb (92.1 kg)  12/29/22 206 lb (93.4 kg)  11/26/22 206 lb 6.4 oz (93.6 kg)     Exam: General: Pt appears well and is in NAD  Lungs: Clear with good BS bilat   Heart: RRR   Extremities: No pretibial edema.   Neuro: MS is good with appropriate affect, pt is alert and Ox3    DM foot exam: 01/19/2023  The skin of the feet is intact without sores or ulcerations. The pedal pulses are 2+ on right and 2+ on left. The sensation is intact to a screening 5.07, 10 gram monofilament bilaterally          DATA REVIEWED:  Lab Results  Component Value Date   HGBA1C 7.2 (A) 01/19/2023   HGBA1C 7.1 (A) 09/06/2022   HGBA1C 7.4 (A) 05/24/2022     Latest Reference Range & Units 11/26/22 16:23  Sodium 135 - 146 mmol/L 141  Potassium 3.5 - 5.3 mmol/L 4.3  Chloride 98 - 110 mmol/L 105  CO2 20 - 32 mmol/L 27  Glucose 65 - 99 mg/dL 150 (H)  BUN 7 - 25 mg/dL 22  Creatinine 0.70 - 1.28 mg/dL 0.87  Calcium 8.6 - 10.3 mg/dL 9.1  BUN/Creatinine Ratio 6 - 22 (calc) SEE NOTE:  AG Ratio 1.0 - 2.5 (calc) 1.9  AST 10 - 35 U/L 17  ALT 9 - 46 U/L 20  Total Protein 6.1 - 8.1 g/dL 6.6  Total Bilirubin 0.2 - 1.2 mg/dL 0.5    Old records , labs and images have been reviewed.    ASSESSMENT / PLAN / RECOMMENDATIONS:   1) Type 2 Diabetes Mellitus, Sub-Optimally controlled, With neuropathic complications - Most recent A1c of 7.2 %. Goal A1c < 7.0 %.      - A1c overall stable  - Pt has been noted with hypoglycemia overnight, BG as low as 40 mg/dL but the pt did not have any symptoms nor did her hear the alert, will reduce basal rate starting at 2:30 am  - NO changes to Trulicity or Synjardy     MEDICATIONS: Continue Trulicity 4.5 mg weekly Continue Synjardy 03/999, 2 tabs daily Continue Humalog per pump  Pump   OmniPod Settings   Insulin type   Humalog   Basal rate       0000 0.9   0230 1.9   0800 1.65      I:C ratio       0000 1:1   Breakfast 16-18   Lunch 14-18   Supper 10-16      Sensitivity       0000  25      Goal       0000  110     EDUCATION / INSTRUCTIONS: BG monitoring instructions: Patient is instructed to check his blood sugars 3 times a day, before meals. Call [facility] if: BG persistently < 70  I reviewed the Rule of 15 for the treatment of hypoglycemia in detail with the patient. Literature supplied.    2) Diabetic complications:  Eye: Does not have known diabetic retinopathy.  Neuro/ Feet: Does not have known diabetic peripheral neuropathy .  Renal: Patient does not have known baseline CKD. He  is  on an ACEI/ARB at present.     F/U in 4 months    Signed electronically by: Mack Guise, MD  [facility] Endocrin[facility]  [facility] [address] [place], [place] [address] Phone: [phone] FAX: [phone]   CC: Elise Benne [id number] [place] [address] Phone: [phone]  Fax: [phone]  Return to Endocrinology clinic as below: No future appointments.

## 2023-01-20 ENCOUNTER — Other Ambulatory Visit: Payer: Self-pay

## 2023-01-20 DIAGNOSIS — E1165 Type 2 diabetes mellitus with hyperglycemia: Secondary | ICD-10-CM

## 2023-01-20 MED ORDER — "INSULIN SYRINGE 30G X 5/16"" 0.5 ML MISC": 1 refills | Status: DC

## 2023-01-24 ENCOUNTER — Telehealth: Payer: Self-pay | Admitting: Endocrinology

## 2023-01-24 NOTE — Telephone Encounter (Signed)
Pt's daughter also confirmed that he is taking Trulicity as well as using Humalog in his pump. Denies any change in diet/exercise.

## 2023-01-24 NOTE — Telephone Encounter (Signed)
Daughter called to advise that patient is still having hypoglycemic episodes since the last clinic visit.The patient is symptomatic during these low blood sugar episode.  Lows ranging between 50-80.  Daughter request assistance with helping her Dad (she acts as his interpretor) .  Requesting call back to Beverlee Nims at [phone] or appointment with MD.

## 2023-01-24 NOTE — Telephone Encounter (Signed)
Pt's daughter agrees to come see Vaughan Basta within the next day or 2. Pt also transferred to front to r/s earlier

## 2023-01-26 ENCOUNTER — Other Ambulatory Visit: Payer: Self-pay

## 2023-01-26 DIAGNOSIS — E1165 Type 2 diabetes mellitus with hyperglycemia: Secondary | ICD-10-CM

## 2023-01-26 MED ORDER — OMNIPOD DASH PODS (GEN 4) MISC: 5 refills | Status: DC

## 2023-01-28 DIAGNOSIS — E1165 Type 2 diabetes mellitus with hyperglycemia: Secondary | ICD-10-CM | POA: Diagnosis not present

## 2023-02-02 ENCOUNTER — Encounter: Payer: Self-pay | Admitting: Endocrinology

## 2023-02-02 ENCOUNTER — Ambulatory Visit: Payer: Medicare Other | Admitting: Endocrinology

## 2023-02-02 VITALS — BP 118/62 | HR 78 | Ht 63.0 in | Wt 204.0 lb

## 2023-02-02 DIAGNOSIS — E1165 Type 2 diabetes mellitus with hyperglycemia: Secondary | ICD-10-CM | POA: Diagnosis not present

## 2023-02-02 DIAGNOSIS — Z794 Long term (current) use of insulin: Secondary | ICD-10-CM

## 2023-02-02 DIAGNOSIS — I10 Essential (primary) hypertension: Secondary | ICD-10-CM | POA: Diagnosis not present

## 2023-02-02 NOTE — Progress Notes (Signed)
Patient ID: Samuel [name], male   DOB: [birth year], 72 y.o.   MRN: [id number]            Reason for Appointment:  Followup for Type 2 Diabetes   History of Present Illness:          Diagnosis: Type 2 diabetes mellitus, date of diagnosis:  1990      Past history: He thinks he has been on insulin for the last 10-12 years. Previously had been on metformin which has been continued. He was probably tried on different insulin regimens initially but has been taking 70/30 mostly because of cost Previous records are not available and appears that his sugars have been poorly controlled for several years He was  referred here because of an A1c in 8/15 of 9.7%. He was on a regimen of Novolin 70/30 twice a day but because of inadequate control and higher readings later in the day he was switched to basal bolus insulin regimen in 12/15. Previous insulin regimen: Humalog  ac meals 35-40-30 Tou[place] 35-40 units daily  Recent history:   INSULIN regimen is: OMNIPOD 4 INSULIN PUMP  Basal rate settings: Midnight = 0.9, 2.30am: 2.0. 8 a.m. = 1.65 Boluses 16/18 units breakfast -14/18 units lunch and 10-16 units at dinner at meals Total daily insulin up to 100 units  Carbohydrate coverage 1:10 and correction 1:50   Non-insulin hypoglycemic drugs:   Trulicity 3 mg weekly, Synjardy XR 03/999, 2 tablets daily  A1c is  7.1  Current management, blood sugar patterns and problems identified: He is using the Dash pump Despite instructions several times still does not enter his blood sugars or carbohydrates when he is bolusing Also he says he is still afraid of low blood sugars and will not bolus at the start of the meal and mostly waiting till it goes up Most of his postprandial spikes are related to late boluses especially at breakfast or lunch He continues to have a dawn phenomenon despite adjusting his basal rate early morning and highest blood sugars are generally between 8-10 AM He says he is benefiting  somewhat from going up to 3 mg Trulicity with better satiety but his weight is down only a couple of pounds As before he does not walk much for exercise He does monitor the blood sugars fairly frequently and still using the original freestyle libre He is adjusting his boluses based on his meal size or blood sugar and difficult to get a good history  His mealtimes are generally variable, evening meal 5-7 PM       Side effects from medications have been: ?  Nausea/dizziness on Victoza  Interpretation of the download from his freestyle libre 14-day CGM shows the following  His highest blood sugars over midmorning and lowest blood sugars from 4-6 AM Overnight blood sugars are relatively well controlled in the low 100 range but start increasing higher at about 5-6 AM prior to breakfast Premeal blood sugars are still around 160 at lunchtime and 140-150 for dinner with mild variability POSTPRANDIAL readings: After breakfast blood sugars are only rising modestly as they are usually mildly increased before the meal and periodically will go up well over 200s with AVERAGE 179 Blood sugars after lunch are quite variable and may periodically show postprandial spikes but occasionally may be lower After dinner blood sugars are more consistent and not rising excessively with highest readings only about 200 No hypoglycemia seen, rarely low normal sugars midday  CGM use % of time 97  2-week average/GV 153/24  Time in range 75       %  % Time Above 180 24  % Time above 250 1  % Time Below 70      Previous data:  CGM use % of time 98  2-week average/GV 158/27  Time in range    72   %  % Time Above 180 26+2  % Time above 250   % Time Below 70 0     PRE-MEAL Fasting Lunch Dinner Bedtime Overall  Glucose range:       Averages: 150    158   POST-MEAL PC Breakfast PC Lunch PC Dinner  Glucose range:     Averages: 184 176 176    Prior   CGM use % of time   2-week average/GV 150  Time in range      83   %  % Time Above 180 15+2  % Time above 250   % Time Below 70      PRE-MEAL Fasting Lunch Dinner Bedtime Overall  Glucose range:       Averages: 141 148 140 146    POST-MEAL PC Breakfast PC Lunch PC Dinner  Glucose range:     Averages: 182 154 158       Dietician visit, most recent: 5/16 Last CDE visit: 12/2016            Weight history: Previous range 200-220  Wt Readings from Last 3 Encounters:  02/02/23 204 lb (92.5 kg)  01/19/23 203 lb (92.1 kg)  12/29/22 206 lb (93.4 kg)    Glycemic control:   Lab Results  Component Value Date   HGBA1C 7.2 (A) 01/19/2023   HGBA1C 7.1 (A) 09/06/2022   HGBA1C 7.4 (A) 05/24/2022   Lab Results  Component Value Date   MICROALBUR <0.7 05/24/2022   LDLCALC 17 08/24/2022   CREATININE 0.87 11/26/2022    Lab Results  Component Value Date   FRUCTOSAMINE 302 (H) 07/20/2017       Allergies as of 02/02/2023   No Known Allergies      Medication List        Accurate as of February 02, 2023 11:58 AM. If you have any questions, ask your nurse or doctor.          aspirin 81 MG chewable tablet Chew 1 tablet (81 mg total) by mouth 2 (two) times daily.   atorvastatin 40 MG tablet Commonly known as: LIPITOR 1 tab po am and 1/2 tab po q day   diclofenac 75 MG EC tablet Commonly known as: VOLTAREN TAKE 1 TABLET(75 MG) BY MOUTH TWICE DAILY   famotidine 20 MG tablet Commonly known as: PEPCID Take 1 tablet (20 mg total) by mouth daily.   fluticasone 50 MCG/ACT nasal spray Commonly known as: FLONASE SHAKE LIQUID AND USE 2 SPRAYS IN EACH NOSTRIL DAILY   FreeStyle Libre 2 Sensor Misc 2 Devices by Does not apply route every 14 (fourteen) days.   FREESTYLE LITE test strip Generic drug: glucose blood USE AS INSTRUCTED FOUR TIMES DAILY   HYDROcodone-acetaminophen 5-325 MG tablet Commonly known as: NORCO/VICODIN Take 1-2 tablets by mouth every 6 (six) hours as needed for moderate pain.   insulin lispro 100 UNIT/ML  injection Commonly known as: HumaLOG Max daily 100 units per pump   Insulin Pen Needle 32G X 4 MM Misc Use 5 pens per day to inject insulin   B-D UF III MINI PEN NEEDLES 31G X 5 MM Misc  Generic drug: Insulin Pen Needle USE FIVE PER DAY AS DIRECTED   INSULIN SYRINGE .5CC/30GX5/16" 30G X 5/16" 0.5 ML Misc Use 3 times a day for insulin   losartan 50 MG tablet Commonly known as: COZAAR TAKE 1 TABLET(50 MG) BY MOUTH DAILY   methocarbamol 500 MG tablet Commonly known as: ROBAXIN TAKE 1 TABLET(500 MG) BY MOUTH EVERY 6 HOURS AS NEEDED FOR MUSCLE SPASMS   NON FORMULARY Take 2 capsules by mouth daily. 4LIFE TRANSFER FACTORY CARDIO   nystatin-triamcinolone ointment Commonly known as: MYCOLOG Apply 1 Application topically 2 (two) times daily.   Omnipod DASH Intro (Gen 4) Kit Change every 3 days   Omnipod DASH Pods (Gen 4) Misc USE FOR INSULIN PUMP EVEY 3 DAYS   OVER THE COUNTER MEDICATION Take 2 tablets by mouth daily. 4LIFE TRANSFER FACTORY RECALL   Synjardy XR 03-999 MG Tb24 Generic drug: Empagliflozin-metFORMIN HCl ER Take 2 tablets by mouth daily.   Trulicity 4.5 0000000 Sopn Generic drug: Dulaglutide Inject 4.5 mg as directed once a week.        Allergies: No Known Allergies  Past Medical History:  Diagnosis Date   Blood transfusion without reported diagnosis    Diabetes mellitus without complication ([place])    type II   History of ETOH abuse    Hypertension     Past Surgical History:  Procedure Laterality Date   BACK SURGERY N/A 2010   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   Procedure: LEFT TOTAL KNEE ARTHROPLASTY;  Surgeon: Mcarthur Rossetti, MD;  Location: [facility];  Service: Orthopedics;  Laterality: Left;    Family History  Problem Relation Age of Onset   Diabetes Unknown    Hypertension Unknown    Arthritis Father    Diabetes Father     Social History:  reports that he has quit smoking. He quit smokeless tobacco use about 29 years ago. He  reports that he does not drink alcohol and does not use drugs.    Review of Systems         Lipids:  He has been on Lipitor 40 mg from his PCP for hypercholesterolemia since about 2011, lipid levels as below        Lab Results  Component Value Date   CHOL 96 08/24/2022   HDL 38.70 (L) 08/24/2022   LDLCALC 17 08/24/2022   LDLDIRECT 44.0 01/24/2020   TRIG 199.0 (H) 08/24/2022   CHOLHDL 2 08/24/2022    Hypertension: Controlled Has been treated with losartan 50 mg, prescribed by PCP Also on Jardiance   BP Readings from Last 3 Encounters:  02/02/23 118/62  01/19/23 136/82  12/29/22 (!) 140/80     Physical Examination:  BP 118/62   Pulse 78   Ht 5\' 3"  (1.6 m)   Wt 204 lb (92.5 kg)   SpO2 95%   BMI 36.14 kg/m    ASSESSMENT:  Diabetes type 2, with obesity and BMI of around 36  See history of present illness for  description of current diabetes management, blood sugar patterns and problems identified..  His diabetes is being managed with Humalog in the Omnipod 4 insulin pump, Synjardy and Trulicity 1.5 mg  His [id number] is 7.1 and slightly better Blood sugars are overall reasonably controlled and 75% within target range on his sensor Main difficulty is not timing the bolus before the meals and having a dawn phenomenon Also he is not entering his blood sugars in the pump when he needs to bolus Discussed that late  boluses may also tend to cause low normal sugars before the next meal especially lunch  Currently using original freestyle libre and needs to upgrade to the Rodman 3 if able to  HYPERTENSION: Followed by PCP, blood pressure relatively higher today    PLAN:   For now we will continue the same basal rate However stressed the importance of trying to bolus at the start of the meal and the fact that the pump will reduce the bolus if he has a low normal reading He should preferably bolus ahead of time before breakfast when his blood sugars are high Showed him how  to enter the glucose reading in the pump when he is bolusing Walk as much as possible  Today's visit including counseling and evaluation and management duration = 30 minutes  There are no Patient Instructions on file for this visit.   Elayne Snare 02/02/2023, 11:58 AM   Note: This office note was prepared with Dragon voice recognition system technology. Any transcriptional errors that result from this process are unintentional. May

## 2023-02-02 NOTE — Patient Instructions (Signed)
Always bolus before eatimg  Have egg or cheese at breakfast

## 2023-02-03 ENCOUNTER — Encounter: Payer: Self-pay | Admitting: Endocrinology

## 2023-02-16 NOTE — Telephone Encounter (Signed)
Daughter was contacted and we scheduled an appontment for Monday

## 2023-02-18 ENCOUNTER — Encounter: Payer: Self-pay | Admitting: Endocrinology

## 2023-02-21 ENCOUNTER — Ambulatory Visit: Payer: Medicare Other | Admitting: Nutrition

## 2023-04-08 ENCOUNTER — Ambulatory Visit (INDEPENDENT_AMBULATORY_CARE_PROVIDER_SITE_OTHER): Payer: Medicare Other | Admitting: Medical

## 2023-04-08 VITALS — BP 128/64 | HR 71 | Temp 98.0°F | Resp 18 | Ht 63.0 in | Wt 201.0 lb

## 2023-04-08 DIAGNOSIS — E785 Hyperlipidemia, unspecified: Secondary | ICD-10-CM

## 2023-04-08 DIAGNOSIS — Z794 Long term (current) use of insulin: Secondary | ICD-10-CM | POA: Diagnosis not present

## 2023-04-08 DIAGNOSIS — R432 Parageusia: Secondary | ICD-10-CM

## 2023-04-08 DIAGNOSIS — I10 Essential (primary) hypertension: Secondary | ICD-10-CM | POA: Diagnosis not present

## 2023-04-08 DIAGNOSIS — E119 Type 2 diabetes mellitus without complications: Secondary | ICD-10-CM | POA: Diagnosis not present

## 2023-04-08 DIAGNOSIS — Z8719 Personal history of other diseases of the digestive system: Secondary | ICD-10-CM | POA: Diagnosis not present

## 2023-04-08 MED ORDER — FAMOTIDINE 20 MG PO TABS: 20.0000 mg | ORAL_TABLET | Freq: Every day | ORAL | 2 refills | Status: DC

## 2023-04-08 MED ORDER — FAMOTIDINE 20 MG PO TABS: 20.0000 mg | ORAL_TABLET | Freq: Every day | ORAL | 0 refills | Status: DC

## 2023-04-08 NOTE — Progress Notes (Addendum)
Subjective:    Patient ID: Samuel [name], male    DOB: [birth year], 72 y.o.   MRN: [id number]  HPI  Pt in for follow up.  Pt states last month or more  has some sour taste in his mouth. He denies any recent reflux. He is not burping. No abd pain. No neck pain.   Stats has sour taste in mouth all day long. Worse in the morning.  On review I don't see recent ppi or famotdine. But back in 2022 did rx famotadine.   Pt has denture upper mouth. He removes and cleans after every meal.  Diabetes- pt continue to follow up with Dr. Lucianne Muss.  Review of Systems  Constitutional:  Negative for appetite change, chills, fatigue and unexpected weight change.  HENT:         Sour taste in mouth.  Respiratory:  Negative for cough, chest tightness, shortness of breath and wheezing.   Cardiovascular:  Negative for chest pain and palpitations.  Gastrointestinal:  Negative for abdominal pain.       History of reflux.  Musculoskeletal:  Negative for arthralgias, back pain and gait problem.   Past Medical History:  Diagnosis Date   Blood transfusion without reported diagnosis    Diabetes mellitus without complication (HCC)    type II   History of ETOH abuse    Hypertension      Social History   Socioeconomic History   Marital status: Married    Spouse name: Not on file   Number of children: Not on file   Years of education: Not on file   Highest education level: Not on file  Occupational History   Not on file  Tobacco Use   Smoking status: Former   Smokeless tobacco: Former    Quit date: 11/08/1993  Vaping Use   Vaping Use: Never used  Substance and Sexual Activity   Alcohol use: No   Drug use: No   Sexual activity: Not on file  Other Topics Concern   Not on file  Social History Narrative   Not on file   Social Determinants of Health   Financial Resource Strain: Not on file  Food Insecurity: Not on file  Transportation Needs: Not on file  Physical Activity: Not on file   Stress: Not on file  Social Connections: Not on file  Intimate Partner Violence: Not on file    Past Surgical History:  Procedure Laterality Date   BACK SURGERY N/A 2010   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   Procedure: LEFT TOTAL KNEE ARTHROPLASTY;  Surgeon: Kathryne Hitch, MD;  Location: [facility];  Service: Orthopedics;  Laterality: Left;    Family History  Problem Relation Age of Onset   Diabetes Unknown    Hypertension Unknown    Arthritis Father    Diabetes Father     No Known Allergies  Current Outpatient Medications on File Prior to Visit  Medication Sig Dispense Refill   aspirin 81 MG chewable tablet Chew 1 tablet (81 mg total) by mouth 2 (two) times daily. 60 tablet 0   atorvastatin (LIPITOR) 40 MG tablet 1 tab po am and 1/2 tab po q day 135 tablet 1   B-D UF III MINI PEN NEEDLES 31G X 5 MM MISC USE FIVE PER DAY AS DIRECTED 200 each 0   Continuous Blood Gluc Sensor (FREESTYLE LIBRE 2 SENSOR) MISC 2 Devices by Does not apply route every 14 (fourteen) days. 2 each 3   diclofenac (VOLTAREN)  75 MG EC tablet TAKE 1 TABLET(75 MG) BY MOUTH TWICE DAILY 60 tablet 1   Dulaglutide (TRULICITY) 4.5 MG/0.5ML SOPN Inject 4.5 mg as directed once a week. 6 mL 3   Empagliflozin-metFORMIN HCl ER (SYNJARDY XR) 03-999 MG TB24 Take 2 tablets by mouth daily. 180 tablet 3   famotidine (PEPCID) 20 MG tablet Take 1 tablet (20 mg total) by mouth daily. 30 tablet 0   fluticasone (FLONASE) 50 MCG/ACT nasal spray SHAKE LIQUID AND USE 2 SPRAYS IN EACH NOSTRIL DAILY 48 g 0   glucose blood (FREESTYLE LITE) test strip USE AS INSTRUCTED FOUR TIMES DAILY 150 strip 2   HYDROcodone-acetaminophen (NORCO/VICODIN) 5-325 MG tablet Take 1-2 tablets by mouth every 6 (six) hours as needed for moderate pain. 30 tablet 0   Insulin Disposable Pump (OMNIPOD DASH INTRO, GEN 4,) KIT Change every 3 days 1 kit 0   Insulin Disposable Pump (OMNIPOD DASH PODS, GEN 4,) MISC USE FOR INSULIN PUMP EVEY 3 DAYS 30 each  5   insulin lispro (HUMALOG) 100 UNIT/ML injection Max daily 100 units per pump 100 mL 3   Insulin Pen Needle 32G X 4 MM MISC Use 5 pens per day to inject insulin 200 each 3   Insulin Syringe-Needle U-100 (INSULIN SYRINGE .5CC/30GX5/16") 30G X 5/16" 0.5 ML MISC Use 3 times a day for insulin 300 each 1   losartan (COZAAR) 50 MG tablet TAKE 1 TABLET(50 MG) BY MOUTH DAILY 90 tablet 1   methocarbamol (ROBAXIN) 500 MG tablet TAKE 1 TABLET(500 MG) BY MOUTH EVERY 6 HOURS AS NEEDED FOR MUSCLE SPASMS 40 tablet 1   NON FORMULARY Take 2 capsules by mouth daily. 4LIFE TRANSFER FACTORY CARDIO     nystatin-triamcinolone ointment (MYCOLOG) Apply 1 Application topically 2 (two) times daily. 30 g 1   OVER THE COUNTER MEDICATION Take 2 tablets by mouth daily. 4LIFE TRANSFER FACTORY RECALL     No current facility-administered medications on file prior to visit.    BP 128/64   Pulse 71   Temp 98 F (36.7 C)   Resp 18   Ht 5\' 3"  (1.6 m)   Wt 201 lb (91.2 kg)   SpO2 97%   BMI 35.61 kg/m        Objective:   Physical Exam  General Mental Status- Alert. General Appearance- Not in acute distress.   Skin General: Color- Normal Color. Moisture- Normal Moisture.  Neck Carotid Arteries- Normal color. Moisture- Normal Moisture. No carotid bruits. No JVD.  Chest and Lung Exam Auscultation: Breath Sounds:-Normal.  Cardiovascular Auscultation:Rythm- Regular. Murmurs & Other Heart Sounds:Auscultation of the heart reveals- No Murmurs.  Abdomen Inspection:-Inspeection Normal. Palpation/Percussion:Note:No mass. Palpation and Percussion of the abdomen reveal- Non Tender, Non Distended + BS, no rebound or guarding.  Neurologic Cranial Nerve exam:- CN III-XII intact(No nystagmus), symmetric smile. Strength:- 5/5 equal and symmetric strength both upper and lower extremities.   Mouth- on inspection no abnormality. No white dc buccal mucusa. Doe have dentures in place.    Assessment & Plan:   Patient  Instructions  1. Hyperlipidemia, unspecified hyperlipidemia type Future lipid panel fasting in October.  2. Essential hypertension, benign Blood pressure well controlled.  3. Type 2 diabetes mellitus without complication, with long-term current use of insulin (HCC) Continue current regimen by Dr. Lucianne Muss diabetes is being managed with Humalog in the Omnipod 4 insulin pump, Synjardy and Trulicity 1.5 mg   4. Abnormal taste in mouth with History of esophageal reflux. No overt reflux but best to  rx famotadine to assess response. If symptoms persisted consider referral to ENT  Follow up October for check up (schedule early morning and come in fasting)but sooner if needed.     Esperanza Richters, PA-C

## 2023-04-08 NOTE — Patient Instructions (Addendum)
1. Hyperlipidemia, unspecified hyperlipidemia type Future lipid panel fasting in October.  2. Essential hypertension, benign Blood pressure well controlled.  3. Type 2 diabetes mellitus without complication, with long-term current use of insulin (HCC) Continue current regimen by Dr. Lucianne Muss diabetes is being managed with Humalog in the Omnipod 4 insulin pump, Synjardy and Trulicity 1.5 mg   4. Abnormal taste in mouth with History of esophageal reflux. No overt reflux but best to rx famotadine to assess response. If symptoms persisted consider referral to ENT  Follow up October for check up (schedule early morning and come in fasting)but sooner if needed.

## 2023-04-26 DIAGNOSIS — L570 Actinic keratosis: Secondary | ICD-10-CM | POA: Diagnosis not present

## 2023-05-06 ENCOUNTER — Other Ambulatory Visit: Payer: Self-pay

## 2023-05-06 DIAGNOSIS — E1165 Type 2 diabetes mellitus with hyperglycemia: Secondary | ICD-10-CM

## 2023-05-06 MED ORDER — FREESTYLE LIBRE 2 SENSOR MISC: 2.0000 | 3 refills | Status: DC

## 2023-05-10 DIAGNOSIS — E1165 Type 2 diabetes mellitus with hyperglycemia: Secondary | ICD-10-CM | POA: Diagnosis not present

## 2023-05-19 ENCOUNTER — Other Ambulatory Visit: Payer: Medicare Other

## 2023-05-21 ENCOUNTER — Other Ambulatory Visit: Payer: Self-pay | Admitting: Medical

## 2023-05-23 ENCOUNTER — Ambulatory Visit: Payer: Medicare Other | Admitting: Endocrinology

## 2023-05-30 ENCOUNTER — Encounter: Payer: Self-pay | Admitting: Endocrinology

## 2023-06-06 ENCOUNTER — Ambulatory Visit: Payer: Medicare Other | Admitting: Physician Assistant

## 2023-06-13 ENCOUNTER — Ambulatory Visit: Payer: Medicare Other | Admitting: Physician Assistant

## 2023-06-13 ENCOUNTER — Other Ambulatory Visit: Payer: Self-pay

## 2023-06-13 ENCOUNTER — Encounter: Payer: Self-pay | Admitting: Physician Assistant

## 2023-06-13 VITALS — Ht 64.0 in | Wt 203.0 lb

## 2023-06-13 DIAGNOSIS — M1711 Unilateral primary osteoarthritis, right knee: Secondary | ICD-10-CM

## 2023-06-13 DIAGNOSIS — M25561 Pain in right knee: Secondary | ICD-10-CM

## 2023-06-13 DIAGNOSIS — G8929 Other chronic pain: Secondary | ICD-10-CM

## 2023-06-13 NOTE — Progress Notes (Signed)
HPI: Samuel [name] 72 year old male well-known to Dr. Magnus Ivan service.  History of left total knee arthroplasty September 2022.  Left knee doing well.  He continues to have right knee pain despite conservative measures which have included injections, NSAIDs and activity modifications.  He states that he cannot walk rapidly, lift heavy objects or ride in a car for a very long time due to the right knee pain.  He describes his right knee pain to be constant and 8 out of 10 pain at worst.  He does not use any assistive device to ambulate.  He has diabetic and reports good control of his diabetes.  He denies any fevers, chills, ongoing infection, chest pain or shortness of breath.  Review of systems: See HPI otherwise negative  Physical exam: General well-developed well-nourished male in no acute distress mood and affect appropriate.  Psych alert and oriented x 3. Bilateral knees: Left knee full range of motion without pain.  Right knee slight hyper extension.  Full flexion right knee.  No abnormal warmth erythema right knee.  Patellofemoral crepitus with passive range of motion of the right knee.  Tenderness along medial lateral joint line right knee.  Right calf supple nontender dorsiflexion plantarflexion right ankle intact.  Radiographs: Right knee 2 views: No acute fracture knee is well located.  Mild patellofemoral and moderate to severe medial compartmental narrowing.  Mild lateral compartmental changes.  Otherwise no bony lesions or abnormalities.  Impression: Right knee arthritis  Plan: Discussed with patient that his right knee condition and discussed conservative measures.  He states he does not wish to proceed with conservative measures and would like to undergo a right total knee arthroplasty.  Risk benefits were reviewed with patient.  He is well aware of the postoperative protocol undergoing a left knee replacement 2 years ago.  Questions were encouraged and answered today at length using an  interpreter as patient speaks [place].  Will work on setting him up for right total knee arthroplasty in the near future.

## 2023-06-15 ENCOUNTER — Encounter: Payer: Self-pay | Admitting: Endocrinology

## 2023-06-15 ENCOUNTER — Ambulatory Visit: Payer: Medicare Other | Admitting: Endocrinology

## 2023-06-15 VITALS — BP 118/64 | HR 80 | Ht 64.0 in | Wt 202.2 lb

## 2023-06-15 DIAGNOSIS — E1165 Type 2 diabetes mellitus with hyperglycemia: Secondary | ICD-10-CM | POA: Diagnosis not present

## 2023-06-15 DIAGNOSIS — Z794 Long term (current) use of insulin: Secondary | ICD-10-CM | POA: Diagnosis not present

## 2023-06-15 LAB — MICROALBUMIN / CREATININE URINE RATIO
Creatinine,U: 47.6 mg/dL
Microalb Creat Ratio: 1.5 mg/g (ref 0.0–30.0)
Microalb, Ur: <0.7 mg/dL (ref 0.0–1.9)

## 2023-06-15 LAB — POCT GLYCOSYLATED HEMOGLOBIN (HGB A1C): Hemoglobin A1C: 7.4 % — AB (ref 4.0–5.6)

## 2023-06-15 NOTE — Progress Notes (Signed)
Outpatient Endocrinology Note Samuel Jennings Corado, MD  06/15/23  Patient Name: Samuel [name]   DOB : Mar 15, 1951 MRN # [id number]                                                    REASON OF VISIT: Follow up for type 2 diabetes mellitus  PCP: Esperanza Richters, PA-C  HISTORY OF PRESENT ILLNESS:   Samuel [name] is a 72 y.o. old male with past medical history listed below, is here for follow up for type 2 diabetes mellitus.   Pertinent Diabetes History:  _Diagnosed as type 2 in 1990.  Patient was initially treated with metformin and later insulin added.  He was mostly on insulin 70/30 regimen in the past.  At some point he was also on basal bolus insulin with Toujeo and Humalog.    OmniPod Dash insulin pump started in 2018/2019.  Chronic Diabetes Complications : Retinopathy: no. Last ophthalmology exam was done on annually, reportedly. Nephropathy: no, on losartan Peripheral neuropathy: no Coronary artery disease: no Stroke: no  Relevant comorbidities and cardiovascular risk factors: Obesity: yes Body mass index is 34.71 kg/m.  Hypertension: Yes Hyperlipidemia.  Yes, on statin.  Current / Home Diabetic regimen includes: Omnipod 4 / Dash /not with sensor.  He uses Humalog. Synjardy 03-999 mg 2 tab daily.  Trulicity 4.5mg  weekly.  Insulin Pump setting:  Basal MN-    0.9 u/hour 2:30AM- 1.8  8AM-    1.65         Bolus CHO Ratio (1unit:CHO) MN- 1:1  Correction/Sensitivity: MN- 1:25  Target: 130   Active insulin time: 4 hours  Prior diabetic medications: Insulin 70/30, Toujeo  INSULIN PUMP DATA:                         Average total daily insulin:  79.5 units, Basal: 48%, Bolus: 52%  He does not use sensor with OmniPod.  He does not do carb counting.  He uses fixed dose bolus from 10 to 18 units depending upon the blood sugar and food size with meals.   Glycemic data:    CONTINUOUS GLUCOSE MONITORING SYSTEM (CGMS) INTERPRETATION:  FreeStyle Libre CGM-  Sensor  Download (Sensor download was reviewed and summarized below.) Dates: July 25 2/7, 2024 Sensor Average: 139  Glucose Management Indicator: 6.6% Glucose Variability: 29.6%  % data captured: 97%  Glycemic Trends:  <54: 0% 54-70: 2% 71-180: 82% 181-250: 15% 251-400: 1%  Interpretation: -Most of the blood sugar are acceptable with occasional postprandial hyperglycemia.  On August 5 he had hypoglycemia around 10 PM related with higher dose of bolus insulin.  Mostly low normal to trending down blood sugar around bedtime .Rare hypoglycemia transient  mild during the daytime.  Hypoglycemia: Patient has hypoglycemic episodes. Patient has hypoglycemia awareness.    Factors modifying glucose control: 1.  Diabetic diet assessment: Almost always eats a bedtime snack.  Supper around 6 PM.  2.  Staying active or exercising: Started to walk.  3.  Medication compliance: compliant all of the time.  Interval history 06/15/23 In person interpreter for Spanish language and diabetes mellitus.  Patient is also accompanied by her daughter who also speaks [place].  Pump and CGM data as reviewed above.  Patient will possibly had knee surgery in coming months following with orthopedic.  He has been taking Trulicity and Synjardy as well.  Tolerating well.  No other complaints today.  REVIEW OF SYSTEMS As per history of present illness.   PAST MEDICAL HISTORY: Past Medical History:  Diagnosis Date   Blood transfusion without reported diagnosis    Diabetes mellitus without complication (HCC)    type II   History of ETOH abuse    Hypertension     PAST SURGICAL HISTORY: Past Surgical History:  Procedure Laterality Date   BACK SURGERY N/A 2010   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   Procedure: LEFT TOTAL KNEE ARTHROPLASTY;  Surgeon: Kathryne Hitch, MD;  Location: [facility];  Service: Orthopedics;  Laterality: Left;    ALLERGIES: No Known Allergies  FAMILY HISTORY:  Family History   Problem Relation Age of Onset   Diabetes Unknown    Hypertension Unknown    Arthritis Father    Diabetes Father     SOCIAL HISTORY: Social History   Socioeconomic History   Marital status: Married    Spouse name: Not on file   Number of children: Not on file   Years of education: Not on file   Highest education level: Not on file  Occupational History   Not on file  Tobacco Use   Smoking status: Former   Smokeless tobacco: Former    Quit date: 11/08/1993  Vaping Use   Vaping status: Never Used  Substance and Sexual Activity   Alcohol use: No   Drug use: No   Sexual activity: Not on file  Other Topics Concern   Not on file  Social History Narrative   Not on file   Social Determinants of Health   Financial Resource Strain: Not on file  Food Insecurity: Not on file  Transportation Needs: Not on file  Physical Activity: Not on file  Stress: Not on file  Social Connections: Unknown (03/19/2022)   [facility]   Social Network    Social Network: Not on file    MEDICATIONS:  Current Outpatient Medications  Medication Sig Dispense Refill   aspirin 81 MG chewable tablet Chew 1 tablet (81 mg total) by mouth 2 (two) times daily. 60 tablet 0   atorvastatin (LIPITOR) 40 MG tablet TAKE 1 TABLET BY MOUTH EVERY MORNING AND 1/2 AT BEDTIME 135 tablet 1   B-D UF III MINI PEN NEEDLES 31G X 5 MM MISC USE FIVE PER DAY AS DIRECTED 200 each 0   Continuous Glucose Sensor (FREESTYLE LIBRE 2 SENSOR) MISC 2 Devices by Does not apply route every 14 (fourteen) days. 2 each 3   diclofenac (VOLTAREN) 75 MG EC tablet TAKE 1 TABLET(75 MG) BY MOUTH TWICE DAILY 60 tablet 1   Dulaglutide (TRULICITY) 4.5 MG/0.5ML SOPN Inject 4.5 mg as directed once a week. 6 mL 3   Empagliflozin-metFORMIN HCl ER (SYNJARDY XR) 03-999 MG TB24 Take 2 tablets by mouth daily. 180 tablet 3   famotidine (PEPCID) 20 MG tablet Take 1 tablet (20 mg total) by mouth daily. 30 tablet 0   famotidine  (PEPCID) 20 MG tablet Take 1 tablet (20 mg total) by mouth daily. 30 tablet 0   famotidine (PEPCID) 20 MG tablet Take 1 tablet (20 mg total) by mouth daily. 30 tablet 2   fluticasone (FLONASE) 50 MCG/ACT nasal spray SHAKE LIQUID AND USE 2 SPRAYS IN EACH NOSTRIL DAILY 48 g 0   glucose blood (FREESTYLE LITE) test strip USE AS INSTRUCTED FOUR TIMES DAILY 150 strip 2  HYDROcodone-acetaminophen (NORCO/VICODIN) 5-325 MG tablet Take 1-2 tablets by mouth every 6 (six) hours as needed for moderate pain. 30 tablet 0   Insulin Disposable Pump (OMNIPOD DASH INTRO, GEN 4,) KIT Change every 3 days 1 kit 0   Insulin Disposable Pump (OMNIPOD DASH PODS, GEN 4,) MISC USE FOR INSULIN PUMP EVEY 3 DAYS 30 each 5   insulin lispro (HUMALOG) 100 UNIT/ML injection Max daily 100 units per pump 100 mL 3   Insulin Pen Needle 32G X 4 MM MISC Use 5 pens per day to inject insulin 200 each 3   Insulin Syringe-Needle U-100 (INSULIN SYRINGE .5CC/30GX5/16") 30G X 5/16" 0.5 ML MISC Use 3 times a day for insulin 300 each 1   losartan (COZAAR) 50 MG tablet TAKE 1 TABLET(50 MG) BY MOUTH DAILY 90 tablet 1   methocarbamol (ROBAXIN) 500 MG tablet TAKE 1 TABLET(500 MG) BY MOUTH EVERY 6 HOURS AS NEEDED FOR MUSCLE SPASMS 40 tablet 1   NON FORMULARY Take 2 capsules by mouth daily. 4LIFE TRANSFER FACTORY CARDIO     nystatin-triamcinolone ointment (MYCOLOG) Apply 1 Application topically 2 (two) times daily. 30 g 1   OVER THE COUNTER MEDICATION Take 2 tablets by mouth daily. 4LIFE TRANSFER FACTORY RECALL     No current facility-administered medications for this visit.    PHYSICAL EXAM: Vitals:   06/15/23 0945  BP: 118/64  Pulse: 80  SpO2: 97%  Weight: 202 lb 3.2 oz (91.7 kg)  Height: 5\' 4"  (1.626 m)   Body mass index is 34.71 kg/m.  Wt Readings from Last 3 Encounters:  06/15/23 202 lb 3.2 oz (91.7 kg)  06/13/23 203 lb (92.1 kg)  04/08/23 201 lb (91.2 kg)    General: Well developed, well nourished male in no apparent distress.   HEENT: AT/[place], no external lesions.  Eyes: Conjunctiva clear and no icterus. Neck: Neck supple  Lungs: Respirations not labored Neurologic: Alert, oriented, normal speech Extremities / Skin: Dry. No sores or rashes noted. No acanthosis nigricans Psychiatric: Does not appear depressed or anxious  Diabetic Foot Exam - Simple   No data filed    LABS Reviewed Lab Results  Component Value Date   HGBA1C 7.4 (A) 06/15/2023   HGBA1C 7.2 (A) 01/19/2023   HGBA1C 7.1 (A) 09/06/2022   Lab Results  Component Value Date   FRUCTOSAMINE 302 (H) 07/20/2017   Lab Results  Component Value Date   CHOL 96 08/24/2022   HDL 38.70 (L) 08/24/2022   LDLCALC 17 08/24/2022   LDLDIRECT 44.0 01/24/2020   TRIG 199.0 (H) 08/24/2022   CHOLHDL 2 08/24/2022   Lab Results  Component Value Date   MICRALBCREAT 0.7 05/24/2022   MICRALBCREAT 0.8 12/16/2020   Lab Results  Component Value Date   CREATININE 0.87 11/26/2022   Lab Results  Component Value Date   GFR 90.66 08/24/2022    ASSESSMENT / PLAN  1. Uncontrolled type 2 diabetes mellitus with hyperglycemia, with long-term current use of insulin (HCC)    Diagnoses and all orders for this visit:  Uncontrolled type 2 diabetes mellitus with hyperglycemia, with long-term current use of insulin (HCC) -     POCT glycosylated hemoglobin (Hb A1C)   Diabetes Mellitus type 2  - Diabetic status / severity: fair control  Lab Results  Component Value Date   HGBA1C 7.4 (A) 06/15/2023    - Hemoglobin A1c goal < 7-7.5% range.   Patient is advised to be stop bedtime snack.  Advised to avoid high carbohydrate meals and  portion control.  Decrease basal rate starting at 10 PM as follows to avoid hypoglycemia.  - Medications:  Insulin pump setting changed as follows: Insulin Pump setting:  Basal MN-    0.9 u/hour 2:30AM-       1.8  8AM-    1.65  Add 10 PM:  1.0 units/hr        Bolus CHO Ratio  (1unit:CHO) MN- 1:1  Correction/Sensitivity: MN- 1:25  Target: 130   Active insulin time: 4 hours  He does not carb count.  He uses fixed dose of bolus insulin via pump 10 -18 units  with meals.  Advised to stay on 10 to 16 units of bolus insulin.  Advised to take bolus insulin less by 2 units when he has increased physical activity for example walking.  - Home glucose testing: continue CGM and check blood glucose as needed.  - Discussed/ Gave Hypoglycemia treatment plan.  # Consult : not required at this time.   # Annual urine for microalbuminuria/ creatinine ratio, no microalbuminuria currently, continue losartan.  Will check urine microalbumin creatinine ratio today.  Last  Lab Results  Component Value Date   MICRALBCREAT 0.7 05/24/2022    # Foot check nightly.  # Annual dilated diabetic eye exams.   - Diet: Make healthy diabetic food choices - Life style / activity / exercise: Discussed.  Advised for walking and increase physical activity.  Blood pressure  -  BP Readings from Last 1 Encounters:  06/15/23 118/64    - Control is in target.  - No change in current plans.  Lipid status / Hyperlipidemia - Last  Lab Results  Component Value Date   LDLCALC 17 08/24/2022   - Continue atorvastatin 40 mg daily.   DISPOSITION Follow up in clinic in 3 months suggested.   All questions answered and patient verbalized understanding of the plan.  Samuel Sakari Alkhatib, MD [facility] Endocrinology [facility] [address] [place], [address] [place], [place] [address] Phone # [phone]  At least part of this note was generated using voice recognition software. Inadvertent word errors may have occurred, which were not recognized during the proofreading process.

## 2023-06-16 ENCOUNTER — Encounter: Payer: Self-pay | Admitting: Endocrinology

## 2023-06-17 ENCOUNTER — Telehealth: Payer: Self-pay

## 2023-06-17 NOTE — Telephone Encounter (Signed)
Patient's daughter called to report patient has been feeling dizzy on and off for the past few days, today is more constant. No other symptoms. Denies any pain or nausea. Per Ramon Dredge she was advised to try dramamine otc, if not better or other symptoms present, take patient to ed or urgent care. She verbalized understanding.

## 2023-06-22 ENCOUNTER — Encounter: Payer: Self-pay | Admitting: Medical

## 2023-06-22 ENCOUNTER — Encounter: Payer: Self-pay | Admitting: Endocrinology

## 2023-06-22 ENCOUNTER — Ambulatory Visit (INDEPENDENT_AMBULATORY_CARE_PROVIDER_SITE_OTHER): Payer: Medicare Other | Admitting: Medical

## 2023-06-22 VITALS — BP 128/66 | HR 77 | Resp 19 | Ht 64.0 in | Wt 197.0 lb

## 2023-06-22 DIAGNOSIS — I10 Essential (primary) hypertension: Secondary | ICD-10-CM | POA: Diagnosis not present

## 2023-06-22 DIAGNOSIS — R42 Dizziness and giddiness: Secondary | ICD-10-CM

## 2023-06-22 DIAGNOSIS — E162 Hypoglycemia, unspecified: Secondary | ICD-10-CM

## 2023-06-22 DIAGNOSIS — E119 Type 2 diabetes mellitus without complications: Secondary | ICD-10-CM | POA: Diagnosis not present

## 2023-06-22 DIAGNOSIS — Z794 Long term (current) use of insulin: Secondary | ICD-10-CM

## 2023-06-22 DIAGNOSIS — E785 Hyperlipidemia, unspecified: Secondary | ICD-10-CM | POA: Diagnosis not present

## 2023-06-22 DIAGNOSIS — H814 Vertigo of central origin: Secondary | ICD-10-CM

## 2023-06-22 NOTE — Patient Instructions (Signed)
1. Type 2 diabetes mellitus without complication, with long-term current use of insulin [facility]) Call your endocrinologist office to see if they will see you quickly. Presently stay off insuline due to low sugar events. - AMB Referral to Pharmacy Medication Management - Ambulatory referral to Endocrinology  2. Essential hypertension, benign Bp controlled on losartan  3. Hyperlipidemia, unspecified hyperlipidemia type Contnue atorvastatin  4. Dizziness Much better recently and normal neuro exam. You request ct head and since on and off dizzy for 2 weeks will order. Low sugar events may be playing role as well. - Comp Met (CMET) - CBC w/Diff - CT HEAD WO CONTRAST ( ); Future  5. Vertigo of central origin See above - CT HEAD WO CONTRAST ( ); Future  6. Hypoglycemia Stop insulin. Refer to clnical pharmcist if endocrine appt delayed. - AMB Referral to Pharmacy Medication Management - Ambulatory referral to Endocrinology   Follow up date with me to be determine after lab and imaging reviewed.

## 2023-06-22 NOTE — Progress Notes (Signed)
Subjective:    Patient ID: Samuel [name], male    DOB: 12-10-1950, 72 y.o.   MRN: [id number]  HPI  Pt in for some dizziness for one week. He has noticed mostly in morning when he wakes and at night sometimes when get up to use bathroom. At beginning he felt like had some vertigo now more light headed only.   In morning will feel transient light headed for secons. At night dizziness more profound.   Pt has new endocrinologist. Pt notes that recently his sugar are little bit lower than normal. Some glucose reading 70, 80 and 90 around midnight -2 am. Sometime this range in am fasting as well. Pt states usually his sugars were 120-130 lowest in the past.   He notes at one point  2 weeks ago his sugar level ws 50. He states hard time to get sugars back up that day. He stopped the insulin pump 10 days.  Since pt felt dizziness. He stopped the insulin. He is concerned for hypoglycemia.  No longer using insulin pump.  Pt notified endocrinologist and they gave him appointment for October.   Late Friday pt called office and advise can try dramamine otc to see if helped but he states made dizziness worse so stopped.   Not dizzy presently. No cardiac or other neurologic signs/symptoms presently.    Pt states even without insulin at times at night recently his sugars drop in 70 range.   Review of Systems  Constitutional:  Negative for chills, fatigue and fever.  Respiratory:  Negative for cough, chest tightness and shortness of breath.   Cardiovascular:  Negative for chest pain and palpitations.  Gastrointestinal:  Negative for abdominal pain, constipation and nausea.  Genitourinary:  Negative for dysuria and enuresis.  Musculoskeletal:  Negative for back pain and joint swelling.  Skin:  Negative for rash.  Neurological:  Negative for dizziness, syncope, light-headedness and headaches.  Hematological:  Negative for adenopathy. Does not bruise/bleed easily.  Psychiatric/Behavioral:   Negative for behavioral problems, decreased concentration and self-injury. The patient is not hyperactive.     Past Medical History:  Diagnosis Date   Blood transfusion without reported diagnosis    Diabetes mellitus without complication (HCC)    type II   History of ETOH abuse    Hypertension      Social History   Socioeconomic History   Marital status: Married    Spouse name: Not on file   Number of children: Not on file   Years of education: Not on file   Highest education level: 3rd grade  Occupational History   Not on file  Tobacco Use   Smoking status: Former   Smokeless tobacco: Former    Quit date: 11/08/1993  Vaping Use   Vaping status: Never Used  Substance and Sexual Activity   Alcohol use: No   Drug use: No   Sexual activity: Not on file  Other Topics Concern   Not on file  Social History Narrative   Not on file   Social Determinants of Health   Financial Resource Strain: Low Risk  (06/21/2023)   Overall Financial Resource Strain (CARDIA)    Difficulty of Paying Living Expenses: Not hard at all  Food Insecurity: No Food Insecurity (06/21/2023)   Hunger Vital Sign    Worried About Running Out of Food in the Last Year: Never true    Ran Out of Food in the Last Year: Never true  Transportation Needs: No Transportation Needs (  06/21/2023)   PRAPARE - Administrator, Civil Service (Medical): No    Lack of Transportation (Non-Medical): No  Physical Activity: Insufficiently Active (06/21/2023)   Exercise Vital Sign    Days of Exercise per Week: 2 days    Minutes of Exercise per Session: 20 min  Stress: No Stress Concern Present (06/21/2023)   [employer] of Occupational Health - Occupational Stress Questionnaire    Feeling of Stress : Not at all  Social Connections: Socially Integrated (06/21/2023)   Social Connection and Isolation Panel [NHANES]    Frequency of Communication with Friends and Family: More than three times a week    Frequency of  Social Gatherings with Friends and Family: Twice a week    Attends Religious Services: 1 to 4 times per year    Active Member of [employer] or Organizations: Yes    Attends Banker Meetings: 1 to 4 times per year    Marital Status: Married  Catering manager Violence: Unknown (02/08/2022)   Received from [employer], [facility]   HITS    Physically Hurt: Not on file    Insult or Talk Down To: Not on file    Threaten Physical Harm: Not on file    Scream or Curse: Not on file    Past Surgical History:  Procedure Laterality Date   BACK SURGERY N/A 2010   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   Procedure: LEFT TOTAL KNEE ARTHROPLASTY;  Surgeon: Kathryne Hitch, MD;  Location: [facility];  Service: Orthopedics;  Laterality: Left;    Family History  Problem Relation Age of Onset   Diabetes Unknown    Hypertension Unknown    Arthritis Father    Diabetes Father     No Known Allergies  Current Outpatient Medications on File Prior to Visit  Medication Sig Dispense Refill   aspirin 81 MG chewable tablet Chew 1 tablet (81 mg total) by mouth 2 (two) times daily. 60 tablet 0   atorvastatin (LIPITOR) 40 MG tablet TAKE 1 TABLET BY MOUTH EVERY MORNING AND 1/2 AT BEDTIME 135 tablet 1   B-D UF III MINI PEN NEEDLES 31G X 5 MM MISC USE FIVE PER DAY AS DIRECTED 200 each 0   Continuous Glucose Sensor (FREESTYLE LIBRE 2 SENSOR) MISC 2 Devices by Does not apply route every 14 (fourteen) days. 2 each 3   diclofenac (VOLTAREN) 75 MG EC tablet TAKE 1 TABLET(75 MG) BY MOUTH TWICE DAILY 60 tablet 1   Dulaglutide (TRULICITY) 4.5 MG/0.5ML SOPN Inject 4.5 mg as directed once a week. 6 mL 3   Empagliflozin-metFORMIN HCl ER (SYNJARDY XR) 03-999 MG TB24 Take 2 tablets by mouth daily. 180 tablet 3   famotidine (PEPCID) 20 MG tablet Take 1 tablet (20 mg total) by mouth daily. 30 tablet 0   famotidine (PEPCID) 20 MG tablet Take 1 tablet (20 mg total) by mouth daily. 30 tablet 0   famotidine  (PEPCID) 20 MG tablet Take 1 tablet (20 mg total) by mouth daily. 30 tablet 2   fluticasone (FLONASE) 50 MCG/ACT nasal spray SHAKE LIQUID AND USE 2 SPRAYS IN EACH NOSTRIL DAILY 48 g 0   glucose blood (FREESTYLE LITE) test strip USE AS INSTRUCTED FOUR TIMES DAILY 150 strip 2   HYDROcodone-acetaminophen (NORCO/VICODIN) 5-325 MG tablet Take 1-2 tablets by mouth every 6 (six) hours as needed for moderate pain. 30 tablet 0   Insulin Disposable Pump (OMNIPOD DASH INTRO, GEN 4,) KIT Change every 3 days 1  kit 0   Insulin Disposable Pump (OMNIPOD DASH PODS, GEN 4,) MISC USE FOR INSULIN PUMP EVEY 3 DAYS 30 each 5   insulin lispro (HUMALOG) 100 UNIT/ML injection Max daily 100 units per pump 100 mL 3   Insulin Pen Needle 32G X 4 MM MISC Use 5 pens per day to inject insulin 200 each 3   Insulin Syringe-Needle U-100 (INSULIN SYRINGE .5CC/30GX5/16") 30G X 5/16" 0.5 ML MISC Use 3 times a day for insulin 300 each 1   losartan (COZAAR) 50 MG tablet TAKE 1 TABLET(50 MG) BY MOUTH DAILY 90 tablet 1   methocarbamol (ROBAXIN) 500 MG tablet TAKE 1 TABLET(500 MG) BY MOUTH EVERY 6 HOURS AS NEEDED FOR MUSCLE SPASMS 40 tablet 1   NON FORMULARY Take 2 capsules by mouth daily. 4LIFE TRANSFER FACTORY CARDIO     nystatin-triamcinolone ointment (MYCOLOG) Apply 1 Application topically 2 (two) times daily. 30 g 1   OVER THE COUNTER MEDICATION Take 2 tablets by mouth daily. 4LIFE TRANSFER FACTORY RECALL     No current facility-administered medications on file prior to visit.    BP 128/66 (BP Location: Left Arm, Patient Position: Sitting, Cuff Size: Large)   Pulse 77   Resp 19   Ht 5\' 4"  (1.626 m)   Wt 197 lb (89.4 kg)   SpO2 96%   BMI 33.81 kg/m        Objective:   Physical Exam  General Mental Status- Alert. General Appearance- Not in acute distress.   Skin General: Color- Normal Color. Moisture- Normal Moisture.  Neck Carotid Arteries- Normal color. Moisture- Normal Moisture. No carotid bruits. No JVD.  Chest  and Lung Exam Auscultation: Breath Sounds:-Normal.  Cardiovascular Auscultation:Rythm- Regular. Murmurs & Other Heart Sounds:Auscultation of the heart reveals- No Murmurs.  Abdomen Inspection:-Inspeection Normal. Palpation/Percussion:Note:No mass. Palpation and Percussion of the abdomen reveal- Non Tender, Non Distended + BS, no rebound or guarding.  Neurologic Cranial Nerve exam:- CN III-XII intact(No nystagmus), symmetric smile. Strength:- 5/5 equal and symmetric strength both upper and lower extremities.       Assessment & Plan:   Patient Instructions  1. Type 2 diabetes mellitus without complication, with long-term current use of insulin [facility]) Call your endocrinologist office to see if they will see you quickly. Presently stay off insuline due to low sugar events. - AMB Referral to Pharmacy Medication Management - Ambulatory referral to Endocrinology  2. Essential hypertension, benign Bp controlled on losartan  3. Hyperlipidemia, unspecified hyperlipidemia type Contnue atorvastatin  4. Dizziness Much better recently and normal neuro exam. You request ct head and since on and off dizzy for 2 weeks will order. Low sugar events may be playing role as well. - Comp Met (CMET) - CBC w/Diff - CT HEAD WO CONTRAST ( ); Future  5. Vertigo of central origin See above - CT HEAD WO CONTRAST ( ); Future  6. Hypoglycemia Stop insulin. Refer to clnical pharmcist if endocrine appt delayed. - AMB Referral to Pharmacy Medication Management - Ambulatory referral to Endocrinology   Follow up date with me to be determine after lab and imaging reviewed.   Esperanza Richters, PA-C   Time spent with patient today was 42  minutes which consisted of chart revdiew, discussing diagnosis, work up treatment and documentation.

## 2023-06-23 LAB — COMPREHENSIVE METABOLIC PANEL
ALT: 27 U/L (ref 0–53)
AST: 24 U/L (ref 0–37)
Albumin: 4.6 g/dL (ref 3.5–5.2)
Alkaline Phosphatase: 50 U/L (ref 39–117)
BUN: 16 mg/dL (ref 6–23)
CO2: 29 meq/L (ref 19–32)
Calcium: 9.7 mg/dL (ref 8.4–10.5)
Chloride: 101 meq/L (ref 96–112)
Creatinine, Ser: 0.86 mg/dL (ref 0.40–1.50)
GFR: 86.83 mL/min (ref >60.00)
Glucose, Bld: 257 mg/dL — ABNORMAL HIGH (ref 70–99)
Potassium: 4.3 meq/L (ref 3.5–5.1)
Sodium: 137 meq/L (ref 135–145)
Total Bilirubin: 0.7 mg/dL (ref 0.2–1.2)
Total Protein: 7 g/dL (ref 6.0–8.3)

## 2023-06-23 LAB — CBC WITH DIFFERENTIAL/PLATELET
Basophils Absolute: 0.1 10*3/uL (ref 0.0–0.1)
Basophils Relative: 0.9 % (ref 0.0–3.0)
Eosinophils Absolute: 0.2 10*3/uL (ref 0.0–0.7)
Eosinophils Relative: 2.7 % (ref 0.0–5.0)
HCT: 46.3 % (ref 39.0–52.0)
Hemoglobin: 15.3 g/dL (ref 13.0–17.0)
Lymphocytes Relative: 32 % (ref 12.0–46.0)
Lymphs Abs: 2 10*3/uL (ref 0.7–4.0)
MCHC: 33 g/dL (ref 30.0–36.0)
MCV: 90.8 fl (ref 78.0–100.0)
Monocytes Absolute: 0.4 10*3/uL (ref 0.1–1.0)
Monocytes Relative: 5.6 % (ref 3.0–12.0)
Neutro Abs: 3.8 10*3/uL (ref 1.4–7.7)
Neutrophils Relative %: 58.8 % (ref 43.0–77.0)
Platelets: 200 10*3/uL (ref 150.0–400.0)
RBC: 5.1 Mil/uL (ref 4.22–5.81)
RDW: 14.3 % (ref 11.5–15.5)
WBC: 6.4 10*3/uL (ref 4.0–10.5)

## 2023-06-23 NOTE — Telephone Encounter (Signed)
He probably still needs insulin pump but need to adjust setting, mainly need to decrease basal rates. I had reduced basal rate starting at 10 PM at the time of visit as well. If he or daughter can change setting, I can suggest and we can advice via phone , other he may need to come to the clinic and we can make changes.   Samuel Oluwatomisin Deman, MD [facility] Endocrinology [facility] [address] [place], [address] [place], [place] [address] Phone # [phone]

## 2023-06-24 ENCOUNTER — Telehealth: Payer: Self-pay | Admitting: Endocrinology

## 2023-06-24 MED ORDER — GVOKE HYPOPEN 1-PACK 1 MG/0.2ML ~~LOC~~ SOAJ: 1.0000 mg | SUBCUTANEOUS | 2 refills | Status: AC | PRN

## 2023-06-24 NOTE — Telephone Encounter (Signed)
Patient came to the clinic with a concern of hypoglycemia, and trending down blood sugar overnight for 1 week.  He was seen in this clinic on August 7.  Recently send has changed her diet.  He has been using [employer].  Patient took off the pump 7 days ago and has not been using at all.  Has been taking Synjardy 1 tablet daily and took Trulicity last Sunday.  When he was using pump he was taking insulin 17 to 18 units with meal when blood sugar is or about 170 / 180 range.  When blood sugar is low he was not using mealtime insulin bolus.  He went without insulin pump he has noticed blood sugar is trending down overnight.  I downloaded his freestyle libre in the clinic today and reviewed.  FreeStyle Libre CGM-  Sensor Download [employer] was reviewed and summarized below.) Dates: August 3 to June 24, 2023, 14 days Sensor Average: 127  Glucose Management Indicator: 6.3% Glucose Variability: 27%  % data captured: 99  Glycemic Trends:  <54: 0% 54-70: 2% 71-180: 92% 181-250: 6% 251-400: 0%  Interpretation: -From August 8 to August 16.  He had hypoglycemia overnight especially early morning on August up to 60s.  He had 1 episode of hypoglycemia in August 9, 67 around 2 AM.  He did not have hypoglycemia in August 10, August 11, August 12, August 13, August 14 and August 16, even during overnight.  He had another episode of hypoglycemia around 2 AM 67 and 50 on August 14. He has not been using insulin pump during this time.  He took off on August 8.  However he was taking [place] and Trulicity.  During the daytime he is having acceptable blood sugar mostly in the range of 80-150.  Sometimes up to 170's.   Plan: -Overall acceptable blood sugar without insulin pump at this time. -He is worried about occasional hypoglycemia overnight.  I reassured that he is not having hypoglycemia frequently overnight.  Almost all blood sugar are in the normal range but they are in the low normal  range in 70-90 range.  Rare hypoglycemia as mentioned above. -Will hold all the antidiabetic medication for now and monitor his blood sugar. -Asked not to restart insulin pump for now. -Hold Synjardy and Trulicity. -Asked to take glucose tablets or orange juice if he develops hypoglycemia.  Prescription sent for glucagon kits sent. -Will follow-up in the next week and review his continuous glucose monitoring and make a plan. -Patient is asked to call our clinic anytime if he develop any hypoglycemia or significant hyperglycemia.  He is Spanish-speaking and English translation by his son over the phone.  [place]raq Johnathan Tortorelli, MD [facility] Endocrinology [facility] [address] [place], [address] [place], [place] [address] Phone # [phone]

## 2023-06-27 ENCOUNTER — Encounter: Payer: Self-pay | Admitting: Pharmacist

## 2023-06-27 NOTE — Progress Notes (Signed)
06/27/2023 Name: Samuel [name] MRN: [id number] DOB: [birth year]  Chief Complaint  Patient presents with   Blood Sugar Problem    Subjective:   Samuel [name] is a 72 y.o. year old male.   He was referred to the Clinical Pharmacist Practitioner by their PCP. Received message in Epic from PCP regarding elevated blood glucose last week.  Patient uses an insulin pump - OmniPod and sees Dr Erroll Luna. It looks like after CMP showed elevated blood glucose of 257, patient made appointment with Dr Erroll Luna for 08/22.  Other medications to treat diabetes: Trulicity 4.5mg  weekly and SynJardy 5/1000mg  2 tablets once a day.  I tried to reach out to patient thru Spanish Interpreter Service but was unable to reach patient today.  I did review he medications and refill history.   Several medications have low adherence based on refill history:  Atorvastatin - 90 day supply refilled 01/23/2023 and 05/23/2023 Trulicity - 28 day supply 11/12/2022 and 12/08/2022; 84 day supply 01/20/2023 and 06/10/23 SynJardy - 30 day supply 11/17/2022 and 12/29/2022; 90 day supply 01/21/2023 Losartan - 90 day supply 11/22/2022 and 05/22/2023 Omni Pod - 30 day supply 12/08/2022, 12/21/2022; 15 day supply 01/21/2023; 90 day supply 01/27/2023 and 04/29/2023 Humalog / insulin lispro - 30 day supply or 01/19/2023  Objective:  Lab Results  Component Value Date   HGBA1C 7.4 (A) 06/15/2023    Lab Results  Component Value Date   CREATININE 0.86 06/22/2023   BUN 16 06/22/2023   NA 137 06/22/2023   K 4.3 06/22/2023   CL 101 06/22/2023   CO2 29 06/22/2023    Lab Results  Component Value Date   CHOL 96 08/24/2022   HDL 38.70 (L) 08/24/2022   LDLCALC 17 08/24/2022   LDLDIRECT 44.0 01/24/2020   TRIG 199.0 (H) 08/24/2022   CHOLHDL 2 08/24/2022    Medications Reviewed Today     Reviewed by Henrene Pastor, RPH-CPP (Pharmacist) on 06/27/23 at 1146  Med List Status: <None>   Medication Order Taking? Sig Documenting  Provider Last Dose Status Informant  aspirin 81 MG chewable tablet [id number] No Chew 1 tablet (81 mg total) by mouth 2 (two) times daily. Kathryne Hitch, MD Taking Active   atorvastatin (LIPITOR) 40 MG tablet [id number] No TAKE 1 TABLET BY MOUTH EVERY MORNING AND 1/2 AT BEDTIME Esperanza Richters, PA-C Taking Active   B-D UF III MINI PEN NEEDLES 31G X 5 MM MISC [id number] No USE FIVE PER DAY AS DIRECTED Reather Littler, MD Taking Active Multiple Informants  Continuous Glucose Sensor (FREESTYLE LIBRE 2 SENSOR) MISC [id number] No 2 Devices by Does not apply route every 14 (fourteen) days. Reather Littler, MD Taking Active   diclofenac (VOLTAREN) 75 MG EC tablet [id number] No TAKE 1 TABLET(75 MG) BY MOUTH TWICE DAILY Kirtland Bouchard, PA-C Taking Active   Dulaglutide (TRULICITY) 4.5 MG/0.5ML SOPN [id number] No Inject 4.5 mg as directed once a week. Reather Littler, MD Taking Active   Empagliflozin-metFORMIN HCl ER (SYNJARDY XR) 03-999 MG TB24 [id number] No Take 2 tablets by mouth daily. Reather Littler, MD Taking Active   famotidine (PEPCID) 20 MG tablet [id number] No Take 1 tablet (20 mg total) by mouth daily. Esperanza Richters, PA-C Taking Active   Discontinued 06/27/23 1146 (Duplicate)   famotidine (PEPCID) 20 MG tablet [id number] No Take 1 tablet (20 mg total) by mouth daily. Saguier, Ramon Dredge, PA-C Taking Active   fluticasone (FLONASE) 50 MCG/ACT nasal spray [id number] No SHAKE LIQUID AND USE 2 SPRAYS IN  EACH NOSTRIL DAILY Saguier, Ramon Dredge, PA-C Taking Active   Glucagon (GVOKE HYPOPEN 1-PACK) 1 MG/0.2ML SOAJ [id number]  Inject 1 mg into the skin as needed (low blood sugar with impaired consciousness). Th[place], [place], MD  Active   glucose blood (FREESTYLE LITE) test strip [id number] No USE AS INSTRUCTED FOUR TIMES DAILY Reather Littler, MD Taking Active   HYDROcodone-acetaminophen (NORCO/VICODIN) 5-325 MG tablet [id number] No Take 1-2 tablets by mouth every 6 (six) hours as needed for moderate pain. Kirtland Bouchard, PA-C Taking  Active   Insulin Disposable Pump (OMNIPOD DASH INTRO, GEN 4,) KIT [id number] No Change every 3 days Reather Littler, MD Taking Active   Insulin Disposable Pump (OMNIPOD DASH PODS, GEN 4,) MISC [id number] No USE FOR INSULIN PUMP EVEY 3 DAYS Reather Littler, MD Taking Active   insulin lispro (HUMALOG) 100 UNIT/ML injection [id number] No Max daily 100 units per pump Reather Littler, MD Taking Active   Insulin Pen Needle 32G X 4 MM MISC [id number] No Use 5 pens per day to inject insulin Reather Littler, MD Taking Active Multiple Informants  Insulin Syringe-Needle U-100 (INSULIN SYRINGE .5CC/30GX5/16") 30G X 5/16" 0.5 ML MISC [id number] No Use 3 times a day for insulin Reather Littler, MD Taking Active   losartan (COZAAR) 50 MG tablet [id number] No TAKE 1 TABLET(50 MG) BY MOUTH DAILY Saguier, Ramon Dredge, PA-C Taking Active   methocarbamol (ROBAXIN) 500 MG tablet [id number] No TAKE 1 TABLET(500 MG) BY MOUTH EVERY 6 HOURS AS NEEDED FOR MUSCLE SPASMS Kathryne Hitch, MD Taking Active   NON FORMULARY [id number] No Take 2 capsules by mouth daily. 4LIFE TRANSFER FACTORY CARDIO [provider] Taking Active Multiple Informants  nystatin-triamcinolone ointment (MYCOLOG) [id number] No Apply 1 Application topically 2 (two) times daily. Esperanza Richters, PA-C Taking Active   OVER THE COUNTER MEDICATION [id number] No Take 2 tablets by mouth daily. 4LIFE TRANSFER FACTORY RECALL [provider] Taking Active Multiple Informants           Med Note Nolon Bussing   Tue Jul 28, 2021 11:53 AM)                Assessment/Plan:  Low adherence to several medications Unable to reach patient by phone. LM on VM with CB# [phone].  Will continue to try to reach patient to assess need for medication access assistance and to discuss med adherence.   Henrene Pastor, PharmD Clinical Pharmacist [facility]

## 2023-06-30 ENCOUNTER — Ambulatory Visit: Payer: Medicare Other | Admitting: Endocrinology

## 2023-07-05 ENCOUNTER — Telehealth: Payer: Self-pay

## 2023-07-05 ENCOUNTER — Encounter: Payer: Self-pay | Admitting: Endocrinology

## 2023-07-05 NOTE — Progress Notes (Signed)
   Care Guide Note  07/05/2023 Name: [name] MRN: [id number] DOB: July 12, 1951  Referred by: Esperanza Richters, PA-C Reason for referral : Care Coordination (Outreach to schedule with Pharm d )   Samuel [name] is a 72 y.o. year old male who is a primary care patient of Saguier, Ramon Dredge, [place]. PACEY KOVAC was referred to the pharmacist for assistance related to DM.    A second unsuccessful telephone outreach was attempted today to contact the patient who was referred to the pharmacy team for assistance with medication management. Additional attempts will be made to contact the patient.  Penne Lash, RMA Care Guide [facility]  [place], [place] [address] Direct Dial: [phone] [email or web address]

## 2023-07-07 ENCOUNTER — Encounter: Payer: Self-pay | Admitting: Endocrinology

## 2023-07-07 ENCOUNTER — Ambulatory Visit: Payer: Medicare Other | Admitting: Endocrinology

## 2023-07-07 VITALS — BP 115/50 | HR 72 | Ht 64.0 in | Wt 200.8 lb

## 2023-07-07 DIAGNOSIS — E1165 Type 2 diabetes mellitus with hyperglycemia: Secondary | ICD-10-CM | POA: Diagnosis not present

## 2023-07-07 DIAGNOSIS — Z794 Long term (current) use of insulin: Secondary | ICD-10-CM

## 2023-07-07 DIAGNOSIS — Z9641 Presence of insulin pump (external) (internal): Secondary | ICD-10-CM

## 2023-07-07 NOTE — Progress Notes (Signed)
Outpatient Endocrinology Note [place] Eimy Plaza, MD  07/07/23  Patient Name: Samuel [name]   DOB : 1951-10-09 MRN # [id number]                                                    REASON OF VISIT: Follow up for type 2 diabetes mellitus  PCP: Esperanza Richters, PA-C  HISTORY OF PRESENT ILLNESS:   AAKARSH CUTTER is a 72 y.o. old male with past medical history listed below, is here for follow up for type 2 diabetes mellitus.   Pertinent Diabetes History:  _Diagnosed as type 2 in 1990.  Patient was initially treated with metformin and later insulin added.  He was mostly on insulin 70/30 regimen in the past.  At some point he was also on basal bolus insulin with Toujeo and Humalog.    OmniPod Dash insulin pump started in 2018/2019.  Chronic Diabetes Complications : Retinopathy: no. Last ophthalmology exam was done on annually, reportedly. Nephropathy: no, on losartan Peripheral neuropathy: no Coronary artery disease: no Stroke: no  Relevant comorbidities and cardiovascular risk factors: Obesity: yes Body mass index is 34.47 kg/m.  Hypertension: Yes Hyperlipidemia.  Yes, on statin.  Current / Home Diabetic regimen includes: Omnipod 4 / Dash /not with sensor.  He uses Humalog. Synjardy 03-999 mg 2 tab daily.  Trulicity 4.5mg  weekly.  Insulin Pump setting:  Basal MN-    0.9 u/hour 2:30AM- 1.8  8AM-    1.65   10 PM- 1.0       Bolus CHO Ratio (1unit:CHO) MN- 1:1 ( using fixed manual bolus 8-18 units)  Correction/Sensitivity: MN- 1:25  Target: 130   Active insulin time: 4 hours  Prior diabetic medications: Insulin 70/30, Toujeo  INSULIN PUMP DATA:                         Average total daily insulin:   units, Basal: %, Bolus: %  He does not use sensor with OmniPod.  He does not do carb counting.  He uses fixed dose bolus from 10 to 18 units depending upon the blood sugar and food size with meals.   Glycemic data:    CONTINUOUS GLUCOSE MONITORING SYSTEM (CGMS)  INTERPRETATION:  FreeStyle Libre CGM-  Sensor Download (Sensor download was reviewed and summarized below.) Dates: August 16 to July 07, 2023, 14 days. Sensor Average: 152  Glucose Management Indicator: 6.9% Glucose Variability: 29.5%  % data captured: 97%  Glycemic Trends:  <54: 0% 54-70: 0% 71-180: 79% 181-250: 17% 251-400: 4%  Interpretation: Most of the blood sugar acceptable.  Occasional hyperglycemia sometimes up to 300 treated with meals postprandially.  Rare hypoglycemia happened twice in last 2 weeks on August 26 around noon time blood sugar 52 and on August 28 blood sugar 66 around noon time.  No other hypoglycemia.  Overnight blood sugar mostly acceptable sometime in the lower normal range up to 90s.  Hypoglycemia: Patient has minor hypoglycemic episodes. Patient has hypoglycemia awareness.    Factors modifying glucose control: 1.  Diabetic diet assessment: Almost always eats a bedtime snack.  Supper around 6 PM.  2.  Staying active or exercising: Started to walk.  3.  Medication compliance: compliant all of the time.  Interval history 07/07/23 Two weeks ago patient had concern of hypoglycemia especially overnight  and early morning, stopped using insulin pump at that time.  After few days he did not have hypoglycemia and restarted on insulin pump including Synjardy and Trulicity.  He admitted as reviewed above.  He is not having significant hypoglycemia.  Blood sugar overall acceptable with occasional postprandial hyperglycemia.  Patient is accompanied by his daughter in the clinic today.  In person a Spanish interpreter was used in the clinic today.  He has been manual bolusing for the meals.  He does not do carb counting.  He has been bolusing from 9 to 18 units based on the meal size and blood sugar at that time.  REVIEW OF SYSTEMS As per history of present illness.   PAST MEDICAL HISTORY: Past Medical History:  Diagnosis Date   Blood transfusion without  reported diagnosis    Diabetes mellitus without complication (HCC)    type II   History of ETOH abuse    Hypertension     PAST SURGICAL HISTORY: Past Surgical History:  Procedure Laterality Date   BACK SURGERY N/A 2010   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   Procedure: LEFT TOTAL KNEE ARTHROPLASTY;  Surgeon: Kathryne Hitch, MD;  Location: [facility];  Service: Orthopedics;  Laterality: Left;    ALLERGIES: No Known Allergies  FAMILY HISTORY:  Family History  Problem Relation Age of Onset   Diabetes Unknown    Hypertension Unknown    Arthritis Father    Diabetes Father     SOCIAL HISTORY: Social History   Socioeconomic History   Marital status: Married    Spouse name: Not on file   Number of children: Not on file   Years of education: Not on file   Highest education level: 3rd grade  Occupational History   Not on file  Tobacco Use   Smoking status: Former   Smokeless tobacco: Former    Quit date: 11/08/1993  Vaping Use   Vaping status: Never Used  Substance and Sexual Activity   Alcohol use: No   Drug use: No   Sexual activity: Not on file  Other Topics Concern   Not on file  Social History Narrative   Not on file   Social Determinants of Health   Financial Resource Strain: Low Risk  (06/21/2023)   Overall Financial Resource Strain (CARDIA)    Difficulty of Paying Living Expenses: Not hard at all  Food Insecurity: No Food Insecurity (06/21/2023)   Hunger Vital Sign    Worried About Running Out of Food in the Last Year: Never true    Ran Out of Food in the Last Year: Never true  Transportation Needs: No Transportation Needs (06/21/2023)   PRAPARE - Administrator, Civil Service (Medical): No    Lack of Transportation (Non-Medical): No  Physical Activity: Insufficiently Active (06/21/2023)   Exercise Vital Sign    Days of Exercise per Week: 2 days    Minutes of Exercise per Session: 20 min  Stress: No Stress Concern Present (06/21/2023)    [employer] of Occupational Health - Occupational Stress Questionnaire    Feeling of Stress : Not at all  Social Connections: Socially Integrated (06/21/2023)   Social Connection and Isolation Panel [NHANES]    Frequency of Communication with Friends and Family: More than three times a week    Frequency of Social Gatherings with Friends and Family: Twice a week    Attends Religious Services: 1 to 4 times per year    Active Member of Clubs or  Organizations: Yes    Attends Banker Meetings: 1 to 4 times per year    Marital Status: Married    MEDICATIONS:  Current Outpatient Medications  Medication Sig Dispense Refill   aspirin 81 MG chewable tablet Chew 1 tablet (81 mg total) by mouth 2 (two) times daily. 60 tablet 0   atorvastatin (LIPITOR) 40 MG tablet TAKE 1 TABLET BY MOUTH EVERY MORNING AND 1/2 AT BEDTIME 135 tablet 1   B-D UF III MINI PEN NEEDLES 31G X 5 MM MISC USE FIVE PER DAY AS DIRECTED 200 each 0   Continuous Glucose Sensor (FREESTYLE LIBRE 2 SENSOR) MISC 2 Devices by Does not apply route every 14 (fourteen) days. 2 each 3   Dulaglutide (TRULICITY) 4.5 MG/0.5ML SOPN Inject 4.5 mg as directed once a week. 6 mL 3   Empagliflozin-metFORMIN HCl ER (SYNJARDY XR) 03-999 MG TB24 Take 2 tablets by mouth daily. 180 tablet 3   famotidine (PEPCID) 20 MG tablet Take 1 tablet (20 mg total) by mouth daily. 30 tablet 0   Glucagon (GVOKE HYPOPEN 1-PACK) 1 MG/0.2ML SOAJ Inject 1 mg into the skin as needed (low blood sugar with impaired consciousness). 0.4 mL 2   glucose blood (FREESTYLE LITE) test strip USE AS INSTRUCTED FOUR TIMES DAILY 150 strip 2   Insulin Disposable Pump (OMNIPOD DASH INTRO, GEN 4,) KIT Change every 3 days 1 kit 0   Insulin Disposable Pump (OMNIPOD DASH PODS, GEN 4,) MISC USE FOR INSULIN PUMP EVEY 3 DAYS 30 each 5   insulin lispro (HUMALOG) 100 UNIT/ML injection Max daily 100 units per pump 100 mL 3   Insulin Pen Needle 32G X 4 MM MISC Use 5 pens per day to  inject insulin 200 each 3   Insulin Syringe-Needle U-100 (INSULIN SYRINGE .5CC/30GX5/16") 30G X 5/16" 0.5 ML MISC Use 3 times a day for insulin 300 each 1   losartan (COZAAR) 50 MG tablet TAKE 1 TABLET(50 MG) BY MOUTH DAILY 90 tablet 1   NON FORMULARY Take 2 capsules by mouth daily. 4LIFE TRANSFER FACTORY CARDIO     nystatin-triamcinolone ointment (MYCOLOG) Apply 1 Application topically 2 (two) times daily. 30 g 1   OVER THE COUNTER MEDICATION Take 2 tablets by mouth daily. 4LIFE TRANSFER FACTORY RECALL     diclofenac (VOLTAREN) 75 MG EC tablet TAKE 1 TABLET(75 MG) BY MOUTH TWICE DAILY (Patient not taking: Reported on 07/07/2023) 60 tablet 1   fluticasone (FLONASE) 50 MCG/ACT nasal spray SHAKE LIQUID AND USE 2 SPRAYS IN EACH NOSTRIL DAILY (Patient not taking: Reported on 07/07/2023) 48 g 0   HYDROcodone-acetaminophen (NORCO/VICODIN) 5-325 MG tablet Take 1-2 tablets by mouth every 6 (six) hours as needed for moderate pain. (Patient not taking: Reported on 07/07/2023) 30 tablet 0   methocarbamol (ROBAXIN) 500 MG tablet TAKE 1 TABLET(500 MG) BY MOUTH EVERY 6 HOURS AS NEEDED FOR MUSCLE SPASMS (Patient not taking: Reported on 07/07/2023) 40 tablet 1   No current facility-administered medications for this visit.    PHYSICAL EXAM: Vitals:   07/07/23 1338  BP: (!) 115/50  Pulse: 72  SpO2: 99%  Weight: 200 lb 12.8 oz (91.1 kg)  Height: 5\' 4"  (1.626 m)   Body mass index is 34.47 kg/m.  Wt Readings from Last 3 Encounters:  07/07/23 200 lb 12.8 oz (91.1 kg)  06/22/23 197 lb (89.4 kg)  06/15/23 202 lb 3.2 oz (91.7 kg)    General: Well developed, well nourished male in no apparent distress.  HEENT: AT/[place],  no external lesions.  Eyes: Conjunctiva clear and no icterus. Neck: Neck supple  Lungs: Respirations not labored Neurologic: Alert, oriented, normal speech Extremities / Skin: Dry. No sores or rashes noted. No acanthosis nigricans Psychiatric: Does not appear depressed or anxious  Diabetic Foot  Exam - Simple   No data filed    LABS Reviewed Lab Results  Component Value Date   HGBA1C 7.4 (A) 06/15/2023   HGBA1C 7.2 (A) 01/19/2023   HGBA1C 7.1 (A) 09/06/2022   Lab Results  Component Value Date   FRUCTOSAMINE 302 (H) 07/20/2017   Lab Results  Component Value Date   CHOL 96 08/24/2022   HDL 38.70 (L) 08/24/2022   LDLCALC 17 08/24/2022   LDLDIRECT 44.0 01/24/2020   TRIG 199.0 (H) 08/24/2022   CHOLHDL 2 08/24/2022   Lab Results  Component Value Date   MICRALBCREAT 1.5 06/15/2023   MICRALBCREAT 0.7 05/24/2022   Lab Results  Component Value Date   CREATININE 0.86 06/22/2023   Lab Results  Component Value Date   GFR 86.83 06/22/2023    ASSESSMENT / PLAN  1. Uncontrolled type 2 diabetes mellitus with hyperglycemia, with long-term current use of insulin (HCC)   2. Insulin pump status     Diagnoses and all orders for this visit:  Uncontrolled type 2 diabetes mellitus with hyperglycemia, with long-term current use of insulin (HCC)  Insulin pump status    Diabetes Mellitus type 2  - Diabetic status / severity: fair control  Lab Results  Component Value Date   HGBA1C 7.4 (A) 06/15/2023    - Hemoglobin A1c goal < 7-7.5% range.   Discussed about adjusting fixed bolus of insulin for his meals he is doing 8 to 18 units.  In the early morning he has been having trending down blood sugar up to 90s.  Adjusted the basal rate as follows.  - Medications:  Insulin pump setting changed as follows: Insulin Pump setting:  Basal MN-    0.9 u/hour 2:30AM-       1.8 , changed to 1.2. 8AM-    1.65  Add 10 PM:  1.0 units/hr        Bolus CHO Ratio (1unit:CHO) MN- 1:1  Correction/Sensitivity: MN- 1:25  Target: 130   Active insulin time: 4 hours  -Continue Synjardy 03-999 mg 2 tab daily.  -Continue Trulicity 4.5mg  weekly.  He does not carb count.  He uses fixed dose of bolus insulin via pump 10 -18 units  with meals.  Advised to stay on 6 to 16 units  of bolus insulin, based on meal size and as per his experience.  Advised to take bolus insulin less by 2 units when he has increased physical activity for example walking.  - Home glucose testing: continue CGM and check blood glucose as needed.  - Discussed/ Gave Hypoglycemia treatment plan.  # Consult : not required at this time.   # Annual urine for microalbuminuria/ creatinine ratio, no microalbuminuria currently, continue losartan.  Last  Lab Results  Component Value Date   MICRALBCREAT 1.5 06/15/2023    # Foot check nightly.  # Annual dilated diabetic eye exams.   - Diet: Make healthy diabetic food choices - Life style / activity / exercise: Discussed.  Advised for walking and increase physical activity.  Blood pressure  -  BP Readings from Last 1 Encounters:  07/07/23 (!) 115/50    - Control is in target.  - No change in current plans.  Lipid status / Hyperlipidemia -  Last  Lab Results  Component Value Date   LDLCALC 17 08/24/2022   - Continue atorvastatin 40 mg daily.   DISPOSITION Follow up in clinic in 3 months suggested.   All questions answered and patient verbalized understanding of the plan.  [place]ra[place] Marrion Accomando, MD [facility] Endocrinology [facility] [address] [place], [address] [place], [place] [address] Phone # [phone]  At least part of this note was generated using voice recognition software. Inadvertent word errors may have occurred, which were not recognized during the proofreading process.

## 2023-07-15 NOTE — Progress Notes (Signed)
   Care Guide Note  07/15/2023 Name: ARDARIUS HAASCH MRN: [id number] DOB: 1951-09-20  Referred by: Esperanza Richters, PA-C Reason for referral : Care Coordination (Outreach to schedule with Pharm d )   TIERNEY HEMMERLING is a 72 y.o. year old male who is a primary care patient of [place], Ramon Dred[place], [place]. SONNI BISCOE was referred to the pharmacist for assistance related to DM.    A third unsuccessful telephone outreach was attempted today to contact the patient who was referred to the pharmacy team for assistance with medication management. The Population Health team is pleased to engage with this patient at any time in the future upon receipt of referral and should he/she be interested in assistance from the [facility] team.   Penne Lash, RMA Care Guide [facility]  [place], [place] [address] Direct Dial: [phone] [email or web address]

## 2023-07-22 ENCOUNTER — Other Ambulatory Visit: Payer: Self-pay

## 2023-07-25 ENCOUNTER — Telehealth (HOSPITAL_BASED_OUTPATIENT_CLINIC_OR_DEPARTMENT_OTHER): Payer: Self-pay

## 2023-07-27 NOTE — Progress Notes (Signed)
Surgical Instructions   Your procedure is scheduled on Aug 09, 2023. Report to [facility] Main Entrance "A" at 7:55 A.M., then check in with the Admitting office. Any questions or running late day of surgery: call [phone]  Questions prior to your surgery date: call [phone], Monday-Friday, 8am-4pm. If you experience any cold or flu symptoms such as cough, fever, chills, shortness of breath, etc. between now and your scheduled surgery, please notify us at the above number.     Remember:  Do not eat after midnight the night before your surgery  You may drink clear liquids until 6:55 a.m. the morning of your surgery.   Clear liquids allowed are: Water, Non-Citrus Juices (without pulp), Carbonated Beverages, Clear Tea, Black Coffee Only (NO MILK, CREAM OR POWDERED CREAMER of any kind), and Gatorade. Patient Instructions  The night before surgery:  No food after midnight. ONLY clear liquids after midnight  The day of surgery (if you do NOT have diabetes):  Drink ONE (1) Pre-Surgery Clear Ensure by 6:55 a.m. the morning of surgery. Drink in one sitting. Do not sip.  This drink was given to you during your hospital  pre-op appointment visit.  Nothing else to drink after completing the  Pre-Surgery Clear Ensure.  The day of surgery (if you have diabetes): Drink ONE (1) 12 oz G2 given to you in your pre admission testing appointment by  the morning of surgery. Drink in one sitting. Do not sip.  This drink was given to you during your hospital  pre-op appointment visit.  Nothing else to drink after completing the  12 oz bottle of G2.         If you have questions, please contact your surgeon's office.     Take these medicines the morning of surgery with A SIP OF WATER  atorvastatin (LIPITOR)    May take these medicines IF NEEDED:       Glucagon   One week prior to surgery, STOP taking any Aspirin (unless otherwise instructed by your surgeon) Aleve, Naproxen, Ibuprofen,  Motrin, Advil, Goody's, BC's, all herbal medications, fish oil, and non-prescription vitamins.   Contact surgeron office for instructions regarding aspirin 81.      WHAT DO I DO ABOUT MY DIABETES MEDICATION?   Do not take oral diabetes medicines (pills) the morning of surgery. Empagliflozin-metFORMIN HCl ER (SYNJARDY XR) LAST DOSE 08-05-23       insulin lispro (HUMALOG) Follow endocrinologist recommendations         The day of surgery, do not take other diabetes injectables, including Byetta (exenatide), Bydureon (exenatide ER), Victoza (liraglutide), or Trulicity (dulaglutide). Dulaglutide (TRULICITY)  LAST DOSE 07-21-23 If your CBG is greater than 220 mg/dL, you may take  of your sliding scale (correction) dose of insulin.   HOW TO MANAGE YOUR DIABETES BEFORE AND AFTER SURGERY  Why is it important to control my blood sugar before and after surgery? Improving blood sugar levels before and after surgery helps healing and can limit problems. A way of improving blood sugar control is eating a healthy diet by:  Eating less sugar and carbohydrates  Increasing activity/exercise  Talking with your doctor about reaching your blood sugar goals High blood sugars (greater than 180 mg/dL) can raise your risk of infections and slow your recovery, so you will need to focus on controlling your diabetes during the weeks before surgery. Make sure that the doctor who takes care of your diabetes knows about your planned surgery including the date and location.  How do I manage my blood sugar before surgery? Check your blood sugar at least 4 times a day, starting 2 days before surgery, to make sure that the level is not too high or low.  Check your blood sugar the morning of your surgery when you wake up and every 2 hours until you get to the Short Stay unit.  If your blood sugar is less than 70 mg/dL, you will need to treat for low blood sugar: Do not take insulin. Treat a low blood sugar (less  than 70 mg/dL) with  cup of clear juice (cranberry or apple), 4 glucose tablets, OR glucose gel. Recheck blood sugar in 15 minutes after treatment (to make sure it is greater than 70 mg/dL). If your blood sugar is not greater than 70 mg/dL on recheck, call [phone] for further instructions. Report your blood sugar to the short stay nurse when you get to Short Stay.  If you are admitted to the hospital after surgery: Your blood sugar will be checked by the staff and you will probably be given insulin after surgery (instead of oral diabetes medicines) to make sure you have good blood sugar levels. The goal for blood sugar control after surgery is 80-180 mg/dL.                      Do NOT Smoke (Tobacco/Vaping) for 24 hours prior to your procedure.  If you use a CPAP at night, you may bring your mask/headgear for your overnight stay.   You will be asked to remove any contacts, glasses, piercing's, hearing aid's, dentures/partials prior to surgery. Please bring cases for these items if needed.    Patients discharged the day of surgery will not be allowed to drive home, and someone needs to stay with them for 24 hours.  SURGICAL WAITING ROOM VISITATION Patients may have no more than 2 support people in the waiting area - these visitors may rotate.   Pre-op nurse will coordinate an appropriate time for 1 ADULT support person, who may not rotate, to accompany patient in pre-op.  Children under the age of 25 must have an adult with them who is not the patient and must remain in the main waiting area with an adult.  If the patient needs to stay at the hospital during part of their recovery, the visitor guidelines for inpatient rooms apply.  Please refer to the [facility] website for the visitor guidelines for any additional information.   If you received a COVID test during your pre-op visit  it is requested that you wear a mask when out in public, stay away from anyone that may not be  feeling well and notify your surgeon if you develop symptoms. If you have been in contact with anyone that has tested positive in the last 10 days please notify you surgeon.      Pre-operative 5 CHG Bathing Instructions   You can play a key role in reducing the risk of infection after surgery. Your skin needs to be as free of germs as possible. You can reduce the number of germs on your skin by washing with CHG (chlorhexidine gluconate) soap before surgery. CHG is an antiseptic soap that kills germs and continues to kill germs even after washing.   DO NOT use if you have an allergy to chlorhexidine/CHG or antibacterial soaps. If your skin becomes reddened or irritated, stop using the CHG and notify one of our RNs at [phone].   Please  shower with the CHG soap starting 4 days before surgery using the following schedule:     Please keep in mind the following:  DO NOT shave, including legs and underarms, starting the day of your first shower.   You may shave your face at any point before/day of surgery.  Place clean sheets on your bed the day you start using CHG soap. Use a clean washcloth (not used since being washed) for each shower. DO NOT sleep with pets once you start using the CHG.   CHG Shower Instructions:  Wash your face and private area with normal soap. If you choose to wash your hair, wash first with your normal shampoo.  After you use shampoo/soap, rinse your hair and body thoroughly to remove shampoo/soap residue.  Turn the water OFF and apply about 3 tablespoons (45 ml) of CHG soap to a CLEAN washcloth.  Apply CHG soap ONLY FROM YOUR NECK DOWN TO YOUR TOES (washing for 3-5 minutes)  DO NOT use CHG soap on face, private areas, open wounds, or sores.  Pay special attention to the area where your surgery is being performed.  If you are having back surgery, having someone wash your back for you may be helpful. Wait 2 minutes after CHG soap is applied, then you may rinse off  the CHG soap.  Pat dry with a clean towel  Put on clean clothes/pajamas   If you choose to wear lotion, please use ONLY the CHG-compatible lotions on the back of this paper.   Additional instructions for the day of surgery: DO NOT APPLY any lotions, deodorants, cologne, or perfumes.   Do not bring valuables to the hospital. [facility] is not responsible for any belongings/valuables. Do not wear nail polish, gel polish, artificial nails, or any other type of covering on natural nails (fingers and toes) Do not wear jewelry or makeup Put on clean/comfortable clothes.  Please brush your teeth.  Ask your nurse before applying any prescription medications to the skin.     CHG Compatible Lotions   Aveeno Moisturizing lotion  Cetaphil Moisturizing Cream  Cetaphil Moisturizing Lotion  Clairol Herbal Essence Moisturizing Lotion, Dry Skin  Clairol Herbal Essence Moisturizing Lotion, Extra Dry Skin  Clairol Herbal Essence Moisturizing Lotion, Normal Skin  Curel Age Defying Therapeutic Moisturizing Lotion with Alpha Hydroxy  Curel Extreme Care Body Lotion  Curel Soothing Hands Moisturizing Hand Lotion  Curel Therapeutic Moisturizing Cream, Fragrance-Free  Curel Therapeutic Moisturizing Lotion, Fragrance-Free  Curel Therapeutic Moisturizing Lotion, Original Formula  Eucerin Daily Replenishing Lotion  Eucerin Dry Skin Therapy Plus Alpha Hydroxy Crme  Eucerin Dry Skin Therapy Plus Alpha Hydroxy Lotion  Eucerin Original Crme  Eucerin Original Lotion  Eucerin Plus Crme Eucerin Plus Lotion  Eucerin TriLipid Replenishing Lotion  Keri Anti-Bacterial Hand Lotion  Keri Deep Conditioning Original Lotion Dry Skin Formula Softly Scented  Keri Deep Conditioning Original Lotion, Fragrance Free Sensitive Skin Formula  Keri Lotion Fast Absorbing Fragrance Free Sensitive Skin Formula  Keri Lotion Fast Absorbing Softly Scented Dry Skin Formula  Keri Original Lotion  Keri Skin Renewal Lotion Keri  Silky Smooth Lotion  Keri Silky Smooth Sensitive Skin Lotion  Nivea Body Creamy Conditioning Oil  Nivea Body Extra Enriched Teacher, adult education Moisturizing Lotion Nivea Crme  Nivea Skin Firming Lotion  NutraDerm 30 Skin Lotion  NutraDerm Skin Lotion  NutraDerm Therapeutic Skin Cream  NutraDerm Therapeutic Skin Lotion  ProShield Protective Hand Cream  Provon moisturizing lotion  Please read over the following fact sheets that you were given.

## 2023-07-28 ENCOUNTER — Encounter (HOSPITAL_COMMUNITY): Payer: Self-pay

## 2023-07-28 ENCOUNTER — Encounter (HOSPITAL_COMMUNITY)
Admission: RE | Admit: 2023-07-28 | Discharge: 2023-07-28 | Disposition: A | Discharge status: Home / Self Care | Payer: Medicare Other | Source: Ambulatory Visit | Attending: Orthopaedic Surgery | Admitting: Orthopaedic Surgery

## 2023-07-28 ENCOUNTER — Other Ambulatory Visit: Payer: Self-pay

## 2023-07-28 VITALS — BP 130/68 | HR 88 | Temp 98.4°F | Resp 18 | Ht 64.0 in | Wt 198.4 lb

## 2023-07-28 DIAGNOSIS — E119 Type 2 diabetes mellitus without complications: Secondary | ICD-10-CM | POA: Diagnosis not present

## 2023-07-28 DIAGNOSIS — Z01818 Encounter for other preprocedural examination: Secondary | ICD-10-CM

## 2023-07-28 DIAGNOSIS — M1711 Unilateral primary osteoarthritis, right knee: Secondary | ICD-10-CM | POA: Diagnosis not present

## 2023-07-28 DIAGNOSIS — Z01812 Encounter for preprocedural laboratory examination: Secondary | ICD-10-CM | POA: Insufficient documentation

## 2023-07-28 HISTORY — DX: Unspecified osteoarthritis, unspecified site: M19.90

## 2023-07-28 LAB — COMPREHENSIVE METABOLIC PANEL
ALT: 30 U/L (ref 0–44)
AST: 27 U/L (ref 15–41)
Albumin: 4 g/dL (ref 3.5–5.0)
Alkaline Phosphatase: 52 U/L (ref 38–126)
Anion gap: 11 (ref 5–15)
BUN: 8 mg/dL (ref 8–23)
CO2: 29 mmol/L (ref 22–32)
Calcium: 10.2 mg/dL (ref 8.9–10.3)
Chloride: 98 mmol/L (ref 98–111)
Creatinine, Ser: 0.82 mg/dL (ref 0.61–1.24)
GFR, Estimated: >60 mL/min (ref >60)
Glucose, Bld: 148 mg/dL — ABNORMAL HIGH (ref 70–99)
Potassium: 4 mmol/L (ref 3.5–5.1)
Sodium: 138 mmol/L (ref 135–145)
Total Bilirubin: 0.9 mg/dL (ref 0.3–1.2)
Total Protein: 6.9 g/dL (ref 6.5–8.1)

## 2023-07-28 LAB — CBC
HCT: 46.3 % (ref 39.0–52.0)
Hemoglobin: 15.3 g/dL (ref 13.0–17.0)
MCH: 29.7 pg (ref 26.0–34.0)
MCHC: 33 g/dL (ref 30.0–36.0)
MCV: 89.7 fL (ref 80.0–100.0)
Platelets: 195 10*3/uL (ref 150–400)
RBC: 5.16 MIL/uL (ref 4.22–5.81)
RDW: 13.3 % (ref 11.5–15.5)
WBC: 6.2 10*3/uL (ref 4.0–10.5)
nRBC: 0 % (ref 0.0–0.2)

## 2023-07-28 LAB — HEMOGLOBIN A1C
Hgb A1c MFr Bld: 7.8 % — ABNORMAL HIGH (ref 4.8–5.6)
Mean Plasma Glucose: 177.16 mg/dL

## 2023-07-28 LAB — SURGICAL PCR SCREEN
MRSA, PCR: NEGATIVE
Staphylococcus aureus: NEGATIVE

## 2023-07-28 LAB — GLUCOSE, CAPILLARY: Glucose-Capillary: 169 mg/dL — ABNORMAL HIGH (ref 70–99)

## 2023-07-28 LAB — NO BLOOD PRODUCTS

## 2023-07-28 NOTE — Progress Notes (Addendum)
PCP - Esperanza Richters Endocrinologist - [place] Th[place]  PPM/ICD - denies Device Orders - n/a Rep Notified - n/a  Chest x-ray -  EKG - 11-26-22 Stress Test -  ECHO -  Cardiac Cath -   Sleep Study - denies   Fasting Blood Sugar - between 130-190 depending on what he eats Checks Blood Sugar has Omipod left arm Insulin pump right arm INSTRUCTED PATIENT TO CALL ENDOCRINOLOGIST FOR INSTRUCTIONS ON PUMP Diabetes coordinator made aware Last dose of GLP1 agonist-  Trulicity Last dose 07-21-23 (SYNJARDY XR) last dose 08-05-23  Blood Thinner Instructions:Denies Aspirin Instructions: Instructed patient to follow up with surgeon  ERAS Protcol - with drink PRE-SURGERY  G2-   COVID TEST- n/a   Anesthesia review: yes  Patient denies shortness of breath, fever, cough and chest pain at PAT appointment   All instructions explained to the patient, with a verbal understanding of the material. Patient agrees to go over the instructions while at home for a better understanding. Patient also instructed to self quarantine after being tested for COVID-19. The opportunity to ask questions was provided.

## 2023-08-04 DIAGNOSIS — E1165 Type 2 diabetes mellitus with hyperglycemia: Secondary | ICD-10-CM | POA: Diagnosis not present

## 2023-08-09 ENCOUNTER — Ambulatory Visit (HOSPITAL_COMMUNITY): Admission: RE | Admit: 2023-08-09 | Payer: Medicare Other | Source: Home / Self Care | Admitting: Orthopaedic Surgery

## 2023-08-09 ENCOUNTER — Encounter (HOSPITAL_COMMUNITY): Admission: RE | Payer: Self-pay | Source: Home / Self Care

## 2023-08-09 DIAGNOSIS — M1711 Unilateral primary osteoarthritis, right knee: Secondary | ICD-10-CM

## 2023-08-09 SURGERY — ARTHROPLASTY, KNEE, TOTAL
Anesthesia: Spinal | Site: Knee | Laterality: Right

## 2023-08-12 ENCOUNTER — Encounter: Payer: Self-pay | Admitting: Endocrinology

## 2023-08-12 ENCOUNTER — Ambulatory Visit: Payer: Medicare Other | Admitting: Endocrinology

## 2023-08-12 VITALS — BP 120/60 | HR 75 | Ht 64.0 in | Wt 195.2 lb

## 2023-08-12 DIAGNOSIS — E119 Type 2 diabetes mellitus without complications: Secondary | ICD-10-CM | POA: Diagnosis not present

## 2023-08-12 DIAGNOSIS — Z794 Long term (current) use of insulin: Secondary | ICD-10-CM

## 2023-08-12 DIAGNOSIS — Z9641 Presence of insulin pump (external) (internal): Secondary | ICD-10-CM | POA: Diagnosis not present

## 2023-08-12 NOTE — Progress Notes (Signed)
Outpatient Endocrinology Note Samuel Shonna Deiter, MD  08/12/23  Patient Name: Samuel [name]   DOB : 09-28-1951 MRN # [id number]                                                    REASON OF VISIT: Follow up for type 2 diabetes mellitus  PCP: Esperanza Richters, PA-C  HISTORY OF PRESENT ILLNESS:   Samuel [name] is a 72 y.o. old male with past medical history listed below, is here for follow up for type 2 diabetes mellitus.   Pertinent Diabetes History:  _Diagnosed as type 2 in 1990.  Patient was initially treated with metformin and later insulin added.  He was mostly on insulin 70/30 regimen in the past.  At some point he was also on basal bolus insulin with Toujeo and Humalog.    OmniPod Dash insulin pump started in 2018/2019.  Chronic Diabetes Complications : Retinopathy: no. Last ophthalmology exam was done on annually, reportedly. Nephropathy: no, on losartan Peripheral neuropathy: no Coronary artery disease: no Stroke: no  Relevant comorbidities and cardiovascular risk factors: Obesity: yes Body mass index is 33.51 kg/m.  Hypertension: Yes Hyperlipidemia.  Yes, on statin.  Current / Home Diabetic regimen includes: Omnipod 4 / Dash /not with sensor.  He uses Humalog U100. Synjardy 03-999 mg 2 tab daily.  Trulicity 4.5mg  weekly.  Insulin Pump setting:  Basal ( total 33.95) MN-    0.9 u/hour 2:30AM- 1.2  8AM-    1.65   10 PM- 1.0       Bolus CHO Ratio (1unit:CHO) MN- 1:1 ( using fixed manual bolus 8-18 units)  Correction/Sensitivity: MN- 1:25  Target: 130   Active insulin time: 4 hours  Prior diabetic medications: Insulin 70/30, Toujeo  INSULIN PUMP DATA:                         Average total daily insulin: 62.2  units, Basal: 53%%, Bolus: 47 %  He does not use sensor with OmniPod.  He does not do carb counting.  He uses fixed dose bolus from 8 to 18 units depending upon the blood sugar and food size with meals, 2-3 times a day.   Glycemic data:     CONTINUOUS GLUCOSE MONITORING SYSTEM (CGMS) INTERPRETATION:  FreeStyle Libre CGM-  Sensor Download (Sensor download was reviewed and summarized below.) Dates: September 21 to August 13, 2023, 14 days. Sensor Average: 130  Glucose Management Indicator: 6.4% Glucose Variability: 29.7%  % data captured: 96%  Glycemic Trends:  <54: 0% 54-70: 3% 71-180: 86% 181-250: 11% 251-400: 0%  Interpretation: Most of the blood sugar are acceptable.  He is having occasional hypoglycemia with blood sugar up to upper 50s and 60s around midnight, around evening time and occasionally in the afternoon, likely related to meal boluses.  In last 5 days.  Blood sugar is mostly acceptable with mild hyperglycemia with blood sugar up to 180 and no hypoglycemia.  Hypoglycemia: Patient has some hypoglycemic episodes. Patient has hypoglycemia awareness.    Factors modifying glucose control: 1.  Diabetic diet assessment: Almost always eats a bedtime snack.  Supper around 6 PM.  2.  Staying active or exercising: Started to walk.  3.  Medication compliance: compliant all of the time.  Interval history 08/12/23 Freestyle libre 2 CGM data  and pump data reviews as above.  86% blood sugar in the target range and GMI which is equivalent to hemoglobin A1c 6.4%.  Patient has occasional hypoglycemic episode as mentioned above.  No significant hypoglycemia.  He had hemoglobin A1c of 7.8% on September 19.  Patient mentioned that his knee surgery was canceled on October 1 due to uncontrolled diabetes status, his hemoglobin A1c was 7.8% in September.  Video Spanish language interpretation used in the clinic today.  REVIEW OF SYSTEMS As per history of present illness.   PAST MEDICAL HISTORY: Past Medical History:  Diagnosis Date   Arthritis    Blood transfusion without reported diagnosis    Diabetes mellitus without complication (HCC)    type II   History of ETOH abuse    Hypertension     PAST SURGICAL  HISTORY: Past Surgical History:  Procedure Laterality Date   BACK SURGERY N/A 2010   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   Procedure: LEFT TOTAL KNEE ARTHROPLASTY;  Surgeon: Kathryne Hitch, MD;  Location: [facility];  Service: Orthopedics;  Laterality: Left;    ALLERGIES: No Known Allergies  FAMILY HISTORY:  Family History  Problem Relation Age of Onset   Diabetes Unknown    Hypertension Unknown    Arthritis Father    Diabetes Father     SOCIAL HISTORY: Social History   Socioeconomic History   Marital status: Married    Spouse name: Not on file   Number of children: Not on file   Years of education: Not on file   Highest education level: 3rd grade  Occupational History   Not on file  Tobacco Use   Smoking status: Former   Smokeless tobacco: Former    Quit date: 11/08/1993  Vaping Use   Vaping status: Never Used  Substance and Sexual Activity   Alcohol use: No   Drug use: No   Sexual activity: Not on file  Other Topics Concern   Not on file  Social History Narrative   Not on file   Social Determinants of Health   Financial Resource Strain: Low Risk  (06/21/2023)   Overall Financial Resource Strain (CARDIA)    Difficulty of Paying Living Expenses: Not hard at all  Food Insecurity: No Food Insecurity (06/21/2023)   Hunger Vital Sign    Worried About Running Out of Food in the Last Year: Never true    Ran Out of Food in the Last Year: Never true  Transportation Needs: No Transportation Needs (06/21/2023)   PRAPARE - Administrator, Civil Service (Medical): No    Lack of Transportation (Non-Medical): No  Physical Activity: Insufficiently Active (06/21/2023)   Exercise Vital Sign    Days of Exercise per Week: 2 days    Minutes of Exercise per Session: 20 min  Stress: No Stress Concern Present (06/21/2023)   [employer] of Occupational Health - Occupational Stress Questionnaire    Feeling of Stress : Not at all  Social Connections: Socially  Integrated (06/21/2023)   Social Connection and Isolation Panel [NHANES]    Frequency of Communication with Friends and Family: More than three times a week    Frequency of Social Gatherings with Friends and Family: Twice a week    Attends Religious Services: 1 to 4 times per year    Active Member of [employer] or Organizations: Yes    Attends Banker Meetings: 1 to 4 times per year    Marital Status: Married    MEDICATIONS:  Current Outpatient Medications  Medication Sig Dispense Refill   aspirin 81 MG chewable tablet Chew 1 tablet (81 mg total) by mouth 2 (two) times daily. 60 tablet 0   atorvastatin (LIPITOR) 40 MG tablet TAKE 1 TABLET BY MOUTH EVERY MORNING AND 1/2 AT BEDTIME 135 tablet 1   B-D UF III MINI PEN NEEDLES 31G X 5 MM MISC USE FIVE PER DAY AS DIRECTED 200 each 0   Continuous Glucose Sensor (FREESTYLE LIBRE 2 SENSOR) MISC 2 Devices by Does not apply route every 14 (fourteen) days. 2 each 3   Dulaglutide (TRULICITY) 4.5 MG/0.5ML SOPN Inject 4.5 mg as directed once a week. 6 mL 3   Empagliflozin-metFORMIN HCl ER (SYNJARDY XR) 03-999 MG TB24 Take 2 tablets by mouth daily. 180 tablet 3   Glucagon (GVOKE HYPOPEN 1-PACK) 1 MG/0.2ML SOAJ Inject 1 mg into the skin as needed (low blood sugar with impaired consciousness). 0.4 mL 2   glucose blood (FREESTYLE LITE) test strip USE AS INSTRUCTED FOUR TIMES DAILY 150 strip 2   Insulin Disposable Pump (OMNIPOD DASH INTRO, GEN 4,) KIT Change every 3 days 1 kit 0   Insulin Disposable Pump (OMNIPOD DASH PODS, GEN 4,) MISC USE FOR INSULIN PUMP EVEY 3 DAYS 30 each 5   insulin lispro (HUMALOG) 100 UNIT/ML injection Max daily 100 units per pump 100 mL 3   Insulin Pen Needle 32G X 4 MM MISC Use 5 pens per day to inject insulin 200 each 3   Insulin Syringe-Needle U-100 (INSULIN SYRINGE .5CC/30GX5/16") 30G X 5/16" 0.5 ML MISC Use 3 times a day for insulin 300 each 1   losartan (COZAAR) 50 MG tablet TAKE 1 TABLET(50 MG) BY MOUTH DAILY 90 tablet 1    OVER THE COUNTER MEDICATION Take 2 tablets by mouth daily. 4LIFE TRANSFER FACTORY RECALL     No current facility-administered medications for this visit.    PHYSICAL EXAM: Vitals:   08/12/23 0844  BP: 120/60  Pulse: 75  SpO2: 96%  Weight: 195 lb 3.2 oz (88.5 kg)  Height: 5\' 4"  (1.626 m)    Body mass index is 33.51 kg/m.  Wt Readings from Last 3 Encounters:  08/12/23 195 lb 3.2 oz (88.5 kg)  07/28/23 198 lb 6.4 oz (90 kg)  07/07/23 200 lb 12.8 oz (91.1 kg)    General: Well developed, well nourished male in no apparent distress.  HEENT: AT/[place], no external lesions.  Eyes: Conjunctiva clear and no icterus. Neck: Neck supple  Lungs: Respirations not labored Neurologic: Alert, oriented, normal speech Extremities / Skin: Dry. No sores or rashes noted. No acanthosis nigricans Psychiatric: Does not appear depressed or anxious  Diabetic Foot Exam - Simple   No data filed    LABS Reviewed Lab Results  Component Value Date   HGBA1C 7.8 (H) 07/28/2023   HGBA1C 7.4 (A) 06/15/2023   HGBA1C 7.2 (A) 01/19/2023   Lab Results  Component Value Date   FRUCTOSAMINE 302 (H) 07/20/2017   Lab Results  Component Value Date   CHOL 96 08/24/2022   HDL 38.70 (L) 08/24/2022   LDLCALC 17 08/24/2022   LDLDIRECT 44.0 01/24/2020   TRIG 199.0 (H) 08/24/2022   CHOLHDL 2 08/24/2022   Lab Results  Component Value Date   MICRALBCREAT 1.5 06/15/2023   MICRALBCREAT 0.7 05/24/2022   Lab Results  Component Value Date   CREATININE 0.82 07/28/2023   Lab Results  Component Value Date   GFR 86.83 06/22/2023    ASSESSMENT / PLAN  1.  Type 2 diabetes mellitus without complication, with long-term current use of insulin (HCC)   2. Insulin pump status      Diagnoses and all orders for this visit:  Type 2 diabetes mellitus without complication, with long-term current use of insulin (HCC)  Insulin pump status     Diabetes Mellitus type 2  - Diabetic status / severity: fair  control  Lab Results  Component Value Date   HGBA1C 7.8 (H) 07/28/2023    - Hemoglobin A1c goal < 7-7.5% range.   Patient has reasonable control of diabetes mellitus and it has been improving.  GMI from CGM is 6.4% which is equivalent to hemoglobin A1c.  86% blood sugar are in target range.  He is also having occasional hypoglycemia.  Insulin pump setting adjusted as follows with decreasing basal rate.  He had some hypoglycemia related with meal manual bolus, advised to decrease the bolus insulin if the meal sizes smaller or if there is any increase physical activity or exercise.  - Medications:  OmniPod Dash pump setting changed as follows: Insulin Pump setting:  Basal MN-    0.9 u/hour 2:30AM-       1.2 , changed to 1.0. 8AM-    1.65 , changed to 1.5 Add 10 PM:  1.0 units/hr        Bolus CHO Ratio (1unit:CHO) MN- 1:1  Correction/Sensitivity: MN- 1:25  Discussed about adjusting fixed bolus of insulin for his meals 8 to 16 units based on meal size and blood sugar normal at that time. He does not carb count.  He is currently doing 8 to 18 units.  Advised to decrease highest bolus from 18 to 16 units. Advised to take bolus insulin less by 2 units when he has increased physical activity for example walking.  Target: 130   Active insulin time: 4 hours  -Continue Synjardy 03-999 mg 2 tab daily.   -Continue Trulicity 4.5 mg weekly.  - Home glucose testing: continue CGM and check blood glucose as needed.  - Discussed/ Gave Hypoglycemia treatment plan.  # Consult : not required at this time.   # Annual urine for microalbuminuria/ creatinine ratio, no microalbuminuria currently, continue losartan.  Last  Lab Results  Component Value Date   MICRALBCREAT 1.5 06/15/2023    # Foot check nightly.  # Annual dilated diabetic eye exams.   - Diet: Make healthy diabetic food choices, advised to avoid high carbohydrate meal and sweets and sugary meals. - Life style / activity /  exercise: Discussed.  Advised for walking and increase physical activity.  Blood pressure  -  BP Readings from Last 1 Encounters:  08/12/23 120/60    - Control is in target.  - No change in current plans.  Lipid status / Hyperlipidemia - Last  Lab Results  Component Value Date   LDLCALC 17 08/24/2022   - Continue atorvastatin 40 mg daily.  # In regard to knee surgery canceled recently due to elevated hemoglobin A1c.  Since his GMI (it is equivalent to hemoglobin A1c ) on CGM is 6.4% which is reasonable and acceptable control of diabetes mellitus, if he maintains the same control of diabetes it is reasonable to go for knee surgery with caution, no absolute contraindication.  I will forward this note to his primary care provider.  DISPOSITION Follow up in clinic in as scheduled in October 10, 2023.   All questions answered and patient verbalized understanding of the plan.  Samuel Tramayne Sebesta, MD [facility] [address], [address] [place], [place] [address] Phone # [phone]  At least part of this note was generated using voice recognition software. Inadvertent word errors may have occurred, which were not recognized during the proofreading process.

## 2023-08-15 ENCOUNTER — Other Ambulatory Visit (INDEPENDENT_AMBULATORY_CARE_PROVIDER_SITE_OTHER): Payer: Medicare Other

## 2023-08-15 ENCOUNTER — Encounter: Payer: Self-pay | Admitting: Endocrinology

## 2023-08-15 DIAGNOSIS — E785 Hyperlipidemia, unspecified: Secondary | ICD-10-CM | POA: Diagnosis not present

## 2023-08-15 LAB — LIPID PANEL
Cholesterol: 84 mg/dL (ref 0–200)
HDL: 48.7 mg/dL (ref >39.00)
LDL Cholesterol: 14 mg/dL (ref 0–99)
NonHDL: 35.78
Total CHOL/HDL Ratio: 2
Triglycerides: 107 mg/dL (ref 0.0–149.0)
VLDL: 21.4 mg/dL (ref 0.0–40.0)

## 2023-08-16 ENCOUNTER — Ambulatory Visit (INDEPENDENT_AMBULATORY_CARE_PROVIDER_SITE_OTHER): Payer: Medicare Other | Admitting: Medical

## 2023-08-16 ENCOUNTER — Encounter: Payer: Self-pay | Admitting: Medical

## 2023-08-16 VITALS — BP 120/60 | HR 74 | Resp 18 | Ht 64.0 in | Wt 195.2 lb

## 2023-08-16 DIAGNOSIS — Z794 Long term (current) use of insulin: Secondary | ICD-10-CM | POA: Diagnosis not present

## 2023-08-16 DIAGNOSIS — E785 Hyperlipidemia, unspecified: Secondary | ICD-10-CM

## 2023-08-16 DIAGNOSIS — E119 Type 2 diabetes mellitus without complications: Secondary | ICD-10-CM

## 2023-08-16 DIAGNOSIS — I10 Essential (primary) hypertension: Secondary | ICD-10-CM | POA: Diagnosis not present

## 2023-08-16 DIAGNOSIS — Z23 Encounter for immunization: Secondary | ICD-10-CM

## 2023-08-16 NOTE — Patient Instructions (Addendum)
1. Type 2 diabetes mellitus without complication, with long-term current use of insulin  -continue with plan for diabetes per endocrinologist. - Send note to ortho asking goal for A1-C goal in order to do surgery. Will pass message to endocrinologist in order to modify plan.  2. Essential hypertension, benign -bp controlled. Continue losartan 50 mg daily  3. Hyperlipidemia, unspecified hyperlipidemia type -lipids well controlled. Continue atorvastatin.  4. Upcoming rt knee replacement on hold due to high A1c-  Flu vaccine today. Can get shingrix vaccine thru the pharmacy.  Follow up 3 months or sooner if needed.

## 2023-08-16 NOTE — Progress Notes (Signed)
Subjective:    Patient ID: Samuel [name], male    DOB: 1951/08/26, 72 y.o.   MRN: [id number]  HPI  Pt has followed up with his new endocrinolgist.  "Diabetes Mellitus type 2  - Diabetic status / severity: fair control   Recent Labs       Lab Results  Component Value Date    HGBA1C 7.8 (H) 07/28/2023        - Hemoglobin A1c goal < 7-7.5% range.    Patient has reasonable control of diabetes mellitus and it has been improving.  GMI from CGM is 6.4% which is equivalent to hemoglobin A1c.  86% blood sugar are in target range.  He is also having occasional hypoglycemia.  Insulin pump setting adjusted as follows with decreasing basal rate.   He had some hypoglycemia related with meal manual bolus, advised to decrease the bolus insulin if the meal sizes smaller or if there is any increase physical activity or exercise."  Pt went to his orthopedist. Dr. Magnus Ivan office due to his elevated A1c.  Pt is not sure what A1c goal is for surgery.  Htn- bp is tightly controlled. No dizziness on changing positions.      Review of Systems  Constitutional:  Negative for chills, fatigue and fever.  Respiratory:  Negative for cough, chest tightness and wheezing.   Cardiovascular:  Negative for chest pain and palpitations.  Gastrointestinal:  Negative for abdominal pain.  Genitourinary:  Negative for dysuria and flank pain.  Musculoskeletal:  Negative for back pain.  Neurological:  Negative for dizziness, seizures, syncope, weakness and headaches.  Hematological:  Negative for adenopathy. Does not bruise/bleed easily.  Psychiatric/Behavioral:  Negative for behavioral problems and confusion.     Past Medical History:  Diagnosis Date   Arthritis    Blood transfusion without reported diagnosis    Diabetes mellitus without complication (HCC)    type II   History of ETOH abuse    Hypertension      Social History   Socioeconomic History   Marital status: Married    Spouse name: Not  on file   Number of children: Not on file   Years of education: Not on file   Highest education level: 3rd grade  Occupational History   Not on file  Tobacco Use   Smoking status: Former   Smokeless tobacco: Former    Quit date: 11/08/1993  Vaping Use   Vaping status: Never Used  Substance and Sexual Activity   Alcohol use: No   Drug use: No   Sexual activity: Not on file  Other Topics Concern   Not on file  Social History Narrative   Not on file   Social Determinants of Health   Financial Resource Strain: Low Risk  (06/21/2023)   Overall Financial Resource Strain (CARDIA)    Difficulty of Paying Living Expenses: Not hard at all  Food Insecurity: No Food Insecurity (06/21/2023)   Hunger Vital Sign    Worried About Running Out of Food in the Last Year: Never true    Ran Out of Food in the Last Year: Never true  Transportation Needs: No Transportation Needs (06/21/2023)   PRAPARE - Administrator, Civil Service (Medical): No    Lack of Transportation (Non-Medical): No  Physical Activity: Insufficiently Active (06/21/2023)   Exercise Vital Sign    Days of Exercise per Week: 2 days    Minutes of Exercise per Session: 20 min  Stress: No Stress Concern  Present (06/21/2023)   [employer] of Occupational Health - Occupational Stress Questionnaire    Feeling of Stress : Not at all  Social Connections: Socially Integrated (06/21/2023)   Social Connection and Isolation Panel [NHANES]    Frequency of Communication with Friends and Family: More than three times a week    Frequency of Social Gatherings with Friends and Family: Twice a week    Attends Religious Services: 1 to 4 times per year    Active Member of [employer] or Organizations: Yes    Attends Banker Meetings: 1 to 4 times per year    Marital Status: Married  Catering manager Violence: Unknown (02/08/2022)   Received from [employer], [facility]   HITS    Physically Hurt: Not on file    Insult or  Talk Down To: Not on file    Threaten Physical Harm: Not on file    Scream or Curse: Not on file    Past Surgical History:  Procedure Laterality Date   BACK SURGERY N/A 2010   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   Procedure: LEFT TOTAL KNEE ARTHROPLASTY;  Surgeon: Kathryne Hitch, MD;  Location: [facility];  Service: Orthopedics;  Laterality: Left;    Family History  Problem Relation Age of Onset   Diabetes Unknown    Hypertension Unknown    Arthritis Father    Diabetes Father     No Known Allergies  Current Outpatient Medications on File Prior to Visit  Medication Sig Dispense Refill   aspirin 81 MG chewable tablet Chew 1 tablet (81 mg total) by mouth 2 (two) times daily. 60 tablet 0   atorvastatin (LIPITOR) 40 MG tablet TAKE 1 TABLET BY MOUTH EVERY MORNING AND 1/2 AT BEDTIME 135 tablet 1   B-D UF III MINI PEN NEEDLES 31G X 5 MM MISC USE FIVE PER DAY AS DIRECTED 200 each 0   Continuous Glucose Sensor (FREESTYLE LIBRE 2 SENSOR) MISC 2 Devices by Does not apply route every 14 (fourteen) days. 2 each 3   Dulaglutide (TRULICITY) 4.5 MG/0.5ML SOPN Inject 4.5 mg as directed once a week. 6 mL 3   Empagliflozin-metFORMIN HCl ER (SYNJARDY XR) 03-999 MG TB24 Take 2 tablets by mouth daily. 180 tablet 3   Glucagon (GVOKE HYPOPEN 1-PACK) 1 MG/0.2ML SOAJ Inject 1 mg into the skin as needed (low blood sugar with impaired consciousness). 0.4 mL 2   glucose blood (FREESTYLE LITE) test strip USE AS INSTRUCTED FOUR TIMES DAILY 150 strip 2   Insulin Disposable Pump (OMNIPOD DASH INTRO, GEN 4,) KIT Change every 3 days 1 kit 0   Insulin Disposable Pump (OMNIPOD DASH PODS, GEN 4,) MISC USE FOR INSULIN PUMP EVEY 3 DAYS 30 each 5   insulin lispro (HUMALOG) 100 UNIT/ML injection Max daily 100 units per pump 100 mL 3   Insulin Pen Needle 32G X 4 MM MISC Use 5 pens per day to inject insulin 200 each 3   Insulin Syringe-Needle U-100 (INSULIN SYRINGE .5CC/30GX5/16") 30G X 5/16" 0.5 ML MISC Use 3 times  a day for insulin 300 each 1   losartan (COZAAR) 50 MG tablet TAKE 1 TABLET(50 MG) BY MOUTH DAILY 90 tablet 1   OVER THE COUNTER MEDICATION Take 2 tablets by mouth daily. 4LIFE TRANSFER FACTORY RECALL     No current facility-administered medications on file prior to visit.    BP 120/60   Pulse 74   Resp 18   Ht 5\' 4"  (1.626 m)  Wt 195 lb 3.2 oz (88.5 kg)   SpO2 96%   BMI 33.51 kg/m        Objective:   Physical Exam  General Mental Status- Alert. General Appearance- Not in acute distress.   Skin General: Color- Normal Color. Moisture- Normal Moisture.  Neck Carotid Arteries- Normal color. Moisture- Normal Moisture. No carotid bruits. No JVD.  Chest and Lung Exam Auscultation: Breath Sounds:-Normal.  Cardiovascular Auscultation:Rythm- Regular. Murmurs & Other Heart Sounds:Auscultation of the heart reveals- No Murmurs.  Abdomen Inspection:-Inspeection Normal. Palpation/Percussion:Note:No mass. Palpation and Percussion of the abdomen reveal- Non Tender, Non Distended + BS, no rebound or guarding.   Neurologic Cranial Nerve exam:- CN III-XII intact(No nystagmus), symmetric smile. Strength:- 5/5 equal and symmetric strength both upper and lower extremities.       Assessment & Plan:  1. Type 2 diabetes mellitus without complication, with long-term current use of insulin  -continue with plan for diabetes per endocrinologist. - Send note to ortho asking goal for A1-C goal in order to do surgery. Will pass message to endocrinologist in order to modify plan.  2. Essential hypertension, benign -bp controlled. Continue losartan 50 mg daily  3. Hyperlipidemia, unspecified hyperlipidemia type -lipids well controlled. Continue atorvastatin.  4. Upcoming rt knee replacement on hold due to high A1c-  Flu vaccine today. Can get shingrix vaccine thru the pharmacy.  Follow up 3 months or sooner if needed.  Esperanza Richters, PA-C

## 2023-08-16 NOTE — Telephone Encounter (Signed)
Dr. Magnus Ivan,  I am seeing mutual pt Samuel [name]. I understand you have plans to do knee replacement surgery. Recent surgery delayed/canceled per patient due to his A1c being elevated at 7.8. Pt wants to know what goal would be in order to do surgery. I can let pt know and also send message to his endocrinologist. Thanks for your help with our patients.  Esperanza Richters, PA-C

## 2023-08-16 NOTE — Addendum Note (Signed)
Addended by: Maximino Sarin on: 08/16/2023 04:13 PM   Modules accepted: Orders

## 2023-08-18 ENCOUNTER — Other Ambulatory Visit: Payer: Self-pay

## 2023-08-18 DIAGNOSIS — E1165 Type 2 diabetes mellitus with hyperglycemia: Secondary | ICD-10-CM

## 2023-08-19 ENCOUNTER — Other Ambulatory Visit: Payer: Self-pay

## 2023-08-19 DIAGNOSIS — E1165 Type 2 diabetes mellitus with hyperglycemia: Secondary | ICD-10-CM

## 2023-08-22 ENCOUNTER — Other Ambulatory Visit (INDEPENDENT_AMBULATORY_CARE_PROVIDER_SITE_OTHER): Payer: Medicare Other

## 2023-08-22 ENCOUNTER — Encounter: Payer: Medicare Other | Admitting: Orthopaedic Surgery

## 2023-08-22 DIAGNOSIS — E1165 Type 2 diabetes mellitus with hyperglycemia: Secondary | ICD-10-CM

## 2023-08-22 DIAGNOSIS — Z794 Long term (current) use of insulin: Secondary | ICD-10-CM | POA: Diagnosis not present

## 2023-08-22 LAB — HEMOGLOBIN A1C: Hgb A1c MFr Bld: 7.8 % — ABNORMAL HIGH (ref 4.6–6.5)

## 2023-08-26 ENCOUNTER — Other Ambulatory Visit: Payer: Self-pay | Admitting: Medical

## 2023-08-26 ENCOUNTER — Telehealth (HOSPITAL_BASED_OUTPATIENT_CLINIC_OR_DEPARTMENT_OTHER): Payer: Self-pay

## 2023-10-10 ENCOUNTER — Encounter: Payer: Self-pay | Admitting: Endocrinology

## 2023-10-10 ENCOUNTER — Ambulatory Visit: Payer: Medicare Other | Admitting: Endocrinology

## 2023-10-10 VITALS — BP 128/70 | HR 78 | Resp 20 | Ht 64.0 in | Wt 199.4 lb

## 2023-10-10 DIAGNOSIS — Z794 Long term (current) use of insulin: Secondary | ICD-10-CM

## 2023-10-10 DIAGNOSIS — E1165 Type 2 diabetes mellitus with hyperglycemia: Secondary | ICD-10-CM | POA: Diagnosis not present

## 2023-10-10 LAB — POCT GLYCOSYLATED HEMOGLOBIN (HGB A1C): Hemoglobin A1C: 6.9 % — AB (ref 4.0–5.6)

## 2023-10-10 NOTE — Patient Instructions (Addendum)
No change for diabetes.   Latest Reference Range & Units 07/28/23 15:30 08/22/23 08:37 10/10/23 13:41  Hemoglobin A1C 4.0 - 5.6 % 7.8 (H) 7.8 (H) 6.9 !  (H): Data is abnormally high !: Data is abnormal

## 2023-10-10 NOTE — Progress Notes (Signed)
Outpatient Endocrinology Note [place] Reuben Knoblock, MD  10/10/23  Patient Name: Samuel [name]   DOB : 13-Nov-1950 MRN # [id number]                                                    REASON OF VISIT: Follow up for type 2 diabetes mellitus  PCP: Esperanza Richters, PA-C  HISTORY OF PRESENT ILLNESS:   Samuel [name] is a 72 y.o. old male with past medical history listed below, is here for follow up for type 2 diabetes mellitus.   Pertinent Diabetes History:  _Diagnosed as type 2 in 1990.  Patient was initially treated with metformin and later insulin added.  He was mostly on insulin 70/30 regimen in the past.  At some point he was also on basal bolus insulin with Toujeo and Humalog.    OmniPod Dash insulin pump started in 2018/2019.  Chronic Diabetes Complications : Retinopathy: no. Last ophthalmology exam was done on annually, reportedly. Nephropathy: no, on losartan Peripheral neuropathy: no Coronary artery disease: no Stroke: no  Relevant comorbidities and cardiovascular risk factors: Obesity: yes Body mass index is 34.23 kg/m.  Hypertension: Yes Hyperlipidemia.  Yes, on statin.  Current / Home Diabetic regimen includes: Omnipod 4 / Dash /not with sensor.  He uses Humalog U100. Synjardy 03-999 mg 2 tab daily.  Trulicity 4.5mg  weekly.  Insulin Pump setting:  Basal  MN-    0.9 u/hour 2:30AM- 1.0  8AM-    1.5   10 PM- 1.0       Bolus CHO Ratio (1unit:CHO) MN- 1:1 ( using fixed manual bolus 8-16 units)  Correction/Sensitivity: MN- 1:25  Target: 130   Active insulin time: 4 hours  Prior diabetic medications: Insulin 70/30, Toujeo  INSULIN PUMP DATA:                         Average total daily insulin: 78  units, Basal: 39%, Bolus: 61 %  He does not use sensor with OmniPod.  He does not do carb counting.  He uses fixed dose bolus from 8 to 16 units depending upon the blood sugar and food size with meals, 3-5 times a day.   Glycemic data:    CONTINUOUS GLUCOSE  MONITORING SYSTEM (CGMS) INTERPRETATION:  FreeStyle Libre 2 CGM-  Sensor Download (Sensor download was reviewed and summarized below.) Dates: November 19 - December 2 , 2024, 14 days. Sensor Average: 135  Glucose Management Indicator: 6.5% Glucose Variability: 24.8%  % data captured: 98%  Glycemic Trends:  <54: 0% 54-70: 0% 71-180: 89% 181-250: 11% 251-400: 0%  Interpretation: Mostly acceptable blood sugar, in between the meals and overnight.  Mild hyperglycemia with blood sugar up to 200 related with meals.  Very rare hypoglycemia with blood sugar in upper 60s.  No concerning hypoglycemia.  GMI 6.5%.  Hypoglycemia: Patient has no concerning hypoglycemic episodes. Patient has hypoglycemia awareness.    Factors modifying glucose control: 1.  Diabetic diet assessment: Almost always eats a bedtime snack.  Supper around 6 PM.  2.  Staying active or exercising: Started to walk.  3.  Medication compliance: compliant all of the time.  Interval history  CGM data as reviewed above.  Pump data reviewed and as noted above.  He has significant improvement in his stability of blood sugar.  No concerning hypoglycemia.  GMI on CGM 6.5%.  Hemoglobin A1c today 6.9%.  He has no other complaints today.  Also talked with patient's daughter over the phone.  Spanish language interpretation video used in the clinic visit.  Patient mentioned that his knee surgery was canceled on October 1 due to uncontrolled diabetes status, his hemoglobin A1c was 7.8% in September and October.  REVIEW OF SYSTEMS As per history of present illness.   PAST MEDICAL HISTORY: Past Medical History:  Diagnosis Date   Arthritis    Blood transfusion without reported diagnosis    Diabetes mellitus without complication (HCC)    type II   History of ETOH abuse    Hypertension     PAST SURGICAL HISTORY: Past Surgical History:  Procedure Laterality Date   BACK SURGERY N/A 2010   TOTAL KNEE ARTHROPLASTY Left  08/07/2021   Procedure: LEFT TOTAL KNEE ARTHROPLASTY;  Surgeon: Kathryne Hitch, MD;  Location: [facility];  Service: Orthopedics;  Laterality: Left;    ALLERGIES: No Known Allergies  FAMILY HISTORY:  Family History  Problem Relation Age of Onset   Diabetes Unknown    Hypertension Unknown    Arthritis Father    Diabetes Father     SOCIAL HISTORY: Social History   Socioeconomic History   Marital status: Married    Spouse name: Not on file   Number of children: Not on file   Years of education: Not on file   Highest education level: 3rd grade  Occupational History   Not on file  Tobacco Use   Smoking status: Former   Smokeless tobacco: Former    Quit date: 11/08/1993  Vaping Use   Vaping status: Never Used  Substance and Sexual Activity   Alcohol use: No   Drug use: No   Sexual activity: Not on file  Other Topics Concern   Not on file  Social History Narrative   Not on file   Social Determinants of Health   Financial Resource Strain: Low Risk  (06/21/2023)   Overall Financial Resource Strain (CARDIA)    Difficulty of Paying Living Expenses: Not hard at all  Food Insecurity: No Food Insecurity (06/21/2023)   Hunger Vital Sign    Worried About Running Out of Food in the Last Year: Never true    Ran Out of Food in the Last Year: Never true  Transportation Needs: No Transportation Needs (06/21/2023)   PRAPARE - Administrator, Civil Service (Medical): No    Lack of Transportation (Non-Medical): No  Physical Activity: Insufficiently Active (06/21/2023)   Exercise Vital Sign    Days of Exercise per Week: 2 days    Minutes of Exercise per Session: 20 min  Stress: No Stress Concern Present (06/21/2023)   [employer] of Occupational Health - Occupational Stress Questionnaire    Feeling of Stress : Not at all  Social Connections: Socially Integrated (06/21/2023)   Social Connection and Isolation Panel [NHANES]    Frequency of Communication with Friends  and Family: More than three times a week    Frequency of Social Gatherings with Friends and Family: Twice a week    Attends Religious Services: 1 to 4 times per year    Active Member of [employer] or Organizations: Yes    Attends Banker Meetings: 1 to 4 times per year    Marital Status: Married    MEDICATIONS:  Current Outpatient Medications  Medication Sig Dispense Refill   aspirin 81 MG chewable  tablet Chew 1 tablet (81 mg total) by mouth 2 (two) times daily. 60 tablet 0   atorvastatin (LIPITOR) 40 MG tablet TAKE 1 TABLET BY MOUTH EVERY MORNING AND 1/2 AT BEDTIME 135 tablet 1   B-D UF III MINI PEN NEEDLES 31G X 5 MM MISC USE FIVE PER DAY AS DIRECTED 200 each 0   Continuous Glucose Sensor (FREESTYLE LIBRE 2 SENSOR) MISC 2 Devices by Does not apply route every 14 (fourteen) days. 2 each 3   Dulaglutide (TRULICITY) 4.5 MG/0.5ML SOPN Inject 4.5 mg as directed once a week. 6 mL 3   Empagliflozin-metFORMIN HCl ER (SYNJARDY XR) 03-999 MG TB24 Take 2 tablets by mouth daily. 180 tablet 3   Glucagon (GVOKE HYPOPEN 1-PACK) 1 MG/0.2ML SOAJ Inject 1 mg into the skin as needed (low blood sugar with impaired consciousness). 0.4 mL 2   glucose blood (FREESTYLE LITE) test strip USE AS INSTRUCTED FOUR TIMES DAILY 150 strip 2   Insulin Disposable Pump (OMNIPOD DASH INTRO, GEN 4,) KIT Change every 3 days 1 kit 0   Insulin Disposable Pump (OMNIPOD DASH PODS, GEN 4,) MISC USE FOR INSULIN PUMP EVEY 3 DAYS 30 each 5   insulin lispro (HUMALOG) 100 UNIT/ML injection Max daily 100 units per pump 100 mL 3   Insulin Pen Needle 32G X 4 MM MISC Use 5 pens per day to inject insulin 200 each 3   Insulin Syringe-Needle U-100 (INSULIN SYRINGE .5CC/30GX5/16") 30G X 5/16" 0.5 ML MISC Use 3 times a day for insulin 300 each 1   losartan (COZAAR) 50 MG tablet TAKE 1 TABLET(50 MG) BY MOUTH DAILY 90 tablet 1   OVER THE COUNTER MEDICATION Take 2 tablets by mouth daily. 4LIFE TRANSFER FACTORY RECALL     No current  facility-administered medications for this visit.    PHYSICAL EXAM: Vitals:   10/10/23 1317  BP: 128/70  Pulse: 78  Resp: 20  SpO2: 96%  Weight: 199 lb 6.4 oz (90.4 kg)  Height: 5\' 4"  (1.626 m)    Body mass index is 34.23 kg/m.  Wt Readings from Last 3 Encounters:  10/10/23 199 lb 6.4 oz (90.4 kg)  08/16/23 195 lb 3.2 oz (88.5 kg)  08/12/23 195 lb 3.2 oz (88.5 kg)    General: Well developed, well nourished male in no apparent distress.  HEENT: AT/[place], no external lesions.  Eyes: Conjunctiva clear and no icterus. Neck: Neck supple  Lungs: Respirations not labored Neurologic: Alert, oriented, normal speech Extremities / Skin: Dry. No sores or rashes noted.  Psychiatric: Does not appear depressed or anxious  Diabetic Foot Exam - Simple   No data filed    LABS Reviewed Lab Results  Component Value Date   HGBA1C 6.9 (A) 10/10/2023   HGBA1C 7.8 (H) 08/22/2023   HGBA1C 7.8 (H) 07/28/2023   Lab Results  Component Value Date   FRUCTOSAMINE 302 (H) 07/20/2017   Lab Results  Component Value Date   CHOL 84 08/15/2023   HDL 48.70 08/15/2023   LDLCALC 14 08/15/2023   LDLDIRECT 44.0 01/24/2020   TRIG 107.0 08/15/2023   CHOLHDL 2 08/15/2023   Lab Results  Component Value Date   MICRALBCREAT 1.5 06/15/2023   MICRALBCREAT 0.7 05/24/2022   Lab Results  Component Value Date   CREATININE 0.82 07/28/2023   Lab Results  Component Value Date   GFR 86.83 06/22/2023    ASSESSMENT / PLAN  1. Type 2 diabetes mellitus with hyperglycemia, with long-term current use of insulin (HCC)  Diagnoses and all orders for this visit:  Type 2 diabetes mellitus with hyperglycemia, with long-term current use of insulin (HCC) -     POCT glycosylated hemoglobin (Hb A1C)      Diabetes Mellitus type 2  - Diabetic status / severity: Controlled  Lab Results  Component Value Date   HGBA1C 6.9 (A) 10/10/2023    - Hemoglobin A1c goal < 7-7.5% range.   He has improvement  on diabetes control, hemoglobin A1c today 6.9% improved from 7.8% 2 months ago.  - Medications:  OmniPod Dash pump setting changed as follows: Insulin Pump setting: No change in the pump settings today. Basal MN-    0.9 u/hour 2:30AM-       1.0 8AM-    1.5 10 PM:         1.0 units/hr        Bolus CHO Ratio (1unit:CHO) MN- 1:1  Correction/Sensitivity: MN- 1:25  Discussed about adjusting fixed bolus of insulin for his meals 8 to 16 units based on meal size and blood sugar normal at that time. He does not carb count.  Advised to take bolus insulin less by 2 units when he has increased physical activity for example walking.  Target: 130   Active insulin time: 4 hours  -Continue Synjardy 03-999 mg 2 tab daily.   -Continue Trulicity 4.5 mg weekly.  - Home glucose testing: continue CGM and check blood glucose as needed.  - Discussed/ Gave Hypoglycemia treatment plan.  # Consult : not required at this time.   # Annual urine for microalbuminuria/ creatinine ratio, no microalbuminuria currently, continue losartan.  Last  Lab Results  Component Value Date   MICRALBCREAT 1.5 06/15/2023    # Foot check nightly.  # Annual dilated diabetic eye exams.   - Diet: Make healthy diabetic food choices, advised to avoid high carbohydrate meal and sweets and sugary meals. - Life style / activity / exercise: Discussed.  Advised for walking and increase physical activity.  Blood pressure  -  BP Readings from Last 1 Encounters:  10/10/23 128/70    - Control is in target.  - No change in current plans.  Lipid status / Hyperlipidemia - Last  Lab Results  Component Value Date   LDLCALC 14 08/15/2023   - Continue atorvastatin 40 mg daily.   DISPOSITION Follow up in clinic in 3 months suggested.   All questions answered and patient verbalized understanding of the plan.  [place] Greidy Sherard, MD [facility] Endocrinology [facility] [address] [place], [address] [place],  [place] [address] Phone # [phone]  At least part of this note was generated using voice recognition software. Inadvertent word errors may have occurred, which were not recognized during the proofreading process.

## 2023-10-11 ENCOUNTER — Encounter: Payer: Self-pay | Admitting: Endocrinology

## 2023-10-18 ENCOUNTER — Other Ambulatory Visit: Payer: Self-pay | Admitting: Medical

## 2023-10-18 ENCOUNTER — Telehealth: Payer: Self-pay

## 2023-10-18 ENCOUNTER — Ambulatory Visit (INDEPENDENT_AMBULATORY_CARE_PROVIDER_SITE_OTHER): Payer: Medicare Other | Admitting: Medical

## 2023-10-18 VITALS — BP 130/65 | HR 75 | Temp 98.2°F | Resp 18 | Wt 202.2 lb

## 2023-10-18 DIAGNOSIS — G5603 Carpal tunnel syndrome, bilateral upper limbs: Secondary | ICD-10-CM

## 2023-10-18 DIAGNOSIS — J309 Allergic rhinitis, unspecified: Secondary | ICD-10-CM | POA: Diagnosis not present

## 2023-10-18 DIAGNOSIS — I10 Essential (primary) hypertension: Secondary | ICD-10-CM

## 2023-10-18 MED ORDER — FLUTICASONE PROPIONATE 50 MCG/ACT NA SUSP
2.0000 | Freq: Every day | NASAL | 1 refills | Status: AC
Start: 2023-10-18 — End: ?

## 2023-10-18 MED ORDER — LEVOCETIRIZINE DIHYDROCHLORIDE 5 MG PO TABS: 5.0000 mg | ORAL_TABLET | Freq: Every evening | ORAL | 3 refills | Status: AC

## 2023-10-18 NOTE — Patient Instructions (Addendum)
Carpal Tunnel Syndrome Intermittent numbness and tingling in the hands, predominantly on the left, for the past three weeks. No history of neck pain or radiating arm pain. -Consider using wrist braces(wrist cock up splints) at night or during the day if symptoms worsen. -Take over-the-counter anti-inflammatory medication such as ibuprofen 200-400mg  every 8 hours or twice daily. -If symptoms worsen, consider referral to a hand specialist for nerve conduction studies and possible surgical intervention.  Allergic Rhinitis Recent onset of frequent sneezing and watery nasal discharge, possibly due to allergies. -Take Xyzal (antihistamine) at night. -Consider adding Flonase (nasal spray) if symptoms persist.  Hypertension Well controlled on Losartan 50mg  daily. -Continue Losartan 50mg  daily.  Follow up one month or sooner if needed   Sndrome del tnel carpiano Carpal Tunnel Syndrome  El sndrome del tnel carpiano es una afeccin que causa dolor, adormecimiento y debilidad en la mano y los dedos. El tnel carpiano es un rea estrecha ubicada en el lado palmar de la [place]. Los movimientos repetidos de la mueca o determinadas enfermedades pueden causar la hinchazn del tnel. Esta hinchazn comprime el nervio principal de la [place]. El nervio principal de la [place] se llama "nervio mediano". Cules son las causas? Esta afeccin puede ser causada por lo siguiente: Movimientos repetidos y enrgicos de la [place] y Engineer, site. Lesiones en la [place]. Artritis. Un quiste o un tumor en el tnel carpiano. Acumulacin de lquido Academic librarian. Uso de herramientas que vibran. A veces, se desconoce la causa de esta afeccin. Qu incrementa el riesgo? Los siguientes factores pueden hacer que sea ms propenso a Aeronautical engineer afeccin: Tener un trabajo que requiera que mueva la mueca o la mano repetitivamente o enrgicamente o que utilice herramientas que vibran. Estos pueden ser, [employer],  los trabajos que implican usar una computadora, trabajar en una lnea de ensamblaje o trabajar con herramientas elctricas como taladros y lijadoras. Ser mujer. Tener ciertas afecciones, tales como: Diabetes. Obesidad. Tiroides hipoactiva (hipotiroidismo). Insuficiencia renal. Artritis reumatoide. Cules son los signos o sntomas? Los sntomas de esta afeccin incluyen: Sensacin de hormigueo en los dedos de la mano, especialmente el pulgar, el ndice y el dedo [place]. Hormigueo o adormecimiento en la mano. Sensacin de Engineer, mining en todo el brazo, especialmente cuando la [place] y el codo estn flexionados durante [place]. Dolor en la mueca que sube por el brazo hasta el hombro. Dolor que baja hasta la palma de la mano o los dedos. Sensacin de [employer]. Tal vez tenga dificultad para tomar y Personal assistant. Los sntomas pueden empeorar durante la noche. Cmo se diagnostica? Esta afeccin se diagnostica mediante los antecedentes mdicos y un examen fsico. Tambin pueden hacerle estudios, que incluyen los siguientes: Un electromiograma (EMG). Esta prueba mide las seales elctricas que los nervios les envan a los msculos. Estudio de [employer]. Este estudio permite determinar si las seales elctricas pasan correctamente por los nervios. Estudios de diagnstico por imgenes, como radiografas, una ecografa y Hotel manager (RM). Estos estudios Education officer, community las posibles causas de la afeccin. Cmo se trata? El tratamiento de esta afeccin puede incluir: Cambios en el estilo de vida. Es importante que deje o cambie la actividad que caus la afeccin. Hacer ejercicio y actividades para fortalecer y Furniture conservator/restorer los msculos y los tendones (fisioterapia). Hacer cambios en el estilo de vida que lo ayuden con su afeccin y aprender a Education officer, environmental sus actividades diarias de forma segura (terapia ocupacional). Analgsicos y antiinflamatorios. Esto puede incluir  medicamentos que  se inyectan en la mueca. Una frula o un dispositivo ortopdico para la [place]. [place]. Siga estas instrucciones en su casa: Si tiene una frula o un dispositivo ortopdico: Use la frula o el dispositivo ortopdico como se lo haya indicado el mdico. Quteselos solamente como se lo haya indicado el mdico. Afloje la frula o el dispositivo ortopdico si los dedos de las manos se le adormecen, siente hormigueos o se le enfran y se tornan de Research officer, trade union. Mantenga la frula o el dispositivo ortopdico limpios. Si la frula o el dispositivo ortopdico no son impermeables: No deje que se mojen. Cbralos con un envoltorio hermtico cuando tome un bao de inmersin o una ducha. Control del dolor, la rigidez y la hinchazn Si se lo indican, aplique hielo sobre la zona dolorida. Para hacer esto: Si tiene una frula o un dispositivo ortopdico desmontable, quteselos como se lo haya indicado el mdico. Ponga el hielo en una bolsa plstica. Coloque una [employer] piel y la [place], o entre la frula o dispositivo ortopdico y Copy. Aplique el hielo durante 20 minutos, 2 o 3 veces por da. No se quede dormido con la bolsa de hielo [employer]. Retire el hielo si la piel se pone de color rojo brillante. Esto es Intel. Si no puede sentir dolor, calor o fro, tiene un mayor riesgo de que se dae la zona. Mueva los dedos con frecuencia para reducir la rigidez y la hinchazn. Instrucciones generales Use los medicamentos de venta libre y los recetados solamente como se lo haya indicado el mdico. Descanse la [place] y la mano de toda actividad que le cause dolor. Si la afeccin tiene relacin con Kathie Dike, hable con su empleador [employer] pueden [place], por [place], usar una almohadilla para apoyar la mueca mientras tipea. Haga los ejercicios como se lo hayan indicado el mdico, el fisioterapeuta o el terapeuta ocupacional. Cumpla con todas las visitas de  seguimiento. Esto es importante. Comunquese con un mdico si: Aparecen nuevos sntomas. El dolor no se alivia con los [employer]. Sus sntomas empeoran. Solicite ayuda de inmediato si: Tiene hormigueo o adormecimiento intensos en la mueca o la mano. Resumen El sndrome del tnel carpiano es una afeccin que causa dolor, adormecimiento y debilidad en la mano y los dedos. Generalmente se debe a movimientos repetidos de [employer]. El sndrome del tnel carpiano se trata mediante cambios en el estilo de vida y medicamentos. Tambin puede indicarse la [place]. Siga las instrucciones del mdico sobre el uso de una frula, el descanso de la [place], la asistencia a las visitas de seguimiento y Freight forwarder para pedir ayuda. Esta informacin no tiene Theme park manager el consejo del mdico. Asegrese de hacerle al mdico cualquier pregunta que tenga. Document Revised: 04/11/2020 Document Reviewed: 04/11/2020 Elsevier Patient Education  2024 [employer].

## 2023-10-18 NOTE — Telephone Encounter (Signed)
Initial Comment Caller states he wanted to make an appointment. arms going numb during the day and at night. Translation Yes Translation Language Spanish Nurse Assessment Nurse: Nicholaus Bloom, RN, Judeth Cornfield Date/Time Samuel [name] Time): 10/17/2023 4:24:27 PM Confirm and document reason for call. If symptomatic, describe symptoms. ---spanish interpreter used [phone]- caller reports that numbness in arms at night and sometimes during the days, worse at night, not currently happening, 3 weeks Does the patient have any new or worsening symptoms? ---Yes Will a triage be completed? ---Yes Related visit to physician within the last 2 weeks? ---No Does the PT have any chronic conditions? (i.e. diabetes, asthma, this includes High risk factors for pregnancy, etc.) ---Yes List chronic conditions. ---diabetic Is this a behavioral health or substance abuse call? ---No Guidelines Guideline Title Affirmed Question Affirmed Notes Nurse Date/Time (Eastern Time) Neurologic Deficit [1] Numbness or tingling on both sides of body AND [2] is a new symptom present > 24 hours Lowanda Foster 10/17/2023 4:27:18 PM Disp. Time Samuel [name] Time) Disposition Final User 10/17/2023 4:32:07 PM SEE PCP WITHIN 3 DAYS Yes Nicholaus Bloom RN, Judeth Cornfield PLEASE NOTE: All timestamps contained within this report are represented as [place] Standard Time. CONFIDENTIALTY NOTICE: This fax transmission is intended only for the addressee. It contains information that is legally privileged, confidential or otherwise protected from use or disclosure. If you are not the intended recipient, you are strictly prohibited from reviewing, disclosing, copying using or disseminating any of this information or taking any action in reliance on or regarding this information. If you have received this fax in error, please notify us immediately by telephone so that we can arrange for its return to [place]. Phone: [phone], Toll-Free: [phone], Fax:  [phone] Page: 2 of 2 Call Id: [id number] Final Disposition 10/17/2023 4:32:07 PM SEE PCP WITHIN 3 DAYS Yes Nicholaus Bloom, RN, Loura Halt Disagree/Comply Comply Caller Understands Yes PreDisposition Call Doctor Care Advice Given Per Guideline SEE PCP WITHIN 3 DAYS: * You need to be seen within 2 or 3 days. CALL BACK IF: * You become worse CARE ADVICE given per Neurologic Deficit (Adult) guideline. Comments User: Bing Matter, RN Date/Time Samuel [name] Time): 10/17/2023 4:33:52 PM pt already has an appointment for Tuesday 330 pm User: Bing Matter, RN Date/Time Samuel [name] Time): 10/17/2023 4:36:30 PM called schedule line and confirmed thatpt did in fact have an appointment tomorrow for 320pm Referrals Warm transfer to backline

## 2023-10-18 NOTE — Progress Notes (Signed)
   Subjective:    Patient ID: Samuel [name], male    DOB: 1951-01-12, 72 y.o.   MRN: [id number]  HPI Discussed the use of AI scribe software for clinical note transcription with the patient, who gave verbal consent to proceed.  History of Present Illness   The patient, with a history of hypertension managed with Losartan 50mg  daily, presents with intermittent numbness and tingling in his arms, predominantly on the left side. These symptoms, described as similar to the sensation of ants crawling, have been occurring for approximately three months, with an average frequency of two to three nights per week. The patient denies any associated neck pain but reports occasional stiffness and difficulty turning his neck.  In addition to the above, the patient has been experiencing frequent sneezing and a runny nose, described as watery discharge, for several months. He has been considering these symptoms as transient and has not sought any treatment.        Review of Systems     Objective:   Physical Exam  General Mental Status- Alert. General Appearance- Not in acute distress.   Skin General: Color- Normal Color. Moisture- Normal Moisture.  Neck No obvious c spine tenderness.  Negative spurlings test.  Chest and Lung Exam Auscultation: Breath Sounds:-Normal.  Cardiovascular Auscultation:Rythm- Regular. Murmurs & Other Heart Sounds:Auscultation of the heart reveals- No Murmurs.  Abdomen Inspection:-Inspeection Normal. Palpation/Percussion:Note:No mass. Palpation and Percussion of the abdomen reveal- Non Tender, Non Distended + BS, no rebound or guarding.    Neurologic Cranial Nerve exam:- CN III-XII intact(No nystagmus), symmetric smile. Strength:- 5/5 equal and symmetric strength both upper and lower extremities.   Upper ext- no tinnels or phalens sign on either side    Assessment & Plan:   Assessment and Plan    Carpal Tunnel Syndrome Intermittent numbness and tingling  in the hands, predominantly on the left, for the past three weeks. No history of neck pain or radiating arm pain. -Consider using wrist braces(wrist cock up splints) at night or during the day if symptoms worsen. -Take over-the-counter anti-inflammatory medication such as ibuprofen 200-400mg  every 8 hours or twice daily. -If symptoms worsen, consider referral to a hand specialist for nerve conduction studies and possible surgical intervention.  Allergic Rhinitis Recent onset of frequent sneezing and watery nasal discharge, possibly due to allergies. -Take Xyzal (antihistamine) at night. -Consider adding Flonase (nasal spray) if symptoms persist.  Hypertension Well controlled on Losartan 50mg  daily. -Continue Losartan 50mg  daily.      Follow up one month or sooner if needed  [employer], PA-C

## 2023-10-18 NOTE — Telephone Encounter (Signed)
Appt already scheduled today w/ Ramon Dredge.

## 2023-10-24 ENCOUNTER — Encounter: Payer: Self-pay | Admitting: Medical

## 2023-10-31 DIAGNOSIS — E1165 Type 2 diabetes mellitus with hyperglycemia: Secondary | ICD-10-CM | POA: Diagnosis not present

## 2023-10-31 NOTE — Progress Notes (Signed)
Surgical Instructions   Your procedure is scheduled on Tuesday November 15, 2023. Report to [facility] Main Entrance "A" at 11:30 A.M., then check in with the Admitting office. Any questions or running late day of surgery: call [phone]  Questions prior to your surgery date: call [phone], Monday-Friday, 8am-4pm. If you experience any cold or flu symptoms such as cough, fever, chills, shortness of breath, etc. between now and your scheduled surgery, please notify us at the above number.     Remember:  Do not eat after midnight the night before your surgery  You may drink clear liquids until 10:30 the morning of your surgery.   Clear liquids allowed are: Water, Non-Citrus Juices (without pulp), Carbonated Beverages, Clear Tea (no milk, honey, etc.), Black Coffee Only (NO MILK, CREAM OR POWDERED CREAMER of any kind), and Gatorade.    Take these medicines the morning of surgery with A SIP OF WATER  atorvastatin (LIPITOR)    May take these medicines IF NEEDED: fluticasone (FLONASE)   Follow your surgeon's instructions on when to stop Asprin.  If no instructions were given by your surgeon then you will need to call the office to get those instructions.    One week prior to surgery, STOP taking any Aspirin (unless otherwise instructed by your surgeon) Aleve, Naproxen, Ibuprofen, Motrin, Advil, Goody's, BC's, all herbal medications, fish oil, and non-prescription vitamins.   WHAT DO I DO ABOUT MY DIABETES MEDICATION?   Do not take oral diabetes medicines (pills) the morning of surgery.        DO NOT TAKE YOUR Dulaglutide (TRULICITY) 7 DAYS PRIOR TO SURGERY, WITH THE LAST DOSE BEING 11/07/2023.        DO NOT TAKE YOUR  Empagliflozin-metFORMIN HCl ER (SYNJARDY XR) 72 HOURS PRIOR TO SURGERY, WITH THE LAST DOSE BEING 11/11/2023.      REDUCE YOUR Insulin Pump (OMNIPOD DASH PODS, GEN 4,) BY 20% AT MIDNIGHT THE NIGHT BEFORE YOUR SURGERY.     The day of surgery, do not take other  diabetes injectables, including Byetta (exenatide), Bydureon (exenatide ER), Victoza (liraglutide), or Trulicity (dulaglutide).  If your CBG is greater than 220 mg/dL, you may take  of your sliding scale (correction) dose of insulin.   HOW TO MANAGE YOUR DIABETES BEFORE AND AFTER SURGERY  Why is it important to control my blood sugar before and after surgery? Improving blood sugar levels before and after surgery helps healing and can limit problems. A way of improving blood sugar control is eating a healthy diet by:  Eating less sugar and carbohydrates  Increasing activity/exercise  Talking with your doctor about reaching your blood sugar goals High blood sugars (greater than 180 mg/dL) can raise your risk of infections and slow your recovery, so you will need to focus on controlling your diabetes during the weeks before surgery. Make sure that the doctor who takes care of your diabetes knows about your planned surgery including the date and location.  How do I manage my blood sugar before surgery? Check your blood sugar at least 4 times a day, starting 2 days before surgery, to make sure that the level is not too high or low.  Check your blood sugar the morning of your surgery when you wake up and every 2 hours until you get to the Short Stay unit.  If your blood sugar is less than 70 mg/dL, you will need to treat for low blood sugar: Do not take insulin. Treat a low blood sugar (less than  70 mg/dL) with  cup of clear juice (cranberry or apple), 4 glucose tablets, OR glucose gel. Recheck blood sugar in 15 minutes after treatment (to make sure it is greater than 70 mg/dL). If your blood sugar is not greater than 70 mg/dL on recheck, call [phone] for further instructions. Report your blood sugar to the short stay nurse when you get to Short Stay.  If you are admitted to the hospital after surgery: Your blood sugar will be checked by the staff and you will probably be given insulin  after surgery (instead of oral diabetes medicines) to make sure you have good blood sugar levels. The goal for blood sugar control after surgery is 80-180 mg/dL.                      Do NOT Smoke (Tobacco/Vaping) for 24 hours prior to your procedure.  If you use a CPAP at night, you may bring your mask/headgear for your overnight stay.   You will be asked to remove any contacts, glasses, piercing's, hearing aid's, dentures/partials prior to surgery. Please bring cases for these items if needed.    Patients discharged the day of surgery will not be allowed to drive home, and someone needs to stay with them for 24 hours.  SURGICAL WAITING ROOM VISITATION Patients may have no more than 2 support people in the waiting area - these visitors may rotate.   Pre-op nurse will coordinate an appropriate time for 1 ADULT support person, who may not rotate, to accompany patient in pre-op.  Children under the age of 70 must have an adult with them who is not the patient and must remain in the main waiting area with an adult.  If the patient needs to stay at the hospital during part of their recovery, the visitor guidelines for inpatient rooms apply.  Please refer to the [facility] website for the visitor guidelines for any additional information.   If you received a COVID test during your pre-op visit  it is requested that you wear a mask when out in public, stay away from anyone that may not be feeling well and notify your surgeon if you develop symptoms. If you have been in contact with anyone that has tested positive in the last 10 days please notify you surgeon.      Pre-operative 5 CHG Bathing Instructions   You can play a key role in reducing the risk of infection after surgery. Your skin needs to be as free of germs as possible. You can reduce the number of germs on your skin by washing with CHG (chlorhexidine gluconate) soap before surgery. CHG is an antiseptic soap that kills germs and  continues to kill germs even after washing.   DO NOT use if you have an allergy to chlorhexidine/CHG or antibacterial soaps. If your skin becomes reddened or irritated, stop using the CHG and notify one of our RNs at [phone].   Please shower with the CHG soap starting 4 days before surgery using the following schedule:     Please keep in mind the following:  DO NOT shave, including legs and underarms, starting the day of your first shower.   You may shave your face at any point before/day of surgery.  Place clean sheets on your bed the day you start using CHG soap. Use a clean washcloth (not used since being washed) for each shower. DO NOT sleep with pets once you start using the CHG.   CHG  Shower Instructions:  Oncologist and private area with normal soap. If you choose to wash your hair, wash first with your normal shampoo.  After you use shampoo/soap, rinse your hair and body thoroughly to remove shampoo/soap residue.  Turn the water OFF and apply about 3 tablespoons (45 ml) of CHG soap to a CLEAN washcloth.  Apply CHG soap ONLY FROM YOUR NECK DOWN TO YOUR TOES (washing for 3-5 minutes)  DO NOT use CHG soap on face, private areas, open wounds, or sores.  Pay special attention to the area where your surgery is being performed.  If you are having back surgery, having someone wash your back for you may be helpful. Wait 2 minutes after CHG soap is applied, then you may rinse off the CHG soap.  Pat dry with a clean towel  Put on clean clothes/pajamas   If you choose to wear lotion, please use ONLY the CHG-compatible lotions listed below.    Additional instructions for the day of surgery: DO NOT APPLY any lotions, deodorants or cologne.    Do not bring valuables to the hospital. [facility] is not responsible for any belongings/valuables. Do not wear jewelry  Put on clean/comfortable clothes.  Please brush your teeth.  Ask your nurse before applying any prescription  medications to the skin.     CHG Compatible Lotions   Aveeno Moisturizing lotion  Cetaphil Moisturizing Cream  Cetaphil Moisturizing Lotion  Clairol Herbal Essence Moisturizing Lotion, Dry Skin  Clairol Herbal Essence Moisturizing Lotion, Extra Dry Skin  Clairol Herbal Essence Moisturizing Lotion, Normal Skin  Curel Age Defying Therapeutic Moisturizing Lotion with Alpha Hydroxy  Curel Extreme Care Body Lotion  Curel Soothing Hands Moisturizing Hand Lotion  Curel Therapeutic Moisturizing Cream, Fragrance-Free  Curel Therapeutic Moisturizing Lotion, Fragrance-Free  Curel Therapeutic Moisturizing Lotion, Original Formula  Eucerin Daily Replenishing Lotion  Eucerin Dry Skin Therapy Plus Alpha Hydroxy Crme  Eucerin Dry Skin Therapy Plus Alpha Hydroxy Lotion  Eucerin Original Crme  Eucerin Original Lotion  Eucerin Plus Crme Eucerin Plus Lotion  Eucerin TriLipid Replenishing Lotion  Keri Anti-Bacterial Hand Lotion  Keri Deep Conditioning Original Lotion Dry Skin Formula Softly Scented  Keri Deep Conditioning Original Lotion, Fragrance Free Sensitive Skin Formula  Keri Lotion Fast Absorbing Fragrance Free Sensitive Skin Formula  Keri Lotion Fast Absorbing Softly Scented Dry Skin Formula  Keri Original Lotion  Keri Skin Renewal Lotion Keri Silky Smooth Lotion  Keri Silky Smooth Sensitive Skin Lotion  Nivea Body Creamy Conditioning Oil  Nivea Body Extra Enriched Lotion  Nivea Body Original Lotion  Nivea Body Sheer Moisturizing Lotion Nivea Crme  Nivea Skin Firming Lotion  NutraDerm 30 Skin Lotion  NutraDerm Skin Lotion  NutraDerm Therapeutic Skin Cream  NutraDerm Therapeutic Skin Lotion  ProShield Protective Hand Cream  Provon moisturizing lotion  Please read over the following fact sheets that you were given.

## 2023-11-03 ENCOUNTER — Encounter (HOSPITAL_COMMUNITY): Payer: Self-pay

## 2023-11-03 ENCOUNTER — Other Ambulatory Visit: Payer: Self-pay

## 2023-11-03 ENCOUNTER — Encounter (HOSPITAL_COMMUNITY)
Admission: RE | Admit: 2023-11-03 | Discharge: 2023-11-03 | Disposition: A | Discharge status: Home / Self Care | Payer: Medicare Other | Source: Ambulatory Visit | Attending: Orthopaedic Surgery | Admitting: Orthopaedic Surgery

## 2023-11-03 VITALS — BP 131/71 | HR 62 | Temp 98.3°F | Resp 18 | Ht 64.0 in | Wt 201.4 lb

## 2023-11-03 DIAGNOSIS — I10 Essential (primary) hypertension: Secondary | ICD-10-CM | POA: Diagnosis not present

## 2023-11-03 DIAGNOSIS — M1711 Unilateral primary osteoarthritis, right knee: Secondary | ICD-10-CM | POA: Insufficient documentation

## 2023-11-03 DIAGNOSIS — Z96652 Presence of left artificial knee joint: Secondary | ICD-10-CM | POA: Diagnosis not present

## 2023-11-03 DIAGNOSIS — Z87891 Personal history of nicotine dependence: Secondary | ICD-10-CM | POA: Diagnosis not present

## 2023-11-03 DIAGNOSIS — Z01812 Encounter for preprocedural laboratory examination: Secondary | ICD-10-CM | POA: Diagnosis not present

## 2023-11-03 DIAGNOSIS — E119 Type 2 diabetes mellitus without complications: Secondary | ICD-10-CM | POA: Insufficient documentation

## 2023-11-03 DIAGNOSIS — Z9641 Presence of insulin pump (external) (internal): Secondary | ICD-10-CM | POA: Diagnosis not present

## 2023-11-03 DIAGNOSIS — Z981 Arthrodesis status: Secondary | ICD-10-CM | POA: Diagnosis not present

## 2023-11-03 DIAGNOSIS — Z794 Long term (current) use of insulin: Secondary | ICD-10-CM | POA: Insufficient documentation

## 2023-11-03 DIAGNOSIS — Z01818 Encounter for other preprocedural examination: Secondary | ICD-10-CM | POA: Diagnosis present

## 2023-11-03 LAB — CBC
HCT: 46.9 % (ref 39.0–52.0)
Hemoglobin: 15.8 g/dL (ref 13.0–17.0)
MCH: 30.3 pg (ref 26.0–34.0)
MCHC: 33.7 g/dL (ref 30.0–36.0)
MCV: 90 fL (ref 80.0–100.0)
Platelets: 176 10*3/uL (ref 150–400)
RBC: 5.21 MIL/uL (ref 4.22–5.81)
RDW: 13.3 % (ref 11.5–15.5)
WBC: 6.5 10*3/uL (ref 4.0–10.5)
nRBC: 0 % (ref 0.0–0.2)

## 2023-11-03 LAB — BASIC METABOLIC PANEL
Anion gap: 8 (ref 5–15)
BUN: 11 mg/dL (ref 8–23)
CO2: 28 mmol/L (ref 22–32)
Calcium: 9.5 mg/dL (ref 8.9–10.3)
Chloride: 102 mmol/L (ref 98–111)
Creatinine, Ser: 0.73 mg/dL (ref 0.61–1.24)
GFR, Estimated: >60 mL/min (ref >60)
Glucose, Bld: 99 mg/dL (ref 70–99)
Potassium: 4.2 mmol/L (ref 3.5–5.1)
Sodium: 138 mmol/L (ref 135–145)

## 2023-11-03 LAB — NO BLOOD PRODUCTS

## 2023-11-03 LAB — GLUCOSE, CAPILLARY: Glucose-Capillary: 119 mg/dL — ABNORMAL HIGH (ref 70–99)

## 2023-11-03 LAB — SURGICAL PCR SCREEN
MRSA, PCR: NEGATIVE
Staphylococcus aureus: NEGATIVE

## 2023-11-03 NOTE — Progress Notes (Signed)
PCP - Esperanza Richters, PA-C Endocrinologist: Dr. [place] Thapa Cardiologist -   PPM/ICD - denies Device Orders - na Rep Notified - na  Chest x-ray - na EKG - 11/26/2022 Stress Test -  ECHO -  Cardiac Cath -   Sleep Study - denies CPAP - na  Type II diabetic. Blood sugar 119  in PAT. CGM to left arm, Freestyle Libre 3 Fasting Blood Sugar - 98-160 Checks Blood Sugar: continuous  Reduce omnipod by 20% at midnight before surgery  Last dose of GLP1 agonist-  Trulicity, last dose 11/07/2023.   Synjardy XR, last dose 11/10/2022  Blood Thinner Instructions: denies Aspirin Instructions:Will get directions from Dr. Eliberto Ivory office  ERAS Protcol -G2 until 1000  COVID TEST- na  Anesthesia review: Yes. HTN, DM, has insulin pump  Patient denies shortness of breath, fever, cough and chest pain at PAT appointment   All instructions explained to the patient, with a verbal understanding of the material. Patient agrees to go over the instructions while at home for a better understanding. Patient also instructed to self quarantine after being tested for COVID-19. The opportunity to ask questions was provided.

## 2023-11-04 NOTE — Anesthesia Preprocedure Evaluation (Signed)
Anesthesia Evaluation    Airway        Dental   Pulmonary former smoker          Cardiovascular hypertension,      Neuro/Psych    GI/Hepatic   Endo/Other  diabetes    Renal/GU      Musculoskeletal   Abdominal   Peds  Hematology   Anesthesia Other Findings   Reproductive/Obstetrics                             Anesthesia Physical Anesthesia Plan  ASA:   Anesthesia Plan:    Post-op Pain Management:    Induction:   PONV Risk Score and Plan:   Airway Management Planned:   Additional Equipment:   Intra-op Plan:   Post-operative Plan:   Informed Consent:   Plan Discussed with:   Anesthesia Plan Comments: (PAT note written 11/04/2023 by Shonna Chock, PA-C.  )       Anesthesia Quick Evaluation

## 2023-11-04 NOTE — Progress Notes (Addendum)
Anesthesia Chart Review:  Case: [id number] Date/Time: 11/15/23 1246   Procedure: RIGHT TOTAL KNEE ARTHROPLASTY (Right: Knee)   Anesthesia type: Choice   Pre-op diagnosis: Right Knee Osteoarthritis   [facility]: [facility] ROOM 07 / [facility] OR   Surgeons: Kathryne Hitch, MD       DISCUSSION: It is a 72 year old male scheduled for the above procedure.  History includes former smoker (quit 11/08/93), HTN, IDDM2 (with insulin pump), prior alcohol abuse, spinal surgery (L3-L4 fusion following "traumatic dislocation"; L2-3 decompression/arthrodesis 07/24/18), osteoarthritis (left TKA 08/07/21). No current alcohol use is documented.  A1c 6.9% on 10/10/23. He is followed by Dr. Erroll Luna. He wears a Freestyle Libre 3 glucose monitor. He in on Humalog 100 unit/mL via Omnipod, Trulicity (last dose 11/07/23), Synjardy XR (last dose 11/10/22).   EKG appears stable. He denied chest pain and SOB at PAT RN visit.   He signed a refusal of blood and blood products. H/H 15.8/46.9, PLT 176K on 11/03/23.  Anesthesia team to evaluate on the day of surgery. He is Spanish speaking.    VS: BP 131/71   Pulse 62   Temp 36.8 C (Oral)   Resp 18   Ht 5\' 4"  (1.626 m)   Wt 91.4 kg   SpO2 98%   BMI 34.57 kg/m    PROVIDERS: Saguier, Kateri Mc is PCP  Thapa, [place], MD is endocrinologist   LABS: Labs reviewed: Acceptable for surgery. A1c 6.9% on 10/10/23.  (all labs ordered are listed, but only abnormal results are displayed)  Labs Reviewed  GLUCOSE, CAPILLARY - Abnormal; Notable for the following components:      Result Value   Glucose-Capillary 119 (*)    All other components within normal limits  SURGICAL PCR SCREEN  CBC  BASIC METABOLIC PANEL  NO BLOOD PRODUCTS    IMAGES: Xray right knee 06/13/23: Right knee 2 views: No acute fracture knee is well located.  Mild patellofemoral and moderate to severe medial compartmental narrowing.  Mild lateral compartmental changes.  Otherwise no bony lesions or  abnormalities.    EKG: EKG 11/26/22: Sinus Rhythm  -Nonspecific QRS widening and left anterior fascicular block. ABNORMAL - Overall, EKG is similar when compared to 07/28/21 tracing.   CV: N/A  Past Medical History:  Diagnosis Date   Arthritis    Blood transfusion without reported diagnosis    Diabetes mellitus without complication (HCC)    type II   History of ETOH abuse    Hypertension     Past Surgical History:  Procedure Laterality Date   BACK SURGERY N/A 2010   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   Procedure: LEFT TOTAL KNEE ARTHROPLASTY;  Surgeon: Kathryne Hitch, MD;  Location: [facility] ORS;  Service: Orthopedics;  Laterality: Left;    MEDICATIONS:  aspirin 81 MG chewable tablet   atorvastatin (LIPITOR) 40 MG tablet   B-D UF III MINI PEN NEEDLES 31G X 5 MM MISC   Continuous Glucose Sensor (FREESTYLE LIBRE 2 SENSOR) MISC   Dulaglutide (TRULICITY) 4.5 MG/0.5ML SOPN   Empagliflozin-metFORMIN HCl ER (SYNJARDY XR) 03-999 MG TB24   fluticasone (FLONASE) 50 MCG/ACT nasal spray   Glucagon (GVOKE HYPOPEN 1-PACK) 1 MG/0.2ML SOAJ   glucose blood (FREESTYLE LITE) test strip   Insulin Disposable Pump (OMNIPOD DASH INTRO, GEN 4,) KIT   Insulin Disposable Pump (OMNIPOD DASH PODS, GEN 4,) MISC   insulin lispro (HUMALOG) 100 UNIT/ML injection   Insulin Pen Needle 32G X 4 MM MISC   Insulin Syringe-Needle U-100 (INSULIN SYRINGE .  5CC/30GX5/16") 30G X 5/16" 0.5 ML MISC   levocetirizine (XYZAL) 5 MG tablet   losartan (COZAAR) 50 MG tablet   OVER THE COUNTER MEDICATION   No current facility-administered medications for this encounter.    Shonna Chock, PA-C Surgical Short Stay/Anesthesiology [facility] Phone [phone] [facility] Phone [phone] 11/04/2023 4:12 PM

## 2023-11-10 ENCOUNTER — Other Ambulatory Visit: Payer: Self-pay

## 2023-11-11 ENCOUNTER — Encounter: Payer: Self-pay | Admitting: Endocrinology

## 2023-11-11 ENCOUNTER — Encounter: Payer: Self-pay | Admitting: Internal Medicine

## 2023-11-11 DIAGNOSIS — E1165 Type 2 diabetes mellitus with hyperglycemia: Secondary | ICD-10-CM

## 2023-11-14 ENCOUNTER — Telehealth: Payer: Self-pay | Admitting: *Deleted

## 2023-11-14 ENCOUNTER — Other Ambulatory Visit: Payer: Self-pay

## 2023-11-14 DIAGNOSIS — E1165 Type 2 diabetes mellitus with hyperglycemia: Secondary | ICD-10-CM

## 2023-11-14 DIAGNOSIS — M1711 Unilateral primary osteoarthritis, right knee: Secondary | ICD-10-CM | POA: Insufficient documentation

## 2023-11-14 MED ORDER — FREESTYLE LITE TEST VI STRP: ORAL_STRIP | 2 refills | Status: AC

## 2023-11-14 NOTE — Telephone Encounter (Signed)
 Ortho bundle pre-op call completed.

## 2023-11-14 NOTE — H&P (Signed)
 TOTAL KNEE ADMISSION H&P  Patient is being admitted for right total knee arthroplasty.  Subjective:  Chief Complaint:right knee pain.  HPI: Samuel [name], 73 y.o. male, has a history of pain and functional disability in the right knee due to arthritis and has failed non-surgical conservative treatments for greater than 12 weeks to includeNSAID's and/or analgesics, corticosteriod injections, viscosupplementation injections, flexibility and strengthening excercises, use of assistive devices, weight reduction as appropriate, and activity modification.  Onset of symptoms was gradual, starting 3 years ago with gradually worsening course since that time. The patient noted no past surgery on the right knee(s).  Patient currently rates pain in the right knee(s) at 10 out of 10 with activity. Patient has night pain, worsening of pain with activity and weight bearing, pain that interferes with activities of daily living, pain with passive range of motion, crepitus, and joint swelling.  Patient has evidence of subchondral sclerosis, periarticular osteophytes, and joint space narrowing by imaging studies. There is no active infection.  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) 05/01/2015   Cough 01/02/2015   Diabetes (HCC) 10/02/2014   Essential hypertension, benign 06/17/2014   Type II or unspecified type diabetes mellitus without mention of complication, not stated as uncontrolled 06/17/2014   Hyperlipidemia 06/17/2014   Hip pain 06/17/2014   Back pain 06/17/2014   Frequent urination 06/17/2014   Asthma 06/17/2014   Asthma 06/17/2014   Diabetes  mellitus type 2, uncontrolled 01/26/2012   Past Medical History:  Diagnosis Date   Arthritis    Blood transfusion without reported diagnosis    Diabetes mellitus without complication (HCC)    type II   History of ETOH abuse    Hypertension     Past Surgical History:  Procedure Laterality Date   BACK SURGERY N/A 2010   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   Procedure: LEFT TOTAL KNEE ARTHROPLASTY;  Surgeon: Vernetta Lonni GRADE, MD;  Location: [facility] ORS;  Service: Orthopedics;  Laterality: Left;    No current facility-administered medications for this encounter.   Current Outpatient Medications  Medication Sig Dispense Refill Last Dose/Taking   aspirin  81 MG chewable tablet Chew 1 tablet (81 mg total) by mouth 2 (two) times daily. (Patient taking differently: Chew 81 mg by mouth daily.) 60 tablet 0 Taking Differently   atorvastatin  (LIPITOR) 40 MG tablet TAKE 1 TABLET BY MOUTH EVERY MORNING AND 1/2 AT BEDTIME 135 tablet 1 Taking   Dulaglutide  (TRULICITY ) 4.5 MG/0.5ML SOPN Inject 4.5 mg as directed once a week. 6 mL 3 Taking   Empagliflozin -metFORMIN  HCl ER (SYNJARDY  XR) 03-999 MG TB24 Take 2 tablets by mouth daily. 180 tablet 3 Taking   fluticasone  (FLONASE ) 50 MCG/ACT nasal spray Place 2 sprays into both nostrils daily. (Patient taking differently: Place 2 sprays into both nostrils daily as needed for allergies.) 16 g 1 Taking Differently   Glucagon  (GVOKE HYPOPEN  1-PACK) 1 MG/0.2ML SOAJ Inject 1 mg into the skin as needed (low blood sugar with impaired consciousness). 0.4 mL 2 Taking As Needed   insulin  lispro (HUMALOG ) 100 UNIT/ML injection Max daily 100 units per pump 100 mL 3 Taking   levocetirizine (XYZAL )  5 MG tablet Take 1 tablet (5 mg total) by mouth every evening. 30 tablet 3 Taking   losartan  (COZAAR ) 50 MG tablet TAKE 1 TABLET(50 MG) BY MOUTH DAILY 90 tablet 1 Taking   OVER THE COUNTER MEDICATION Take 2 tablets by mouth daily. 4LIFE TRANSFER FACTORY RECALL   Taking   B-D  UF III MINI PEN NEEDLES 31G X 5 MM MISC USE FIVE PER DAY AS DIRECTED 200 each 0    Continuous Glucose Sensor (FREESTYLE LIBRE 2 SENSOR) MISC 2 Devices by Does not apply route every 14 (fourteen) days. 2 each 3    glucose blood (FREESTYLE LITE) test strip USE AS INSTRUCTED FOUR TIMES DAILY 150 strip 2    Insulin  Disposable Pump (OMNIPOD DASH INTRO, GEN 4,) KIT Change every 3 days 1 kit 0    Insulin  Disposable Pump (OMNIPOD DASH PODS, GEN 4,) MISC USE FOR INSULIN  PUMP EVEY 3 DAYS 30 each 5    Insulin  Pen Needle 32G X 4 MM MISC Use 5 pens per day to inject insulin  200 each 3    Insulin  Syringe-Needle U-100 (INSULIN  SYRINGE .5CC/30GX5/16) 30G X 5/16 0.5 ML MISC Use 3 times a day for insulin  300 each 1    No Known Allergies  Social History   Tobacco Use   Smoking status: Former   Smokeless tobacco: Former    Quit date: 11/08/1993  Substance Use Topics   Alcohol use: No    Family History  Problem Relation Age of Onset   Diabetes Unknown    Hypertension Unknown    Arthritis Father    Diabetes Father      Review of Systems  Objective:  Physical Exam Vitals reviewed.  Constitutional:      Appearance: Normal appearance.  HENT:     Head: Normocephalic and atraumatic.  Eyes:     Extraocular Movements: Extraocular movements intact.     Pupils: Pupils are equal, round, and reactive to light.  Cardiovascular:     Rate and Rhythm: Normal rate.     Pulses: Normal pulses.  Pulmonary:     Effort: Pulmonary effort is normal.     Breath sounds: Normal breath sounds.  Abdominal:     Palpations: Abdomen is soft.  Musculoskeletal:     Cervical back: Normal range of motion and neck supple.     Right knee: Effusion, bony tenderness and crepitus present. Decreased range of motion. Tenderness present over the medial joint line, lateral joint line and patellar tendon. Abnormal alignment.  Neurological:     Mental Status: He is alert and oriented to person, place, and time. Mental status is at  baseline.  Psychiatric:        Behavior: Behavior normal.     Vital signs in last 24 hours:    Labs:   Estimated body mass index is 34.57 kg/m as calculated from the following:   Height as of 11/03/23: 5' 4 (1.626 m).   Weight as of 11/03/23: 91.4 kg.   Imaging Review Plain radiographs demonstrate severe degenerative joint disease of the right knee(s). The overall alignment ismild varus. The bone quality appears to be excellent for age and reported activity level.      Assessment/Plan:  End stage arthritis, right knee   The patient history, physical examination, clinical judgment of the provider and imaging studies are consistent with end stage degenerative joint disease of the right knee(s) and total knee arthroplasty is deemed medically necessary. The treatment options including medical management, injection therapy arthroscopy  and arthroplasty were discussed at length. The risks and benefits of total knee arthroplasty were presented and reviewed. The risks due to aseptic loosening, infection, stiffness, patella tracking problems, thromboembolic complications and other imponderables were discussed. The patient acknowledged the explanation, agreed to proceed with the plan and consent was signed. Patient is being admitted for inpatient treatment for surgery, pain control, PT, OT, prophylactic antibiotics, VTE prophylaxis, progressive ambulation and ADL's and discharge planning. The patient is planning to be discharged home with home health services

## 2023-11-14 NOTE — Progress Notes (Addendum)
 Spoke to pt's daughter Lafonda Mosses, pt will arrive tom at 1000, NPO post midnight and will stop clear liquids at 0900.  I tried to reschedule the spanish interpreter but no one is scheduled to come for this pt.

## 2023-11-14 NOTE — Care Plan (Signed)
 [facility] RNCM call to patient and spoke with him and his daughter via telephone to discuss his upcoming Right total knee arthroplasty with Dr. Vernetta on 11/15/23 at [facility]. His daughter Doyal has flown in to stay with him for next 2 weeks. He is agreeable to case management. His plan is to return home with assistance from his daughters. He has a RW, cane and previous ice machine from other knee replacement. Anticipate HHPT will be needed after short hospital stay. Referral made to [facility] after choice provided. Reviewed all post op care instructions. Will continue to follow for needs.

## 2023-11-14 NOTE — Telephone Encounter (Signed)
 I recommend to decrease basal rate to 50% starting at midnight until start eating after the surgery.  Do not take other diabetes medicines on the day of surgery.  Does patient know how to set temporary basal rate?  Kwane Rohl, MD [facility] [address] [place], [address] [place], [place] [address] Phone # [phone]

## 2023-11-15 ENCOUNTER — Other Ambulatory Visit: Payer: Self-pay

## 2023-11-15 ENCOUNTER — Encounter (HOSPITAL_COMMUNITY)
Admission: RE | Disposition: A | Discharge status: Home Health | Payer: Self-pay | Source: Home / Self Care | Attending: Orthopaedic Surgery

## 2023-11-15 ENCOUNTER — Ambulatory Visit (HOSPITAL_COMMUNITY): Payer: HMO | Admitting: Vascular Surgery

## 2023-11-15 ENCOUNTER — Observation Stay (HOSPITAL_COMMUNITY)
Admission: RE | Admit: 2023-11-15 | Discharge: 2023-11-16 | Disposition: A | Discharge status: Home Health | Payer: HMO | Attending: Orthopaedic Surgery | Admitting: Orthopaedic Surgery

## 2023-11-15 ENCOUNTER — Ambulatory Visit (HOSPITAL_BASED_OUTPATIENT_CLINIC_OR_DEPARTMENT_OTHER): Payer: Self-pay | Admitting: Anesthesiology

## 2023-11-15 ENCOUNTER — Observation Stay (HOSPITAL_COMMUNITY): Payer: HMO

## 2023-11-15 ENCOUNTER — Encounter (HOSPITAL_COMMUNITY): Payer: Self-pay | Admitting: Orthopaedic Surgery

## 2023-11-15 DIAGNOSIS — Z87891 Personal history of nicotine dependence: Secondary | ICD-10-CM

## 2023-11-15 DIAGNOSIS — Z7982 Long term (current) use of aspirin: Secondary | ICD-10-CM | POA: Diagnosis not present

## 2023-11-15 DIAGNOSIS — I10 Essential (primary) hypertension: Secondary | ICD-10-CM

## 2023-11-15 DIAGNOSIS — Z7985 Long-term (current) use of injectable non-insulin antidiabetic drugs: Secondary | ICD-10-CM | POA: Insufficient documentation

## 2023-11-15 DIAGNOSIS — Z96651 Presence of right artificial knee joint: Secondary | ICD-10-CM | POA: Diagnosis not present

## 2023-11-15 DIAGNOSIS — E119 Type 2 diabetes mellitus without complications: Secondary | ICD-10-CM

## 2023-11-15 DIAGNOSIS — Z471 Aftercare following joint replacement surgery: Secondary | ICD-10-CM | POA: Diagnosis not present

## 2023-11-15 DIAGNOSIS — Z79899 Other long term (current) drug therapy: Secondary | ICD-10-CM | POA: Insufficient documentation

## 2023-11-15 DIAGNOSIS — M1711 Unilateral primary osteoarthritis, right knee: Secondary | ICD-10-CM | POA: Diagnosis not present

## 2023-11-15 DIAGNOSIS — Z794 Long term (current) use of insulin: Secondary | ICD-10-CM | POA: Insufficient documentation

## 2023-11-15 DIAGNOSIS — Z01818 Encounter for other preprocedural examination: Secondary | ICD-10-CM

## 2023-11-15 HISTORY — PX: TOTAL KNEE ARTHROPLASTY: SHX125

## 2023-11-15 LAB — GLUCOSE, CAPILLARY
Glucose-Capillary: 164 mg/dL — ABNORMAL HIGH (ref 70–99)
Glucose-Capillary: 136 mg/dL — ABNORMAL HIGH (ref 70–99)
Glucose-Capillary: 149 mg/dL — ABNORMAL HIGH (ref 70–99)
Glucose-Capillary: 176 mg/dL — ABNORMAL HIGH (ref 70–99)
Glucose-Capillary: 193 mg/dL — ABNORMAL HIGH (ref 70–99)

## 2023-11-15 SURGERY — ARTHROPLASTY, KNEE, TOTAL
Anesthesia: Regional | Site: Knee | Laterality: Right

## 2023-11-15 MED ORDER — ACETAMINOPHEN 325 MG PO TABS
325.0000 mg | ORAL_TABLET | Freq: Four times a day (QID) | ORAL | Status: DC | PRN
Start: 2023-11-16 — End: 2023-11-16

## 2023-11-15 MED ORDER — BUPIVACAINE-EPINEPHRINE 0.25% -1:200000 IJ SOLN
INTRAMUSCULAR | Status: DC | PRN
  Administered 2023-11-15: 30 mL

## 2023-11-15 MED ORDER — ASPIRIN 81 MG PO CHEW
81.0000 mg | CHEWABLE_TABLET | Freq: Two times a day (BID) | ORAL | Status: DC
  Administered 2023-11-15 – 2023-11-16 (×2): 81 mg via ORAL
  Filled 2023-11-15 (×2): qty 1

## 2023-11-15 MED ORDER — INSULIN ASPART 100 UNIT/ML IJ SOLN: 0.0000 [IU] | INTRAMUSCULAR | Status: DC | PRN

## 2023-11-15 MED ORDER — TRANEXAMIC ACID-NACL 1000-0.7 MG/100ML-% IV SOLN
1000.0000 mg | INTRAVENOUS | Status: AC
  Administered 2023-11-15: 1000 mg via INTRAVENOUS

## 2023-11-15 MED ORDER — PHENOL 1.4 % MT LIQD: 1.0000 | OROMUCOSAL | Status: DC | PRN

## 2023-11-15 MED ORDER — ACETAMINOPHEN 10 MG/ML IV SOLN
1000.0000 mg | Freq: Once | INTRAVENOUS | Status: DC | PRN
  Administered 2023-11-15: 1000 mg via INTRAVENOUS

## 2023-11-15 MED ORDER — PANTOPRAZOLE SODIUM 40 MG PO TBEC
40.0000 mg | DELAYED_RELEASE_TABLET | Freq: Every day | ORAL | Status: DC
  Administered 2023-11-15 – 2023-11-16 (×2): 40 mg via ORAL
  Filled 2023-11-15 (×2): qty 1

## 2023-11-15 MED ORDER — FENTANYL CITRATE (PF) 100 MCG/2ML IJ SOLN
INTRAMUSCULAR | Status: AC
  Filled 2023-11-15: qty 2

## 2023-11-15 MED ORDER — ONDANSETRON HCL 4 MG/2ML IJ SOLN
INTRAMUSCULAR | Status: DC | PRN
  Administered 2023-11-15: 4 mg via INTRAVENOUS

## 2023-11-15 MED ORDER — DOCUSATE SODIUM 100 MG PO CAPS
100.0000 mg | ORAL_CAPSULE | Freq: Two times a day (BID) | ORAL | Status: DC
  Administered 2023-11-15 – 2023-11-16 (×2): 100 mg via ORAL
  Filled 2023-11-15 (×2): qty 1

## 2023-11-15 MED ORDER — EMPAGLIFLOZIN 10 MG PO TABS
10.0000 mg | ORAL_TABLET | Freq: Every day | ORAL | Status: DC
Start: 2023-11-16 — End: 2023-11-16
  Administered 2023-11-16: 10 mg via ORAL
  Filled 2023-11-15: qty 1

## 2023-11-15 MED ORDER — OXYCODONE HCL 5 MG PO TABS
5.0000 mg | ORAL_TABLET | ORAL | Status: DC | PRN
  Administered 2023-11-15: 5 mg via ORAL
  Filled 2023-11-15: qty 2

## 2023-11-15 MED ORDER — FENTANYL CITRATE (PF) 100 MCG/2ML IJ SOLN: 50.0000 ug | Freq: Once | INTRAMUSCULAR | Status: DC

## 2023-11-15 MED ORDER — LACTATED RINGERS IV SOLN: INTRAVENOUS | Status: AC

## 2023-11-15 MED ORDER — FENTANYL CITRATE (PF) 100 MCG/2ML IJ SOLN
INTRAMUSCULAR | Status: AC
  Administered 2023-11-15: 50 ug
  Filled 2023-11-15: qty 2

## 2023-11-15 MED ORDER — HYDROMORPHONE HCL 1 MG/ML IJ SOLN
INTRAMUSCULAR | Status: AC
  Filled 2023-11-15: qty 1

## 2023-11-15 MED ORDER — PHENYLEPHRINE 80 MCG/ML (10ML) SYRINGE FOR IV PUSH (FOR BLOOD PRESSURE SUPPORT)
PREFILLED_SYRINGE | INTRAVENOUS | Status: DC | PRN
  Administered 2023-11-15: 80 ug via INTRAVENOUS

## 2023-11-15 MED ORDER — ONDANSETRON HCL 4 MG/2ML IJ SOLN: 4.0000 mg | Freq: Once | INTRAMUSCULAR | Status: DC | PRN

## 2023-11-15 MED ORDER — MIDAZOLAM HCL 2 MG/2ML IJ SOLN
INTRAMUSCULAR | Status: AC
  Filled 2023-11-15: qty 2

## 2023-11-15 MED ORDER — INSULIN LISPRO 100 UNIT/ML IJ SOLN: INTRAMUSCULAR | 3 refills | Status: AC

## 2023-11-15 MED ORDER — METHOCARBAMOL 1000 MG/10ML IJ SOLN: 500.0000 mg | Freq: Four times a day (QID) | INTRAMUSCULAR | Status: DC | PRN

## 2023-11-15 MED ORDER — CEFAZOLIN SODIUM-DEXTROSE 2-4 GM/100ML-% IV SOLN
2.0000 g | Freq: Four times a day (QID) | INTRAVENOUS | Status: AC
  Administered 2023-11-15 – 2023-11-16 (×2): 2 g via INTRAVENOUS
  Filled 2023-11-15 (×2): qty 100

## 2023-11-15 MED ORDER — ORAL CARE MOUTH RINSE: 15.0000 mL | Freq: Once | OROMUCOSAL | Status: AC

## 2023-11-15 MED ORDER — METFORMIN HCL ER 500 MG PO TB24
2000.0000 mg | ORAL_TABLET | Freq: Every day | ORAL | Status: DC
  Administered 2023-11-16: 2000 mg via ORAL
  Filled 2023-11-15: qty 4

## 2023-11-15 MED ORDER — CEFAZOLIN SODIUM-DEXTROSE 2-4 GM/100ML-% IV SOLN
2.0000 g | INTRAVENOUS | Status: AC
  Administered 2023-11-15: 2 g via INTRAVENOUS

## 2023-11-15 MED ORDER — ONDANSETRON HCL 4 MG PO TABS
4.0000 mg | ORAL_TABLET | Freq: Four times a day (QID) | ORAL | Status: DC | PRN
  Filled 2023-11-15: qty 1

## 2023-11-15 MED ORDER — ONDANSETRON HCL 4 MG/2ML IJ SOLN: 4.0000 mg | Freq: Four times a day (QID) | INTRAMUSCULAR | Status: DC | PRN

## 2023-11-15 MED ORDER — DEXMEDETOMIDINE HCL IN NACL 200 MCG/50ML IV SOLN
INTRAVENOUS | Status: DC | PRN
  Administered 2023-11-15: 12 ug via INTRAVENOUS

## 2023-11-15 MED ORDER — EMPAGLIFLOZIN-METFORMIN HCL ER 5-1000 MG PO TB24: 2.0000 | ORAL_TABLET | Freq: Every day | ORAL | Status: DC

## 2023-11-15 MED ORDER — METHOCARBAMOL 500 MG PO TABS: 500.0000 mg | ORAL_TABLET | Freq: Four times a day (QID) | ORAL | Status: DC | PRN

## 2023-11-15 MED ORDER — CHLORHEXIDINE GLUCONATE 0.12 % MT SOLN
OROMUCOSAL | Status: AC
  Administered 2023-11-15: 15 mL via OROMUCOSAL
  Filled 2023-11-15: qty 15

## 2023-11-15 MED ORDER — BUPIVACAINE-EPINEPHRINE (PF) 0.25% -1:200000 IJ SOLN
INTRAMUSCULAR | Status: AC
  Filled 2023-11-15: qty 30

## 2023-11-15 MED ORDER — MIDAZOLAM HCL 2 MG/2ML IJ SOLN
INTRAMUSCULAR | Status: AC
Start: 2023-11-15 — End: ?
  Filled 2023-11-15: qty 2

## 2023-11-15 MED ORDER — ALUM & MAG HYDROXIDE-SIMETH 200-200-20 MG/5ML PO SUSP
30.0000 mL | ORAL | Status: DC | PRN
Start: 2023-11-15 — End: 2023-11-16

## 2023-11-15 MED ORDER — ACETAMINOPHEN 10 MG/ML IV SOLN
INTRAVENOUS | Status: AC
  Filled 2023-11-15: qty 100

## 2023-11-15 MED ORDER — MENTHOL 3 MG MT LOZG: 1.0000 | LOZENGE | OROMUCOSAL | Status: DC | PRN

## 2023-11-15 MED ORDER — INSULIN ASPART 100 UNIT/ML IJ SOLN
0.0000 [IU] | Freq: Every day | INTRAMUSCULAR | Status: DC
Start: 2023-11-15 — End: 2023-11-16

## 2023-11-15 MED ORDER — OXYCODONE HCL 5 MG PO TABS
ORAL_TABLET | ORAL | Status: AC
  Filled 2023-11-15: qty 1

## 2023-11-15 MED ORDER — SODIUM CHLORIDE 0.9 % IR SOLN
Status: DC | PRN
  Administered 2023-11-15: 1000 mL

## 2023-11-15 MED ORDER — OXYCODONE HCL 5 MG PO TABS
10.0000 mg | ORAL_TABLET | ORAL | Status: DC | PRN
  Administered 2023-11-15 – 2023-11-16 (×4): 10 mg via ORAL
  Filled 2023-11-15 (×3): qty 2

## 2023-11-15 MED ORDER — DIPHENHYDRAMINE HCL 12.5 MG/5ML PO ELIX: 12.5000 mg | ORAL_SOLUTION | ORAL | Status: DC | PRN

## 2023-11-15 MED ORDER — PHENYLEPHRINE 80 MCG/ML (10ML) SYRINGE FOR IV PUSH (FOR BLOOD PRESSURE SUPPORT): PREFILLED_SYRINGE | INTRAVENOUS | Status: DC | PRN

## 2023-11-15 MED ORDER — METOCLOPRAMIDE HCL 5 MG PO TABS
5.0000 mg | ORAL_TABLET | Freq: Three times a day (TID) | ORAL | Status: DC | PRN
  Filled 2023-11-15: qty 2

## 2023-11-15 MED ORDER — CEFAZOLIN SODIUM-DEXTROSE 2-4 GM/100ML-% IV SOLN
INTRAVENOUS | Status: AC
  Filled 2023-11-15: qty 100

## 2023-11-15 MED ORDER — FENTANYL CITRATE (PF) 100 MCG/2ML IJ SOLN
25.0000 ug | INTRAMUSCULAR | Status: DC | PRN
  Administered 2023-11-15: 25 ug via INTRAVENOUS

## 2023-11-15 MED ORDER — TRANEXAMIC ACID-NACL 1000-0.7 MG/100ML-% IV SOLN
INTRAVENOUS | Status: AC
  Filled 2023-11-15: qty 100

## 2023-11-15 MED ORDER — METOCLOPRAMIDE HCL 5 MG/ML IJ SOLN
5.0000 mg | Freq: Three times a day (TID) | INTRAMUSCULAR | Status: DC | PRN
Start: 2023-11-15 — End: 2023-11-16

## 2023-11-15 MED ORDER — EPHEDRINE SULFATE-NACL 50-0.9 MG/10ML-% IV SOSY
PREFILLED_SYRINGE | INTRAVENOUS | Status: DC | PRN
  Administered 2023-11-15: 10 mg via INTRAVENOUS

## 2023-11-15 MED ORDER — PHENYLEPHRINE HCL-NACL 20-0.9 MG/250ML-% IV SOLN
INTRAVENOUS | Status: DC | PRN
  Administered 2023-11-15: 50 ug/min via INTRAVENOUS

## 2023-11-15 MED ORDER — CHLORHEXIDINE GLUCONATE 0.12 % MT SOLN: 15.0000 mL | Freq: Once | OROMUCOSAL | Status: AC

## 2023-11-15 MED ORDER — LOSARTAN POTASSIUM 50 MG PO TABS
50.0000 mg | ORAL_TABLET | Freq: Every day | ORAL | Status: DC
  Administered 2023-11-16: 50 mg via ORAL
  Filled 2023-11-15: qty 1

## 2023-11-15 MED ORDER — HYDROMORPHONE HCL 1 MG/ML IJ SOLN: 0.5000 mg | INTRAMUSCULAR | Status: DC | PRN

## 2023-11-15 MED ORDER — 0.9 % SODIUM CHLORIDE (POUR BTL) OPTIME
TOPICAL | Status: DC | PRN
  Administered 2023-11-15: 1000 mL

## 2023-11-15 MED ORDER — INSULIN ASPART 100 UNIT/ML IJ SOLN
0.0000 [IU] | Freq: Three times a day (TID) | INTRAMUSCULAR | Status: DC
  Administered 2023-11-15: 4 [IU] via SUBCUTANEOUS
  Administered 2023-11-16: 7 [IU] via SUBCUTANEOUS

## 2023-11-15 MED ORDER — INSULIN ASPART 100 UNIT/ML IJ SOLN
6.0000 [IU] | Freq: Three times a day (TID) | INTRAMUSCULAR | Status: DC
  Administered 2023-11-15 – 2023-11-16 (×2): 6 [IU] via SUBCUTANEOUS

## 2023-11-15 MED ORDER — PROPOFOL 10 MG/ML IV BOLUS
INTRAVENOUS | Status: DC | PRN
  Administered 2023-11-15: 140 mg via INTRAVENOUS

## 2023-11-15 MED ORDER — SODIUM CHLORIDE 0.9 % IV SOLN: INTRAVENOUS | Status: DC

## 2023-11-15 MED ORDER — FENTANYL CITRATE (PF) 250 MCG/5ML IJ SOLN
INTRAMUSCULAR | Status: DC | PRN
  Administered 2023-11-15: 50 ug via INTRAVENOUS
  Administered 2023-11-15: 25 ug via INTRAVENOUS

## 2023-11-15 MED ORDER — FENTANYL CITRATE (PF) 250 MCG/5ML IJ SOLN
INTRAMUSCULAR | Status: AC
  Filled 2023-11-15: qty 5

## 2023-11-15 SURGICAL SUPPLY — 60 items
BAG COUNTER SPONGE SURGICOUNT (BAG) ×1 IMPLANT
BANDAGE ESMARK 6X9 LF (GAUZE/BANDAGES/DRESSINGS) ×1 IMPLANT
BLADE SAG 18X100X1.27 (BLADE) ×1 IMPLANT
BNDG ELASTIC 6INX 5YD STR LF (GAUZE/BANDAGES/DRESSINGS) IMPLANT
BNDG ELASTIC 6X5.8 VLCR STR LF (GAUZE/BANDAGES/DRESSINGS) ×2 IMPLANT
BNDG ESMARK 6X9 LF (GAUZE/BANDAGES/DRESSINGS) ×1
BOWL SMART MIX CTS (DISPOSABLE) IMPLANT
COMP PATELLA PEG 3 32 (Joint) ×1 IMPLANT
COMPONENT PATELLA PEG 3 32 (Joint) IMPLANT
COOLER ICEMAN CLASSIC (MISCELLANEOUS) ×1 IMPLANT
COVER SURGICAL LIGHT HANDLE (MISCELLANEOUS) ×1 IMPLANT
CUFF TOURN SGL QUICK 42 (TOURNIQUET CUFF) IMPLANT
CUFF TRNQT CYL 34X4.125X (TOURNIQUET CUFF) ×1 IMPLANT
DRAPE EXTREMITY T 121X128X90 (DISPOSABLE) ×1 IMPLANT
DRAPE HALF SHEET 40X57 (DRAPES) ×1 IMPLANT
DRAPE U-SHAPE 47X51 STRL (DRAPES) ×1 IMPLANT
DRSG XEROFORM 1X8 (GAUZE/BANDAGES/DRESSINGS) IMPLANT
DURAPREP 26ML APPLICATOR (WOUND CARE) ×1 IMPLANT
ELECT CAUTERY BLADE 6.4 (BLADE) ×1 IMPLANT
ELECT REM PT RETURN 9FT ADLT (ELECTROSURGICAL) ×1
ELECTRODE REM PT RTRN 9FT ADLT (ELECTROSURGICAL) ×1 IMPLANT
FACESHIELD WRAPAROUND (MASK) ×2
FACESHIELD WRAPAROUND OR TEAM (MASK) ×2 IMPLANT
GAUZE PAD ABD 8X10 STRL (GAUZE/BANDAGES/DRESSINGS) ×1 IMPLANT
GAUZE SPONGE 4X4 12PLY STRL (GAUZE/BANDAGES/DRESSINGS) ×1 IMPLANT
GAUZE XEROFORM 1X8 LF (GAUZE/BANDAGES/DRESSINGS) ×1 IMPLANT
GLOVE BIOGEL PI IND STRL 8 (GLOVE) ×2 IMPLANT
GLOVE ORTHO TXT STRL SZ7.5 (GLOVE) ×1 IMPLANT
GLOVE SURG ORTHO 8.0 STRL STRW (GLOVE) ×1 IMPLANT
GOWN STRL REUS W/ TWL LRG LVL3 (GOWN DISPOSABLE) IMPLANT
GOWN STRL REUS W/ TWL XL LVL3 (GOWN DISPOSABLE) ×2 IMPLANT
IMMOBILIZER KNEE 22 UNIV (SOFTGOODS) ×1 IMPLANT
INSERT TIBIA KNEE RIGHT 10 (Joint) IMPLANT
IV NS 1000ML BAXH (IV SOLUTION) ×1 IMPLANT
KIT BASIN OR (CUSTOM PROCEDURE TRAY) ×1 IMPLANT
KIT TURNOVER KIT B (KITS) ×1 IMPLANT
MANIFOLD NEPTUNE II (INSTRUMENTS) ×1 IMPLANT
NDL 18GX1X1/2 (RX/OR ONLY) (NEEDLE) IMPLANT
NEEDLE 18GX1X1/2 (RX/OR ONLY) (NEEDLE)
NS IRRIG 1000ML POUR BTL (IV SOLUTION) ×1 IMPLANT
PACK TOTAL JOINT (CUSTOM PROCEDURE TRAY) ×1 IMPLANT
PAD ABD 8X10 STRL (GAUZE/BANDAGES/DRESSINGS) IMPLANT
PAD ARMBOARD 7.5X6 YLW CONV (MISCELLANEOUS) ×1 IMPLANT
PAD COLD SHLDR WRAP-ON (PAD) ×1 IMPLANT
PADDING CAST COTTON 6X4 STRL (CAST SUPPLIES) ×1 IMPLANT
PROS FEM KNEE PS STD 9 RT (Joint) ×1 IMPLANT
PROS TIB KNEE PS 0D F RT (Joint) ×1 IMPLANT
PROSTHESIS FEM KNEE PS STD9 RT (Joint) IMPLANT
PROSTHESIS TIB KNEE PS 0D F RT (Joint) IMPLANT
SET HNDPC FAN SPRY TIP SCT (DISPOSABLE) ×1 IMPLANT
SET PAD KNEE POSITIONER (MISCELLANEOUS) ×1 IMPLANT
STAPLER VISISTAT 35W (STAPLE) ×1 IMPLANT
SUCTION TUBE FRAZIER 10FR DISP (SUCTIONS) ×1 IMPLANT
SUT VIC AB 0 CT1 27XBRD ANBCTR (SUTURE) ×1 IMPLANT
SUT VIC AB 1 CT1 27XBRD ANBCTR (SUTURE) ×2 IMPLANT
SUT VIC AB 2-0 CT1 TAPERPNT 27 (SUTURE) ×2 IMPLANT
SYR 50ML LL SCALE MARK (SYRINGE) IMPLANT
TOWEL GREEN STERILE (TOWEL DISPOSABLE) ×1 IMPLANT
TOWEL GREEN STERILE FF (TOWEL DISPOSABLE) ×1 IMPLANT
TRAY CATH INTERMITTENT SS 16FR (CATHETERS) IMPLANT

## 2023-11-15 NOTE — Progress Notes (Signed)
 Pt. Reported he took the insulin  pump off 2 days ago due to it irritating his skin. He has been giving himself humalog  injections, the site of the insulin  pump is redden in 2 areas on the right lower abdomen. No drainage reported. This am at 0800 he gave himself 8 units of humolog insulin .

## 2023-11-15 NOTE — Evaluation (Signed)
 Physical Therapy Evaluation Patient Details Name: Samuel [name] MRN: [id number] DOB: [birth year] Today's Date: 11/15/2023  History of Present Illness  73 y.o. male presents to [facility] hospital on 11/15/2023 for elective R TKA. PMH includes HTN, HLD, asthma, diabetes, L TKA.  Clinical Impression  Pt presents to PT with deficits in functional mobility, gait, balance, ROM, strength, power. Pt with 3 instances of R knee buckling during ambulation, final 2 occurring along with dizziness and pallor when returning from bathroom. Pt is assisted back to bed and reports improvement in symptoms once in supine. PT provides education on TKR exercise packet. PT will follow up tomorrow for progression of functional mobility and gait.         If plan is discharge home, recommend the following: A little help with walking and/or transfers;A little help with bathing/dressing/bathroom;Assistance with cooking/housework;Assist for transportation;Help with stairs or ramp for entrance   Can travel by private vehicle        Equipment Recommendations None recommended by PT  Recommendations for Other Services       Functional Status Assessment Patient has had a recent decline in their functional status and demonstrates the ability to make significant improvements in function in a reasonable and predictable amount of time.     Precautions / Restrictions Precautions Precautions: Fall Restrictions Weight Bearing Restrictions Per Provider Order: Yes RLE Weight Bearing Per Provider Order: Weight bearing as tolerated      Mobility  Bed Mobility Overal bed mobility: Needs Assistance Bed Mobility: Supine to Sit, Sit to Supine     Supine to sit: Min assist, HOB elevated, Used rails Sit to supine: Min assist   General bed mobility comments: spouse assisting with supine to sit, PT assist for RLE when returning to bed    Transfers Overall transfer level: Needs assistance Equipment used: Rolling walker (2  wheels) Transfers: Sit to/from Stand Sit to Stand: Contact guard assist                Ambulation/Gait Ambulation/Gait assistance: Mod assist Gait Distance (Feet): 30 Feet Assistive device: Rolling walker (2 wheels) Gait Pattern/deviations: Step-to pattern, Knees buckling Gait velocity: reduced Gait velocity interpretation: <1.31 ft/sec, indicative of household ambulator   General Gait Details: pt with slowed step-to gait, 1 instance of knee buckling with initial pre-gait activites at edge of bed, 2nd and 3rd instances of knee buckling near return to bed associated with pt reports of dizziness requiring modA for PT to correct loss of balance  Stairs            Wheelchair Mobility     Tilt Bed    Modified Rankin (Stroke Patients Only)       Balance Overall balance assessment: Needs assistance Sitting-balance support: No upper extremity supported, Feet supported Sitting balance-Leahy Scale: Good     Standing balance support: Bilateral upper extremity supported, Reliant on assistive device for balance Standing balance-Leahy Scale: Poor Standing balance comment: CGA for static standing                             Pertinent Vitals/Pain Pain Assessment Pain Assessment: 0-10 Pain Score: 7  Pain Location: R knee Pain Descriptors / Indicators: Sore Pain Intervention(s): Monitored during session    Home Living Family/patient expects to be discharged to:: Private residence Living Arrangements: Spouse/significant other;Children Available Help at Discharge: Family;Available 24 hours/day Type of Home: House Home Access: Level entry       Home  Layout: One level Home Equipment: [employer] (2 wheels);Cane - single point;Shower seat      Prior Function Prior Level of Function : Independent/Modified Independent;Driving                     Extremity/Trunk Assessment   Upper Extremity Assessment Upper Extremity Assessment: Overall WFL for  tasks assessed    Lower Extremity Assessment Lower Extremity Assessment: RLE deficits/detail RLE Deficits / Details: generalized post-op weakness and ROM restrictions as expected on POD 0    Cervical / Trunk Assessment Cervical / Trunk Assessment: Normal  Communication   Communication Communication: No apparent difficulties Cueing Techniques: Verbal cues  Cognition Arousal: Alert Behavior During Therapy: WFL for tasks assessed/performed Overall Cognitive Status: Within Functional Limits for tasks assessed                                          General Comments General comments (skin integrity, edema, etc.): pt reports dizziness when in bathroom, PT unable to check BP at time but pt does appear more pale, may be experiencing some hypotension due to anesthesia and lack of intake of food since surgery    Exercises     Assessment/Plan    PT Assessment Patient needs continued PT services  PT Problem List Decreased strength;Decreased range of motion;Decreased activity tolerance;Decreased balance;Decreased mobility;Decreased knowledge of use of DME;Decreased safety awareness;Pain       PT Treatment Interventions DME instruction;Gait training;Functional mobility training;Therapeutic activities;Balance training;Therapeutic exercise;Neuromuscular re-education;Patient/family education    PT Goals (Current goals can be found in the Care Plan section)  Acute Rehab PT Goals Patient Stated Goal: to return to independence PT Goal Formulation: With patient Time For Goal Achievement: 11/19/23 Potential to Achieve Goals: Good    Frequency Min 1X/week     Co-evaluation               AM-PAC PT 6 Clicks Mobility  Outcome Measure Help needed turning from your back to your side while in a flat bed without using bedrails?: A Little Help needed moving from lying on your back to sitting on the side of a flat bed without using bedrails?: A Little Help needed moving  to and from a bed to a chair (including a wheelchair)?: A Little Help needed standing up from a chair using your arms (e.g., wheelchair or bedside chair)?: A Little Help needed to walk in hospital room?: A Lot Help needed climbing 3-5 steps with a railing? : Total 6 Click Score: 15    End of Session Equipment Utilized During Treatment: Gait belt Activity Tolerance: Treatment limited secondary to medical complications (Comment) (possible hypotension, knee buckling) Patient left: in bed;with call bell/phone within reach;with family/visitor present Nurse Communication: Mobility status PT Visit Diagnosis: Other abnormalities of gait and mobility (R26.89);Muscle weakness (generalized) (M62.81);Pain Pain - Right/Left: Right Pain - part of body: Knee    Time: 8258-8191 PT Time Calculation (min) (ACUTE ONLY): 27 min   Charges:   PT Evaluation $PT Eval Low Complexity: 1 Low   PT General Charges $$ ACUTE PT VISIT: 1 Visit         Bernardino JINNY Ruth, PT, DPT Acute Rehabilitation Office [phone]   Bernardino JINNY Ruth 11/15/2023, 6:24 PM

## 2023-11-15 NOTE — Anesthesia Postprocedure Evaluation (Signed)
 Anesthesia Post Note  Patient: Samuel [name]  Procedure(s) Performed: RIGHT TOTAL KNEE ARTHROPLASTY (Right: Knee)     Patient location during evaluation: PACU Anesthesia Type: Regional Level of consciousness: awake and alert Pain management: pain level controlled Vital Signs Assessment: post-procedure vital signs reviewed and stable Respiratory status: spontaneous breathing, nonlabored ventilation, respiratory function stable and patient connected to nasal cannula oxygen Cardiovascular status: blood pressure returned to baseline and stable Postop Assessment: no apparent nausea or vomiting Anesthetic complications: no   No notable events documented.  Last Vitals:  Vitals:   11/15/23 1700 11/15/23 2022  BP: 137/74 131/70  Pulse: 70 69  Resp: 18 18  Temp: 36.5 C 36.7 C  SpO2: 99% 100%    Last Pain:  Vitals:   11/15/23 2022  TempSrc: Oral  PainSc:                  Garnette DELENA Gab

## 2023-11-15 NOTE — Discharge Instructions (Signed)

## 2023-11-15 NOTE — Interval H&P Note (Signed)
 History and Physical Interval Note: The patient presented today for a right total knee replacement to treat his right knee arthritis.  Having had this before his left knee he is fully aware of the risks and benefits of surgery.  There has been no acute change in his medical status.  Informed consent has been obtained and the right has been marked. 11/15/2023 10:51 AM  Sheree MALVA Brash  has presented today for surgery, with the diagnosis of Right Knee Osteoarthritis.  The various methods of treatment have been discussed with the patient and family. After consideration of risks, benefits and other options for treatment, the patient has consented to  Procedure(s): RIGHT TOTAL KNEE ARTHROPLASTY (Right) as a surgical intervention.  The patient's history has been reviewed, patient examined, no change in status, stable for surgery.  I have reviewed the patient's chart and labs.  Questions were answered to the patient's satisfaction.     Lonni CINDERELLA Poli

## 2023-11-15 NOTE — Transfer of Care (Signed)
 Immediate Anesthesia Transfer of Care Note  Patient: Samuel [name]  Procedure(s) Performed: RIGHT TOTAL KNEE ARTHROPLASTY (Right: Knee)  Patient Location: PACU  Anesthesia Type:GA combined with regional for post-op pain  Level of Consciousness: awake and alert   Airway & Oxygen Therapy: Patient Spontanous Breathing and Patient connected to nasal cannula oxygen  Post-op Assessment: Report given to RN and Post -op Vital signs reviewed and stable  Post vital signs: Reviewed and stable  Last Vitals:  Vitals Value Taken Time  BP 136/80 11/15/23 1437  Temp    Pulse 84 11/15/23 1441  Resp 13 11/15/23 1441  SpO2 97 % 11/15/23 1441  Vitals shown include unfiled device data.  Last Pain:  Vitals:   11/15/23 1135  TempSrc:   PainSc: 0-No pain         Complications: No notable events documented.

## 2023-11-15 NOTE — Telephone Encounter (Signed)
 Sent new prescription of insulin with higher dose.

## 2023-11-15 NOTE — Anesthesia Procedure Notes (Signed)
 Procedure Name: LMA Insertion Date/Time: 11/15/2023 1:00 PM  Performed by: Evette Ade, CRNAPre-anesthesia Checklist: Patient identified, Emergency Drugs available, Suction available, Patient being monitored and Timeout performed Patient Re-evaluated:Patient Re-evaluated prior to induction Oxygen Delivery Method: Circle system utilized Preoxygenation: Pre-oxygenation with 100% oxygen Induction Type: IV induction Ventilation: Mask ventilation without difficulty LMA: LMA inserted LMA Size: 4.0 Number of attempts: 1 Placement Confirmation: positive ETCO2 and breath sounds checked- equal and bilateral Tube secured with: Tape Dental Injury: Teeth and Oropharynx as per pre-operative assessment

## 2023-11-15 NOTE — Telephone Encounter (Signed)
 I would recommend to refill Pods and restart after the surgery.

## 2023-11-15 NOTE — Care Plan (Signed)
 Ortho Bundle Case Management Note  Patient Details  Name: Samuel [name] MRN: [id number] Date of Birth: April 21, 1951  [facility] RNCM call to patient and spoke with him and his daughter via telephone to discuss his upcoming Right total knee arthroplasty with Dr. Vernetta on 11/15/23 at [facility] His daughter Doyal has flown in to stay with him for next 2 weeks. He is agreeable to case management. His plan is to return home with assistance from his daughters. He has a RW, cane and previous ice machine from other knee replacement. Anticipate HHPT will be needed after short hospital stay. Referral made to [facility] after choice provided. Reviewed all post op care instructions. Will continue to follow for needs.                  DME Arranged:   (Has RW, cane already) DME Agency:     HH Arranged:  PT HH Agency:  [facility]  Additional Comments: Please contact me with any questions of if this plan should need to change.  Tylene Ned, RN, BSN, [employer]  [phone] 11/15/2023, 4:49 PM

## 2023-11-15 NOTE — Op Note (Signed)
 Operative Note  Date of operation: 11/15/2023 Preoperative diagnosis: Right knee primary osteoarthritis Postoperative diagnosis: Same  Procedure: Right press-fit total knee arthroplasty  Implants: Biomet/Zimmer persona press-fit knee system Implant Name Type Inv. Item Serial No. Manufacturer Lot No. LRB No. Used Action  COMP PATELLA PEG 3 32 - [id number] Joint COMP PATELLA PEG 3 32  ZIMMER [employer]ON(ORTH,TRAU,BIO,SG) [id number] Right 1 Implanted  INSERT TIBIA KNEE RIGHT 10 - [id number] Joint INSERT TIBIA KNEE RIGHT 10  ZIMMER [employer]ON(ORTH,TRAU,BIO,SG) [id number] Right 1 Implanted  PROS FEM KNEE PS STD 9 RT - [id number] Joint PROS FEM KNEE PS STD 9 RT  ZIMMER [employer]ON(ORTH,TRAU,BIO,SG) [id number] Right 1 Implanted  PROS TIB KNEE PS 0D F RT - [id number] Joint PROS TIB KNEE PS 0D F RT  ZIMMER [employer]ON(ORTH,TRAU,BIO,SG) [id number] Right 1 Implanted   Surgeon: Lonni GRADE. Vernetta, MD Assistant: Tory Gaskins, PA-C  Anesthesia: #1 right lower extremity adductor canal block, #2 General, #3 local Tourniquet time: Under 1 hour EBL: Less than 100 cc Antibiotics: IV Ancef  Complications: None  Indications: The patient is a 73 year old gentleman with debilitating arthritis involving his right knee.  He has bone-on-bone wear of that knee on radiographic findings and clinical exam findings shows varus malalignment.  He has tried and failed conservative treatment for over a year.  We have actually replaced his left knee for similar reasons.  Having had this before on the left side he is fully aware of the risks of acute blood loss anemia, nerve vessel injury, fracture, infection, DVT, implant failure and wound healing issues.  At this point his pain is daily and it is detrimentally affecting his mobility, his quality of life and his actives daily living.  He understands that our goals are hopefully decreased pain, improve mobility, and improve quality of life.  Procedure description: After informed consent was obtained and  the appropriate right knee was marked, anesthesia obtained a right lower extremity regional block in the holding room.  The patient was then brought to the operating room and general anesthesia was obtained while she he was supine on the operating table.  A nonsterile tourniquet was placed around his upper right thigh and his right thigh, knee, leg and ankle were prepped and draped with DuraPrep and sterile drapes in a sterile stockinette.  A timeout was called and he was then identified as the correct patient and the correct right knee.  An Esmarch was then used to wrap out the leg and the tourniquet was inflated to 300 mm pressure.  With the knee extended a direct midline incision was made over the patella and carried proximally and distally.  Dissection was carried down to the knee joint and a medial parapatellar arthrotomy is made finding significant joint effusion.  With the knee in a flexed position we found complete cartilage loss on the medial compartment of the knee and the patellofemoral joint.  There is osteophytes in all 3 compartments was removed as well as remnants of the ACL and medial lateral meniscus.  Using an extramedullary cutting guide for proximal tibia cut with correction for varus and valgus and a 7 degree slope.  We made our cut to take 2 mm off the low side and backed this down to more millimeters.  We then used a intramedullary referencing guide for distal femur cut setting this for a right knee at 5 degrees externally rotated for 10 mm distal femoral cut.  We made that cut without difficulty and brought the knee back down to full extension and had  achieved full extension with a 10 mm extension block.  We then impacted the femur and put a femoral sizing guide based off the epicondylar axis.  Based off of this we chose a size 9 femur.  We put a 4-in-1 cutting block for a size 9 femur and made our anterior and posterior cuts followed by the chamfer cuts.  We then backed the tibia and chose a  size F tibial tray for coverage over the tibial plateau setting the rotation of the tibial tubercle and the femur.  We made our keel punch and drill hole off of this.  We found excellent quality bone for press-fit implants.  We then trialed our size F right tibial tray combined with our size 9 right CR standard femur.  We placed a 10 mm right fixed-bearing polythene insert and we are pleased the range of motion and stability without insert.  We then made our patella cut and drilled 3 holes for a size 32 patella button.  With all trial instrumentation the knee we put the knee through range of motion we are pleased with range of motion and stability.  We then removed all transportation from the knee and the knee was irrigated with normal saline solution.  We then placed Marcaine  with epinephrine  around the arthrotomy.  Next with the knee in the flexed position we placed our Biomet/Zimmer persona press-fit tibial tray for right knee size F followed by placing our press-fit size 9 right CR standard femur.  We placed our 10 mm right fixed-bearing medial congruent polythene insert and press-fit our size 32 patella button.  Again we are pleased with range of motion and stability with these implants in place.  We then let the tourniquet down and hemostasis was obtained with electrocautery.  The arthrotomy was then closed with interrupted #1 Vicryl suture followed by 0 Vicryl close the deep tissue and 2-0 Vicryl close subcutaneous tissue.  The skin was closed with staples.  Well-padded sterile dressing was applied.  The patient was awakened, extubated and taken the recovery room in stable condition.  Tory Gaskins, PA-C did assist during the entire case and beginning to end and his assistance was crucial and medically necessary for soft tissue management and retraction, helping guide implant placement and a layered closure of the wound.

## 2023-11-16 ENCOUNTER — Encounter (HOSPITAL_COMMUNITY): Payer: Self-pay | Admitting: Orthopaedic Surgery

## 2023-11-16 DIAGNOSIS — M1711 Unilateral primary osteoarthritis, right knee: Secondary | ICD-10-CM | POA: Diagnosis not present

## 2023-11-16 LAB — CBC
HCT: 39.3 % (ref 39.0–52.0)
Hemoglobin: 13.5 g/dL (ref 13.0–17.0)
MCH: 30.5 pg (ref 26.0–34.0)
MCHC: 34.4 g/dL (ref 30.0–36.0)
MCV: 88.7 fL (ref 80.0–100.0)
Platelets: 173 10*3/uL (ref 150–400)
RBC: 4.43 MIL/uL (ref 4.22–5.81)
RDW: 13.3 % (ref 11.5–15.5)
WBC: 8.7 10*3/uL (ref 4.0–10.5)
nRBC: 0 % (ref 0.0–0.2)

## 2023-11-16 LAB — BASIC METABOLIC PANEL
Anion gap: 10 (ref 5–15)
BUN: 16 mg/dL (ref 8–23)
CO2: 26 mmol/L (ref 22–32)
Calcium: 8.7 mg/dL — ABNORMAL LOW (ref 8.9–10.3)
Chloride: 97 mmol/L — ABNORMAL LOW (ref 98–111)
Creatinine, Ser: 0.95 mg/dL (ref 0.61–1.24)
GFR, Estimated: >60 mL/min (ref >60)
Glucose, Bld: 259 mg/dL — ABNORMAL HIGH (ref 70–99)
Potassium: 4.2 mmol/L (ref 3.5–5.1)
Sodium: 133 mmol/L — ABNORMAL LOW (ref 135–145)

## 2023-11-16 LAB — GLUCOSE, CAPILLARY: Glucose-Capillary: 236 mg/dL — ABNORMAL HIGH (ref 70–99)

## 2023-11-16 MED ORDER — OXYCODONE HCL 5 MG PO TABS: 5.0000 mg | ORAL_TABLET | Freq: Four times a day (QID) | ORAL | 0 refills | Status: DC | PRN

## 2023-11-16 MED ORDER — ASPIRIN 81 MG PO CHEW: 81.0000 mg | CHEWABLE_TABLET | Freq: Two times a day (BID) | ORAL | 0 refills | Status: AC

## 2023-11-16 MED ORDER — METHOCARBAMOL 500 MG PO TABS: 500.0000 mg | ORAL_TABLET | Freq: Four times a day (QID) | ORAL | 1 refills | Status: AC | PRN

## 2023-11-16 NOTE — Progress Notes (Signed)
 Subjective: 1 Day Post-Op Procedure(s) (LRB): RIGHT TOTAL KNEE ARTHROPLASTY (Right) Patient reports pain as moderate.  No compliants. Wants to go home later today.   Objective: Vital signs in last 24 hours: Temp:  [97.7 F (36.5 C)-99 F (37.2 C)] 99 F (37.2 C) (01/08 0809) Pulse Rate:  [68-88] 82 (01/08 0809) Resp:  [0-25] 18 (01/08 0809) BP: (107-147)/(68-90) 133/68 (01/08 0809) SpO2:  [94 %-100 %] 99 % (01/08 0809) Weight:  [93.4 kg] 93.4 kg (01/07 1008)  Intake/Output from previous day: 01/07 0701 - 01/08 0700 In: 1140 [P.O.:240; I.V.:900] Out: 30 [Blood:30] Intake/Output this shift: No intake/output data recorded.  No results for input(s): HGB in the last 72 hours. No results for input(s): WBC, RBC, HCT, PLT in the last 72 hours. No results for input(s): NA, K, CL, CO2, BUN, CREATININE, GLUCOSE, CALCIUM  in the last 72 hours. No results for input(s): LABPT, INR in the last 72 hours.  Dorsiflexion/Plantar flexion intact Incision: scant drainage Compartment soft   Assessment/Plan: 1 Day Post-Op Procedure(s) (LRB): RIGHT TOTAL KNEE ARTHROPLASTY (Right) Up with therapy Discharge to home later today if he remains stable and does well with PT.  Labs pending.      Suzzane Quilter 11/16/2023, 8:16 AM

## 2023-11-16 NOTE — Discharge Summary (Signed)
 Patient ID: Samuel [name] MRN: [id number] DOB/AGE: 73-31-1952 73 y.o.  Admit date: 11/15/2023 Discharge date: 11/16/2023  Admission Diagnoses:  Principal Problem:   Unilateral primary osteoarthritis, right knee Active Problems:   Status post total right knee replacement   Discharge Diagnoses:  Same  Past Medical History:  Diagnosis Date   Arthritis    Blood transfusion without reported diagnosis    Diabetes mellitus without complication (HCC)    type II   History of ETOH abuse    Hypertension     Surgeries: Procedure(s): RIGHT TOTAL KNEE ARTHROPLASTY on 11/15/2023   Consultants:   Discharged Condition: Improved  Hospital Course: THANG FLETT is an 73 y.o. male who was admitted 11/15/2023 for operative treatment ofUnilateral primary osteoarthritis, right knee. Patient has severe unremitting pain that affects sleep, daily activities, and work/hobbies. After pre-op clearance the patient was taken to the operating room on 11/15/2023 and underwent  Procedure(s): RIGHT TOTAL KNEE ARTHROPLASTY.    Patient was given perioperative antibiotics:  Anti-infectives (From admission, onward)    Start     Dose/Rate Route Frequency Ordered Stop   11/15/23 1900  ceFAZolin  (ANCEF ) IVPB 2g/100 mL premix        2 g 200 mL/hr over 30 Minutes Intravenous Every 6 hours 11/15/23 1444 11/16/23 0123   11/15/23 1001  ceFAZolin  (ANCEF ) 2-4 GM/100ML-% IVPB       Note to Pharmacy: Claudene Craven D: cabinet override      11/15/23 1001 11/15/23 2214   11/15/23 1000  ceFAZolin  (ANCEF ) IVPB 2g/100 mL premix        2 g 200 mL/hr over 30 Minutes Intravenous On call to O.R. 11/15/23 0955 11/15/23 1245        Patient was given sequential compression devices, early ambulation, and chemoprophylaxis to prevent DVT.  Patient benefited maximally from hospital stay and there were no complications.    Recent vital signs: Patient Vitals for the past 24 hrs:  BP Temp Temp src Pulse Resp SpO2 Height Weight   11/16/23 0809 133/68 99 F (37.2 C) Oral 82 18 99 % -- --  11/16/23 0618 130/79 98.9 F (37.2 C) Oral 77 18 99 % -- --  11/16/23 0010 126/76 98.3 F (36.8 C) Oral 75 18 100 % -- --  11/15/23 2022 131/70 98 F (36.7 C) Oral 69 18 100 % -- --  11/15/23 1700 137/74 97.7 F (36.5 C) Oral 70 18 99 % -- --  11/15/23 1615 118/85 -- -- 73 18 98 % -- --  11/15/23 1545 126/75 97.8 F (36.6 C) -- 72 17 96 % -- --  11/15/23 1530 133/82 -- -- 75 20 97 % -- --  11/15/23 1515 134/83 -- -- 75 11 97 % -- --  11/15/23 1500 121/75 -- -- 77 18 95 % -- --  11/15/23 1445 107/71 -- -- 88 17 96 % -- --  11/15/23 1437 136/80 97.8 F (36.6 C) -- 84 15 97 % -- --  11/15/23 1135 (!) 140/79 -- -- 72 (!) 5 94 % -- --  11/15/23 1130 (!) 141/74 -- -- 68 (!) 0 95 % -- --  11/15/23 1125 (!) 144/90 -- -- 73 (!) 25 96 % -- --  11/15/23 1008 (!) 147/69 98.2 F (36.8 C) Oral 74 20 97 % 5' 4 (1.626 m) 93.4 kg     Recent laboratory studies: No results for input(s): WBC, HGB, HCT, PLT, NA, K, CL, CO2, BUN, CREATININE, GLUCOSE, INR, CALCIUM  in the last  72 hours.  Invalid input(s): PT, 2   Discharge Medications:   Allergies as of 11/16/2023   No Known Allergies      Medication List     TAKE these medications    aspirin  81 MG chewable tablet Chew 1 tablet (81 mg total) by mouth 2 (two) times daily. What changed: when to take this   atorvastatin  40 MG tablet Commonly known as: LIPITOR TAKE 1 TABLET BY MOUTH EVERY MORNING AND 1/2 AT BEDTIME   fluticasone  50 MCG/ACT nasal spray Commonly known as: FLONASE  Place 2 sprays into both nostrils daily. What changed:  when to take this reasons to take this   FreeStyle Libre 2 Sensor Misc 2 Devices by Does not apply route every 14 (fourteen) days.   FREESTYLE LITE test strip Generic drug: glucose blood USE AS INSTRUCTED FOUR TIMES DAILY   Gvoke HypoPen  1-Pack 1 MG/0.2ML Soaj Generic drug: Glucagon  Inject 1 mg into the skin as  needed (low blood sugar with impaired consciousness).   insulin  lispro 100 UNIT/ML injection Commonly known as: HumaLOG  Max daily 125 units per pump   Insulin  Pen Needle 32G X 4 MM Misc Use 5 pens per day to inject insulin    B-D UF III MINI PEN NEEDLES 31G X 5 MM Misc Generic drug: Insulin  Pen Needle USE FIVE PER DAY AS DIRECTED   INSULIN  SYRINGE .5CC/30GX5/16 30G X 5/16 0.5 ML Misc Use 3 times a day for insulin    levocetirizine 5 MG tablet Commonly known as: XYZAL  Take 1 tablet (5 mg total) by mouth every evening.   losartan  50 MG tablet Commonly known as: COZAAR  TAKE 1 TABLET(50 MG) BY MOUTH DAILY   methocarbamol  500 MG tablet Commonly known as: ROBAXIN  Take 1 tablet (500 mg total) by mouth every 6 (six) hours as needed for muscle spasms.   Omnipod DASH Intro (Gen 4) Kit Change every 3 days   Omnipod DASH Pods (Gen 4) Misc USE FOR INSULIN  PUMP EVEY 3 DAYS   OVER THE COUNTER MEDICATION Take 2 tablets by mouth daily. 4LIFE TRANSFER FACTORY RECALL   oxyCODONE  5 MG immediate release tablet Commonly known as: Oxy IR/ROXICODONE  Take 1-2 tablets (5-10 mg total) by mouth every 6 (six) hours as needed for moderate pain (pain score 4-6) (pain score 4-6).   Synjardy  XR 03-999 MG Tb24 Generic drug: Empagliflozin -metFORMIN  HCl ER Take 2 tablets by mouth daily.   Trulicity  4.5 MG/0.5ML Soaj Generic drug: Dulaglutide  Inject 4.5 mg as directed once a week.               Durable Medical Equipment  (From admission, onward)           Start     Ordered   11/15/23 1644  DME 3 n 1  Once        11/15/23 1643   11/15/23 1644  DME Walker rolling  Once       Question Answer Comment  Walker: With 5 Inch Wheels   Patient needs a walker to treat with the following condition Status post total right knee replacement      11/15/23 1643            Diagnostic Studies: DG Knee Right Port Result Date: 11/15/2023 CLINICAL DATA:  Status post total right knee  arthroplasty. EXAM: PORTABLE RIGHT KNEE - 1-2 VIEW COMPARISON:  Right knee radiographs 06/13/2023 FINDINGS: Interval total right knee arthroplasty. No perihardware lucency is seen to indicate hardware failure or loosening. Expected postoperative changes including intra-articular and  subcutaneous air. Small to moderatejoint effusion. Anterior surgical skin staples. No acute fracture or dislocation. IMPRESSION: Interval total right knee arthroplasty without evidence of hardware failure. Electronically Signed   By: Tanda Lyons M.D.   On: 11/15/2023 17:11    Disposition: Discharge disposition: 06-Home-Health Care Svc          Follow-up Information     Vernetta Lonni GRADE, MD. Go on 11/28/2023.   Specialty: Orthopedic Surgery Why: at 3:30 pm for your first post operative appointment in Dr. Damian office Contact information: [address] [place] [address] [phone]         [facility]  Follow up.   Why: Someone from the home health agency will be in contact with you to arrange your first in home physical therapy appointment        [facility]. Go on 11/29/2023.   Why: at 12:00 pm for your first Outpatient Physical Therapy appointment Contact information: [address] [place], [place] [address]  [phone]                 Signed: BERTRUM GASKINS 11/16/2023, 8:32 AM

## 2023-11-16 NOTE — Progress Notes (Signed)
 Physical Therapy Treatment Patient Details Name: Samuel [name] MRN: [id number] DOB: 04-Mar-1951 Today's Date: 11/16/2023   History of Present Illness 73 y.o. male presents to [facility] hospital on 11/15/2023 for elective R TKA. PMH includes HTN, HLD, asthma, diabetes, L TKA.    PT Comments  Pt resting in bed on arrival, agreeable to session and demonstrating good progress towards acute goals. Pt able to come to sitting EOB with light min A to elevate trunk from flat bed to simulate home set up. Pt requiring grossly CGA for transfers and gait with RW for support with pt progressing gait distance without instance of knee buckling and no c/o dizziness throughout mobility. Pt daughter present and supportive throughout session. Pt was educated on continued walker use to maximize functional independence, safety, and decrease risk for falls as well as appropriate activity progression, ice, safe car entry/exit and HEP with pt and pt daughter verbalizing understanding. Anticipate safe discharge, with assist level outlined below, once medically cleared, will continue to follow acutely.     If plan is discharge home, recommend the following: A little help with walking and/or transfers;A little help with bathing/dressing/bathroom;Assistance with cooking/housework;Assist for transportation;Help with stairs or ramp for entrance   Can travel by private vehicle        Equipment Recommendations  None recommended by PT    Recommendations for Other Services       Precautions / Restrictions Precautions Precautions: Fall Restrictions Weight Bearing Restrictions Per Provider Order: Yes RLE Weight Bearing Per Provider Order: Weight bearing as tolerated     Mobility  Bed Mobility Overal bed mobility: Needs Assistance Bed Mobility: Supine to Sit, Sit to Supine     Supine to sit: Min assist Sit to supine: Contact guard assist   General bed mobility comments: light min A to elevate trunk from flat bed without rail  use to simulate home, pt able to hook LLE under R ankle to return to supine with CGA for safety    Transfers Overall transfer level: Needs assistance Equipment used: Rolling walker (2 wheels) Transfers: Sit to/from Stand Sit to Stand: Contact guard assist           General transfer comment: CGA for safety, cues o extend RLE before comingo to sit for pain mitigation    Ambulation/Gait Ambulation/Gait assistance: Contact guard assist Gait Distance (Feet): 150 Feet Assistive device: Rolling walker (2 wheels) Gait Pattern/deviations: Step-to pattern, Step-through pattern, Decreased stride length, Decreased weight shift to right Gait velocity: reduced     General Gait Details: slow but steady gait with RW support, step-through pattern with very short strides progressing to step-through pattern with more symmetrical step lengths with increased distance, no instances of knee buckling this session   Stairs             Wheelchair Mobility     Tilt Bed    Modified Rankin (Stroke Patients Only)       Balance Overall balance assessment: Needs assistance Sitting-balance support: No upper extremity supported, Feet supported Sitting balance-Leahy Scale: Good     Standing balance support: Bilateral upper extremity supported, Reliant on assistive device for balance Standing balance-Leahy Scale: Poor Standing balance comment: reliant on UE support                            Cognition Arousal: Alert Behavior During Therapy: WFL for tasks assessed/performed Overall Cognitive Status: Within Functional Limits for tasks assessed  Exercises Other Exercises Other Exercises: knee flexion in sitting x10, quad sets in supine x10    General Comments General comments (skin integrity, edema, etc.): pt daughter presnent and supportive      Pertinent Vitals/Pain Pain Assessment Pain Assessment: Faces Faces  Pain Scale: Hurts little more Pain Location: R knee Pain Descriptors / Indicators: Sore Pain Intervention(s): Monitored during session, Limited activity within patient's tolerance, Premedicated before session    Home Living                          Prior Function            PT Goals (current goals can now be found in the care plan section) Acute Rehab PT Goals Patient Stated Goal: to return to independence PT Goal Formulation: With patient Time For Goal Achievement: 11/19/23 Progress towards PT goals: Progressing toward goals    Frequency    Min 1X/week      PT Plan      Co-evaluation              AM-PAC PT 6 Clicks Mobility   Outcome Measure  Help needed turning from your back to your side while in a flat bed without using bedrails?: A Little Help needed moving from lying on your back to sitting on the side of a flat bed without using bedrails?: A Little Help needed moving to and from a bed to a chair (including a wheelchair)?: A Little Help needed standing up from a chair using your arms (e.g., wheelchair or bedside chair)?: A Little Help needed to walk in hospital room?: A Little Help needed climbing 3-5 steps with a railing? : Total 6 Click Score: 16    End of Session Equipment Utilized During Treatment: Gait belt Activity Tolerance: Patient tolerated treatment well Patient left: in bed;with call bell/phone within reach;with family/visitor present Nurse Communication: Mobility status;Other (comment) (pt without need for PM session) PT Visit Diagnosis: Other abnormalities of gait and mobility (R26.89);Muscle weakness (generalized) (M62.81);Pain Pain - Right/Left: Right Pain - part of body: Knee     Time: 9090-9067 PT Time Calculation (min) (ACUTE ONLY): 23 min  Charges:    $Gait Training: 23-37 mins PT General Charges $$ ACUTE PT VISIT: 1 Visit                     Avya Flavell R. PTA Acute Rehabilitation Services Office:  [phone]   Therisa CHRISTELLA Boor 11/16/2023, 10:22 AM

## 2023-11-16 NOTE — Progress Notes (Signed)
 OT Cancellation Note  Patient Details Name: Samuel [name] MRN: [id number] DOB: May 14, 1951   Cancelled Treatment:    Reason Eval/Treat Not Completed: Per PT Pt is mobilizing well, Pt's daughter is present and planning on being with patient for 2 weeks, already has shower set up and thinking ahead. AT this time OT screened, no needs identified, will sign off  Leita JINNY Odea 11/16/2023, 9:31 AM  Leita DEL OTR/L Acute Rehabilitation Services Office: [phone]

## 2023-11-16 NOTE — Progress Notes (Signed)
 Patient alert and oriented, ambulate, void, surgical site clean and dry no sign of infection. D/c instructions explain  and given to the patient daughter in [place] and Hispanic. All questions answered to patient and family understanding.

## 2023-11-17 DIAGNOSIS — Z471 Aftercare following joint replacement surgery: Secondary | ICD-10-CM | POA: Diagnosis not present

## 2023-11-17 DIAGNOSIS — I10 Essential (primary) hypertension: Secondary | ICD-10-CM | POA: Diagnosis not present

## 2023-11-17 DIAGNOSIS — Z7984 Long term (current) use of oral hypoglycemic drugs: Secondary | ICD-10-CM | POA: Diagnosis not present

## 2023-11-17 DIAGNOSIS — Z7982 Long term (current) use of aspirin: Secondary | ICD-10-CM | POA: Diagnosis not present

## 2023-11-17 DIAGNOSIS — J45909 Unspecified asthma, uncomplicated: Secondary | ICD-10-CM | POA: Diagnosis not present

## 2023-11-17 DIAGNOSIS — Z794 Long term (current) use of insulin: Secondary | ICD-10-CM | POA: Diagnosis not present

## 2023-11-17 DIAGNOSIS — Z96653 Presence of artificial knee joint, bilateral: Secondary | ICD-10-CM | POA: Diagnosis not present

## 2023-11-17 DIAGNOSIS — Z79891 Long term (current) use of opiate analgesic: Secondary | ICD-10-CM | POA: Diagnosis not present

## 2023-11-17 DIAGNOSIS — E1140 Type 2 diabetes mellitus with diabetic neuropathy, unspecified: Secondary | ICD-10-CM | POA: Diagnosis not present

## 2023-11-17 DIAGNOSIS — Z7985 Long-term (current) use of injectable non-insulin antidiabetic drugs: Secondary | ICD-10-CM | POA: Diagnosis not present

## 2023-11-17 DIAGNOSIS — Z87891 Personal history of nicotine dependence: Secondary | ICD-10-CM | POA: Diagnosis not present

## 2023-11-17 DIAGNOSIS — E785 Hyperlipidemia, unspecified: Secondary | ICD-10-CM | POA: Diagnosis not present

## 2023-11-17 DIAGNOSIS — M48062 Spinal stenosis, lumbar region with neurogenic claudication: Secondary | ICD-10-CM | POA: Diagnosis not present

## 2023-11-21 ENCOUNTER — Telehealth: Payer: Self-pay | Admitting: Orthopaedic Surgery

## 2023-11-21 ENCOUNTER — Telehealth: Payer: Self-pay

## 2023-11-21 ENCOUNTER — Other Ambulatory Visit: Payer: Self-pay | Admitting: Orthopaedic Surgery

## 2023-11-21 MED ORDER — OXYCODONE HCL 5 MG PO TABS: 5.0000 mg | ORAL_TABLET | Freq: Four times a day (QID) | ORAL | 0 refills | Status: DC | PRN

## 2023-11-21 NOTE — Telephone Encounter (Signed)
 Patient's daughter Lafonda Mosses called advised patient need Rx refilled Oxycodone.  Patient uses [facility] in [place]. The number to contact Lafonda Mosses is [phone]

## 2023-11-22 NOTE — Telephone Encounter (Signed)
 error

## 2023-11-26 DIAGNOSIS — K5903 Drug induced constipation: Secondary | ICD-10-CM | POA: Diagnosis not present

## 2023-11-26 DIAGNOSIS — K760 Fatty (change of) liver, not elsewhere classified: Secondary | ICD-10-CM | POA: Diagnosis not present

## 2023-11-26 DIAGNOSIS — R339 Retention of urine, unspecified: Secondary | ICD-10-CM | POA: Diagnosis not present

## 2023-11-26 DIAGNOSIS — K5730 Diverticulosis of large intestine without perforation or abscess without bleeding: Secondary | ICD-10-CM | POA: Diagnosis not present

## 2023-11-26 DIAGNOSIS — R1084 Generalized abdominal pain: Secondary | ICD-10-CM | POA: Diagnosis not present

## 2023-11-26 DIAGNOSIS — K5909 Other constipation: Secondary | ICD-10-CM | POA: Diagnosis not present

## 2023-11-26 DIAGNOSIS — R109 Unspecified abdominal pain: Secondary | ICD-10-CM | POA: Diagnosis not present

## 2023-11-28 ENCOUNTER — Ambulatory Visit (INDEPENDENT_AMBULATORY_CARE_PROVIDER_SITE_OTHER): Payer: Medicare Other | Admitting: Orthopaedic Surgery

## 2023-11-28 ENCOUNTER — Telehealth: Payer: Self-pay | Admitting: *Deleted

## 2023-11-28 ENCOUNTER — Encounter: Payer: Self-pay | Admitting: Orthopaedic Surgery

## 2023-11-28 DIAGNOSIS — Z96651 Presence of right artificial knee joint: Secondary | ICD-10-CM

## 2023-11-28 MED ORDER — GABAPENTIN 300 MG PO CAPS
300.0000 mg | ORAL_CAPSULE | Freq: Every day | ORAL | 0 refills | Status: DC
Start: 2023-11-28 — End: 2024-05-15

## 2023-11-28 MED ORDER — OXYCODONE HCL 5 MG PO TABS: 5.0000 mg | ORAL_TABLET | Freq: Four times a day (QID) | ORAL | 0 refills | Status: DC | PRN

## 2023-11-28 NOTE — Transitions of Care (Post Inpatient/ED Visit) (Signed)
   11/28/2023  Name: Samuel [name] MRN: [id number] DOB: August 11, 1951  Today's TOC FU Call Status: Today's TOC FU Call Status:: Unsuccessful Call (1st Attempt) Unsuccessful Call (1st Attempt) Date: 11/28/23  Attempted to reach the patient regarding the most recent Inpatient/ED visit.  Follow Up Plan: Additional outreach attempts will be made to reach the patient to complete the Transitions of Care (Post Inpatient/ED visit) call.   Signature Rylie Knierim, [facility]

## 2023-11-28 NOTE — Progress Notes (Signed)
The patient is here today for his first postoperative visit status post a right total knee arthroplasty.  We have replaced his left knee in the past.  He has been compliant with a baby aspirin twice daily.  He has a diabetic and reports his blood glucose has been running high and it sounds like he has not been taking some of his diabetic medications.  He is ambulating with a rolling walker.  He starts outpatient physical therapy tomorrow.  He does report some numbness and tingling in his right foot.  He does need a refill of oxycodone as well.  He does have an interpreter with him but he does speak broken [place] and has other family that does interpret as well.  His right knee does have bruising of the thigh to be expected from his tourniquet.  The knee itself has almost full extension and I can flex him to about 80 to 85 degrees.  The incision looks good.  Staples been removed and Steri-Strips applied.  His calf is soft.  He is able to flex and extend his foot.  Will try 300 mg of gabapentin just at bedtime.  I did refill his oxycodone.  He can stop his aspirin as well.  We will see him back in 4 weeks for repeat exam to see what his range of motion is like but no x-rays are needed.  All questions and concerns were addressed and answered.

## 2023-11-29 DIAGNOSIS — R262 Difficulty in walking, not elsewhere classified: Secondary | ICD-10-CM | POA: Diagnosis not present

## 2023-11-29 DIAGNOSIS — Z4789 Encounter for other orthopedic aftercare: Secondary | ICD-10-CM | POA: Diagnosis not present

## 2023-11-29 DIAGNOSIS — M25561 Pain in right knee: Secondary | ICD-10-CM | POA: Diagnosis not present

## 2023-11-29 DIAGNOSIS — M25661 Stiffness of right knee, not elsewhere classified: Secondary | ICD-10-CM | POA: Diagnosis not present

## 2023-12-01 DIAGNOSIS — R262 Difficulty in walking, not elsewhere classified: Secondary | ICD-10-CM | POA: Diagnosis not present

## 2023-12-01 DIAGNOSIS — Z4789 Encounter for other orthopedic aftercare: Secondary | ICD-10-CM | POA: Diagnosis not present

## 2023-12-01 DIAGNOSIS — M25661 Stiffness of right knee, not elsewhere classified: Secondary | ICD-10-CM | POA: Diagnosis not present

## 2023-12-01 DIAGNOSIS — M25561 Pain in right knee: Secondary | ICD-10-CM | POA: Diagnosis not present

## 2023-12-05 DIAGNOSIS — M25661 Stiffness of right knee, not elsewhere classified: Secondary | ICD-10-CM | POA: Diagnosis not present

## 2023-12-05 DIAGNOSIS — Z4789 Encounter for other orthopedic aftercare: Secondary | ICD-10-CM | POA: Diagnosis not present

## 2023-12-05 DIAGNOSIS — M25561 Pain in right knee: Secondary | ICD-10-CM | POA: Diagnosis not present

## 2023-12-05 DIAGNOSIS — R262 Difficulty in walking, not elsewhere classified: Secondary | ICD-10-CM | POA: Diagnosis not present

## 2023-12-08 DIAGNOSIS — Z4789 Encounter for other orthopedic aftercare: Secondary | ICD-10-CM | POA: Diagnosis not present

## 2023-12-08 DIAGNOSIS — M25661 Stiffness of right knee, not elsewhere classified: Secondary | ICD-10-CM | POA: Diagnosis not present

## 2023-12-08 DIAGNOSIS — R262 Difficulty in walking, not elsewhere classified: Secondary | ICD-10-CM | POA: Diagnosis not present

## 2023-12-08 DIAGNOSIS — M25561 Pain in right knee: Secondary | ICD-10-CM | POA: Diagnosis not present

## 2023-12-12 DIAGNOSIS — R262 Difficulty in walking, not elsewhere classified: Secondary | ICD-10-CM | POA: Diagnosis not present

## 2023-12-12 DIAGNOSIS — M25661 Stiffness of right knee, not elsewhere classified: Secondary | ICD-10-CM | POA: Diagnosis not present

## 2023-12-12 DIAGNOSIS — M25561 Pain in right knee: Secondary | ICD-10-CM | POA: Diagnosis not present

## 2023-12-12 DIAGNOSIS — Z4789 Encounter for other orthopedic aftercare: Secondary | ICD-10-CM | POA: Diagnosis not present

## 2023-12-15 DIAGNOSIS — Z4789 Encounter for other orthopedic aftercare: Secondary | ICD-10-CM | POA: Diagnosis not present

## 2023-12-15 DIAGNOSIS — R262 Difficulty in walking, not elsewhere classified: Secondary | ICD-10-CM | POA: Diagnosis not present

## 2023-12-15 DIAGNOSIS — M25561 Pain in right knee: Secondary | ICD-10-CM | POA: Diagnosis not present

## 2023-12-15 DIAGNOSIS — M25661 Stiffness of right knee, not elsewhere classified: Secondary | ICD-10-CM | POA: Diagnosis not present

## 2023-12-19 DIAGNOSIS — M25561 Pain in right knee: Secondary | ICD-10-CM | POA: Diagnosis not present

## 2023-12-19 DIAGNOSIS — M25661 Stiffness of right knee, not elsewhere classified: Secondary | ICD-10-CM | POA: Diagnosis not present

## 2023-12-19 DIAGNOSIS — R262 Difficulty in walking, not elsewhere classified: Secondary | ICD-10-CM | POA: Diagnosis not present

## 2023-12-19 DIAGNOSIS — Z4789 Encounter for other orthopedic aftercare: Secondary | ICD-10-CM | POA: Diagnosis not present

## 2023-12-21 DIAGNOSIS — R262 Difficulty in walking, not elsewhere classified: Secondary | ICD-10-CM | POA: Diagnosis not present

## 2023-12-21 DIAGNOSIS — Z4789 Encounter for other orthopedic aftercare: Secondary | ICD-10-CM | POA: Diagnosis not present

## 2023-12-21 DIAGNOSIS — M25561 Pain in right knee: Secondary | ICD-10-CM | POA: Diagnosis not present

## 2023-12-21 DIAGNOSIS — M25661 Stiffness of right knee, not elsewhere classified: Secondary | ICD-10-CM | POA: Diagnosis not present

## 2023-12-22 DIAGNOSIS — R262 Difficulty in walking, not elsewhere classified: Secondary | ICD-10-CM | POA: Diagnosis not present

## 2023-12-22 DIAGNOSIS — Z4789 Encounter for other orthopedic aftercare: Secondary | ICD-10-CM | POA: Diagnosis not present

## 2023-12-22 DIAGNOSIS — M25661 Stiffness of right knee, not elsewhere classified: Secondary | ICD-10-CM | POA: Diagnosis not present

## 2023-12-22 DIAGNOSIS — M25561 Pain in right knee: Secondary | ICD-10-CM | POA: Diagnosis not present

## 2023-12-26 ENCOUNTER — Telehealth: Payer: Self-pay | Admitting: Medical

## 2023-12-26 DIAGNOSIS — M25661 Stiffness of right knee, not elsewhere classified: Secondary | ICD-10-CM | POA: Diagnosis not present

## 2023-12-26 DIAGNOSIS — M25561 Pain in right knee: Secondary | ICD-10-CM | POA: Diagnosis not present

## 2023-12-26 DIAGNOSIS — Z4789 Encounter for other orthopedic aftercare: Secondary | ICD-10-CM | POA: Diagnosis not present

## 2023-12-26 DIAGNOSIS — R262 Difficulty in walking, not elsewhere classified: Secondary | ICD-10-CM | POA: Diagnosis not present

## 2023-12-26 NOTE — Telephone Encounter (Signed)
Pt came in office stating received a letter from Insurance needing provider to inform insurance that pt has any condition with [facility], copy of letter is dropped off for provider to see and provide the info for insurance to have so they can update on his insurance records. Please advise.

## 2023-12-26 NOTE — Telephone Encounter (Signed)
 noted

## 2023-12-28 ENCOUNTER — Other Ambulatory Visit: Payer: Self-pay | Admitting: Physician Assistant

## 2023-12-28 DIAGNOSIS — M25561 Pain in right knee: Secondary | ICD-10-CM | POA: Diagnosis not present

## 2023-12-28 DIAGNOSIS — Z4789 Encounter for other orthopedic aftercare: Secondary | ICD-10-CM | POA: Diagnosis not present

## 2023-12-28 DIAGNOSIS — R262 Difficulty in walking, not elsewhere classified: Secondary | ICD-10-CM | POA: Diagnosis not present

## 2023-12-28 DIAGNOSIS — M25661 Stiffness of right knee, not elsewhere classified: Secondary | ICD-10-CM | POA: Diagnosis not present

## 2023-12-29 DIAGNOSIS — M25561 Pain in right knee: Secondary | ICD-10-CM | POA: Diagnosis not present

## 2023-12-29 DIAGNOSIS — R262 Difficulty in walking, not elsewhere classified: Secondary | ICD-10-CM | POA: Diagnosis not present

## 2023-12-29 DIAGNOSIS — Z4789 Encounter for other orthopedic aftercare: Secondary | ICD-10-CM | POA: Diagnosis not present

## 2023-12-29 DIAGNOSIS — M25661 Stiffness of right knee, not elsewhere classified: Secondary | ICD-10-CM | POA: Diagnosis not present

## 2024-01-02 DIAGNOSIS — Z4789 Encounter for other orthopedic aftercare: Secondary | ICD-10-CM | POA: Diagnosis not present

## 2024-01-02 DIAGNOSIS — M25561 Pain in right knee: Secondary | ICD-10-CM | POA: Diagnosis not present

## 2024-01-02 DIAGNOSIS — R262 Difficulty in walking, not elsewhere classified: Secondary | ICD-10-CM | POA: Diagnosis not present

## 2024-01-02 DIAGNOSIS — M25661 Stiffness of right knee, not elsewhere classified: Secondary | ICD-10-CM | POA: Diagnosis not present

## 2024-01-04 ENCOUNTER — Encounter: Payer: Self-pay | Admitting: Orthopaedic Surgery

## 2024-01-04 ENCOUNTER — Ambulatory Visit: Payer: PPO | Admitting: Orthopaedic Surgery

## 2024-01-04 DIAGNOSIS — Z96651 Presence of right artificial knee joint: Secondary | ICD-10-CM

## 2024-01-04 MED ORDER — OXYCODONE HCL 5 MG PO TABS
5.0000 mg | ORAL_TABLET | Freq: Three times a day (TID) | ORAL | 0 refills | Status: DC | PRN
Start: 2024-01-04 — End: 2024-05-15

## 2024-01-04 NOTE — Progress Notes (Signed)
 The patient is now 8 weeks status post a right total knee replacement.  He does not speak [place] but does have an interpreter with him.  We replaced his left knee back in 2022.  He says he is making progress with his range of motion and strength.  He does need 1 refill of pain medication which she has been using sparingly.  He is a diabetic but reports good blood glucose control.  He is walking without an assistive device.  He is active 73 year old gentleman.  His left knee that we did 2002 she has no swelling and has full range of motion and is stable ligamentously.  The right knee still has swelling as expected but his range of motion is actually getting there in his past 90 degrees with flexion and his extension is full.  I did send in some more pain medication for him.  The next time we need to see him is not for 3 months unless he is having issues.  At the next visit we will have a standing AP and lateral of his more recent right operative knee.

## 2024-01-05 ENCOUNTER — Other Ambulatory Visit (HOSPITAL_COMMUNITY): Payer: Self-pay

## 2024-01-05 ENCOUNTER — Telehealth: Payer: Self-pay

## 2024-01-05 NOTE — Telephone Encounter (Signed)
 Pharmacy Patient Advocate Encounter   Received notification from Pt Calls Messages that prior authorization for Trulicity is required/requested.   Insurance verification completed.   The patient is insured through [facility] ADVANTAGE/RX ADVANCE .   Per test claim: PA required; PA submitted to above mentioned insurance via CoverMyMeds Key/confirmation #/EOC [id number] Status is pending   PA has been APPROVED through 01/04/25 PA Case ID #: [id number]

## 2024-01-05 NOTE — Telephone Encounter (Signed)
 PA needed for Trulicity

## 2024-01-06 DIAGNOSIS — M25661 Stiffness of right knee, not elsewhere classified: Secondary | ICD-10-CM | POA: Diagnosis not present

## 2024-01-06 DIAGNOSIS — R262 Difficulty in walking, not elsewhere classified: Secondary | ICD-10-CM | POA: Diagnosis not present

## 2024-01-06 DIAGNOSIS — M25561 Pain in right knee: Secondary | ICD-10-CM | POA: Diagnosis not present

## 2024-01-06 DIAGNOSIS — Z4789 Encounter for other orthopedic aftercare: Secondary | ICD-10-CM | POA: Diagnosis not present

## 2024-01-09 ENCOUNTER — Ambulatory Visit: Payer: Medicare Other | Admitting: Endocrinology

## 2024-01-09 DIAGNOSIS — M25561 Pain in right knee: Secondary | ICD-10-CM | POA: Diagnosis not present

## 2024-01-09 DIAGNOSIS — R262 Difficulty in walking, not elsewhere classified: Secondary | ICD-10-CM | POA: Diagnosis not present

## 2024-01-09 DIAGNOSIS — M25661 Stiffness of right knee, not elsewhere classified: Secondary | ICD-10-CM | POA: Diagnosis not present

## 2024-01-09 DIAGNOSIS — Z4789 Encounter for other orthopedic aftercare: Secondary | ICD-10-CM | POA: Diagnosis not present

## 2024-01-10 ENCOUNTER — Encounter: Payer: Self-pay | Admitting: Endocrinology

## 2024-01-10 ENCOUNTER — Ambulatory Visit (INDEPENDENT_AMBULATORY_CARE_PROVIDER_SITE_OTHER): Admitting: Endocrinology

## 2024-01-10 VITALS — BP 128/78 | HR 78 | Ht 64.0 in | Wt 191.0 lb

## 2024-01-10 DIAGNOSIS — E1165 Type 2 diabetes mellitus with hyperglycemia: Secondary | ICD-10-CM | POA: Diagnosis not present

## 2024-01-10 DIAGNOSIS — Z794 Long term (current) use of insulin: Secondary | ICD-10-CM

## 2024-01-10 LAB — POCT GLYCOSYLATED HEMOGLOBIN (HGB A1C): Hemoglobin A1C: 7.1 % — AB (ref 4.0–5.6)

## 2024-01-10 MED ORDER — SYNJARDY XR 5-1000 MG PO TB24: 2.0000 | ORAL_TABLET | Freq: Every day | ORAL | 3 refills | Status: AC

## 2024-01-10 MED ORDER — TRULICITY 4.5 MG/0.5ML ~~LOC~~ SOAJ: 4.5000 mg | SUBCUTANEOUS | 3 refills | Status: AC

## 2024-01-10 NOTE — Patient Instructions (Signed)
 Latest Reference Range & Units 08/22/23 08:37 10/10/23 13:41 01/10/24 08:17  Hemoglobin A1C 4.0 - 5.6 % 7.8 (H) 6.9 ! Pend 7.1 !  (H): Data is abnormally high !: Data is abnormal  No change on diabetes medicines.

## 2024-01-10 NOTE — Progress Notes (Signed)
 Outpatient Endocrinology Note [place] Nobel Brar, MD  01/10/24  Patient Name: Samuel [name]   DOB : 29-Aug-1951 MRN # [id number]                                                    REASON OF VISIT: Follow up for type 2 diabetes mellitus  PCP: Esperanza Richters, PA-C  HISTORY OF PRESENT ILLNESS:   Samuel [name] is a 73 y.o. old male with past medical history listed below, is here for follow up for type 2 diabetes mellitus.   Pertinent Diabetes History:  _Diagnosed as type 2 in 1990.  Patient was initially treated with metformin and later insulin added.  He was mostly on insulin 70/30 regimen in the past.  At some point he was also on basal bolus insulin with Toujeo and Humalog.    OmniPod Dash insulin pump started in 2018/2019.  Chronic Diabetes Complications : Retinopathy: no. Last ophthalmology exam was done on annually, reportedly. Nephropathy: no, on losartan Peripheral neuropathy: no Coronary artery disease: no Stroke: no  Relevant comorbidities and cardiovascular risk factors: Obesity: yes Body mass index is 32.79 kg/m.  Hypertension: Yes Hyperlipidemia.  Yes, on statin.  Current / Home Diabetic regimen includes: Omnipod 4 / Dash /not with sensor.  He uses Humalog U100.  Synjardy 03-999 mg 2 tab daily.  Trulicity 4.5mg  weekly.  Insulin Pump setting:  Basal ( 30. 75 units/day) MN-    0.9 u/hour 2:30AM- 1.0  8AM-    1.5   10 PM- 1.0       Bolus CHO Ratio (1unit:CHO) MN- 1:1 ( using fixed manual bolus 8-16 units)  Correction/Sensitivity: MN- 1:25  Target: 130   Active insulin time: 4 hours  Prior diabetic medications: Insulin 70/30, Toujeo  INSULIN PUMP DATA:                         Average total daily insulin:43.8  units, Basal: 38%, Bolus: 62 %  He does not use sensor with OmniPod.  He does not do carb counting.  He uses fixed dose bolus from 8 to 18 units depending upon the blood sugar and food size with meals, 1-4 times a day.   Glycemic data:     CONTINUOUS GLUCOSE MONITORING SYSTEM (CGMS) INTERPRETATION:  FreeStyle Libre 2 CGM-  Sensor Download (Sensor download was reviewed and summarized below.) Dates: February 19 to January 10, 2024, 14 days. Sensor Average: 142  Glucose Management Indicator: 6.7%  % data captured: 87%    Interpretation: Mostly acceptable blood sugar.  Rare mild hypoglycemia with blood sugar in upper 60s postprandially related to use of meal bolus.  Mild random hyperglycemia with blood sugar up to 200s.  No concerning hypo or hyperglycemia.  Mostly acceptable blood sugar overnight and in between the meals.  Insulin pump download reviewed.  Hypoglycemia: Patient has no concerning hypoglycemic episodes. Patient has hypoglycemia awareness.    Factors modifying glucose control: 1.  Diabetic diet assessment: Almost always eats a bedtime snack.  Supper around 6 PM.  2.  Staying active or exercising: Started to walk.  3.  Medication compliance: compliant all of the time.  Interval history CGM and pump data average reviewed above.  Pump data reviewed.  GMI 6.7%.  Hemoglobin A1c today 7.1%.  Mostly acceptable blood sugar.  He had a right knee surgery and well-healed.  He has no other complaints today.  He ran out of the [place] and has not been taking for about a week.  He reports compliance with Trulicity taking weekly and denies any side effects.  Spanish language interpretation video used in the clinic visit.  REVIEW OF SYSTEMS As per history of present illness.   PAST MEDICAL HISTORY: Past Medical History:  Diagnosis Date   Arthritis    Blood transfusion without reported diagnosis    Diabetes mellitus without complication (HCC)    type II   History of ETOH abuse    Hypertension     PAST SURGICAL HISTORY: Past Surgical History:  Procedure Laterality Date   BACK SURGERY N/A 2010   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   Procedure: LEFT TOTAL KNEE ARTHROPLASTY;  Surgeon: Kathryne Hitch, MD;  Location: [facility];  Service: Orthopedics;  Laterality: Left;   TOTAL KNEE ARTHROPLASTY Right 11/15/2023   Procedure: RIGHT TOTAL KNEE ARTHROPLASTY;  Surgeon: Kathryne Hitch, MD;  Location: [facility];  Service: Orthopedics;  Laterality: Right;    ALLERGIES: No Known Allergies  FAMILY HISTORY:  Family History  Problem Relation Age of Onset   Diabetes Unknown    Hypertension Unknown    Arthritis Father    Diabetes Father     SOCIAL HISTORY: Social History   Socioeconomic History   Marital status: Married    Spouse name: Not on file   Number of children: Not on file   Years of education: Not on file   Highest education level: 3rd grade  Occupational History   Not on file  Tobacco Use   Smoking status: Former   Smokeless tobacco: Former    Quit date: 11/08/1993  Vaping Use   Vaping status: Former  Substance and Sexual Activity   Alcohol use: No   Drug use: No   Sexual activity: Not on file  Other Topics Concern   Not on file  Social History Narrative   Not on file   Social Drivers of Health   Financial Resource Strain: Low Risk  (06/21/2023)   Overall Financial Resource Strain (CARDIA)    Difficulty of Paying Living Expenses: Not hard at all  Food Insecurity: No Food Insecurity (06/21/2023)   Hunger Vital Sign    Worried About Running Out of Food in the Last Year: Never true    Ran Out of Food in the Last Year: Never true  Transportation Needs: No Transportation Needs (06/21/2023)   PRAPARE - Administrator, Civil Service (Medical): No    Lack of Transportation (Non-Medical): No  Physical Activity: Insufficiently Active (06/21/2023)   Exercise Vital Sign    Days of Exercise per Week: 2 days    Minutes of Exercise per Session: 20 min  Stress: No Stress Concern Present (06/21/2023)   [employer] of Occupational Health - Occupational Stress Questionnaire    Feeling of Stress : Not at all  Social Connections: Socially Integrated (06/21/2023)    Social Connection and Isolation Panel [NHANES]    Frequency of Communication with Friends and Family: More than three times a week    Frequency of Social Gatherings with Friends and Family: Twice a week    Attends Religious Services: 1 to 4 times per year    Active Member of [employer] or Organizations: Yes    Attends Banker Meetings: 1 to 4 times per year    Marital Status: Married  MEDICATIONS:  Current Outpatient Medications  Medication Sig Dispense Refill   aspirin 81 MG chewable tablet Chew 1 tablet (81 mg total) by mouth 2 (two) times daily. 35 tablet 0   atorvastatin (LIPITOR) 40 MG tablet TAKE 1 TABLET BY MOUTH EVERY MORNING AND 1/2 AT BEDTIME 135 tablet 1   B-D UF III MINI PEN NEEDLES 31G X 5 MM MISC USE FIVE PER DAY AS DIRECTED 200 each 0   Continuous Glucose Sensor (FREESTYLE LIBRE 2 SENSOR) MISC 2 Devices by Does not apply route every 14 (fourteen) days. 2 each 3   fluticasone (FLONASE) 50 MCG/ACT nasal spray Place 2 sprays into both nostrils daily. (Patient taking differently: Place 2 sprays into both nostrils daily as needed for allergies.) 16 g 1   gabapentin (NEURONTIN) 300 MG capsule Take 1 capsule (300 mg total) by mouth at bedtime. 30 capsule 0   Glucagon (GVOKE HYPOPEN 1-PACK) 1 MG/0.2ML SOAJ Inject 1 mg into the skin as needed (low blood sugar with impaired consciousness). 0.4 mL 2   glucose blood (FREESTYLE LITE) test strip USE AS INSTRUCTED FOUR TIMES DAILY 150 strip 2   Insulin Disposable Pump (OMNIPOD DASH INTRO, GEN 4,) KIT Change every 3 days 1 kit 0   Insulin Disposable Pump (OMNIPOD DASH PODS, GEN 4,) MISC USE FOR INSULIN PUMP EVEY 3 DAYS 30 each 5   insulin lispro (HUMALOG) 100 UNIT/ML injection Max daily 125 units per pump 100 mL 3   Insulin Pen Needle 32G X 4 MM MISC Use 5 pens per day to inject insulin 200 each 3   Insulin Syringe-Needle U-100 (INSULIN SYRINGE .5CC/30GX5/16") 30G X 5/16" 0.5 ML MISC Use 3 times a day for insulin 300 each 1    levocetirizine (XYZAL) 5 MG tablet Take 1 tablet (5 mg total) by mouth every evening. 30 tablet 3   losartan (COZAAR) 50 MG tablet TAKE 1 TABLET(50 MG) BY MOUTH DAILY 90 tablet 1   methocarbamol (ROBAXIN) 500 MG tablet Take 1 tablet (500 mg total) by mouth every 6 (six) hours as needed for muscle spasms. 40 tablet 1   OVER THE COUNTER MEDICATION Take 2 tablets by mouth daily. 4LIFE TRANSFER FACTORY RECALL     oxyCODONE (OXY IR/ROXICODONE) 5 MG immediate release tablet Take 1 tablet (5 mg total) by mouth 3 (three) times daily as needed for moderate pain (pain score 4-6) (pain score 4-6). 30 tablet 0   Dulaglutide (TRULICITY) 4.5 MG/0.5ML SOAJ Inject 4.5 mg as directed once a week. 6 mL 3   Empagliflozin-metFORMIN HCl ER (SYNJARDY XR) 03-999 MG TB24 Take 2 tablets by mouth daily. 180 tablet 3   No current facility-administered medications for this visit.    PHYSICAL EXAM: Vitals:   01/10/24 0807  BP: 128/78  Pulse: 78  SpO2: 98%  Weight: 191 lb (86.6 kg)  Height: 5\' 4"  (1.626 m)     Body mass index is 32.79 kg/m.  Wt Readings from Last 3 Encounters:  01/10/24 191 lb (86.6 kg)  11/15/23 206 lb (93.4 kg)  11/03/23 201 lb 6.4 oz (91.4 kg)    General: Well developed, well nourished male in no apparent distress.  HEENT: AT/[place], no external lesions.  Eyes: Conjunctiva clear and no icterus. Neck: Neck supple  Lungs: Respirations not labored Neurologic: Alert, oriented, normal speech Extremities / Skin: Dry.   Psychiatric: Does not appear depressed or anxious  Diabetic Foot Exam - Simple   Simple Foot Form Diabetic Foot exam was performed with the following findings:  Yes 01/10/2024  8:25 AM  Visual Inspection No deformities, no ulcerations, no other skin breakdown bilaterally: Yes Sensation Testing See comments: Yes Pulse Check Posterior Tibialis and Dorsalis pulse intact bilaterally: Yes Comments Monofilament exam mildly diminished bilaterally.     LABS Reviewed Lab Results   Component Value Date   HGBA1C 7.1 (A) 01/10/2024   HGBA1C 6.9 (A) 10/10/2023   HGBA1C 7.8 (H) 08/22/2023   Lab Results  Component Value Date   FRUCTOSAMINE 302 (H) 07/20/2017   Lab Results  Component Value Date   CHOL 84 08/15/2023   HDL 48.70 08/15/2023   LDLCALC 14 08/15/2023   LDLDIRECT 44.0 01/24/2020   TRIG 107.0 08/15/2023   CHOLHDL 2 08/15/2023   Lab Results  Component Value Date   MICRALBCREAT 1.5 06/15/2023   MICRALBCREAT 0.7 05/24/2022   Lab Results  Component Value Date   CREATININE 0.95 11/16/2023   Lab Results  Component Value Date   GFR 86.83 06/22/2023    ASSESSMENT / PLAN  1. Type 2 diabetes mellitus with hyperglycemia, with long-term current use of insulin (HCC)   2. Uncontrolled type 2 diabetes mellitus with hyperglycemia, with long-term current use of insulin (HCC)    Samuel [name] was seen today for follow-up.  Diagnoses and all orders for this visit:  Type 2 diabetes mellitus with hyperglycemia, with long-term current use of insulin (HCC) -     POCT glycosylated hemoglobin (Hb A1C) -     Dulaglutide (TRULICITY) 4.5 MG/0.5ML SOAJ; Inject 4.5 mg as directed once a week.  Uncontrolled type 2 diabetes mellitus with hyperglycemia, with long-term current use of insulin (HCC) -     Empagliflozin-metFORMIN HCl ER (SYNJARDY XR) 03-999 MG TB24; Take 2 tablets by mouth daily.   Diabetes Mellitus type 2  - Diabetic status / severity: Controlled  Lab Results  Component Value Date   HGBA1C 7.1 (A) 01/10/2024    - Hemoglobin A1c goal < 7-7.5% range.   He has improvement on diabetes control, hemoglobin A1c today 6.9% improved from 7.8% 2 months ago.  - Medications:   Discussed about adjusting fixed bolus of insulin for his meals 8 to 16 units based on meal size and blood sugar normal at that time. He does not carb count.  Advised to take bolus insulin less by 2 units when he has increased physical activity for example walking.  -Continue Synjardy 03-999  mg 2 tab daily.   -Continue Trulicity 4.5 mg weekly.  - Home glucose testing: continue CGM and check blood glucose as needed.   - Discussed/ Gave Hypoglycemia treatment plan.  # Consult : not required at this time.   # Annual urine for microalbuminuria/ creatinine ratio, no microalbuminuria currently, continue losartan.  Last  Lab Results  Component Value Date   MICRALBCREAT 1.5 06/15/2023    # Foot check nightly.  # Annual dilated diabetic eye exams.   - Diet: Make healthy diabetic food choices, advised to avoid high carbohydrate meal and sweets and sugary meals. - Life style / activity / exercise: Discussed.  Advised for walking and increase physical activity.  Blood pressure  -  BP Readings from Last 1 Encounters:  01/10/24 128/78    - Control is in target.  - No change in current plans.  Lipid status / Hyperlipidemia - Last  Lab Results  Component Value Date   LDLCALC 14 08/15/2023   - Continue atorvastatin 40 mg daily.   DISPOSITION Follow up in clinic in 3 months suggested.   All  questions answered and patient verbalized understanding of the plan.  [place] Kemberly Taves, MD [facility] Endocrinology [facility] [address] [place], [address] [place], [place] [address] Phone # [phone]  At least part of this note was generated using voice recognition software. Inadvertent word errors may have occurred, which were not recognized during the proofreading process.

## 2024-01-11 ENCOUNTER — Encounter: Payer: Self-pay | Admitting: Endocrinology

## 2024-01-11 DIAGNOSIS — M25561 Pain in right knee: Secondary | ICD-10-CM | POA: Diagnosis not present

## 2024-01-11 DIAGNOSIS — Z4789 Encounter for other orthopedic aftercare: Secondary | ICD-10-CM | POA: Diagnosis not present

## 2024-01-11 DIAGNOSIS — R262 Difficulty in walking, not elsewhere classified: Secondary | ICD-10-CM | POA: Diagnosis not present

## 2024-01-11 DIAGNOSIS — M25661 Stiffness of right knee, not elsewhere classified: Secondary | ICD-10-CM | POA: Diagnosis not present

## 2024-01-16 DIAGNOSIS — R262 Difficulty in walking, not elsewhere classified: Secondary | ICD-10-CM | POA: Diagnosis not present

## 2024-01-16 DIAGNOSIS — Z4789 Encounter for other orthopedic aftercare: Secondary | ICD-10-CM | POA: Diagnosis not present

## 2024-01-16 DIAGNOSIS — M25561 Pain in right knee: Secondary | ICD-10-CM | POA: Diagnosis not present

## 2024-01-16 DIAGNOSIS — M25661 Stiffness of right knee, not elsewhere classified: Secondary | ICD-10-CM | POA: Diagnosis not present

## 2024-01-18 DIAGNOSIS — Z4789 Encounter for other orthopedic aftercare: Secondary | ICD-10-CM | POA: Diagnosis not present

## 2024-01-18 DIAGNOSIS — M25661 Stiffness of right knee, not elsewhere classified: Secondary | ICD-10-CM | POA: Diagnosis not present

## 2024-01-18 DIAGNOSIS — M25561 Pain in right knee: Secondary | ICD-10-CM | POA: Diagnosis not present

## 2024-01-18 DIAGNOSIS — R262 Difficulty in walking, not elsewhere classified: Secondary | ICD-10-CM | POA: Diagnosis not present

## 2024-02-01 ENCOUNTER — Other Ambulatory Visit: Payer: Self-pay

## 2024-02-01 DIAGNOSIS — E1165 Type 2 diabetes mellitus with hyperglycemia: Secondary | ICD-10-CM

## 2024-02-01 MED ORDER — FREESTYLE LIBRE 2 SENSOR MISC: 2.0000 | 3 refills | Status: DC

## 2024-02-03 ENCOUNTER — Other Ambulatory Visit: Payer: Self-pay | Admitting: Medical

## 2024-02-06 ENCOUNTER — Telehealth: Payer: Self-pay

## 2024-02-06 DIAGNOSIS — Z794 Long term (current) use of insulin: Secondary | ICD-10-CM

## 2024-02-06 MED ORDER — OMNIPOD DASH PODS (GEN 4) MISC: 5 refills | Status: AC

## 2024-02-06 NOTE — Telephone Encounter (Signed)
 Requested Prescriptions   Signed Prescriptions Disp Refills   Insulin Disposable Pump (OMNIPOD DASH PODS, GEN 4,) MISC 30 each 5    Sig: USE FOR INSULIN PUMP EVEY 3 DAYS    Authorizing Provider: THAPA, [place]    Ordering User: Pollie Meyer

## 2024-02-13 ENCOUNTER — Encounter: Payer: Self-pay | Admitting: Endocrinology

## 2024-02-13 DIAGNOSIS — E1165 Type 2 diabetes mellitus with hyperglycemia: Secondary | ICD-10-CM

## 2024-02-14 ENCOUNTER — Other Ambulatory Visit: Payer: Self-pay

## 2024-02-14 DIAGNOSIS — E1165 Type 2 diabetes mellitus with hyperglycemia: Secondary | ICD-10-CM

## 2024-02-14 MED ORDER — FREESTYLE LIBRE 2 SENSOR MISC: 2.0000 | 3 refills | Status: DC

## 2024-02-16 ENCOUNTER — Other Ambulatory Visit: Payer: Self-pay

## 2024-02-16 DIAGNOSIS — E1165 Type 2 diabetes mellitus with hyperglycemia: Secondary | ICD-10-CM

## 2024-02-16 MED ORDER — FREESTYLE LIBRE 2 READER DEVI: 0 refills | Status: AC

## 2024-02-22 ENCOUNTER — Other Ambulatory Visit: Payer: Self-pay

## 2024-02-22 MED ORDER — ONETOUCH VERIO W/DEVICE KIT: PACK | 0 refills | Status: DC

## 2024-02-27 ENCOUNTER — Other Ambulatory Visit: Payer: Self-pay

## 2024-02-27 DIAGNOSIS — E1165 Type 2 diabetes mellitus with hyperglycemia: Secondary | ICD-10-CM

## 2024-02-27 MED ORDER — GLUCOSE BLOOD VI STRP: ORAL_STRIP | 12 refills | Status: AC

## 2024-03-02 ENCOUNTER — Other Ambulatory Visit: Payer: Self-pay | Admitting: Pharmacist

## 2024-03-02 ENCOUNTER — Telehealth: Payer: Self-pay | Admitting: Pharmacist

## 2024-03-02 NOTE — Progress Notes (Signed)
 Pharmacy Quality Measure Review  This patient was noted to have failed medication adherence for cholesterol and hypertensive medications in 2024.  Reviewed his 2025 refill history.  Notice that Rx for atorvastatin  was sent to his pharmacy 02/06/2024 but had not been filled yet.   He has filled losartan  50mg  for 90 day supply on 01/02/2024.    Contacted pharmacy to facilitate refills. Per [facility] they needed to verify his insurance. Provided information for HTA plan. Per pharamcy technician she was not able to fill the Rx based on the quantity. Forwarded me to a pharmacist to help with processing claim but after waiting for 30 minutes, call was dropped. I called back and LM with pharmacy / [facility] requesting that they try to reprocess using #135 tabs with day supply of 90 days since patient takes atorvastatin  40mg  1 tablet in the morning and 1/2 tablet in the evening.  Asked that they call med at [phone] if they continue to have problems with processing atorvastatin .    Cecilie Coffee, PharmD Clinical Pharmacist [facility]  Population Health [phone]

## 2024-04-05 ENCOUNTER — Other Ambulatory Visit: Payer: Self-pay | Admitting: Endocrinology

## 2024-04-05 DIAGNOSIS — E1165 Type 2 diabetes mellitus with hyperglycemia: Secondary | ICD-10-CM

## 2024-04-12 ENCOUNTER — Ambulatory Visit: Admitting: Endocrinology

## 2024-04-16 ENCOUNTER — Encounter: Payer: Self-pay | Admitting: Medical

## 2024-04-16 ENCOUNTER — Ambulatory Visit (INDEPENDENT_AMBULATORY_CARE_PROVIDER_SITE_OTHER): Admitting: Medical

## 2024-04-16 VITALS — BP 120/60 | HR 70 | Temp 98.3°F | Resp 18 | Ht 64.0 in | Wt 194.0 lb

## 2024-04-16 DIAGNOSIS — R251 Tremor, unspecified: Secondary | ICD-10-CM

## 2024-04-16 DIAGNOSIS — E119 Type 2 diabetes mellitus without complications: Secondary | ICD-10-CM | POA: Diagnosis not present

## 2024-04-16 DIAGNOSIS — I10 Essential (primary) hypertension: Secondary | ICD-10-CM | POA: Diagnosis not present

## 2024-04-16 DIAGNOSIS — J309 Allergic rhinitis, unspecified: Secondary | ICD-10-CM

## 2024-04-16 MED ORDER — LEVOCETIRIZINE DIHYDROCHLORIDE 5 MG PO TABS: 5.0000 mg | ORAL_TABLET | Freq: Every evening | ORAL | 3 refills | Status: AC

## 2024-04-16 NOTE — Progress Notes (Signed)
 Subjective:    Patient ID: Samuel [name], male    DOB: 1951/05/30, 73 y.o.   MRN: [id number]  HPI  Discussed the use of AI scribe software for clinical note transcription with the patient, who gave verbal consent to proceed.  History of Present Illness   Samuel [name] is a 73 year old male who presents with symptoms suggestive of allergic rhinitis.  He has been experiencing a runny nose with clear discharge and occasional sneezing for less than a month. Symptoms have somewhat improved today. No watery eyes, but mild itching in the eyes is present. He previously used Xyzal , a 5 mg antihistamine, but stopped taking it about two months ago. He dislikes using Flonase  nasal spray due to discomfort/taste and prefers oral medication.  He describes experiencing tremors, particularly on the left side, which have been gradually worsening over the past three years. The tremors are more pronounced when he is emotional and sometimes require assistance with tasks. He has not yet seen a neurologist for this issue.  He has a history of elevated blood pressure, which was noted to be slightly high today but better on recheck. He does not regularly monitor his blood pressure at home but recalls having a machine available.  He is interested in receiving the Shingrix vaccine series. He also mentions a pending appointment with an endocrinologist for diabetes management and a diabetic eye exam.        Review of Systems  Constitutional:  Negative for chills and fatigue.  HENT:  Positive for congestion, postnasal drip, rhinorrhea and sneezing.   Respiratory:  Negative for cough and wheezing.   Cardiovascular:  Negative for chest pain and palpitations.  Gastrointestinal:  Negative for abdominal pain.  Genitourinary:  Negative for dysuria, flank pain and frequency.  Neurological:  Positive for tremors. Negative for dizziness, facial asymmetry, speech difficulty, weakness and light-headedness.       Normal  facial expressions. No obvous shuffled gain. No fall.s   Past Medical History:  Diagnosis Date   Arthritis    Blood transfusion without reported diagnosis    Diabetes mellitus without complication (HCC)    type II   History of ETOH abuse    Hypertension      Social History   Socioeconomic History   Marital status: Married    Spouse name: Not on file   Number of children: Not on file   Years of education: Not on file   Highest education level: 3rd grade  Occupational History   Not on file  Tobacco Use   Smoking status: Former   Smokeless tobacco: Former    Quit date: 11/08/1993  Vaping Use   Vaping status: Former  Substance and Sexual Activity   Alcohol use: No   Drug use: No   Sexual activity: Not on file  Other Topics Concern   Not on file  Social History Narrative   Not on file   Social Drivers of Health   Financial Resource Strain: Low Risk  (06/21/2023)   Overall Financial Resource Strain (CARDIA)    Difficulty of Paying Living Expenses: Not hard at all  Food Insecurity: No Food Insecurity (06/21/2023)   Hunger Vital Sign    Worried About Running Out of Food in the Last Year: Never true    Ran Out of Food in the Last Year: Never true  Transportation Needs: No Transportation Needs (06/21/2023)   PRAPARE - Administrator, Civil Service (Medical): No  Lack of Transportation (Non-Medical): No  Physical Activity: Insufficiently Active (06/21/2023)   Exercise Vital Sign    Days of Exercise per Week: 2 days    Minutes of Exercise per Session: 20 min  Stress: No Stress Concern Present (06/21/2023)   [employer] of Occupational Health - Occupational Stress Questionnaire    Feeling of Stress : Not at all  Social Connections: Socially Integrated (06/21/2023)   Social Connection and Isolation Panel [NHANES]    Frequency of Communication with Friends and Family: More than three times a week    Frequency of Social Gatherings with Friends and Family: Twice  a week    Attends Religious Services: 1 to 4 times per year    Active Member of [employer] or Organizations: Yes    Attends Banker Meetings: 1 to 4 times per year    Marital Status: Married  Catering manager Violence: Unknown (02/08/2022)   Received from [employer], [facility]   HITS    Physically Hurt: Not on file    Insult or Talk Down To: Not on file    Threaten Physical Harm: Not on file    Scream or Curse: Not on file    Past Surgical History:  Procedure Laterality Date   BACK SURGERY N/A 2010   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   Procedure: LEFT TOTAL KNEE ARTHROPLASTY;  Surgeon: Arnie Lao, MD;  Location: [facility];  Service: Orthopedics;  Laterality: Left;   TOTAL KNEE ARTHROPLASTY Right 11/15/2023   Procedure: RIGHT TOTAL KNEE ARTHROPLASTY;  Surgeon: Arnie Lao, MD;  Location: [facility];  Service: Orthopedics;  Laterality: Right;    Family History  Problem Relation Age of Onset   Diabetes Unknown    Hypertension Unknown    Arthritis Father    Diabetes Father     No Known Allergies  Current Outpatient Medications on File Prior to Visit  Medication Sig Dispense Refill   aspirin  81 MG chewable tablet Chew 1 tablet (81 mg total) by mouth 2 (two) times daily. 35 tablet 0   atorvastatin  (LIPITOR) 40 MG tablet TAKE 1 TABLET BY MOUTH EVERY MORNING AND 1/2 AT BEDTIME 135 tablet 1   B-D UF III MINI PEN NEEDLES 31G X 5 MM MISC USE FIVE PER DAY AS DIRECTED 200 each 0   Blood Glucose Monitoring Suppl (ONETOUCH VERIO REFLECT) w/Device KIT PLEASE USE WITH GLUCOMETER MACHINE TO CHECK BLOOD GLUCOSE LEVELS AS NEEDED, FOR BACKUP OF CGM 1 kit 0   Continuous Glucose Receiver (FREESTYLE LIBRE 2 READER) DEVI Use with sensor to track blood glucose 1 each 0   Continuous Glucose Sensor (FREESTYLE LIBRE 2 SENSOR) MISC 2 Devices by Does not apply route every 14 (fourteen) days. 6 each 3   Dulaglutide  (TRULICITY ) 4.5 MG/0.5ML SOAJ Inject 4.5 mg as directed once  a week. 6 mL 3   Empagliflozin -metFORMIN  HCl ER (SYNJARDY  XR) 03-999 MG TB24 Take 2 tablets by mouth daily. 180 tablet 3   fluticasone  (FLONASE ) 50 MCG/ACT nasal spray Place 2 sprays into both nostrils daily. (Patient taking differently: Place 2 sprays into both nostrils daily as needed for allergies.) 16 g 1   gabapentin  (NEURONTIN ) 300 MG capsule Take 1 capsule (300 mg total) by mouth at bedtime. 30 capsule 0   Glucagon  (GVOKE HYPOPEN  1-PACK) 1 MG/0.2ML SOAJ Inject 1 mg into the skin as needed (low blood sugar with impaired consciousness). 0.4 mL 2   glucose blood (FREESTYLE LITE) test strip USE AS INSTRUCTED FOUR TIMES  DAILY 150 strip 2   glucose blood test strip Use as instructed 100 each 12   Insulin  Disposable Pump (OMNIPOD DASH INTRO, GEN 4,) KIT Change every 3 days 1 kit 0   Insulin  Disposable Pump (OMNIPOD DASH PODS, GEN 4,) MISC USE FOR INSULIN  PUMP EVEY 3 DAYS 30 each 5   insulin  lispro (HUMALOG ) 100 UNIT/ML injection Max daily 125 units per pump 100 mL 3   Insulin  Pen Needle 32G X 4 MM MISC Use 5 pens per day to inject insulin  200 each 3   Insulin  Syringe-Needle U-100 (INSULIN  SYRINGE .5CC/30GX5/16") 30G X 5/16" 0.5 ML MISC Use 3 times a day for insulin  300 each 1   levocetirizine (XYZAL ) 5 MG tablet Take 1 tablet (5 mg total) by mouth every evening. 30 tablet 3   losartan  (COZAAR ) 50 MG tablet TAKE 1 TABLET(50 MG) BY MOUTH DAILY 90 tablet 1   methocarbamol  (ROBAXIN ) 500 MG tablet Take 1 tablet (500 mg total) by mouth every 6 (six) hours as needed for muscle spasms. 40 tablet 1   OVER THE COUNTER MEDICATION Take 2 tablets by mouth daily. 4LIFE TRANSFER FACTORY RECALL     oxyCODONE  (OXY IR/ROXICODONE ) 5 MG immediate release tablet Take 1 tablet (5 mg total) by mouth 3 (three) times daily as needed for moderate pain (pain score 4-6) (pain score 4-6). 30 tablet 0   No current facility-administered medications on file prior to visit.    BP 120/60   Pulse 70   Temp 98.3 F (36.8 C)    Resp 18   Ht 5\' 4"  (1.626 m)   Wt 194 lb (88 kg)   SpO2 98%   BMI 33.30 kg/m        Objective:   Physical Exam  General Mental Status- Alert. General Appearance- Not in acute distress.   Skin General: Color- Normal Color. Moisture- Normal Moisture.  Neck Carotid Arteries- Normal color. Moisture- Normal Moisture. No carotid bruits. No JVD.  Chest and Lung Exam Auscultation: Breath Sounds:-Normal.  Cardiovascular Auscultation:Rythm- Regular. Murmurs & Other Heart Sounds:Auscultation of the heart reveals- No Murmurs.  Abdomen Inspection:-Inspeection Normal. Palpation/Percussion:Note:No mass. Palpation and Percussion of the abdomen reveal- Non Tender, Non Distended + BS, no rebound or guarding.    Neurologic Cranial Nerve exam:- CN III-XII intact(No nystagmus), symmetric smile. Normal facial exprsion Finger to Nose:- Normal/Intact Strength:- 5/5 equal and symmetric strength both upper and lower extremities. No rigidiy of upper extremities.  Subtle rt upper ext resting treamor. Left upper ext obvious moderate resting tremor.      Assessment & Plan:   Patient Instructions  Allergic Rhinitis Symptoms consistent with allergic rhinitis. Prefers oral medication over nasal spray. - Prescribe Xyzal  5 mg daily. - Consider adding montelukast if Xyzal  is insufficient.  Tremors Tremors more pronounced on the left side, differential includes essential tremor and Parkinson's disease. - Refer to neurology for evaluation.  Hypertension Blood pressure slightly elevated. Home monitoring recommended for better management. - Continue losartan . - Recommend home blood pressure monitoring 2-3 times per week. - Consider purchasing a home blood pressure monitor if needed.  Diabetes Mellitus Scheduled endocrinology appointment. Metabolic panel needed to assess kidney function and potassium. - Attend endocrinology appointment. - Order metabolic panel including kidney function and  potassium levels.  General Health Maintenance Due for Shingrix vaccine. Medicare recommends pharmacy administration. - Advise to receive Shingrix vaccine at a pharmacy. - Ensure pharmacy sends vaccination report.   Below is where you got last diabetic eye exam. I placed  referral today but you can also call them directly.  [facility] - [facility] [address] [place], [place] [address] [phone]  Dorrie Gaudier, MD [address] [place], [place] [address] [phone] (Work) [phone] (Fax)      Samuel Everts, PA-C

## 2024-04-16 NOTE — Patient Instructions (Addendum)
 Allergic Rhinitis Symptoms consistent with allergic rhinitis. Prefers oral medication over nasal spray. - Prescribe Xyzal  5 mg daily. - Consider adding montelukast if Xyzal  is insufficient.  Tremors Tremors more pronounced on the left side, differential includes essential tremor and Parkinson's disease. - Refer to neurology for evaluation.  Hypertension Blood pressure slightly elevated. Home monitoring recommended for better management. - Continue losartan . - Recommend home blood pressure monitoring 2-3 times per week. - Consider purchasing a home blood pressure monitor if needed.  Diabetes Mellitus Scheduled endocrinology appointment. Metabolic panel needed to assess kidney function and potassium. - Attend endocrinology appointment. - Order metabolic panel including kidney function and potassium levels.  General Health Maintenance Due for Shingrix vaccine. Medicare recommends pharmacy administration. - Advise to receive Shingrix vaccine at a pharmacy. - Ensure pharmacy sends vaccination report.   Below is where you got last diabetic eye exam. I placed referral today but you can also call them directly.  [facility] - [facility] [address] [place], [place] [address] [phone]  Dorrie Gaudier, MD [address] [place], [place] [address] [phone] (Work) [phone] (Fax)

## 2024-04-18 ENCOUNTER — Encounter: Payer: Self-pay | Admitting: Orthopaedic Surgery

## 2024-04-18 ENCOUNTER — Ambulatory Visit: Payer: PPO | Admitting: Orthopaedic Surgery

## 2024-04-18 ENCOUNTER — Other Ambulatory Visit (INDEPENDENT_AMBULATORY_CARE_PROVIDER_SITE_OTHER)

## 2024-04-18 DIAGNOSIS — Z96651 Presence of right artificial knee joint: Secondary | ICD-10-CM

## 2024-04-18 NOTE — Progress Notes (Signed)
 The patient is a 73 year old gentleman who is now 5 months status post a right press-fit total knee arthroplasty.  We replaced his left knee 2 years ago.  He says he is doing well overall.  On exam his left knee that was replaced 2 years ago has full range of motion with no swelling and is stable.  His right knee has just some slight warmth and swelling but overall looks good with excellent range of motion and good stability on exam.  An AP and lateral of the right knee shows a well-seated right total knee arthroplasty with no complicating features.  Left knee is seen on the AP view looks good.  He will continue to increase his activities as comfort allows.  Will see him back for final visit in 53-month with a final AP and lateral of his right knee.

## 2024-04-24 ENCOUNTER — Ambulatory Visit: Payer: Self-pay | Admitting: Endocrinology

## 2024-04-24 ENCOUNTER — Ambulatory Visit: Admitting: Endocrinology

## 2024-04-24 ENCOUNTER — Encounter: Payer: Self-pay | Admitting: Endocrinology

## 2024-04-24 VITALS — BP 118/70 | HR 74 | Resp 20 | Ht 64.0 in | Wt 194.2 lb

## 2024-04-24 DIAGNOSIS — Z794 Long term (current) use of insulin: Secondary | ICD-10-CM

## 2024-04-24 DIAGNOSIS — E119 Type 2 diabetes mellitus without complications: Secondary | ICD-10-CM | POA: Diagnosis not present

## 2024-04-24 LAB — POCT GLYCOSYLATED HEMOGLOBIN (HGB A1C): Hemoglobin A1C: 7.6 % — AB (ref 4.0–5.6)

## 2024-04-24 NOTE — Progress Notes (Signed)
 Outpatient Endocrinology Note Samuel Iylah Dworkin, MD  04/24/24  Patient Name: Samuel [name]   DOB : 1951/08/01 MRN # [id number]                                                    REASON OF VISIT: Follow up for type 2 diabetes mellitus  PCP: Sylvia Everts, PA-C  HISTORY OF PRESENT ILLNESS:   Samuel [name] is a 73 y.o. old male with past medical history listed below, is here for follow up for type 2 diabetes mellitus.   Pertinent Diabetes History:  _Diagnosed as type 2 in 1990.  Patient was initially treated with metformin  and later insulin  added.  He was mostly on insulin  70/30 regimen in the past.  At some point he was also on basal bolus insulin  with Toujeo  and Humalog .  Lately he has been on insulin  pump therapy.  OmniPod Dash insulin  pump started in 2018/2019.  Chronic Diabetes Complications : Retinopathy: no. Last ophthalmology exam was done on annually, reportedly. Nephropathy: no, on losartan  Peripheral neuropathy: no Coronary artery disease: no Stroke: no  Relevant comorbidities and cardiovascular risk factors: Obesity: yes Body mass index is 33.33 kg/m.  Hypertension: Yes Hyperlipidemia.  Yes, on statin.  Current / Home Diabetic regimen includes: Omnipod 4 / Dash /not with sensor.  He uses Humalog  U100.  Synjardy  03-999 mg 2 tab daily.  Trulicity  4.5 mg weekly.  Insulin  Pump setting:  Basal ( 30. 75 units/day) MN-    0.9 u/hour 2:30AM- 1.0  8AM-    1.5   10 PM- 1.0       Bolus CHO Ratio (1unit:CHO) MN- 1:1 ( using fixed manual bolus 8-16 units)  Correction/Sensitivity: MN- 1:25  Target: 130   Active insulin  time: 4 hours  Prior diabetic medications: Insulin  70/30, Toujeo   INSULIN  PUMP DATA:                         Average total daily insulin : 59 units, Basal: 48%, Bolus: 52 %  He does not use sensor with OmniPod.  He does not do carb counting.  He uses fixed dose bolus from 6 to 16 units depending upon the blood sugar and food size with  meals, 1-4 times a day.   Glycemic data:    CONTINUOUS GLUCOSE MONITORING SYSTEM (CGMS) INTERPRETATION:  FreeStyle Libre 2 CGM-  Sensor Download (Sensor download was reviewed and summarized below.) Dates: June 4 to June 17 , 2025, 14 days. Sensor Average: 148 Glucose Management Indicator: 6.9%  % data captured: 99%     Interpretation: Mostly acceptable blood sugar.  Random hypoglycemia with blood sugar in 60s in between the meals, in the evening and rarely overnight around 2 AM.  No concerning hyperglycemia.  No significant hypoglycemia.  Hypoglycemia: Patient has no concerning hypoglycemic episodes. Patient has hypoglycemia awareness.    Factors modifying glucose control: 1.  Diabetic diet assessment: Almost always eats a bedtime snack.  Supper around 6 PM.  2.  Staying active or exercising: Occasionally walk.  3.  Medication compliance: compliant all of the time.  Interval history Pump and CGM data reviewed.  Random mild occasional hypoglycemia.  Overall acceptable blood sugar.  GMI 6.9%.  Hemoglobin A1c 7.6%.  He has been bolusing for meals 0-4 times a day.  He  reports she has less physical activities or walking after he had knee surgery few months ago.  He has been taking Synjardy  and Trulicity , denies any GI issues.  No other complaints today.  Video Spanish language interpretation used during the clinic visit.  REVIEW OF SYSTEMS As per history of present illness.   PAST MEDICAL HISTORY: Past Medical History:  Diagnosis Date   Arthritis    Blood transfusion without reported diagnosis    Diabetes mellitus without complication (HCC)    type II   History of ETOH abuse    Hypertension     PAST SURGICAL HISTORY: Past Surgical History:  Procedure Laterality Date   BACK SURGERY N/A 2010   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   Procedure: LEFT TOTAL KNEE ARTHROPLASTY;  Surgeon: Arnie Lao, MD;  Location: [facility];  Service: Orthopedics;  Laterality:  Left;   TOTAL KNEE ARTHROPLASTY Right 11/15/2023   Procedure: RIGHT TOTAL KNEE ARTHROPLASTY;  Surgeon: Arnie Lao, MD;  Location: [facility];  Service: Orthopedics;  Laterality: Right;    ALLERGIES: No Known Allergies  FAMILY HISTORY:  Family History  Problem Relation Age of Onset   Diabetes Unknown    Hypertension Unknown    Arthritis Father    Diabetes Father     SOCIAL HISTORY: Social History   Socioeconomic History   Marital status: Married    Spouse name: Not on file   Number of children: Not on file   Years of education: Not on file   Highest education level: 3rd grade  Occupational History   Not on file  Tobacco Use   Smoking status: Former   Smokeless tobacco: Former    Quit date: 11/08/1993  Vaping Use   Vaping status: Former  Substance and Sexual Activity   Alcohol use: No   Drug use: No   Sexual activity: Not on file  Other Topics Concern   Not on file  Social History Narrative   Not on file   Social Drivers of Health   Financial Resource Strain: Low Risk  (06/21/2023)   Overall Financial Resource Strain (CARDIA)    Difficulty of Paying Living Expenses: Not hard at all  Food Insecurity: No Food Insecurity (06/21/2023)   Hunger Vital Sign    Worried About Running Out of Food in the Last Year: Never true    Ran Out of Food in the Last Year: Never true  Transportation Needs: No Transportation Needs (06/21/2023)   PRAPARE - Administrator, Civil Service (Medical): No    Lack of Transportation (Non-Medical): No  Physical Activity: Insufficiently Active (06/21/2023)   Exercise Vital Sign    Days of Exercise per Week: 2 days    Minutes of Exercise per Session: 20 min  Stress: No Stress Concern Present (06/21/2023)   [employer] of Occupational Health - Occupational Stress Questionnaire    Feeling of Stress : Not at all  Social Connections: Socially Integrated (06/21/2023)   Social Connection and Isolation Panel    Frequency of  Communication with Friends and Family: More than three times a week    Frequency of Social Gatherings with Friends and Family: Twice a week    Attends Religious Services: 1 to 4 times per year    Active Member of [employer] or Organizations: Yes    Attends Banker Meetings: 1 to 4 times per year    Marital Status: Married    MEDICATIONS:  Current Outpatient Medications  Medication Sig Dispense Refill  aspirin  81 MG chewable tablet Chew 1 tablet (81 mg total) by mouth 2 (two) times daily. 35 tablet 0   atorvastatin  (LIPITOR) 40 MG tablet TAKE 1 TABLET BY MOUTH EVERY MORNING AND 1/2 AT BEDTIME 135 tablet 1   B-D UF III MINI PEN NEEDLES 31G X 5 MM MISC USE FIVE PER DAY AS DIRECTED 200 each 0   Blood Glucose Monitoring Suppl (ONETOUCH VERIO REFLECT) w/Device KIT PLEASE USE WITH GLUCOMETER MACHINE TO CHECK BLOOD GLUCOSE LEVELS AS NEEDED, FOR BACKUP OF CGM 1 kit 0   Continuous Glucose Receiver (FREESTYLE LIBRE 2 READER) DEVI Use with sensor to track blood glucose 1 each 0   Continuous Glucose Sensor (FREESTYLE LIBRE 2 SENSOR) MISC 2 Devices by Does not apply route every 14 (fourteen) days. 6 each 3   Dulaglutide  (TRULICITY ) 4.5 MG/0.5ML SOAJ Inject 4.5 mg as directed once a week. 6 mL 3   Empagliflozin -metFORMIN  HCl ER (SYNJARDY  XR) 03-999 MG TB24 Take 2 tablets by mouth daily. 180 tablet 3   fluticasone  (FLONASE ) 50 MCG/ACT nasal spray Place 2 sprays into both nostrils daily. 16 g 1   gabapentin  (NEURONTIN ) 300 MG capsule Take 1 capsule (300 mg total) by mouth at bedtime. 30 capsule 0   Glucagon  (GVOKE HYPOPEN  1-PACK) 1 MG/0.2ML SOAJ Inject 1 mg into the skin as needed (low blood sugar with impaired consciousness). 0.4 mL 2   glucose blood (FREESTYLE LITE) test strip USE AS INSTRUCTED FOUR TIMES DAILY 150 strip 2   glucose blood test strip Use as instructed 100 each 12   Insulin  Disposable Pump (OMNIPOD DASH INTRO, GEN 4,) KIT Change every 3 days 1 kit 0   Insulin  Disposable Pump  (OMNIPOD DASH PODS, GEN 4,) MISC USE FOR INSULIN  PUMP EVEY 3 DAYS 30 each 5   insulin  lispro (HUMALOG ) 100 UNIT/ML injection Max daily 125 units per pump 100 mL 3   Insulin  Pen Needle 32G X 4 MM MISC Use 5 pens per day to inject insulin  200 each 3   Insulin  Syringe-Needle U-100 (INSULIN  SYRINGE .5CC/30GX5/16) 30G X 5/16 0.5 ML MISC Use 3 times a day for insulin  300 each 1   levocetirizine (XYZAL ) 5 MG tablet Take 1 tablet (5 mg total) by mouth every evening. 30 tablet 3   levocetirizine (XYZAL ) 5 MG tablet Take 1 tablet (5 mg total) by mouth every evening. 90 tablet 3   losartan  (COZAAR ) 50 MG tablet TAKE 1 TABLET(50 MG) BY MOUTH DAILY 90 tablet 1   methocarbamol  (ROBAXIN ) 500 MG tablet Take 1 tablet (500 mg total) by mouth every 6 (six) hours as needed for muscle spasms. 40 tablet 1   OVER THE COUNTER MEDICATION Take 2 tablets by mouth daily. 4LIFE TRANSFER FACTORY RECALL     oxyCODONE  (OXY IR/ROXICODONE ) 5 MG immediate release tablet Take 1 tablet (5 mg total) by mouth 3 (three) times daily as needed for moderate pain (pain score 4-6) (pain score 4-6). 30 tablet 0   No current facility-administered medications for this visit.    PHYSICAL EXAM: Vitals:   04/24/24 1313  BP: 118/70  Pulse: 74  Resp: 20  SpO2: 97%  Weight: 194 lb 3.2 oz (88.1 kg)  Height: 5' 4 (1.626 m)     Body mass index is 33.33 kg/m.  Wt Readings from Last 3 Encounters:  04/24/24 194 lb 3.2 oz (88.1 kg)  04/16/24 194 lb (88 kg)  01/10/24 191 lb (86.6 kg)    General: Well developed, well nourished male in no  apparent distress.  HEENT: AT/[place], no external lesions.  Eyes: Conjunctiva clear and no icterus. Neck: Neck supple  Lungs: Respirations not labored Neurologic: Alert, oriented, normal speech Extremities / Skin: Dry.   Psychiatric: Does not appear depressed or anxious  Diabetic Foot Exam - Simple   No data filed    LABS Reviewed Lab Results  Component Value Date   HGBA1C 7.6 (A) 04/24/2024    HGBA1C 7.1 (A) 01/10/2024   HGBA1C 6.9 (A) 10/10/2023   Lab Results  Component Value Date   FRUCTOSAMINE 302 (H) 07/20/2017   Lab Results  Component Value Date   CHOL 84 08/15/2023   HDL 48.70 08/15/2023   LDLCALC 14 08/15/2023   LDLDIRECT 44.0 01/24/2020   TRIG 107.0 08/15/2023   CHOLHDL 2 08/15/2023   Lab Results  Component Value Date   MICRALBCREAT 1.5 06/15/2023   MICRALBCREAT 0.7 05/24/2022   Lab Results  Component Value Date   CREATININE 0.95 11/16/2023   Lab Results  Component Value Date   GFR 86.83 06/22/2023    ASSESSMENT / PLAN  1. Type 2 diabetes mellitus without complication, with long-term current use of insulin  (HCC)    Diagnoses and all orders for this visit:  Type 2 diabetes mellitus without complication, with long-term current use of insulin  (HCC) -     POCT glycosylated hemoglobin (Hb A1C)   Diabetes Mellitus type 2  - Diabetic status / severity: uncontrolled  Lab Results  Component Value Date   HGBA1C 7.6 (A) 04/24/2024    - Hemoglobin A1c goal < 7-7.5% range.   GMI on CGM 6.9%.  He has random hypoglycemia in between the meals and overnight.  Decrease basal rate as follows.  - Medications:   Basal ( 30. 75 units/day) MN-    0.9 u/hour 2:30AM    - 1.0 , changed to 0.9 8AM-    1.5  , changed to 1.4 10 PM- 1.0       Bolus CHO Ratio (1unit:CHO) MN- 1:1 ( using fixed manual bolus 6-16 units)  Correction/Sensitivity: MN- 1:25  Target: 130   Active insulin  time: 4 hours Discussed about adjusting fixed bolus of insulin  for his meals 6 to 16 units based on meal size and blood sugar normal at that time. He does not carb count.  Advised to take bolus insulin  less by 2 units when he has increased physical activity for example walking.  -Continue Synjardy  03-999 mg 2 tab daily.   -Continue Trulicity  4.5 mg weekly.  - Home glucose testing: continue CGM and check blood glucose as needed.   - Discussed/ Gave Hypoglycemia treatment  plan.  # Consult : not required at this time.   # Annual urine for microalbuminuria/ creatinine ratio, no microalbuminuria currently, continue losartan .  Last  Lab Results  Component Value Date   MICRALBCREAT 1.5 06/15/2023    # Foot check nightly.  # Annual dilated diabetic eye exams.   - Diet: Make healthy diabetic food choices, advised to avoid high carbohydrate meal and sweets and sugary meals. - Life style / activity / exercise: Discussed.  Advised for walking and increase physical activity.  Blood pressure  -  BP Readings from Last 1 Encounters:  04/24/24 118/70    - Control is in target.  - No change in current plans.  Lipid status / Hyperlipidemia - Last  Lab Results  Component Value Date   LDLCALC 14 08/15/2023   - Continue atorvastatin  40 mg daily.   DISPOSITION Follow up in  clinic in 3 months suggested.  Labs on the same day of the visit.   All questions answered and patient verbalized understanding of the plan.  Samuel My Rinke, MD [facility] Endocrinology [facility] [address] [place], [address] [place], [place] [address] Phone # [phone]  At least part of this note was generated using voice recognition software. Inadvertent word errors may have occurred, which were not recognized during the proofreading process.

## 2024-04-25 ENCOUNTER — Encounter: Payer: Self-pay | Admitting: Endocrinology

## 2024-05-03 ENCOUNTER — Encounter: Payer: Self-pay | Admitting: Endocrinology

## 2024-05-04 ENCOUNTER — Other Ambulatory Visit: Payer: Self-pay

## 2024-05-04 ENCOUNTER — Encounter: Payer: Self-pay | Admitting: Neurology

## 2024-05-04 DIAGNOSIS — E1165 Type 2 diabetes mellitus with hyperglycemia: Secondary | ICD-10-CM

## 2024-05-04 MED ORDER — INSULIN SYRINGE 30G X 5/16" 0.5 ML MISC: 1 refills | Status: AC

## 2024-05-04 NOTE — Telephone Encounter (Signed)
 He should be changing pod every 3 days in that case usually gets 30 PODS for 90 days.  Based on his daily insulin  requirement 1 POD should be enough for 3 days.  He does not need to change the pod sooner than 3 days.   If he does not have PODs for 2 weeks. Please provide him the OmniPod representative phone number if they can provide him some extra PODS.  Samuel Arnett Duddy, MD [facility] Endocrinology [facility] [address] [place], [address] [place], [place] [address] Phone # [phone]

## 2024-05-07 ENCOUNTER — Other Ambulatory Visit: Payer: Self-pay

## 2024-05-08 NOTE — Progress Notes (Signed)
 Assessment/Plan:   1.  Essential Tremor.  -This is evidenced by the symmetrical nature and longstanding hx of gradually getting worse.  We discussed nature and pathophysiology.  We discussed that this can continue to gradually get worse with time.  We discussed that some medications can worsen this, as can caffeine use.  We discussed medication therapy as well as surgical therapy.  Ultimately, the patient decided to trial primidone  and work to 50 mg twice per day.   Risk, benefits, side effects were discussed and understanding expressed.  2.  Peripheral neuropathy  - Patient does have evidence of this on examination, likely due to very longstanding history of diabetes mellitus.  He states it is under better control now as he is wearing a glucose monitor.  Subjective:   CINCH ORMOND was seen today in the movement disorders clinic for neurologic consultation at the request of Saguier, [place], [place].  The consultation is for the evaluation of bilateral upper extremity rest tremor (pt denies today it is rest tremor, however).  Medical translator is present.  Referral indicates that tremor has been going on for about 3 years.  When I review records, there are notes about tremor dating back to November, 2019.  At that point in time, the patient was complaining about occasional severe shaking of his right hand.  At that point in time, it was not present on his examination and they decided to take a wait-and-see approach.  Tremor was again mentioned by the patient, this time in both hands, in February, 2023, right greater than left.  They discussed beta-blocker therapy, but decided to hold on that and continue to take a wait-and-see approach.  When he last mentioned in June, 2025, referral was made for neurologic opinion.  He presents today for that opinion.  Tremor: Yes.     How long has it been going on? At least since 2019  At rest or with activation?  Activation; never at rest  When is it noted the  most?  Drinking coffee; signing his name  Fam hx of tremor?  No.  Located where?  Bilateral UE; he is R handed  Affected by caffeine:  No. (drinks lots of coffee - caffeine in the AM/decaf in the evening)  Affected by alcohol:  No. (occasional beer but not often)  Affected by stress:  Yes.    Affected by fatigue:  No.  Spills soup if on spoon:  Yes.    Spills glass of liquid if full:  Yes.  , intermittent  Affects ADL's (tying shoes, brushing teeth, etc):  occasional trouble with shaving  Tremor inducing meds:  No.  Other Specific Symptoms:  Voice: no change Postural symptoms:  does better now that he has had b/l TKA (last done 11/2023)  Falls?  No. Bradykinesia symptoms: no bradykinesia noted Loss of smell:  No. Loss of taste:  No. Difficulty Swallowing:  No. Handwriting, micrographia: No. N/V:  No. Lightheaded:  few times  Syncope: No. Diplopia:  No.  Patient had a CT brain outside of our system in 2021 that was noted to show mild small vessel disease and otherwise was unremarkable.  I do not have films.   ALLERGIES:  No Known Allergies  CURRENT MEDICATIONS:  Current Outpatient Medications  Medication Instructions   aspirin  81 mg, Oral, 2 times daily   atorvastatin  (LIPITOR) 40 MG tablet TAKE 1 TABLET BY MOUTH EVERY MORNING AND 1/2 AT BEDTIME   B-D UF III MINI PEN NEEDLES 31G X  5 MM MISC USE FIVE PER DAY AS DIRECTED   Blood Glucose Monitoring Suppl (ONETOUCH VERIO REFLECT) w/Device KIT PLEASE USE WITH GLUCOMETER MACHINE TO CHECK BLOOD GLUCOSE LEVELS AS NEEDED, FOR BACKUP OF CGM   Continuous Glucose Receiver (FREESTYLE LIBRE 2 READER) DEVI Use with sensor to track blood glucose   Continuous Glucose Sensor (FREESTYLE LIBRE 2 SENSOR) MISC 2 Devices, Does not apply, Every 14 days   Empagliflozin -metFORMIN  HCl ER (SYNJARDY  XR) 03-999 MG TB24 2 tablets, Oral, Daily   fluticasone  (FLONASE ) 50 MCG/ACT nasal spray 2 sprays, Each Nare, Daily   glucose blood (FREESTYLE LITE) test  strip USE AS INSTRUCTED FOUR TIMES DAILY   glucose blood test strip Use as instructed   Gvoke HypoPen  1-Pack 1 mg, Subcutaneous, As needed   Insulin  Disposable Pump (OMNIPOD DASH INTRO, GEN 4,) KIT Change every 3 days   Insulin  Disposable Pump (OMNIPOD DASH PODS, GEN 4,) MISC USE FOR INSULIN  PUMP EVEY 3 DAYS   insulin  lispro (HUMALOG ) 100 UNIT/ML injection Max daily 125 units per pump   Insulin  Pen Needle 32G X 4 MM MISC Use 5 pens per day to inject insulin    Insulin  Syringe-Needle U-100 (INSULIN  SYRINGE .5CC/30GX5/16) 30G X 5/16 0.5 ML MISC Use 3 times a day for insulin    levocetirizine (XYZAL ) 5 mg, Oral, Every evening   levocetirizine (XYZAL ) 5 mg, Oral, Every evening   losartan  (COZAAR ) 50 MG tablet TAKE 1 TABLET(50 MG) BY MOUTH DAILY   methocarbamol  (ROBAXIN ) 500 mg, Oral, Every 6 hours PRN   OVER THE COUNTER MEDICATION 2 tablets, Daily   Trulicity  4.5 mg, Injection, Weekly    Objective:   PHYSICAL EXAMINATION:    VITALS:   Vitals:   05/15/24 0940  BP: (!) 142/82  Pulse: 75  SpO2: 96%  Weight: 196 lb 6.4 oz (89.1 kg)  Height: 5' 5 (1.651 m)    GEN:  The patient appears stated age and is in NAD. HEENT:  Normocephalic, atraumatic.  The mucous membranes are moist. The superficial temporal arteries are without ropiness or tenderness. CV:  RRR Lungs:  CTAB Neck/HEME:  There are no carotid bruits bilaterally.  Neurological examination:  Orientation: The patient is alert and oriented x3.  Cranial nerves: There is good facial symmetry.  Extraocular muscles are intact. The visual fields are full to confrontational testing. The speech is fluent and clear per translator, although he does not speak this examiner's language (very limited). Soft palate rises symmetrically and there is no tongue deviation. Hearing is intact to conversational tone. Sensation: Sensation is intact to light touch throughout (facial, trunk, extremities). Vibration is intact at the bilateral ankle. There  is no extinction with double simultaneous stimulation.  Motor: Strength is 5/5 in the bilateral upper and lower extremities.   Shoulder shrug is equal and symmetric.  There is no pronator drift. Deep tendon reflexes: Deep tendon reflexes are 0-1/4 at the bilateral biceps, triceps, brachioradialis, patella and achilles. Plantar responses are downgoing bilaterally.  Movement examination: Tone: There is normal tone in the bilateral upper extremities.  The tone in the lower extremities is normal.  Abnormal movements: There is mild left upper extremity rest tremor, primarily in the thumb, but it is rare and does not increase with distraction procedures.  He does have postural tremor bilaterally, overall mild.  He has slightly worse intention tremor bilaterally.  He does not spill water  when pouring it from 1 glass to another, but he is very careful so that he does not spill it.  Tremor is evident.  He has trouble with Archimedes spirals, right greater than left.  Coordination:  There is no decremation with RAM's, with any form of RAMS, including alternating supination and pronation of the forearm, hand opening and closing, finger taps, heel taps and toe taps.  Gait and Station: The patient has no difficulty arising out of a deep-seated chair without the use of the hands. The patient's stride length is good.    I have reviewed and interpreted the following labs independently   Chemistry      Component Value Date/Time   NA 133 (L) 11/16/2023 0835   K 4.2 11/16/2023 0835   CL 97 (L) 11/16/2023 0835   CO2 26 11/16/2023 0835   BUN 16 11/16/2023 0835   CREATININE 0.95 11/16/2023 0835   CREATININE 0.87 11/26/2022 1623      Component Value Date/Time   CALCIUM  8.7 (L) 11/16/2023 0835   ALKPHOS 52 07/28/2023 1530   AST 27 07/28/2023 1530   ALT 30 07/28/2023 1530   BILITOT 0.9 07/28/2023 1530      Lab Results  Component Value Date   TSH 1.94 11/26/2022   Lab Results  Component Value Date   WBC  8.7 11/16/2023   HGB 13.5 11/16/2023   HCT 39.3 11/16/2023   MCV 88.7 11/16/2023   PLT 173 11/16/2023   Lab Results  Component Value Date   HGBA1C 7.6 (A) 04/24/2024      Total time spent on today's visit was 45 minutes, including both face-to-face time and nonface-to-face time.  Time included that spent on review of records (prior notes available to me/labs/imaging if pertinent), discussing treatment and goals, answering patient's questions and coordinating care.  Cc:  Saguier, Edward, PA-C

## 2024-05-15 ENCOUNTER — Encounter: Payer: Self-pay | Admitting: Neurology

## 2024-05-15 ENCOUNTER — Ambulatory Visit: Admitting: Neurology

## 2024-05-15 VITALS — BP 142/82 | HR 75 | Ht 65.0 in | Wt 196.4 lb

## 2024-05-15 DIAGNOSIS — G250 Essential tremor: Secondary | ICD-10-CM | POA: Diagnosis not present

## 2024-05-15 DIAGNOSIS — E1142 Type 2 diabetes mellitus with diabetic polyneuropathy: Secondary | ICD-10-CM

## 2024-05-15 MED ORDER — PRIMIDONE 50 MG PO TABS: 50.0000 mg | ORAL_TABLET | Freq: Two times a day (BID) | ORAL | 1 refills | Status: DC

## 2024-05-15 NOTE — Patient Instructions (Addendum)
 Start primidone  50 mg - 1/2 tablet at bedtime for 1 week and then increase to 1 tablet at bedtime x 1 week and then 1 tablet twice per day thereafter.  Essential Tremor A tremor is trembling or shaking that a person cannot control. Most tremors affect the hands or arms. Tremors can also affect the head, vocal cords, legs, and other parts of the body. Essential tremor is a tremor without a known cause. Usually, it occurs while a person is trying to perform an action. It tends to get worse gradually as a person ages. What are the causes? The cause of this condition is not known, but it often runs in families. What increases the risk? You are more likely to develop this condition if: You have a family member with essential tremor. You are 73 years of age or older. What are the signs or symptoms? The main sign of a tremor is a rhythmic shaking of certain parts of your body that is uncontrolled and unintentional. You may: Have difficulty eating with a spoon or fork. Have difficulty writing. Nod your head up and down or side to side. Have a quivering voice. The shaking may: Get worse over time. Come and go. Be more noticeable on one side of your body. Get worse due to stress, tiredness (fatigue), caffeine, and extreme heat or cold. How is this diagnosed? This condition may be diagnosed based on: Your symptoms and medical history. A physical exam. There is no single test to diagnose an essential tremor. However, your health care provider may order tests to rule out other causes of your condition. These may include: Blood and urine tests. Imaging studies of your brain, such as a CT scan or MRI. How is this treated? Treatment for essential tremor depends on the severity of the condition. Mild tremors may not need treatment if they do not affect your day-to-day life. Severe tremors may need to be treated using one or more of the following options: Medicines. Injections of a substance called  botulinum toxin. Procedures such as deep brain stimulation (DBS) implantation or MRI-guided ultrasound treatment. Lifestyle changes. Occupational or physical therapy. Follow these instructions at home: Lifestyle  Do not use any products that contain nicotine or tobacco. These products include cigarettes, chewing tobacco, and vaping devices, such as e-cigarettes. If you need help quitting, ask your health care provider. Limit your caffeine intake as told by your health care provider. Try to get 8 hours of sleep each night. Find ways to manage your stress that fit your lifestyle and personality. Consider trying meditation or yoga. Try to anticipate stressful situations and allow extra time to manage them. If you are struggling emotionally with the effects of your tremor, consider working with a mental health provider. General instructions Take over-the-counter and prescription medicines only as told by your health care provider. Avoid extreme heat and extreme cold. Keep all follow-up visits. This is important. Visits may include physical therapy visits. Where to find more information [employer] of Neurological Disorders and Stroke: [email or web address] Contact a health care provider if: You experience any changes in the location or intensity of your tremors. You start having a tremor after starting a new medicine. You have a tremor with other symptoms, such as: Numbness. Tingling. Pain. Weakness. Your tremor gets worse. Your tremor interferes with your daily life. You feel down, blue, or sad for at least 2 weeks in a row. Worrying about your tremor and what other people think about you interferes with your everyday  life functions, including relationships, work, or school. Summary Essential tremor is a tremor without a known cause. Usually, it occurs when you are trying to perform an action. You are more likely to develop this condition if you have a family member with essential  tremor. The main sign of a tremor is a rhythmic shaking of certain parts of your body that is uncontrolled and unintentional. Treatment for essential tremor depends on the severity of the condition. This information is not intended to replace advice given to you by your health care provider. Make sure you discuss any questions you have with your health care provider. Document Revised: 08/14/2021 Document Reviewed: 08/14/2021 Elsevier Patient Education  2024 [employer].

## 2024-05-23 ENCOUNTER — Ambulatory Visit

## 2024-05-23 VITALS — BP 142/82 | Ht 65.0 in | Wt 196.0 lb

## 2024-05-23 DIAGNOSIS — Z2821 Immunization not carried out because of patient refusal: Secondary | ICD-10-CM

## 2024-05-23 NOTE — Progress Notes (Signed)
 Because this visit was a virtual/telehealth visit,  certain criteria was not obtained, such a blood pressure, CBG if applicable, and timed get up and go. Any medications not marked as taking were not mentioned during the medication reconciliation part of the visit. Any vitals not documented were not able to be obtained due to this being a telehealth visit or patient was unable to self-report a recent blood pressure reading due to a lack of equipment at home via telehealth. Vitals that have been documented are verbally provided by the patient.   This visit was performed by a medical professional under my direct supervision. I was immediately available for consultation/collaboration. I have reviewed and agree with the Annual Wellness Visit documentation.  Subjective:   Samuel [name] is a 73 y.o. who presents for a Medicare Wellness preventive visit.  As a reminder, Annual Wellness Visits don't include a physical exam, and some assessments may be limited, especially if this visit is performed virtually. We may recommend an in-person follow-up visit with your provider if needed.  Visit Complete: Virtual I connected with  Samuel [name] on 05/23/24 by a audio enabled telemedicine application and verified that I am speaking with the correct person using two identifiers.  Patient Location: Home  Provider Location: Home Office  I discussed the limitations of evaluation and management by telemedicine. The patient expressed understanding and agreed to proceed.  Vital Signs: Because this visit was a virtual/telehealth visit, some criteria may be missing or patient reported. Any vitals not documented were not able to be obtained and vitals that have been documented are patient reported.  VideoDeclined- This patient declined Librarian, academic. Therefore the visit was completed with audio only.  Persons Participating in Visit: Patient.  AWV Questionnaire: No: Patient  Medicare AWV questionnaire was not completed prior to this visit.  Cardiac Risk Factors include: advanced age (>23men, >38 women);male gender;hypertension;obesity (BMI >30kg/m2);diabetes mellitus     Objective:    Today's Vitals   05/23/24 1558  BP: (!) 142/82  Weight: 196 lb (88.9 kg)  Height: 5' 5 (1.651 m)   Body mass index is 32.62 kg/m.     05/23/2024    4:02 PM 05/15/2024    9:41 AM 11/03/2023    9:01 AM 07/28/2023    2:25 PM 01/11/2022   10:23 AM 09/18/2021    3:26 PM 08/07/2021    2:49 PM  Advanced Directives  Does Patient Have a Medical Advance Directive? No No No No No Yes No  Would patient like information on creating a medical advance directive?   No - Patient declined No - Patient declined No - Patient declined  No - Patient declined    Current Medications (verified) Outpatient Encounter Medications as of 05/23/2024  Medication Sig   aspirin  81 MG chewable tablet Chew 1 tablet (81 mg total) by mouth 2 (two) times daily.   atorvastatin  (LIPITOR) 40 MG tablet TAKE 1 TABLET BY MOUTH EVERY MORNING AND 1/2 AT BEDTIME   B-D UF III MINI PEN NEEDLES 31G X 5 MM MISC USE FIVE PER DAY AS DIRECTED   Blood Glucose Monitoring Suppl (ONETOUCH VERIO REFLECT) w/Device KIT PLEASE USE WITH GLUCOMETER MACHINE TO CHECK BLOOD GLUCOSE LEVELS AS NEEDED, FOR BACKUP OF CGM   Continuous Glucose Receiver (FREESTYLE LIBRE 2 READER) DEVI Use with sensor to track blood glucose   Continuous Glucose Sensor (FREESTYLE LIBRE 2 SENSOR) MISC 2 Devices by Does not apply route every 14 (fourteen) days.   Dulaglutide  (  TRULICITY ) 4.5 MG/0.5ML SOAJ Inject 4.5 mg as directed once a week.   Empagliflozin -metFORMIN  HCl ER (SYNJARDY  XR) 03-999 MG TB24 Take 2 tablets by mouth daily.   fluticasone  (FLONASE ) 50 MCG/ACT nasal spray Place 2 sprays into both nostrils daily.   Glucagon  (GVOKE HYPOPEN  1-PACK) 1 MG/0.2ML SOAJ Inject 1 mg into the skin as needed (low blood sugar with impaired consciousness).   glucose  blood (FREESTYLE LITE) test strip USE AS INSTRUCTED FOUR TIMES DAILY   glucose blood test strip Use as instructed   Insulin  Disposable Pump (OMNIPOD DASH INTRO, GEN 4,) KIT Change every 3 days   Insulin  Disposable Pump (OMNIPOD DASH PODS, GEN 4,) MISC USE FOR INSULIN  PUMP EVEY 3 DAYS   insulin  lispro (HUMALOG ) 100 UNIT/ML injection Max daily 125 units per pump   Insulin  Pen Needle 32G X 4 MM MISC Use 5 pens per day to inject insulin    Insulin  Syringe-Needle U-100 (INSULIN  SYRINGE .5CC/30GX5/16) 30G X 5/16 0.5 ML MISC Use 3 times a day for insulin    levocetirizine (XYZAL ) 5 MG tablet Take 1 tablet (5 mg total) by mouth every evening.   levocetirizine (XYZAL ) 5 MG tablet Take 1 tablet (5 mg total) by mouth every evening.   losartan  (COZAAR ) 50 MG tablet TAKE 1 TABLET(50 MG) BY MOUTH DAILY   methocarbamol  (ROBAXIN ) 500 MG tablet Take 1 tablet (500 mg total) by mouth every 6 (six) hours as needed for muscle spasms.   OVER THE COUNTER MEDICATION Take 2 tablets by mouth daily. 4LIFE TRANSFER FACTORY RECALL   primidone  (MYSOLINE ) 50 MG tablet Take 1 tablet (50 mg total) by mouth 2 (two) times daily.   No facility-administered encounter medications on file as of 05/23/2024.    Allergies (verified) Patient has no known allergies.   History: Past Medical History:  Diagnosis Date   Arthritis    Blood transfusion without reported diagnosis    Diabetes mellitus without complication (HCC)    type II   History of ETOH abuse    Hypertension    Past Surgical History:  Procedure Laterality Date   BACK SURGERY N/A 11/08/2008   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   Procedure: LEFT TOTAL KNEE ARTHROPLASTY;  Surgeon: Vernetta Lonni GRADE, MD;  Location: [facility];  Service: Orthopedics;  Laterality: Left;   TOTAL KNEE ARTHROPLASTY Right 11/15/2023   Procedure: RIGHT TOTAL KNEE ARTHROPLASTY;  Surgeon: Vernetta Lonni GRADE, MD;  Location: [facility];  Service: Orthopedics;  Laterality: Right;    Family History  Problem Relation Age of Onset   Arthritis Father    Diabetes Father    Diabetes Sister    Diabetes Other    Hypertension Other    Diabetes Child    Social History   Socioeconomic History   Marital status: Married    Spouse name: Not on file   Number of children: Not on file   Years of education: Not on file   Highest education level: 3rd grade  Occupational History   Occupation: retired    Comment: maintenanc  Tobacco Use   Smoking status: Former   Smokeless tobacco: Former    Quit date: 11/08/1993  Vaping Use   Vaping status: Former  Substance and Sexual Activity   Alcohol use: Yes    Comment: occasional   Drug use: No   Sexual activity: Not on file  Other Topics Concern   Not on file  Social History Narrative   Not on file   Social Drivers of Health  Financial Resource Strain: Low Risk  (05/23/2024)   Overall Financial Resource Strain (CARDIA)    Difficulty of Paying Living Expenses: Not hard at all  Food Insecurity: No Food Insecurity (05/23/2024)   Hunger Vital Sign    Worried About Running Out of Food in the Last Year: Never true    Ran Out of Food in the Last Year: Never true  Transportation Needs: No Transportation Needs (05/23/2024)   PRAPARE - Administrator, Civil Service (Medical): No    Lack of Transportation (Non-Medical): No  Physical Activity: Insufficiently Active (05/23/2024)   Exercise Vital Sign    Days of Exercise per Week: 2 days    Minutes of Exercise per Session: 20 min  Stress: No Stress Concern Present (05/23/2024)   [employer] of Occupational Health - Occupational Stress Questionnaire    Feeling of Stress: Not at all  Social Connections: Socially Integrated (05/23/2024)   Social Connection and Isolation Panel    Frequency of Communication with Friends and Family: More than three times a week    Frequency of Social Gatherings with Friends and Family: Twice a week    Attends Religious Services: 1 to 4  times per year    Active Member of [employer] or Organizations: Yes    Attends Banker Meetings: 1 to 4 times per year    Marital Status: Married    Tobacco Counseling Counseling given: Not Answered    Clinical Intake:  Pre-visit preparation completed: Yes  Pain : No/denies pain     BMI - recorded: 32.62 Nutritional Status: BMI > 30  Obese Nutritional Risks: None Diabetes: No  Lab Results  Component Value Date   HGBA1C 7.6 (A) 04/24/2024   HGBA1C 7.1 (A) 01/10/2024   HGBA1C 6.9 (A) 10/10/2023     How often do you need to have someone help you when you read instructions, pamphlets, or other written materials from your doctor or pharmacy?: 1 - Never  Interpreter Needed?: No  Information entered by :: Jacqueli Pangallo,cma   Activities of Daily Living     05/23/2024    4:01 PM 11/03/2023    9:08 AM  In your present state of health, do you have any difficulty performing the following activities:  Hearing? 0   Vision? 0   Difficulty concentrating or making decisions? 0   Walking or climbing stairs? 0   Dressing or bathing? 0   Doing errands, shopping? 0 0  Preparing Food and eating ? N   Using the Toilet? N   In the past six months, have you accidently leaked urine? N   Do you have problems with loss of bowel control? N   Managing your Medications? N   Managing your Finances? N   Housekeeping or managing your Housekeeping? N     Patient Care Team: Saguier, Edward, PA-C as PCP - General (Physician Assistant)  I have updated your Care Teams any recent Medical Services you may have received from other providers in the past year.     Assessment:   This is a routine wellness examination for Samuel [name].  Hearing/Vision screen Hearing Screening - Comments:: No hearinf aids Vision Screening - Comments:: No difficulties   Goals Addressed             This Visit's Progress    Patient Stated       Patient would like to walk more        Depression  Screen     05/23/2024  4:03 PM 11/26/2022    3:29 PM 09/18/2021    3:26 PM 08/17/2017    8:04 AM 03/20/2015    2:47 PM 10/02/2014   10:31 AM  PHQ 2/9 Scores  PHQ - 2 Score 0 0 0 0 0 0  PHQ- 9 Score 0         Fall Risk     05/23/2024    4:02 PM 05/15/2024    9:40 AM 11/26/2022    3:28 PM 09/18/2021    3:26 PM 08/17/2017    8:04 AM  Fall Risk   Falls in the past year? 0 0 0 0 No   Number falls in past yr: 0 0 0    Injury with Fall? 0 0 0    Risk for fall due to : No Fall Risks  No Fall Risks    Follow up Falls evaluation completed Falls evaluation completed Falls evaluation completed        Data saved with a previous flowsheet row definition    MEDICARE RISK AT HOME:  Medicare Risk at Home Any stairs in or around the home?: Yes If so, are there any without handrails?: No Home free of loose throw rugs in walkways, pet beds, electrical cords, etc?: Yes Adequate lighting in your home to reduce risk of falls?: Yes Life alert?: No Use of a cane, walker or w/c?: No Grab bars in the bathroom?: Yes Shower chair or bench in shower?: Yes Elevated toilet seat or a handicapped toilet?: Yes  TIMED UP AND GO:  Was the test performed?  No  Cognitive Function: 6CIT completed        05/23/2024    4:00 PM  6CIT Screen  What Year? 0 points  What month? 0 points  What time? 0 points  Count back from 20 0 points  Months in reverse 0 points  Repeat phrase 0 points  Total Score 0 points    Immunizations Immunization History  Administered Date(s) Administered   Fluad Quad(high Dose 65+) 07/19/2019, 10/30/2021   Fluad Trivalent(High Dose 65+) 08/16/2023   Influenza, High Dose Seasonal PF 08/17/2016, 01/04/2017, 09/13/2018   Influenza,inj,Quad PF,6+ Mos 08/19/2014, 08/25/2015   Influenza-Unspecified 09/21/2011, 08/17/2012, 09/21/2013, 09/18/2019, 09/16/2020   PFIZER(Purple Top)SARS-COV-2 Vaccination 01/03/2020, 01/31/2020, 09/20/2020   Pneumococcal Conjugate-13 08/17/2016    Pneumococcal Polysaccharide-23 12/19/2012, 05/18/2018   Td 02/13/2020   Tdap 01/19/2011, 07/02/2013, 01/11/2022    Screening Tests Health Maintenance  Topic Date Due   Medicare Annual Wellness (AWV)  Never done   Diabetic kidney evaluation - Urine ACR  Never done   Zoster Vaccines- Shingrix (1 of 2) Never done   OPHTHALMOLOGY EXAM  01/07/2023   COVID-19 Vaccine (4 - 2024-25 season) 07/10/2023   Hepatitis C Screening  05/16/2026 (Originally 07/01/1969)   INFLUENZA VACCINE  06/08/2024   HEMOGLOBIN A1C  10/24/2024   Diabetic kidney evaluation - eGFR measurement  11/15/2024   FOOT EXAM  01/09/2025   DTaP/Tdap/Td (5 - Td or Tdap) 01/12/2032   Colonoscopy  12/08/2032   Pneumococcal Vaccine: 50+ Years  Completed   Hepatitis B Vaccines  Aged Out   HPV VACCINES  Aged Out   Meningococcal B Vaccine  Aged Out    Health Maintenance  Health Maintenance Due  Topic Date Due   Medicare Annual Wellness (AWV)  Never done   Diabetic kidney evaluation - Urine ACR  Never done   Zoster Vaccines- Shingrix (1 of 2) Never done   OPHTHALMOLOGY EXAM  01/07/2023   COVID-19  Vaccine (4 - 2024-25 season) 07/10/2023   Health Maintenance Items Addressed:   Additional Screening:  Vision Screening: Recommended annual ophthalmology exams for early detection of glaucoma and other disorders of the eye. Would you like a referral to an eye doctor? No    Dental Screening: Recommended annual dental exams for proper oral hygiene  Community Resource Referral / Chronic Care Management: CRR required this visit?  No   CCM required this visit?  No   Plan:    I have personally reviewed and noted the following in the patient's chart:   Medical and social history Use of alcohol, tobacco or illicit drugs  Current medications and supplements including opioid prescriptions. Patient is not currently taking opioid prescriptions. Functional ability and status Nutritional status Physical activity Advanced  directives List of other physicians Hospitalizations, surgeries, and ER visits in previous 12 months Vitals Screenings to include cognitive, depression, and falls Referrals and appointments  In addition, I have reviewed and discussed with patient certain preventive protocols, quality metrics, and best practice recommendations. A written personalized care plan for preventive services as well as general preventive health recommendations were provided to patient.   Samuel [name] Right, [place]   05/23/2024   After Visit Summary: (MyChart) Due to this being a telephonic visit, the after visit summary with patients personalized plan was offered to patient via MyChart   Notes: Nothing significant to report at this time.

## 2024-05-23 NOTE — Patient Instructions (Signed)
 Mr. Ledwith , Thank you for taking time out of your busy schedule to complete your Annual Wellness Visit with me. I enjoyed our conversation and look forward to speaking with you again next year. I, as well as your care team,  appreciate your ongoing commitment to your health goals. Please review the following plan we discussed and let me know if I can assist you in the future. Your Game plan/ To Do List    Referrals: If you haven't heard from the office you've been referred to, please reach out to them at the phone provided.  none Follow up Visits: Next Medicare AWV with our clinical staff: 05/29/2025   Have you seen your provider in the last 6 months (3 months if uncontrolled diabetes)? Yes Next Office Visit with your provider: n/a  Clinician Recommendations:  Aim for 30 minutes of exercise or brisk walking, 6-8 glasses of water , and 5 servings of fruits and vegetables each day.       This is a list of the screening recommended for you and due dates:  Health Maintenance  Topic Date Due   Medicare Annual Wellness Visit  Never done   Yearly kidney health urinalysis for diabetes  Never done   Zoster (Shingles) Vaccine (1 of 2) Never done   Eye exam for diabetics  01/07/2023   COVID-19 Vaccine (4 - 2024-25 season) 07/10/2023   Hepatitis C Screening  05/16/2026*   Flu Shot  06/08/2024   Hemoglobin A1C  10/24/2024   Yearly kidney function blood test for diabetes  11/15/2024   Complete foot exam   01/09/2025   DTaP/Tdap/Td vaccine (5 - Td or Tdap) 01/12/2032   Colon Cancer Screening  12/08/2032   Pneumococcal Vaccine for age over 36  Completed   Hepatitis B Vaccine  Aged Out   HPV Vaccine  Aged Out   Meningitis B Vaccine  Aged Out  *Topic was postponed. The date shown is not the original due date.    Advanced directives: (Declined) Advance directive discussed with you today. Even though you declined this today, please call our office should you change your mind, and we can give you the  proper paperwork for you to fill out. Advance Care Planning is important because it:  [x]  Makes sure you receive the medical care that is consistent with your values, goals, and preferences  [x]  It provides guidance to your family and loved ones and reduces their decisional burden about whether or not they are making the right decisions based on your wishes.  Follow the link provided in your after visit summary or read over the paperwork we have mailed to you to help you started getting your Advance Directives in place. If you need assistance in completing these, please reach out to us  so that we can help you!  See attachments for Preventive Care and Fall Prevention Tips.

## 2024-05-29 ENCOUNTER — Other Ambulatory Visit: Payer: Self-pay | Admitting: Medical

## 2024-06-08 DIAGNOSIS — H43393 Other vitreous opacities, bilateral: Secondary | ICD-10-CM | POA: Diagnosis not present

## 2024-06-08 DIAGNOSIS — H0288B Meibomian gland dysfunction left eye, upper and lower eyelids: Secondary | ICD-10-CM | POA: Diagnosis not present

## 2024-06-08 DIAGNOSIS — H02834 Dermatochalasis of left upper eyelid: Secondary | ICD-10-CM | POA: Diagnosis not present

## 2024-06-08 DIAGNOSIS — H524 Presbyopia: Secondary | ICD-10-CM | POA: Diagnosis not present

## 2024-06-08 DIAGNOSIS — E119 Type 2 diabetes mellitus without complications: Secondary | ICD-10-CM | POA: Diagnosis not present

## 2024-06-08 DIAGNOSIS — H0288A Meibomian gland dysfunction right eye, upper and lower eyelids: Secondary | ICD-10-CM | POA: Diagnosis not present

## 2024-06-08 DIAGNOSIS — Z794 Long term (current) use of insulin: Secondary | ICD-10-CM | POA: Diagnosis not present

## 2024-06-08 DIAGNOSIS — H02831 Dermatochalasis of right upper eyelid: Secondary | ICD-10-CM | POA: Diagnosis not present

## 2024-06-08 DIAGNOSIS — H11153 Pinguecula, bilateral: Secondary | ICD-10-CM | POA: Diagnosis not present

## 2024-06-08 DIAGNOSIS — Z7984 Long term (current) use of oral hypoglycemic drugs: Secondary | ICD-10-CM | POA: Diagnosis not present

## 2024-06-08 DIAGNOSIS — Z961 Presence of intraocular lens: Secondary | ICD-10-CM | POA: Diagnosis not present

## 2024-06-08 DIAGNOSIS — H52203 Unspecified astigmatism, bilateral: Secondary | ICD-10-CM | POA: Diagnosis not present

## 2024-06-08 LAB — HM DIABETES EYE EXAM

## 2024-07-25 ENCOUNTER — Other Ambulatory Visit: Payer: Self-pay | Admitting: Endocrinology

## 2024-07-25 DIAGNOSIS — E1165 Type 2 diabetes mellitus with hyperglycemia: Secondary | ICD-10-CM

## 2024-08-02 ENCOUNTER — Ambulatory Visit: Payer: Self-pay | Admitting: Endocrinology

## 2024-08-02 ENCOUNTER — Encounter: Payer: Self-pay | Admitting: Endocrinology

## 2024-08-02 ENCOUNTER — Ambulatory Visit (INDEPENDENT_AMBULATORY_CARE_PROVIDER_SITE_OTHER): Admitting: Endocrinology

## 2024-08-02 VITALS — BP 128/60 | HR 68 | Resp 20 | Ht 65.0 in | Wt 194.8 lb

## 2024-08-02 DIAGNOSIS — E782 Mixed hyperlipidemia: Secondary | ICD-10-CM

## 2024-08-02 DIAGNOSIS — E1165 Type 2 diabetes mellitus with hyperglycemia: Secondary | ICD-10-CM

## 2024-08-02 DIAGNOSIS — Z794 Long term (current) use of insulin: Secondary | ICD-10-CM

## 2024-08-02 LAB — POCT GLYCOSYLATED HEMOGLOBIN (HGB A1C): Hemoglobin A1C: 7.3 % — AB (ref 4.0–5.6)

## 2024-08-02 NOTE — Progress Notes (Signed)
 Outpatient Endocrinology Note [place] Jhaniya Briski, MD  08/02/24  Patient Name: Samuel [name]   DOB : [birth year] MRN # [id number]                                                    REASON OF VISIT: Follow up for type 2 diabetes mellitus  PCP: Dorina Loving, PA-C  HISTORY OF PRESENT ILLNESS:   Samuel [name] is a 73 y.o. old male with past medical history listed below, is here for follow up for type 2 diabetes mellitus.   Pertinent Diabetes History:  _Diagnosed as type 2 in 1990.  Patient was initially treated with metformin  and later insulin  added.  He was mostly on insulin  70/30 regimen in the past.  At some point he was also on basal bolus insulin  with Toujeo  and Humalog .  Lately he has been on insulin  pump therapy.  OmniPod Dash insulin  pump started in 2018/2019.  Chronic Diabetes Complications : Retinopathy: no. Last ophthalmology exam was done on annually, reportedly. Nephropathy: no, on losartan  Peripheral neuropathy: no Coronary artery disease: no Stroke: no  Relevant comorbidities and cardiovascular risk factors: Obesity: yes Body mass index is 32.42 kg/m.  Hypertension: Yes Hyperlipidemia.  Yes, on statin.  Current / Home Diabetic regimen includes: Omnipod 4 / Dash /not with sensor.  He uses Humalog  U100.  Synjardy  03-999 mg 2 tab daily.  Trulicity  4.5 mg weekly.  Insulin  Pump setting:  Basal ( 28.8 units/day) MN-    0.9 u/hour 2:30AM-  0.9 8AM-    1.4   10 PM- 1.0       Bolus CHO Ratio (1unit:CHO) MN- 1:1 ( using fixed manual bolus 8-16 units)  Correction/Sensitivity: MN- 1:25  Target: 130   Active insulin  time: 4 hours  Prior diabetic medications: Insulin  70/30, Toujeo   INSULIN  PUMP DATA:                         Average total daily insulin : 52.9 units, Basal: 50%, Bolus: 50 %  He does not use sensor with OmniPod.  He does not do carb counting.  He uses fixed dose bolus from 6 to 16 units depending upon the blood sugar and food size with meals,  1-4 times a day.   Glycemic data:    CONTINUOUS GLUCOSE MONITORING SYSTEM (CGMS) INTERPRETATION:  FreeStyle Libre 2 CGM-  Sensor Download (Sensor download was reviewed and summarized below.) Dates: September 12 to August 02, 2024, 14 days. Sensor Average: 152 Glucose Management Indicator: 6.9%  % data captured: 98%      Interpretation: Mostly acceptable blood sugar.  Occasional mild hyperglycemia with blood sugar up to 200 range and rare hyperglycemia with blood sugar up to 250-3 100s range postprandially.  No concerning hypoglycemia.  We did review of the pump data he has been bolusing mostly 3 times a day and occasionally 4-6 times a day based on frequency of eating.  Hypoglycemia: Patient has no concerning hypoglycemic episodes. Patient has hypoglycemia awareness.    Factors modifying glucose control: 1.  Diabetic diet assessment: Almost always eats a bedtime snack.  Supper around 6 PM.  2.  Staying active or exercising: Occasionally walk.  3.  Medication compliance: compliant all of the time.  Interval history Insulin  pump and CGM data as reviewed above.  Mostly acceptable blood  sugar.  Hemoglobin A1c improved to 7.3%.  He has also been taking Synjardy  and Trulicity , denies any symptoms.  No other complaints today.  Video Spanish language interpretation used in the clinic visit today.  REVIEW OF SYSTEMS As per history of present illness.   PAST MEDICAL HISTORY: Past Medical History:  Diagnosis Date   Arthritis    Blood transfusion without reported diagnosis    Diabetes mellitus without complication (HCC)    type II   History of ETOH abuse    Hypertension     PAST SURGICAL HISTORY: Past Surgical History:  Procedure Laterality Date   BACK SURGERY N/A 11/08/2008   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   Procedure: LEFT TOTAL KNEE ARTHROPLASTY;  Surgeon: Vernetta Lonni GRADE, MD;  Location: [facility];  Service: Orthopedics;  Laterality: Left;   TOTAL  KNEE ARTHROPLASTY Right 11/15/2023   Procedure: RIGHT TOTAL KNEE ARTHROPLASTY;  Surgeon: Vernetta Lonni GRADE, MD;  Location: [facility];  Service: Orthopedics;  Laterality: Right;    ALLERGIES: No Known Allergies  FAMILY HISTORY:  Family History  Problem Relation Age of Onset   Arthritis Father    Diabetes Father    Diabetes Sister    Diabetes Other    Hypertension Other    Diabetes Child     SOCIAL HISTORY: Social History   Socioeconomic History   Marital status: Married    Spouse name: Not on file   Number of children: Not on file   Years of education: Not on file   Highest education level: 3rd grade  Occupational History   Occupation: retired    Comment: maintenanc  Tobacco Use   Smoking status: Former   Smokeless tobacco: Former    Quit date: 11/08/1993  Vaping Use   Vaping status: Former  Substance and Sexual Activity   Alcohol use: Yes    Comment: occasional   Drug use: No   Sexual activity: Not on file  Other Topics Concern   Not on file  Social History Narrative   Not on file   Social Drivers of Health   Financial Resource Strain: Low Risk  (05/23/2024)   Overall Financial Resource Strain (CARDIA)    Difficulty of Paying Living Expenses: Not hard at all  Food Insecurity: No Food Insecurity (05/23/2024)   Hunger Vital Sign    Worried About Running Out of Food in the Last Year: Never true    Ran Out of Food in the Last Year: Never true  Transportation Needs: No Transportation Needs (05/23/2024)   PRAPARE - Administrator, Civil Service (Medical): No    Lack of Transportation (Non-Medical): No  Physical Activity: Insufficiently Active (05/23/2024)   Exercise Vital Sign    Days of Exercise per Week: 2 days    Minutes of Exercise per Session: 20 min  Stress: No Stress Concern Present (05/23/2024)   [employer] of Occupational Health - Occupational Stress Questionnaire    Feeling of Stress: Not at all  Social Connections: Socially  Integrated (05/23/2024)   Social Connection and Isolation Panel    Frequency of Communication with Friends and Family: More than three times a week    Frequency of Social Gatherings with Friends and Family: Twice a week    Attends Religious Services: 1 to 4 times per year    Active Member of [employer] or Organizations: Yes    Attends Banker Meetings: 1 to 4 times per year    Marital Status: Married  MEDICATIONS:  Current Outpatient Medications  Medication Sig Dispense Refill   aspirin  81 MG chewable tablet Chew 1 tablet (81 mg total) by mouth 2 (two) times daily. 35 tablet 0   atorvastatin  (LIPITOR) 40 MG tablet TAKE 1 TABLET BY MOUTH EVERY MORNING AND 1/2 AT BEDTIME 135 tablet 1   B-D UF III MINI PEN NEEDLES 31G X 5 MM MISC USE FIVE PER DAY AS DIRECTED 200 each 0   Blood Glucose Monitoring Suppl (ONETOUCH VERIO REFLECT) w/Device KIT PLEASE USE WITH GLUCOMETER MACHINE TO CHECK BLOOD GLUCOSE LEVELS AS NEEDED, FOR BACKUP OF CGM 1 kit 0   Continuous Glucose Receiver (FREESTYLE LIBRE 2 READER) DEVI Use with sensor to track blood glucose 1 each 0   Continuous Glucose Sensor (FREESTYLE LIBRE 2 PLUS SENSOR) MISC Change sensor every 15  days. 6 each 3   Dulaglutide  (TRULICITY ) 4.5 MG/0.5ML SOAJ Inject 4.5 mg as directed once a week. 6 mL 3   Empagliflozin -metFORMIN  HCl ER (SYNJARDY  XR) 03-999 MG TB24 Take 2 tablets by mouth daily. 180 tablet 3   fluticasone  (FLONASE ) 50 MCG/ACT nasal spray Place 2 sprays into both nostrils daily. 16 g 1   Glucagon  (GVOKE HYPOPEN  1-PACK) 1 MG/0.2ML SOAJ Inject 1 mg into the skin as needed (low blood sugar with impaired consciousness). 0.4 mL 2   glucose blood (FREESTYLE LITE) test strip USE AS INSTRUCTED FOUR TIMES DAILY 150 strip 2   glucose blood test strip Use as instructed 100 each 12   Insulin  Disposable Pump (OMNIPOD DASH INTRO, GEN 4,) KIT Change every 3 days 1 kit 0   Insulin  Disposable Pump (OMNIPOD DASH PODS, GEN 4,) MISC USE FOR INSULIN  PUMP  EVEY 3 DAYS 30 each 5   insulin  lispro (HUMALOG ) 100 UNIT/ML injection Max daily 125 units per pump 100 mL 3   Insulin  Pen Needle 32G X 4 MM MISC Use 5 pens per day to inject insulin  200 each 3   Insulin  Syringe-Needle U-100 (INSULIN  SYRINGE .5CC/30GX5/16) 30G X 5/16 0.5 ML MISC Use 3 times a day for insulin  300 each 1   levocetirizine (XYZAL ) 5 MG tablet Take 1 tablet (5 mg total) by mouth every evening. 30 tablet 3   levocetirizine (XYZAL ) 5 MG tablet Take 1 tablet (5 mg total) by mouth every evening. 90 tablet 3   losartan  (COZAAR ) 50 MG tablet TAKE 1 TABLET(50 MG) BY MOUTH DAILY 90 tablet 1   methocarbamol  (ROBAXIN ) 500 MG tablet Take 1 tablet (500 mg total) by mouth every 6 (six) hours as needed for muscle spasms. 40 tablet 1   OVER THE COUNTER MEDICATION Take 2 tablets by mouth daily. 4LIFE TRANSFER FACTORY RECALL     primidone  (MYSOLINE ) 50 MG tablet Take 1 tablet (50 mg total) by mouth 2 (two) times daily. 180 tablet 1   No current facility-administered medications for this visit.    PHYSICAL EXAM: Vitals:   08/02/24 1500  BP: 128/60  Pulse: 68  Resp: 20  SpO2: 93%  Weight: 194 lb 12.8 oz (88.4 kg)  Height: 5' 5 (1.651 m)     Body mass index is 32.42 kg/m.  Wt Readings from Last 3 Encounters:  08/02/24 194 lb 12.8 oz (88.4 kg)  05/23/24 196 lb (88.9 kg)  05/15/24 196 lb 6.4 oz (89.1 kg)    General: Well developed, well nourished male in no apparent distress.  HEENT: AT/[place], no external lesions.  Eyes: Conjunctiva clear and no icterus. Neck: Neck supple  Lungs: Respirations not labored Neurologic:  Alert, oriented, normal speech Extremities / Skin: Dry.   Psychiatric: Does not appear depressed or anxious  Diabetic Foot Exam - Simple   No data filed    LABS Reviewed Lab Results  Component Value Date   HGBA1C 7.3 (A) 08/02/2024   HGBA1C 7.6 (A) 04/24/2024   HGBA1C 7.1 (A) 01/10/2024   Lab Results  Component Value Date   FRUCTOSAMINE 302 (H) 07/20/2017    Lab Results  Component Value Date   CHOL 84 08/15/2023   HDL 48.70 08/15/2023   LDLCALC 14 08/15/2023   LDLDIRECT 44.0 01/24/2020   TRIG 107.0 08/15/2023   CHOLHDL 2 08/15/2023   No results found for: [facility]  Lab Results  Component Value Date   CREATININE 0.95 11/16/2023   Lab Results  Component Value Date   GFR 86.83 06/22/2023    ASSESSMENT / PLAN  1. Uncontrolled type 2 diabetes mellitus with hyperglycemia, with long-term current use of insulin  (HCC)   2. Mixed hyperlipidemia    Diagnoses and all orders for this visit:  Uncontrolled type 2 diabetes mellitus with hyperglycemia, with long-term current use of insulin  (HCC) -     POCT glycosylated hemoglobin (Hb A1C) -     Microalbumin / creatinine urine ratio -     Lipid panel -     Basic metabolic panel with GFR  Mixed hyperlipidemia   Diabetes Mellitus type 2  - Diabetic status / severity: uncontrolled improving.  Lab Results  Component Value Date   HGBA1C 7.3 (A) 08/02/2024    - Hemoglobin A1c goal < 7% range.   - Medications:   OmniPod Dash using Humalog  U100.  -No change on the pump setting today.  Discussed about adjusting fixed bolus of insulin  for his meals 6 to 16 units based on meal size and blood sugar normal at that time. He does not carb count.  Advised to take bolus insulin  less by 2 units when he has increased physical activity for example walking.  -Continue Synjardy  03-999 mg 2 tab daily.   -Continue Trulicity  4.5 mg weekly.  - Home glucose testing: continue CGM and check blood glucose as needed.   - Discussed/ Gave Hypoglycemia treatment plan.  # Consult : not required at this time.   # Annual urine for microalbuminuria/ creatinine ratio, no microalbuminuria currently, continue losartan  /Synjardy .  Will check today.  Last  No results found for: MICRALBCREAT   # Foot check nightly.  # Annual dilated diabetic eye exams.   - Diet: Make healthy diabetic food  choices, advised to avoid high carbohydrate meal and sweets and sugary meals. - Life style / activity / exercise: Discussed.  Advised for walking and increase physical activity.  Blood pressure  -  BP Readings from Last 1 Encounters:  08/02/24 128/60    - Control is in target.  - No change in current plans.  Lipid status / Hyperlipidemia - Last  Lab Results  Component Value Date   LDLCALC 14 08/15/2023   - Continue atorvastatin  40 mg daily. - Will check lipid panel today.   DISPOSITION Follow up in clinic in 3 months suggested.  Labs today as ordered.   All questions answered and patient verbalized understanding of the plan.  [place] Jennetta Flood, MD [facility] [address] [place], [address] [place], [place] [address] Phone # [phone]  At least part of this note was generated using voice recognition software. Inadvertent word errors may have occurred, which were not recognized during the  proofreading process.

## 2024-08-03 LAB — BASIC METABOLIC PANEL WITH GFR
BUN: 18 mg/dL (ref 7–25)
CO2: 30 mmol/L (ref 20–32)
Calcium: 9.5 mg/dL (ref 8.6–10.3)
Chloride: 101 mmol/L (ref 98–110)
Creat: 0.77 mg/dL (ref 0.70–1.28)
Glucose, Bld: 129 mg/dL (ref 65–139)
Potassium: 4.2 mmol/L (ref 3.5–5.3)
Sodium: 140 mmol/L (ref 135–146)
eGFR: 95 mL/min/1.73m2 (ref >=60)

## 2024-08-03 LAB — LIPID PANEL
Cholesterol: 98 mg/dL (ref <200)
HDL: 44 mg/dL (ref >=40)
LDL Cholesterol (Calc): 34 mg/dL
Non-HDL Cholesterol (Calc): 54 mg/dL (ref <130)
Total CHOL/HDL Ratio: 2.2 (calc) (ref <5.0)
Triglycerides: 114 mg/dL (ref <150)

## 2024-08-03 LAB — MICROALBUMIN / CREATININE URINE RATIO
Creatinine, Urine: 66 mg/dL (ref 20–320)
Microalb, Ur: <0.2 mg/dL

## 2024-08-28 ENCOUNTER — Encounter: Payer: Self-pay | Admitting: Pharmacist

## 2024-08-28 ENCOUNTER — Other Ambulatory Visit: Payer: Self-pay | Admitting: Pharmacist

## 2024-08-28 ENCOUNTER — Encounter: Payer: Self-pay | Admitting: Medical

## 2024-08-28 MED ORDER — ATORVASTATIN CALCIUM 40 MG PO TABS: ORAL_TABLET | ORAL | 0 refills | Status: DC

## 2024-08-28 NOTE — Progress Notes (Signed)
 Pharmacy Quality Measure Review   This patient is appearing on a report for being at risk of failing the adherence measure for cholesterol (statin) and hypertension (ACEi/ARB) medications this calendar year.   Medication: losartan   Last fill date: 05/31/2024 for 90 day supply - has 1 refill remaining  Medication: atorvastatin   Last fill date: 07/23/205 for 90 day supply - no refills remaining  Patient does not currently have a follow up scheduled with PCP. He was seen for acute visit in June 2025. Will check with PCP about recommended follow-up. Patient also was noted to need eye exam but looks like he has diabetic eye exam at [facility] 06/08/2024 - forwarded to Luke Callas in our office to document / abstract OV for eye exam.   Contacted pharmacy to facilitate refills. and Will collaborate with provider to facilitate refill needs.  Madelin Ray, PharmD Clinical Pharmacist [facility]  Population Health [phone]

## 2024-08-30 NOTE — Progress Notes (Signed)
 Pt has a follow up 08/31/24

## 2024-08-31 ENCOUNTER — Ambulatory Visit (INDEPENDENT_AMBULATORY_CARE_PROVIDER_SITE_OTHER): Admitting: Medical

## 2024-08-31 ENCOUNTER — Ambulatory Visit (HOSPITAL_BASED_OUTPATIENT_CLINIC_OR_DEPARTMENT_OTHER)
Admission: RE | Admit: 2024-08-31 | Discharge: 2024-08-31 | Disposition: A | Discharge status: Home / Self Care | Source: Ambulatory Visit | Attending: Medical | Admitting: Medical

## 2024-08-31 ENCOUNTER — Other Ambulatory Visit: Payer: Self-pay | Admitting: Medical

## 2024-08-31 VITALS — BP 128/70 | HR 67 | Temp 98.3°F | Resp 16 | Ht 65.0 in | Wt 198.4 lb

## 2024-08-31 DIAGNOSIS — H00015 Hordeolum externum left lower eyelid: Secondary | ICD-10-CM | POA: Diagnosis not present

## 2024-08-31 DIAGNOSIS — E119 Type 2 diabetes mellitus without complications: Secondary | ICD-10-CM | POA: Diagnosis not present

## 2024-08-31 DIAGNOSIS — R059 Cough, unspecified: Secondary | ICD-10-CM | POA: Diagnosis not present

## 2024-08-31 DIAGNOSIS — Z87891 Personal history of nicotine dependence: Secondary | ICD-10-CM | POA: Diagnosis not present

## 2024-08-31 DIAGNOSIS — Z794 Long term (current) use of insulin: Secondary | ICD-10-CM | POA: Diagnosis not present

## 2024-08-31 DIAGNOSIS — R062 Wheezing: Secondary | ICD-10-CM | POA: Diagnosis not present

## 2024-08-31 DIAGNOSIS — Z981 Arthrodesis status: Secondary | ICD-10-CM | POA: Diagnosis not present

## 2024-08-31 DIAGNOSIS — E785 Hyperlipidemia, unspecified: Secondary | ICD-10-CM

## 2024-08-31 DIAGNOSIS — I10 Essential (primary) hypertension: Secondary | ICD-10-CM | POA: Diagnosis not present

## 2024-08-31 MED ORDER — ATORVASTATIN CALCIUM 40 MG PO TABS: ORAL_TABLET | ORAL | 3 refills | Status: DC

## 2024-08-31 MED ORDER — LOSARTAN POTASSIUM 50 MG PO TABS: 50.0000 mg | ORAL_TABLET | Freq: Every day | ORAL | 3 refills | Status: DC

## 2024-08-31 MED ORDER — MONTELUKAST SODIUM 10 MG PO TABS: 10.0000 mg | ORAL_TABLET | Freq: Every day | ORAL | 0 refills | Status: AC

## 2024-08-31 MED ORDER — TOBRAMYCIN 0.3 % OP SOLN: 2.0000 [drp] | Freq: Four times a day (QID) | OPHTHALMIC | 0 refills | Status: AC

## 2024-08-31 MED ORDER — ALBUTEROL SULFATE HFA 108 (90 BASE) MCG/ACT IN AERS: 2.0000 | INHALATION_SPRAY | Freq: Four times a day (QID) | RESPIRATORY_TRACT | 0 refills | Status: AC | PRN

## 2024-08-31 NOTE — Patient Instructions (Addendum)
 Wheezing and cough Intermittent wheezing and cough for three weeks, likely allergic or postnasal drip related. Possible asthma. - Prescribe albuterol  inhaler, two inhalations before bedtime for five days. - Order chest X-ray.  Allergic rhinitis Symptoms consistent with allergic rhinitis. - Continue Xyzal . - Prescribe montelukast, one tablet at night.  Left eyelid swelling(residual stye) Improved with warm compresses, no prescription drops used. - Continue warm compresses. - Consider ophthalmology referral if symptoms persist. - Use over-the-counter eye drops for five to seven days if needed. - Re-evaluate in 10-14 days.  Type 2 diabetes mellitus Managed by endocrinology visit showed good results, HbA1c was over 7. Goal less than 7 per endocrine note.  Hypertension Blood pressure well-controlled at 128/70 mmHg, target <130/80 mmHg due to diabetes. - Prescribe losartan  with 90 tablets and three refills.  Hyperlipidemia Cholesterol well-controlled with atorvastatin  40 mg, recent lipid panel satisfactory. - Prescribe atorvastatin  40 mg with 90 tablets and three refills.  General Health Maintenance Received flu vaccine two weeks ago.  Follow 10-14 days to recheck left eye

## 2024-08-31 NOTE — Progress Notes (Addendum)
 Subjective:    Patient ID: Samuel [name], male    DOB: 07-19-1951, 73 y.o.   MRN: [id number]  HPI  Samuel [name] is a 73 year old male with diabetes and hypertension who presents with respiratory symptoms and medication management. He is accompanied by his daughter.  He has been experiencing nocturnal wheezing for the past three weeks. He denies a history of asthma but recalls a past episode many years ago. He describes a sensation of a blocked throat and has been using over-the-counter cough syrup, which has provided some relief. He also reports persistent rhinorrhea, described as 'like water ', and has been taking Xyzal  for allergies.  He has diabetes and is currently taking Cigarty. He mentions an issue with receiving an extra bottle of medication from the pharmacy, which he has not yet used up. His recent blood sugar level was 7.3.  He has hypertension and is currently taking losartan . He also takes atorvastatin  40 mg daily for cholesterol management.  He has a history of smoking for over 35 years, having quit five years ago. He smoked up to two packs a day during the last years of his smoking period.  He mentions an eye issue where he experienced swelling and infection-like symptoms, which have improved with warm compresses. He has been using non-prescription eye drops that have helped reduce the swelling    2 weeks ago got flu vaccine at pharmacy.     Review of Systems  Constitutional:  Negative for chills and fatigue.  Respiratory:  Negative for cough, chest tightness, shortness of breath and wheezing.   Cardiovascular:  Negative for chest pain and palpitations.  Gastrointestinal:  Negative for abdominal pain, diarrhea and vomiting.  Genitourinary:  Negative for dysuria and frequency.  Musculoskeletal:  Negative for back pain and myalgias.  Skin:  Negative for rash.  Neurological:  Negative for dizziness, syncope, weakness and numbness.  Hematological:  Negative for  adenopathy.  Psychiatric/Behavioral:  Negative for behavioral problems and decreased concentration.     Past Medical History:  Diagnosis Date   Arthritis    Blood transfusion without reported diagnosis    Diabetes mellitus without complication (HCC)    type II   History of ETOH abuse    Hypertension      Social History   Socioeconomic History   Marital status: Married    Spouse name: Not on file   Number of children: Not on file   Years of education: Not on file   Highest education level: 3rd grade  Occupational History   Occupation: retired    Comment: maintenanc  Tobacco Use   Smoking status: Former   Smokeless tobacco: Former    Quit date: 11/08/1993  Vaping Use   Vaping status: Former  Substance and Sexual Activity   Alcohol use: Yes    Comment: occasional   Drug use: No   Sexual activity: Not on file  Other Topics Concern   Not on file  Social History Narrative   Not on file   Social Drivers of Health   Financial Resource Strain: Low Risk  (05/23/2024)   Overall Financial Resource Strain (CARDIA)    Difficulty of Paying Living Expenses: Not hard at all  Food Insecurity: No Food Insecurity (05/23/2024)   Hunger Vital Sign    Worried About Running Out of Food in the Last Year: Never true    Ran Out of Food in the Last Year: Never true  Transportation Needs: No Transportation Needs (05/23/2024)  PRAPARE - Administrator, Civil Service (Medical): No    Lack of Transportation (Non-Medical): No  Physical Activity: Insufficiently Active (05/23/2024)   Exercise Vital Sign    Days of Exercise per Week: 2 days    Minutes of Exercise per Session: 20 min  Stress: No Stress Concern Present (05/23/2024)   [employer] of Occupational Health - Occupational Stress Questionnaire    Feeling of Stress: Not at all  Social Connections: Socially Integrated (05/23/2024)   Social Connection and Isolation Panel    Frequency of Communication with Friends and Family:  More than three times a week    Frequency of Social Gatherings with Friends and Family: Twice a week    Attends Religious Services: 1 to 4 times per year    Active Member of [employer] or Organizations: Yes    Attends Banker Meetings: 1 to 4 times per year    Marital Status: Married  Catering Manager Violence: Not At Risk (05/23/2024)   Humiliation, Afraid, Rape, and Kick questionnaire    Fear of Current or Ex-Partner: No    Emotionally Abused: No    Physically Abused: No    Sexually Abused: No    Past Surgical History:  Procedure Laterality Date   BACK SURGERY N/A 11/08/2008   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   Procedure: LEFT TOTAL KNEE ARTHROPLASTY;  Surgeon: Vernetta Lonni GRADE, MD;  Location: [facility];  Service: Orthopedics;  Laterality: Left;   TOTAL KNEE ARTHROPLASTY Right 11/15/2023   Procedure: RIGHT TOTAL KNEE ARTHROPLASTY;  Surgeon: Vernetta Lonni GRADE, MD;  Location: [facility];  Service: Orthopedics;  Laterality: Right;    Family History  Problem Relation Age of Onset   Arthritis Father    Diabetes Father    Diabetes Sister    Diabetes Other    Hypertension Other    Diabetes Child     No Known Allergies  Current Outpatient Medications on File Prior to Visit  Medication Sig Dispense Refill   aspirin  81 MG chewable tablet Chew 1 tablet (81 mg total) by mouth 2 (two) times daily. 35 tablet 0   B-D UF III MINI PEN NEEDLES 31G X 5 MM MISC USE FIVE PER DAY AS DIRECTED 200 each 0   Blood Glucose Monitoring Suppl (ONETOUCH VERIO REFLECT) w/Device KIT PLEASE USE WITH GLUCOMETER MACHINE TO CHECK BLOOD GLUCOSE LEVELS AS NEEDED, FOR BACKUP OF CGM 1 kit 0   Continuous Glucose Receiver (FREESTYLE LIBRE 2 READER) DEVI Use with sensor to track blood glucose 1 each 0   Continuous Glucose Sensor (FREESTYLE LIBRE 2 PLUS SENSOR) MISC Change sensor every 15  days. 6 each 3   Dulaglutide  (TRULICITY ) 4.5 MG/0.5ML SOAJ Inject 4.5 mg as directed once a week. 6 mL 3    Empagliflozin -metFORMIN  HCl ER (SYNJARDY  XR) 03-999 MG TB24 Take 2 tablets by mouth daily. 180 tablet 3   fluticasone  (FLONASE ) 50 MCG/ACT nasal spray Place 2 sprays into both nostrils daily. 16 g 1   Glucagon  (GVOKE HYPOPEN  1-PACK) 1 MG/0.2ML SOAJ Inject 1 mg into the skin as needed (low blood sugar with impaired consciousness). 0.4 mL 2   glucose blood (FREESTYLE LITE) test strip USE AS INSTRUCTED FOUR TIMES DAILY 150 strip 2   glucose blood test strip Use as instructed 100 each 12   Insulin  Disposable Pump (OMNIPOD DASH INTRO, GEN 4,) KIT Change every 3 days 1 kit 0   Insulin  Disposable Pump (OMNIPOD DASH PODS, GEN 4,) MISC USE FOR INSULIN   PUMP EVEY 3 DAYS 30 each 5   insulin  lispro (HUMALOG ) 100 UNIT/ML injection Max daily 125 units per pump 100 mL 3   Insulin  Pen Needle 32G X 4 MM MISC Use 5 pens per day to inject insulin  200 each 3   Insulin  Syringe-Needle U-100 (INSULIN  SYRINGE .5CC/30GX5/16) 30G X 5/16 0.5 ML MISC Use 3 times a day for insulin  300 each 1   levocetirizine (XYZAL ) 5 MG tablet Take 1 tablet (5 mg total) by mouth every evening. 30 tablet 3   levocetirizine (XYZAL ) 5 MG tablet Take 1 tablet (5 mg total) by mouth every evening. 90 tablet 3   losartan  (COZAAR ) 50 MG tablet TAKE 1 TABLET(50 MG) BY MOUTH DAILY 90 tablet 1   methocarbamol  (ROBAXIN ) 500 MG tablet Take 1 tablet (500 mg total) by mouth every 6 (six) hours as needed for muscle spasms. 40 tablet 1   OVER THE COUNTER MEDICATION Take 2 tablets by mouth daily. 4LIFE TRANSFER FACTORY RECALL     primidone  (MYSOLINE ) 50 MG tablet Take 1 tablet (50 mg total) by mouth 2 (two) times daily. 180 tablet 1   No current facility-administered medications on file prior to visit.    BP 128/70   Pulse 67   Temp 98.3 F (36.8 C) (Oral)   Resp 16   Ht 5' 5 (1.651 m)   Wt 198 lb 6.4 oz (90 kg)   SpO2 95%   BMI 33.02 kg/m        Objective:   Physical Exam  General Mental Status- Alert. General Appearance- Not in acute  distress.   Skin General: Color- Normal Color. Moisture- Normal Moisture.  Neck Carotid Arteries- Normal color. Moisture- Normal Moisture. No carotid bruits. No JVD.  Chest and Lung Exam Auscultation: Breath Sounds:-Normal.  Cardiovascular Auscultation:Rythm- Regular. Murmurs & Other Heart Sounds:Auscultation of the heart reveals- No Murmurs.  Abdomen Inspection:-Inspeection Normal. Palpation/Percussion:Note:No mass. Palpation and Percussion of the abdomen reveal- Non Tender, Non Distended + BS, no rebound or guarding.   Neurologic Cranial Nerve exam:- CN III-XII intact(No nystagmus), symmetric smile. Strength:- 5/5 equal and symmetric strength both upper and lower extremities.   Eyes- left lower eye lid medial aspect appearance of residual stye.  Heent- pnd no sinus pressure.     Assessment & Plan:   Wheezing and cough Intermittent wheezing and cough for three weeks, likely allergic or postnasal drip related. Possible asthma. - Prescribe albuterol  inhaler, two inhalations before bedtime for five days. - Order chest X-ray.  Allergic rhinitis Symptoms consistent with allergic rhinitis. - Continue Xyzal . - Prescribe montelukast, one tablet at night.  Left eyelid swelling(residual stye) Improved with warm compresses, no prescription drops used. - Continue warm compresses. - Consider ophthalmology referral if symptoms persist. - Use over-the-counter eye drops for five to seven days if needed. - Re-evaluate in 10-14 days.  Type 2 diabetes mellitus Managed by endocrinology visit showed good results, HbA1c was over 7. Goal less than 7 per endocrine note.  Hypertension Blood pressure well-controlled at 128/70 mmHg, target <130/80 mmHg due to diabetes. - Prescribe losartan  with 90 tablets and three refills.  Hyperlipidemia Cholesterol well-controlled with atorvastatin  40 mg, recent lipid panel satisfactory. - Prescribe atorvastatin  40 mg with 90 tablets and three  refills.  General Health Maintenance Received flu vaccine two weeks ago.  Follow 10-14 days to recheck left eye  Dallas Maxwell, PA-C    Time spent with patient today was 41  minutes which consisted of chart review, discussing diagnosis, work up,  treatment and documentation.

## 2024-09-01 ENCOUNTER — Ambulatory Visit: Payer: Self-pay | Admitting: Medical

## 2024-09-10 ENCOUNTER — Encounter: Payer: Self-pay | Admitting: Radiology

## 2024-09-11 ENCOUNTER — Ambulatory Visit: Admitting: Medical

## 2024-09-17 ENCOUNTER — Ambulatory Visit (INDEPENDENT_AMBULATORY_CARE_PROVIDER_SITE_OTHER): Admitting: Medical

## 2024-09-17 VITALS — BP 126/62 | HR 59 | Temp 98.3°F | Resp 16 | Ht 65.0 in | Wt 197.8 lb

## 2024-09-17 DIAGNOSIS — J309 Allergic rhinitis, unspecified: Secondary | ICD-10-CM | POA: Diagnosis not present

## 2024-09-17 DIAGNOSIS — H00015 Hordeolum externum left lower eyelid: Secondary | ICD-10-CM

## 2024-09-17 DIAGNOSIS — I10 Essential (primary) hypertension: Secondary | ICD-10-CM | POA: Diagnosis not present

## 2024-09-17 DIAGNOSIS — G250 Essential tremor: Secondary | ICD-10-CM

## 2024-09-17 NOTE — Patient Instructions (Addendum)
 Stye, resolving with tobramycin  Stye resolving with tobramycin  eye drops. Symptoms improved, no discomfort. - Continue tobramycin  eye drops for four more days.  Allergic rhinitis , improved with Xyzal  and montelukast. Never had to use albuterol  inhaler. - Continue Xyzal  and montelukast as needed. - Use albuterol  inhaler if symptoms worsen.  Essential tremor, managed with primidone  Essential tremor well-controlled with primidone  50 mg twice a day. - Continue primidone  50 mg twice a day.  Type 2 diabetes mellitus, managed by endocrinology Type 2 diabetes mellitus managed by endocrinology. - Continue management with endocrinology.  Hypertension, controlled with losartan  Hypertension well-controlled with losartan . Blood pressure 126/72 mmHg. - Continue losartan  as prescribed.  Hyperlipidemia, controlled with atorvastatin  Hyperlipidemia well-controlled with atorvastatin . - Continue atorvastatin  as prescribed.  Follow up in 6 months or sooner if needed

## 2024-09-17 NOTE — Progress Notes (Addendum)
   Subjective:    Patient ID: Samuel [name], male    DOB: 1951/01/10, 73 y.o.   MRN: [id number]  HPI Presents with improved eye symptoms and cough.  His eye symptoms have improved with the use of tobramycin  eye drops, administered as two drops every six hours. Initially, thought she had a small stye. He continues to use the drops daily, in the morning and at night. The drops are applied to both eyes, although the issue was in left eye.  H experienced a cough with wheezing for three weeks, initially attributing it to allergies and possibly asthma. The cough has resolved with the use of Xyzal  and montelukast. He has not used  albuterol  inhaler but keeps it on hand in case her symptoms worsen.  His  blood pressure is controlled with losartan , and his cholesterol is managed with atorvastatin . He mentions that his arterial pressure is currently 126/72.   He has diabetes, which is managed by her endocrinologist.  He is also being treated for essential tremor by a neurologist, who prescribed primidone  50 mg twice a day, which helps with her symptoms.   Review of Systems  See hpi      Objective:   Physical Exam  General Mental Status- Alert. General Appearance- Not in acute distress.   Skin General: Color- Normal Color. Moisture- Normal Moisture.  Neck  No JVD.  Chest and Lung Exam Auscultation: Breath Sounds:-Normal.  Cardiovascular Auscultation:Rythm- Regular. Murmurs & Other Heart Sounds:Auscultation of the heart reveals- No Murmurs.  Abdomen Inspection:-Inspeection Normal. Palpation/Percussion:Note:No mass. Palpation and Percussion of the abdomen reveal- Non Tender, Non Distended + BS, no rebound or guarding.    Neurologic Cranial Nerve exam:- CN III-XII intact(No nystagmus), symmetric smile. Strength:- 5/5 equal and symmetric strength both upper and lower extremities.   Left eye- lower eye lid stye appears improved. Not obviously present. No dc. Conjunctiva  clear.    Assessment & Plan:   Patient Instructions  Stye, resolving with tobramycin  Stye resolving with tobramycin  eye drops. Symptoms improved, no discomfort. - Continue tobramycin  eye drops for four more days.  Allergic rhinitis , improved with Xyzal  and montelukast. Never had to use albuterol  inhaler. - Continue Xyzal  and montelukast as needed. - Use albuterol  inhaler if symptoms worsen.  Essential tremor, managed with primidone  Essential tremor well-controlled with primidone  50 mg twice a day. - Continue primidone  50 mg twice a day.  Type 2 diabetes mellitus, managed by endocrinology Type 2 diabetes mellitus managed by endocrinology. - Continue management with endocrinology.  Hypertension, controlled with losartan  Hypertension well-controlled with losartan . Blood pressure 126/72 mmHg. - Continue losartan  as prescribed.  Hyperlipidemia, controlled with atorvastatin  Hyperlipidemia well-controlled with atorvastatin . - Continue atorvastatin  as prescribed.  Follow up in 6 months or sooner if needed   [employer], PA-C

## 2024-10-18 ENCOUNTER — Encounter: Payer: Self-pay | Admitting: Orthopaedic Surgery

## 2024-10-18 ENCOUNTER — Other Ambulatory Visit: Payer: Self-pay

## 2024-10-18 ENCOUNTER — Ambulatory Visit: Admitting: Orthopaedic Surgery

## 2024-10-18 DIAGNOSIS — Z96651 Presence of right artificial knee joint: Secondary | ICD-10-CM | POA: Diagnosis not present

## 2024-10-18 NOTE — Progress Notes (Signed)
 The patient is a 73 year old gentleman well-known to us .  He is about 2 years out from a left knee replacement and now 11 months out from a right knee replacement.  He says his knees are doing great.  He is seen with an interpreter today.  He has no issues at all reports good range of motion and strength.  On examination both knees move smoothly and fluidly.  Both knees are ligamentously stable.  Standing AP and lateral of the right knee also shows the AP of the left knee.  Both knees have well-seated press-fit components with no complicating features and no malalignment.  At this point follow-up can be as needed.  However if he does develop any orthopedic issues or any issues with the knees he needs to let us  know immediately.  He agrees as well.  All questions and concerns were addressed and answered.

## 2024-11-12 ENCOUNTER — Encounter: Payer: Self-pay | Admitting: Endocrinology

## 2024-11-12 ENCOUNTER — Ambulatory Visit: Payer: Self-pay | Admitting: Endocrinology

## 2024-11-12 ENCOUNTER — Ambulatory Visit: Admitting: Endocrinology

## 2024-11-12 VITALS — BP 128/64 | HR 72 | Resp 16 | Ht 65.0 in | Wt 198.8 lb

## 2024-11-12 DIAGNOSIS — E1165 Type 2 diabetes mellitus with hyperglycemia: Secondary | ICD-10-CM

## 2024-11-12 DIAGNOSIS — Z794 Long term (current) use of insulin: Secondary | ICD-10-CM | POA: Diagnosis not present

## 2024-11-12 LAB — POCT GLYCOSYLATED HEMOGLOBIN (HGB A1C): Hemoglobin A1C: 8.2 % — AB (ref 4.0–5.6)

## 2024-11-12 NOTE — Progress Notes (Signed)
 "  Outpatient Endocrinology Note Ciclaly Mulcahey, MD  11/12/2024  Patient Name: Samuel [name]   DOB : 1951-01-10 MRN # [id number]                                                    REASON OF VISIT: Follow up for type 2 diabetes mellitus  PCP: Dorina Loving, PA-C  HISTORY OF PRESENT ILLNESS:   Samuel [name] is a 74 y.o. old male with past medical history listed below, is here for follow up for type 2 diabetes mellitus.   Pertinent Diabetes History: _Diagnosed as type 2 in 1990.  Patient was initially treated with metformin  and later insulin  added.  He was mostly on insulin  70/30 regimen in the past.  At some point he was also on basal bolus insulin  with Toujeo  and Humalog .  Lately he has been on insulin  pump therapy.  OmniPod Dash insulin  pump started in 2018/2019.  Chronic Diabetes Complications : Retinopathy: no. Last ophthalmology exam was done on annually, reportedly. Nephropathy: no, on losartan  Peripheral neuropathy: no Coronary artery disease: no Stroke: no  Relevant comorbidities and cardiovascular risk factors: Obesity: yes Body mass index is 33.08 kg/m.  Hypertension: Yes Hyperlipidemia.  Yes, on statin.  Current / Home Diabetic regimen includes: Omnipod 4 / Dash /not with sensor.  He uses Humalog  U100.  Synjardy  03-999 mg 2 tab daily.  Trulicity  4.5 mg weekly.  Insulin  Pump setting:  Basal ( 28.8 units/day) MN-    0.9 u/hour 2:30AM-  0.9 8AM-    1.4   10 PM- 1.0       Bolus CHO Ratio (1unit:CHO) MN- 1:1 ( using fixed manual bolus 8-16 units)  Correction/Sensitivity: MN- 1:25  Target: 130   Active insulin  time: 4 hours  Prior diabetic medications: Insulin  70/30, Toujeo   INSULIN  PUMP DATA:                         Average total daily insulin : 48 units, Basal: 56%, Bolus: 44 %  He does not use sensor with OmniPod.  He does not do carb counting.  He uses fixed dose bolus from 6 to 16 units depending upon the blood sugar and food size with meals, 1-4  times a day.   Glycemic data:    CONTINUOUS GLUCOSE MONITORING SYSTEM (CGMS) INTERPRETATION:  FreeStyle Libre 2 CGM-  Sensor Download (Sensor download was reviewed and summarized below.) Dates: December 23 to January 5 , 2025, 14 days. Sensor Average: 147 Glucose Management Indicator: 6.8%  % data captured: 95%    Interpretation: Mostly acceptable blood sugar with occasional hyperglycemia with blood sugar up to 300 range in the evening and some time in the late morning.  No concerning hypoglycemia.  Overnight blood sugar mostly acceptable.  Hypoglycemia: Patient has no concerning hypoglycemic episodes. Patient has hypoglycemia awareness.    Factors modifying glucose control: 1.  Diabetic diet assessment: Almost always eats a bedtime snack.  Supper around 6 PM.  2.  Staying active or exercising: Occasionally walk.  3.  Medication compliance: compliant all of the time.  Interval history Insulin  pump and CGM data as reviewed above.  Hemoglobin A1c worsened to 8.2%.  GMI on CGM 6.8%.  Patient reports he running out of the pods, using 1 mL of insulin  and changing pod every almost  2 days.  Discussed about keeping 2 mL of insulin  into the pod which will be sufficient for him for 3 days.  Patient is accompanied by daughter in the clinic today.  He has also been taking Trulicity  and Synjardy  and tolerating well, denies any symptoms.  No other complaints today.  REVIEW OF SYSTEMS As per history of present illness.   PAST MEDICAL HISTORY: Past Medical History:  Diagnosis Date   Arthritis    Blood transfusion without reported diagnosis    Diabetes mellitus without complication (HCC)    type II   History of ETOH abuse    Hypertension     PAST SURGICAL HISTORY: Past Surgical History:  Procedure Laterality Date   BACK SURGERY N/A 11/08/2008   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   Procedure: LEFT TOTAL KNEE ARTHROPLASTY;  Surgeon: Vernetta Lonni GRADE, MD;  Location: [facility]  ORS;  Service: Orthopedics;  Laterality: Left;   TOTAL KNEE ARTHROPLASTY Right 11/15/2023   Procedure: RIGHT TOTAL KNEE ARTHROPLASTY;  Surgeon: Vernetta Lonni GRADE, MD;  Location: [facility] OR;  Service: Orthopedics;  Laterality: Right;    ALLERGIES: No Known Allergies  FAMILY HISTORY:  Family History  Problem Relation Age of Onset   Arthritis Father    Diabetes Father    Diabetes Sister    Diabetes Other    Hypertension Other    Diabetes Child     SOCIAL HISTORY: Social History   Socioeconomic History   Marital status: Married    Spouse name: Not on file   Number of children: Not on file   Years of education: Not on file   Highest education level: 3rd grade  Occupational History   Occupation: retired    Comment: maintenanc  Tobacco Use   Smoking status: Former   Smokeless tobacco: Former    Quit date: 11/08/1993  Vaping Use   Vaping status: Former  Substance and Sexual Activity   Alcohol use: Yes    Comment: occasional   Drug use: No   Sexual activity: Not on file  Other Topics Concern   Not on file  Social History Narrative   Not on file   Social Drivers of Health   Tobacco Use: Medium Risk (11/12/2024)   Patient History    Smoking Tobacco Use: Former    Smokeless Tobacco Use: Former    Passive Exposure: Not on Actuary Strain: Low Risk (05/23/2024)   Overall Financial Resource Strain (CARDIA)    Difficulty of Paying Living Expenses: Not hard at all  Food Insecurity: No Food Insecurity (05/23/2024)   Epic    Worried About Programme Researcher, Broadcasting/film/video in the Last Year: Never true    Ran Out of Food in the Last Year: Never true  Transportation Needs: No Transportation Needs (05/23/2024)   Epic    Lack of Transportation (Medical): No    Lack of Transportation (Non-Medical): No  Physical Activity: Insufficiently Active (05/23/2024)   Exercise Vital Sign    Days of Exercise per Week: 2 days    Minutes of Exercise per Session: 20 min  Stress: No Stress  Concern Present (05/23/2024)   [employer] of Occupational Health - Occupational Stress Questionnaire    Feeling of Stress: Not at all  Social Connections: Socially Integrated (05/23/2024)   Social Connection and Isolation Panel    Frequency of Communication with Friends and Family: More than three times a week    Frequency of Social Gatherings with Friends and Family: Twice a week  Attends Religious Services: 1 to 4 times per year    Active Member of Clubs or Organizations: Yes    Attends Banker Meetings: 1 to 4 times per year    Marital Status: Married  Depression (PHQ2-9): Low Risk (08/31/2024)   Depression (PHQ2-9)    PHQ-2 Score: 0  Alcohol Screen: Low Risk (05/23/2024)   Alcohol Screen    Last Alcohol Screening Score (AUDIT): 0  Housing: Low Risk (05/23/2024)   Epic    Unable to Pay for Housing in the Last Year: No    Number of Times Moved in the Last Year: 0    Homeless in the Last Year: No  Utilities: Not At Risk (05/23/2024)   Epic    Threatened with loss of utilities: No  Health Literacy: Adequate Health Literacy (05/23/2024)   B1300 Health Literacy    Frequency of need for help with medical instructions: Never    MEDICATIONS:  Current Outpatient Medications  Medication Sig Dispense Refill   albuterol  (VENTOLIN  HFA) 108 (90 Base) MCG/ACT inhaler Inhale 2 puffs into the lungs every 6 (six) hours as needed for wheezing. 18 g 0   aspirin  81 MG chewable tablet Chew 1 tablet (81 mg total) by mouth 2 (two) times daily. 35 tablet 0   atorvastatin  (LIPITOR) 40 MG tablet 1 tab po q day 90 tablet 3   B-D UF III MINI PEN NEEDLES 31G X 5 MM MISC USE FIVE PER DAY AS DIRECTED 200 each 0   Blood Glucose Monitoring Suppl (ONETOUCH VERIO REFLECT) w/Device KIT PLEASE USE WITH GLUCOMETER MACHINE TO CHECK BLOOD GLUCOSE LEVELS AS NEEDED, FOR BACKUP OF CGM 1 kit 0   Continuous Glucose Receiver (FREESTYLE LIBRE 2 READER) DEVI Use with sensor to track blood glucose 1 each 0    Continuous Glucose Sensor (FREESTYLE LIBRE 2 PLUS SENSOR) MISC Change sensor every 15  days. 6 each 3   Dulaglutide  (TRULICITY ) 4.5 MG/0.5ML SOAJ Inject 4.5 mg as directed once a week. 6 mL 3   Empagliflozin -metFORMIN  HCl ER (SYNJARDY  XR) 03-999 MG TB24 Take 2 tablets by mouth daily. 180 tablet 3   fluticasone  (FLONASE ) 50 MCG/ACT nasal spray Place 2 sprays into both nostrils daily. 16 g 1   Glucagon  (GVOKE HYPOPEN  1-PACK) 1 MG/0.2ML SOAJ Inject 1 mg into the skin as needed (low blood sugar with impaired consciousness). 0.4 mL 2   glucose blood (FREESTYLE LITE) test strip USE AS INSTRUCTED FOUR TIMES DAILY 150 strip 2   glucose blood test strip Use as instructed 100 each 12   Insulin  Disposable Pump (OMNIPOD DASH INTRO, GEN 4,) KIT Change every 3 days 1 kit 0   Insulin  Disposable Pump (OMNIPOD DASH PODS, GEN 4,) MISC USE FOR INSULIN  PUMP EVEY 3 DAYS 30 each 5   insulin  lispro (HUMALOG ) 100 UNIT/ML injection Max daily 125 units per pump 100 mL 3   Insulin  Pen Needle 32G X 4 MM MISC Use 5 pens per day to inject insulin  200 each 3   Insulin  Syringe-Needle U-100 (INSULIN  SYRINGE .5CC/30GX5/16) 30G X 5/16 0.5 ML MISC Use 3 times a day for insulin  300 each 1   levocetirizine (XYZAL ) 5 MG tablet Take 1 tablet (5 mg total) by mouth every evening. 30 tablet 3   levocetirizine (XYZAL ) 5 MG tablet Take 1 tablet (5 mg total) by mouth every evening. 90 tablet 3   losartan  (COZAAR ) 50 MG tablet TAKE 1 TABLET(50 MG) BY MOUTH DAILY 90 tablet 1   losartan  (COZAAR )  50 MG tablet Take 1 tablet (50 mg total) by mouth daily. 90 tablet 3   methocarbamol  (ROBAXIN ) 500 MG tablet Take 1 tablet (500 mg total) by mouth every 6 (six) hours as needed for muscle spasms. 40 tablet 1   montelukast  (SINGULAIR ) 10 MG tablet Take 1 tablet (10 mg total) by mouth at bedtime. 30 tablet 0   OVER THE COUNTER MEDICATION Take 2 tablets by mouth daily. 4LIFE TRANSFER FACTORY RECALL     primidone  (MYSOLINE ) 50 MG tablet Take 1 tablet (50  mg total) by mouth 2 (two) times daily. 180 tablet 1   tobramycin  (TOBREX ) 0.3 % ophthalmic solution Place 2 drops into the left eye every 6 (six) hours. 5 mL 0   No current facility-administered medications for this visit.    PHYSICAL EXAM: Vitals:   11/12/24 1444  BP: 128/64  Pulse: 72  Resp: 16  SpO2: 97%  Weight: 198 lb 12.8 oz (90.2 kg)  Height: 5' 5 (1.651 m)     Body mass index is 33.08 kg/m.  Wt Readings from Last 3 Encounters:  11/12/24 198 lb 12.8 oz (90.2 kg)  09/17/24 197 lb 12.8 oz (89.7 kg)  08/31/24 198 lb 6.4 oz (90 kg)    General: Well developed, well nourished male in no apparent distress.  HEENT: AT/[place], no external lesions.  Eyes: Conjunctiva clear and no icterus. Neck: Neck supple  Lungs: Respirations not labored Neurologic: Alert, oriented, normal speech Extremities / Skin: Dry.   Psychiatric: Does not appear depressed or anxious  Diabetic Foot Exam - Simple   No data filed    LABS Reviewed Lab Results  Component Value Date   HGBA1C 8.2 (A) 11/12/2024   HGBA1C 7.3 (A) 08/02/2024   HGBA1C 7.6 (A) 04/24/2024   Lab Results  Component Value Date   FRUCTOSAMINE 302 (H) 07/20/2017   Lab Results  Component Value Date   CHOL 98 08/02/2024   HDL 44 08/02/2024   LDLCALC 34 08/02/2024   LDLDIRECT 44.0 01/24/2020   TRIG 114 08/02/2024   CHOLHDL 2.2 08/02/2024   Lab Results  Component Value Date   MICRALBCREAT NOTE 08/02/2024    Lab Results  Component Value Date   CREATININE 0.77 08/02/2024   Lab Results  Component Value Date   GFR 86.83 06/22/2023    ASSESSMENT / PLAN  1. Uncontrolled type 2 diabetes mellitus with hyperglycemia, with long-term current use of insulin  (HCC)    Diagnoses and all orders for this visit:  Uncontrolled type 2 diabetes mellitus with hyperglycemia, with long-term current use of insulin  (HCC) -     POCT glycosylated hemoglobin (Hb A1C)   Diabetes Mellitus type 2  - Diabetic status / severity:  uncontrolled.  Lab Results  Component Value Date   HGBA1C 8.2 (A) 11/12/2024    - Hemoglobin A1c goal < 7% range.   GMI on CGM 6.8%.  - Medications:   OmniPod Dash using Humalog  U100.  -No change on the pump setting today.  Discussed about adjusting fixed bolus of insulin  for his meals 6 to 16 units based on meal size and blood sugar normal at that time. He does not carb count.  Advised to take bolus insulin  less by 2 units when he has increased physical activity for example walking.  -Continue Synjardy  03-999 mg 2 tab daily.   -Continue Trulicity  4.5 mg weekly.  Advised to fill port with 2 mL of Humalog  U100.  He has been currently feeling 1 mL and not having  enough insulin  for 3 days and getting sort of pods, due to early change.  - Home glucose testing: continue CGM and check blood glucose as needed.   - Discussed/ Gave Hypoglycemia treatment plan.  # Consult : not required at this time.   # Annual urine for microalbuminuria/ creatinine ratio, no microalbuminuria currently, continue losartan  /Synjardy .    Last  Lab Results  Component Value Date   MICRALBCREAT NOTE 08/02/2024     # Foot check nightly.  # Annual dilated diabetic eye exams.   - Diet: Make healthy diabetic food choices, advised to avoid high carbohydrate meal and sweets and sugary meals. - Life style / activity / exercise: Discussed.  Advised for walking and increase physical activity.  Blood pressure  -  BP Readings from Last 1 Encounters:  11/12/24 128/64    - Control is in target.  - No change in current plans.  Lipid status / Hyperlipidemia - Last  Lab Results  Component Value Date   LDLCALC 34 08/02/2024   - Continue atorvastatin  40 mg daily.   DISPOSITION Follow up in clinic in 3 months suggested.     All questions answered and patient verbalized understanding of the plan.  Constantine Ruddick, MD [facility] [address] [place], [address] [place], [place] [address] Phone # [phone]  At least part of this note was generated using voice recognition software. Inadvertent word errors may have occurred, which were not recognized during the proofreading process. "

## 2024-11-27 NOTE — Progress Notes (Unsigned)
 "   Assessment/Plan:    Assessment and Plan Assessment & Plan Essential tremor Chronic essential tremor, partially controlled on primidone  100 mg daily with 60% symptom improvement. No adverse effects reported. Dose escalation discussed and agreed upon to optimize control. - Increased primidone  to 150 mg daily (100 mg morning, 50 mg night) for one week, then 200 mg daily (100 mg BID). - Sent updated prescription to pharmacy. - Instructed to monitor for adverse effects and report concerns. - Provided guidance on titration schedule and dose increase rationale.  Diabetic peripheral neuropathy -Following with primary care.  He reports diabetes under better control now that he has a glucose monitor   Subjective:   Discussed the use of AI scribe software for clinical note transcription with the patient, who gave verbal consent to proceed.  History of Present Illness Samuel [name] is a 74 year old right-handed male with essential tremor who presents for follow-up of tremor management.  Medical translator is present.  INTERVAL HISTORY: He is currently taking primidone  50 mg in the morning and 50 mg at night, totaling 100 mg daily, for essential tremor. He takes two pills daily at consistent times, though not on a strict schedule. He reports approximately 60% improvement in tremor severity with this regimen. On some days, the medication provides adequate tremor control, but on other days, he continues to experience significant tremor despite adherence. The tremor may recur during the day but eventually subsides. He denies any side effects from primidone  and took his morning dose on the day of the visit.  He was previously started on primidone  and has not experienced any side effects or complications with the medication.    Current prescribed movement disorder medications: Primidone , 50 mg twice per day.   PREVIOUS MEDICATIONS: Primidone   ALLERGIES:  No Known Allergies  CURRENT  MEDICATIONS:  Current Meds  Medication Sig   albuterol  (VENTOLIN  HFA) 108 (90 Base) MCG/ACT inhaler Inhale 2 puffs into the lungs every 6 (six) hours as needed for wheezing.   aspirin  81 MG chewable tablet Chew 1 tablet (81 mg total) by mouth 2 (two) times daily.   atorvastatin  (LIPITOR) 40 MG tablet 1 tab po q day   B-D UF III MINI PEN NEEDLES 31G X 5 MM MISC USE FIVE PER DAY AS DIRECTED   Blood Glucose Monitoring Suppl (ONETOUCH VERIO REFLECT) w/Device KIT PLEASE USE WITH GLUCOMETER MACHINE TO CHECK BLOOD GLUCOSE LEVELS AS NEEDED, FOR BACKUP OF CGM   Continuous Glucose Receiver (FREESTYLE LIBRE 2 READER) DEVI Use with sensor to track blood glucose   Continuous Glucose Sensor (FREESTYLE LIBRE 2 PLUS SENSOR) MISC Change sensor every 15  days.   Dulaglutide  (TRULICITY ) 4.5 MG/0.5ML SOAJ Inject 4.5 mg as directed once a week.   Empagliflozin -metFORMIN  HCl ER (SYNJARDY  XR) 03-999 MG TB24 Take 2 tablets by mouth daily.   fluticasone  (FLONASE ) 50 MCG/ACT nasal spray Place 2 sprays into both nostrils daily.   Glucagon  (GVOKE HYPOPEN  1-PACK) 1 MG/0.2ML SOAJ Inject 1 mg into the skin as needed (low blood sugar with impaired consciousness).   glucose blood (FREESTYLE LITE) test strip USE AS INSTRUCTED FOUR TIMES DAILY   glucose blood test strip Use as instructed   Insulin  Disposable Pump (OMNIPOD DASH INTRO, GEN 4,) KIT Change every 3 days   Insulin  Disposable Pump (OMNIPOD DASH PODS, GEN 4,) MISC USE FOR INSULIN  PUMP EVEY 3 DAYS   insulin  lispro (HUMALOG ) 100 UNIT/ML injection Max daily 125 units per pump   Insulin  Pen Needle 32G X  4 MM MISC Use 5 pens per day to inject insulin    Insulin  Syringe-Needle U-100 (INSULIN  SYRINGE .5CC/30GX5/16) 30G X 5/16 0.5 ML MISC Use 3 times a day for insulin    levocetirizine (XYZAL ) 5 MG tablet Take 1 tablet (5 mg total) by mouth every evening.   levocetirizine (XYZAL ) 5 MG tablet Take 1 tablet (5 mg total) by mouth every evening.   losartan  (COZAAR ) 50 MG tablet  TAKE 1 TABLET(50 MG) BY MOUTH DAILY   losartan  (COZAAR ) 50 MG tablet Take 1 tablet (50 mg total) by mouth daily.   methocarbamol  (ROBAXIN ) 500 MG tablet Take 1 tablet (500 mg total) by mouth every 6 (six) hours as needed for muscle spasms.   montelukast  (SINGULAIR ) 10 MG tablet Take 1 tablet (10 mg total) by mouth at bedtime.   OVER THE COUNTER MEDICATION Take 2 tablets by mouth daily. 4LIFE TRANSFER FACTORY RECALL   tobramycin  (TOBREX ) 0.3 % ophthalmic solution Place 2 drops into the left eye every 6 (six) hours.   [DISCONTINUED] primidone  (MYSOLINE ) 50 MG tablet Take 1 tablet (50 mg total) by mouth 2 (two) times daily.      Objective:    PHYSICAL EXAMINATION:    VITALS:   Vitals:   11/29/24 1437  BP: 129/75  Pulse: (!) 59  SpO2: 98%  Weight: 200 lb 12.8 oz (91.1 kg)    GEN:  The patient appears stated age and is in NAD. HEENT:  Normocephalic, atraumatic.  The mucous membranes are moist. The superficial temporal arteries are without ropiness or tenderness. CV: Bradycardic.  Regular. Lungs:  CTAB Neck/HEME:  There are no carotid bruits bilaterally.  Neurological examination:  Orientation: The patient is alert and oriented x3. Cranial nerves: There is good facial symmetry.  Cannot adequately assess fluency/clarity of speech as he does not speak my language.  Soft palate rises symmetrically and there is no tongue deviation. Hearing is intact to conversational tone. Sensation: Sensation is intact to light touch throughout Motor: Strength is at least antigravity x4.  Movement examination: Tone: There is normal tone in the UE/LE Abnormal movements: He has no rest tremor today.  He has minimal postural tremor.  He has mild intention tremor bilaterally.  He does have tremor when given a weight.  Tremor is evident with Archimedes spirals bilaterally, but it is better than last visit.  I have reviewed and interpreted the following labs independently   Chemistry      Component  Value Date/Time   NA 140 08/02/2024 1531   K 4.2 08/02/2024 1531   CL 101 08/02/2024 1531   CO2 30 08/02/2024 1531   BUN 18 08/02/2024 1531   CREATININE 0.77 08/02/2024 1531      Component Value Date/Time   CALCIUM  9.5 08/02/2024 1531   ALKPHOS 52 07/28/2023 1530   AST 27 07/28/2023 1530   ALT 30 07/28/2023 1530   BILITOT 0.9 07/28/2023 1530      Lab Results  Component Value Date   WBC 8.7 11/16/2023   HGB 13.5 11/16/2023   HCT 39.3 11/16/2023   MCV 88.7 11/16/2023   PLT 173 11/16/2023   Lab Results  Component Value Date   TSH 1.94 11/26/2022     Chemistry      Component Value Date/Time   NA 140 08/02/2024 1531   K 4.2 08/02/2024 1531   CL 101 08/02/2024 1531   CO2 30 08/02/2024 1531   BUN 18 08/02/2024 1531   CREATININE 0.77 08/02/2024 1531  Component Value Date/Time   CALCIUM  9.5 08/02/2024 1531   ALKPHOS 52 07/28/2023 1530   AST 27 07/28/2023 1530   ALT 30 07/28/2023 1530   BILITOT 0.9 07/28/2023 1530         Cc:  Dorina Loving, PA-C   "

## 2024-11-29 ENCOUNTER — Encounter: Payer: Self-pay | Admitting: Neurology

## 2024-11-29 ENCOUNTER — Ambulatory Visit: Admitting: Neurology

## 2024-11-29 VITALS — BP 129/75 | HR 59 | Wt 200.8 lb

## 2024-11-29 DIAGNOSIS — E1142 Type 2 diabetes mellitus with diabetic polyneuropathy: Secondary | ICD-10-CM | POA: Diagnosis not present

## 2024-11-29 DIAGNOSIS — G250 Essential tremor: Secondary | ICD-10-CM | POA: Diagnosis not present

## 2024-11-29 MED ORDER — PRIMIDONE 50 MG PO TABS: 100.0000 mg | ORAL_TABLET | Freq: Two times a day (BID) | ORAL | 1 refills | Status: AC

## 2024-11-29 NOTE — Patient Instructions (Addendum)
" °  VISIT SUMMARY: Samuel [name] is a 74 year old male who came in for a follow-up appointment to manage his essential tremor. He is currently taking primidone  and has experienced a 60% improvement in his symptoms, though he still has some days with significant tremor.  YOUR PLAN: -ESSENTIAL TREMOR: Essential tremor is a nervous system disorder that causes involuntary and rhythmic shaking. Your tremor is partially controlled with your current dose of primidone . We have decided to increase your dose to 150 mg daily (100 mg in the morning and 50 mg at night) for one week, and then to 200 mg daily (100 mg twice a day). Please monitor for any side effects and report any concerns. We have sent the updated prescription to your pharmacy and provided guidance on the new dosing schedule.  INSTRUCTIONS: Please follow the new dosing schedule for primidone  as discussed. Increase to 150 mg daily for one week, then to 200 mg daily. Monitor for any side effects and report any concerns.  RESUMEN DE LA CONSULTA: [name] es un hombre de 73 aos que acudi a una cita de seguimiento para el control de su temblor esencial. Actualmente toma primidona y ha experimentado una mejora del 60% en sus sntomas, aunque todava presenta temblores significativos [employer].  PLAN DE TRATAMIENTO: - TEMBLOR ESENCIAL: El temblor esencial es un trastorno del sistema nervioso que causa temblores involuntarios y rtmicos. Su temblor est parcialmente controlado con la dosis actual de primidona. Hemos decidido aumentar su dosis a 150 mg diarios (100 mg por la maana y 50 mg por la noche) durante una semana, y luego a 200 mg diarios (100 mg consolidated edison). Por favor, est atento a cualquier efecto secundario e infrmenos de cualquier inquietud. Hemos enviado la receta actualizada a su farmacia y le hemos proporcionado instrucciones sobre el nuevo esquema de dosificacin.  INSTRUCCIONES: Siga el nuevo esquema de dosificacin de  primidona segn lo indicado. Aumente la dosis a 150 mg diarios durante una semana y luego a 200 mg diarios. Est atento a cualquier efecto secundario e infrmenos de cualquier inquietud.    Contains text generated by [employer]bridge.   "

## 2024-12-05 ENCOUNTER — Other Ambulatory Visit: Payer: Self-pay | Admitting: Medical

## 2025-02-18 ENCOUNTER — Ambulatory Visit: Admitting: Endocrinology

## 2025-05-29 ENCOUNTER — Ambulatory Visit

## 2025-06-06 ENCOUNTER — Ambulatory Visit: Payer: Self-pay | Admitting: Neurology
